# Patient Record
Sex: Male | Born: 1950 | Race: White | Hispanic: No | Marital: Married | Smoking: Former smoker
Health system: Southern US, Community
[De-identification: ages and names within clinical notes are randomized; demographics above are authoritative.]

## PROBLEM LIST (undated history)

## (undated) DIAGNOSIS — F32A Depression, unspecified: Secondary | ICD-10-CM

## (undated) DIAGNOSIS — E785 Hyperlipidemia, unspecified: Secondary | ICD-10-CM

## (undated) DIAGNOSIS — M1990 Unspecified osteoarthritis, unspecified site: Secondary | ICD-10-CM

## (undated) DIAGNOSIS — L570 Actinic keratosis: Secondary | ICD-10-CM

## (undated) DIAGNOSIS — I2510 Atherosclerotic heart disease of native coronary artery without angina pectoris: Secondary | ICD-10-CM

## (undated) DIAGNOSIS — F329 Major depressive disorder, single episode, unspecified: Secondary | ICD-10-CM

## (undated) DIAGNOSIS — C4490 Unspecified malignant neoplasm of skin, unspecified: Secondary | ICD-10-CM

## (undated) HISTORY — DX: Unspecified malignant neoplasm of skin, unspecified: C44.90

## (undated) HISTORY — DX: Hyperlipidemia, unspecified: E78.5

## (undated) HISTORY — PX: CORONARY ANGIOPLASTY: SHX604

## (undated) HISTORY — PX: ANKLE FRACTURE SURGERY: SHX122

## (undated) HISTORY — PX: EYE SURGERY: SHX253

## (undated) HISTORY — DX: Actinic keratosis: L57.0

---

## 2004-09-20 ENCOUNTER — Ambulatory Visit: Payer: Self-pay | Admitting: Orthopedic Surgery

## 2006-05-01 ENCOUNTER — Ambulatory Visit: Payer: Self-pay | Admitting: Cardiovascular Disease

## 2006-05-29 ENCOUNTER — Encounter: Payer: Self-pay | Admitting: Cardiovascular Disease

## 2006-06-17 ENCOUNTER — Encounter: Payer: Self-pay | Admitting: Cardiovascular Disease

## 2006-07-18 ENCOUNTER — Encounter: Payer: Self-pay | Admitting: Cardiovascular Disease

## 2007-12-18 ENCOUNTER — Ambulatory Visit: Payer: Self-pay | Admitting: Gastroenterology

## 2007-12-22 ENCOUNTER — Ambulatory Visit: Payer: Self-pay | Admitting: Gastroenterology

## 2008-01-22 ENCOUNTER — Ambulatory Visit: Payer: Self-pay | Admitting: Orthopaedic Surgery

## 2008-01-29 ENCOUNTER — Ambulatory Visit: Payer: Self-pay | Admitting: Orthopaedic Surgery

## 2008-02-03 ENCOUNTER — Ambulatory Visit: Payer: Self-pay | Admitting: Orthopaedic Surgery

## 2011-11-21 DIAGNOSIS — M1710 Unilateral primary osteoarthritis, unspecified knee: Secondary | ICD-10-CM | POA: Insufficient documentation

## 2011-12-18 DIAGNOSIS — M1710 Unilateral primary osteoarthritis, unspecified knee: Secondary | ICD-10-CM | POA: Insufficient documentation

## 2015-02-18 ENCOUNTER — Other Ambulatory Visit: Payer: Self-pay | Admitting: Urology

## 2015-03-07 DIAGNOSIS — N529 Male erectile dysfunction, unspecified: Secondary | ICD-10-CM | POA: Insufficient documentation

## 2015-04-25 ENCOUNTER — Other Ambulatory Visit: Payer: Self-pay | Admitting: Family Medicine

## 2015-04-26 ENCOUNTER — Encounter: Payer: Self-pay | Admitting: *Deleted

## 2015-04-26 ENCOUNTER — Ambulatory Visit
Admission: RE | Admit: 2015-04-26 | Discharge: 2015-04-26 | Disposition: A | Discharge status: Home / Self Care | Payer: BC Managed Care – PPO | Source: Ambulatory Visit | Attending: Cardiovascular Disease | Admitting: Cardiovascular Disease

## 2015-04-26 ENCOUNTER — Encounter
Admission: RE | Disposition: A | Discharge status: Home / Self Care | Payer: Self-pay | Source: Ambulatory Visit | Attending: Cardiovascular Disease

## 2015-04-26 DIAGNOSIS — Z79899 Other long term (current) drug therapy: Secondary | ICD-10-CM | POA: Diagnosis not present

## 2015-04-26 DIAGNOSIS — I10 Essential (primary) hypertension: Secondary | ICD-10-CM | POA: Diagnosis not present

## 2015-04-26 DIAGNOSIS — Z7902 Long term (current) use of antithrombotics/antiplatelets: Secondary | ICD-10-CM | POA: Insufficient documentation

## 2015-04-26 DIAGNOSIS — I259 Chronic ischemic heart disease, unspecified: Secondary | ICD-10-CM | POA: Diagnosis not present

## 2015-04-26 DIAGNOSIS — Z9889 Other specified postprocedural states: Secondary | ICD-10-CM | POA: Diagnosis not present

## 2015-04-26 DIAGNOSIS — Z791 Long term (current) use of non-steroidal anti-inflammatories (NSAID): Secondary | ICD-10-CM | POA: Insufficient documentation

## 2015-04-26 DIAGNOSIS — E785 Hyperlipidemia, unspecified: Secondary | ICD-10-CM | POA: Insufficient documentation

## 2015-04-26 DIAGNOSIS — Z87891 Personal history of nicotine dependence: Secondary | ICD-10-CM | POA: Diagnosis not present

## 2015-04-26 DIAGNOSIS — Z8249 Family history of ischemic heart disease and other diseases of the circulatory system: Secondary | ICD-10-CM | POA: Diagnosis not present

## 2015-04-26 HISTORY — PX: CARDIAC CATHETERIZATION: SHX172

## 2015-04-26 HISTORY — DX: Major depressive disorder, single episode, unspecified: F32.9

## 2015-04-26 HISTORY — DX: Atherosclerotic heart disease of native coronary artery without angina pectoris: I25.10

## 2015-04-26 HISTORY — DX: Depression, unspecified: F32.A

## 2015-04-26 SURGERY — LEFT HEART CATH
Anesthesia: Moderate Sedation

## 2015-04-26 MED ORDER — FENTANYL CITRATE (PF) 100 MCG/2ML IJ SOLN
INTRAMUSCULAR | Status: DC | PRN
  Administered 2015-04-26 (×2): 50 ug via INTRAVENOUS

## 2015-04-26 MED ORDER — MIDAZOLAM HCL 2 MG/2ML IJ SOLN
INTRAMUSCULAR | Status: AC
  Filled 2015-04-26: qty 2

## 2015-04-26 MED ORDER — SODIUM CHLORIDE 0.9 % IV SOLN: 250.0000 mL | INTRAVENOUS | Status: DC | PRN

## 2015-04-26 MED ORDER — ASPIRIN 81 MG PO CHEW: 81.0000 mg | CHEWABLE_TABLET | ORAL | Status: DC

## 2015-04-26 MED ORDER — MIDAZOLAM HCL 2 MG/2ML IJ SOLN
INTRAMUSCULAR | Status: DC | PRN
  Administered 2015-04-26 (×2): 1 mg via INTRAVENOUS

## 2015-04-26 MED ORDER — HEPARIN (PORCINE) IN NACL 2-0.9 UNIT/ML-% IJ SOLN
INTRAMUSCULAR | Status: AC
  Filled 2015-04-26: qty 1000

## 2015-04-26 MED ORDER — FENTANYL CITRATE (PF) 100 MCG/2ML IJ SOLN
INTRAMUSCULAR | Status: AC
  Filled 2015-04-26: qty 2

## 2015-04-26 MED ORDER — SODIUM CHLORIDE 0.9 % IV SOLN
INTRAVENOUS | Status: DC
Start: 2015-04-26 — End: 2015-04-26
  Administered 2015-04-26: 13:00:00 via INTRAVENOUS

## 2015-04-26 MED ORDER — SODIUM CHLORIDE 0.9 % IJ SOLN: 3.0000 mL | INTRAMUSCULAR | Status: DC | PRN

## 2015-04-26 MED ORDER — IOHEXOL 300 MG/ML  SOLN
INTRAMUSCULAR | Status: DC | PRN
  Administered 2015-04-26: 125 mL via INTRA_ARTERIAL

## 2015-04-26 MED ORDER — LIDOCAINE HCL (PF) 1 % IJ SOLN
INTRAMUSCULAR | Status: DC | PRN
  Administered 2015-04-26: 30 mL

## 2015-04-26 MED ORDER — SODIUM CHLORIDE 0.9 % IJ SOLN: 3.0000 mL | Freq: Two times a day (BID) | INTRAMUSCULAR | Status: DC

## 2015-04-26 SURGICAL SUPPLY — 9 items
CATH INFINITI 5FR ANG PIGTAIL (CATHETERS) ×3 IMPLANT
CATH INFINITI 5FR JL4 (CATHETERS) ×3 IMPLANT
CATH INFINITI JR4 5F (CATHETERS) ×3 IMPLANT
DEVICE CLOSURE MYNXGRIP 5F (Vascular Products) ×3 IMPLANT
KIT MANI 3VAL PERCEP (MISCELLANEOUS) ×3 IMPLANT
NEEDLE PERC 18GX7CM (NEEDLE) ×3 IMPLANT
PACK CARDIAC CATH (CUSTOM PROCEDURE TRAY) ×3 IMPLANT
SHEATH PINNACLE 5F 10CM (SHEATH) ×3 IMPLANT
WIRE EMERALD 3MM-J .035X150CM (WIRE) ×3 IMPLANT

## 2015-04-26 NOTE — Discharge Instructions (Signed)

## 2015-04-27 ENCOUNTER — Encounter: Payer: Self-pay | Admitting: Cardiovascular Disease

## 2015-05-21 ENCOUNTER — Other Ambulatory Visit: Payer: Self-pay | Admitting: Family Medicine

## 2015-05-21 MED ORDER — TESTOSTERONE CYPIONATE 200 MG/ML IM SOLN: 200.0000 mg | INTRAMUSCULAR | Status: DC

## 2015-05-22 ENCOUNTER — Other Ambulatory Visit: Payer: Self-pay | Admitting: Family Medicine

## 2015-05-22 MED ORDER — TESTOSTERONE CYPIONATE 200 MG/ML IM SOLN: 200.0000 mg | INTRAMUSCULAR | Status: DC

## 2015-06-17 ENCOUNTER — Other Ambulatory Visit: Payer: Self-pay | Admitting: Urology

## 2015-07-11 ENCOUNTER — Telehealth: Payer: Self-pay | Admitting: Family Medicine

## 2015-07-11 NOTE — Telephone Encounter (Signed)
Patient requesting lab results from CPE in April

## 2015-07-11 NOTE — Telephone Encounter (Signed)
Pt called stated he needs information for his annual enrollment forms for insurance. Please call him ASAP. Thanks.

## 2015-08-19 ENCOUNTER — Other Ambulatory Visit: Payer: Self-pay | Admitting: Family Medicine

## 2015-09-23 ENCOUNTER — Other Ambulatory Visit: Payer: Self-pay | Admitting: Family Medicine

## 2015-09-23 NOTE — Telephone Encounter (Signed)
apt 

## 2015-10-04 ENCOUNTER — Encounter: Payer: Self-pay | Admitting: Family Medicine

## 2015-10-17 ENCOUNTER — Other Ambulatory Visit: Payer: Self-pay | Admitting: Family Medicine

## 2015-10-17 ENCOUNTER — Other Ambulatory Visit: Payer: Self-pay | Admitting: Urology

## 2015-10-17 DIAGNOSIS — N400 Enlarged prostate without lower urinary tract symptoms: Secondary | ICD-10-CM

## 2015-10-17 NOTE — Telephone Encounter (Signed)
I have left messages for this patient to schedule as well as sent a letter to him on 10/04/15.

## 2015-10-17 NOTE — Telephone Encounter (Signed)
apt 

## 2015-10-17 NOTE — Telephone Encounter (Signed)
Patient is requesting a refill on his dutasteride.  It is okay to refill x 1, but he will need an appointment in March.

## 2015-10-19 NOTE — Telephone Encounter (Signed)
Medication sent to pt pharmacy 

## 2015-10-21 ENCOUNTER — Encounter: Payer: Self-pay | Admitting: Family Medicine

## 2015-11-10 ENCOUNTER — Ambulatory Visit: Payer: Self-pay | Admitting: Family Medicine

## 2015-11-17 ENCOUNTER — Other Ambulatory Visit: Payer: Self-pay | Admitting: Urology

## 2015-11-17 ENCOUNTER — Encounter: Payer: Self-pay | Admitting: Family Medicine

## 2015-11-17 ENCOUNTER — Telehealth: Payer: Self-pay | Admitting: Urology

## 2015-11-17 ENCOUNTER — Ambulatory Visit (INDEPENDENT_AMBULATORY_CARE_PROVIDER_SITE_OTHER): Payer: BC Managed Care – PPO | Admitting: Family Medicine

## 2015-11-17 VITALS — BP 167/92 | HR 75 | Temp 98.6°F | Ht 72.2 in | Wt 230.0 lb

## 2015-11-17 DIAGNOSIS — F329 Major depressive disorder, single episode, unspecified: Secondary | ICD-10-CM

## 2015-11-17 DIAGNOSIS — I2510 Atherosclerotic heart disease of native coronary artery without angina pectoris: Secondary | ICD-10-CM | POA: Insufficient documentation

## 2015-11-17 DIAGNOSIS — Z113 Encounter for screening for infections with a predominantly sexual mode of transmission: Secondary | ICD-10-CM | POA: Diagnosis not present

## 2015-11-17 DIAGNOSIS — E785 Hyperlipidemia, unspecified: Secondary | ICD-10-CM | POA: Insufficient documentation

## 2015-11-17 DIAGNOSIS — E291 Testicular hypofunction: Secondary | ICD-10-CM | POA: Insufficient documentation

## 2015-11-17 DIAGNOSIS — I10 Essential (primary) hypertension: Secondary | ICD-10-CM | POA: Diagnosis not present

## 2015-11-17 DIAGNOSIS — F32A Depression, unspecified: Secondary | ICD-10-CM

## 2015-11-17 LAB — LP+ALT+AST PICCOLO, WAIVED
ALT (SGPT) PICCOLO, WAIVED: 35 U/L (ref 10–47)
AST (SGOT) PICCOLO, WAIVED: 25 U/L (ref 11–38)
Chol/HDL Ratio Piccolo,Waive: 3.7 mg/dL
Cholesterol Piccolo, Waived: 109 mg/dL (ref <200)
HDL Chol Piccolo, Waived: 30 mg/dL — ABNORMAL LOW (ref >59)
LDL Chol Calc Piccolo Waived: 10 mg/dL (ref <100)
TRIGLYCERIDES PICCOLO,WAIVED: 347 mg/dL — AB (ref <150)
VLDL CHOL CALC PICCOLO,WAIVE: 69 mg/dL — AB (ref <30)

## 2015-11-17 MED ORDER — AMLODIPINE BESYLATE 10 MG PO TABS: 10.0000 mg | ORAL_TABLET | Freq: Every day | ORAL | Status: DC

## 2015-11-17 MED ORDER — VARENICLINE TARTRATE 1 MG PO TABS: 1.0000 mg | ORAL_TABLET | Freq: Two times a day (BID) | ORAL | Status: DC

## 2015-11-17 MED ORDER — VARENICLINE TARTRATE 0.5 MG X 11 & 1 MG X 42 PO MISC: ORAL | Status: DC

## 2015-11-17 NOTE — Telephone Encounter (Signed)
Patient will need an office visit for his yearly exam.  He may have enough refills for the Avodart until his appointment.

## 2015-11-17 NOTE — Assessment & Plan Note (Signed)
Discussed poor control hypertension we will increase amlodipine to 10 mg patient education given

## 2015-11-17 NOTE — Assessment & Plan Note (Signed)
The current medical regimen is effective;  continue present plan and medications.  

## 2015-11-17 NOTE — Assessment & Plan Note (Signed)
Followed by urology needing labs drawn which were done today

## 2015-11-17 NOTE — Progress Notes (Signed)
BP 167/92 mmHg  Pulse 75  Temp(Src) 98.6 F (37 C)  Ht 6' 0.2" (1.834 m)  Wt 230 lb (104.327 kg)  BMI 31.02 kg/m2  SpO2 97%   Subjective:    Patient ID: Kenneth Pruitt, male    DOB: 1950/10/01, 65 y.o.   MRN: [id number]  HPI: Kenneth Pruitt is a 65 y.o. male  Chief Complaint  Patient presents with  . Hypertension  . Hyperlipidemia  . Nicotine Dependence   Patient under a great deal of family stress with elevated blood pressure Taking medications faithfully without side effects Cholesterol doing well with no complaints from medications Getting testosterone replacement injections through urology. Needs lab work drawn today for urologist  Relevant past medical, surgical, family and social history reviewed and updated as indicated. Interim medical history since our last visit reviewed. Allergies and medications reviewed and updated.  Review of Systems  Constitutional: Negative.   Respiratory: Negative.   Cardiovascular: Negative.     Per HPI unless specifically indicated above     Objective:    BP 167/92 mmHg  Pulse 75  Temp(Src) 98.6 F (37 C)  Ht 6' 0.2" (1.834 m)  Wt 230 lb (104.327 kg)  BMI 31.02 kg/m2  SpO2 97%  Wt Readings from Last 3 Encounters:  11/17/15 230 lb (104.327 kg)  12/20/14 225 lb (102.059 kg)  04/26/15 215 lb (97.523 kg)    Physical Exam  Constitutional: He is oriented to person, place, and time. He appears well-developed and well-nourished. No distress.  HENT:  Head: Normocephalic and atraumatic.  Right Ear: Hearing normal.  Left Ear: Hearing normal.  Nose: Nose normal.  Eyes: Conjunctivae and lids are normal. Right eye exhibits no discharge. Left eye exhibits no discharge. No scleral icterus.  Cardiovascular: Normal rate, regular rhythm and normal heart sounds.   Pulmonary/Chest: Effort normal and breath sounds normal. No respiratory distress.  Musculoskeletal: Normal range of motion.  Neurological: He is alert and oriented to  person, place, and time.  Skin: Skin is intact. No rash noted.  Psychiatric: He has a normal mood and affect. His speech is normal and behavior is normal. Judgment and thought content normal. Cognition and memory are normal.    Results for orders placed or performed in visit on 11/17/15  LP+ALT+AST Piccolo, [employer]  Result Value Ref Range   ALT (SGPT) Piccolo, Waived 35 10 - 47 U/L   AST (SGOT) Piccolo, Waived 25 11 - 38 U/L   Cholesterol Piccolo, Waived 109 <200 mg/dL   HDL Chol Piccolo, Waived 30 (L) >59 mg/dL   Triglycerides Piccolo,Waived 347 (H) <150 mg/dL   Chol/HDL Ratio Piccolo,Waive 3.7 mg/dL   LDL Chol Calc Piccolo Waived 10 <100 mg/dL   VLDL Chol Calc Piccolo,Waive 69 (H) <30 mg/dL      Assessment & Plan:   Problem List Items Addressed This Visit      Cardiovascular and Mediastinum   Essential hypertension    Discussed poor control hypertension we will increase amlodipine to 10 mg patient education given      Relevant Medications   aspirin EC 81 MG tablet   amLODipine (NORVASC) 10 MG tablet   Coronary artery disease    The current medical regimen is effective;  continue present plan and medications.       Relevant Medications   aspirin EC 81 MG tablet   amLODipine (NORVASC) 10 MG tablet     Endocrine   Hypogonadism in male    Followed by urology  needing labs drawn which were done today      Relevant Orders   CBC with Differential/Platelet   PSA   Testosterone     Other   Hyperlipidemia    The current medical regimen is effective;  continue present plan and medications.       Relevant Medications   aspirin EC 81 MG tablet   amLODipine (NORVASC) 10 MG tablet   Other Relevant Orders   LP+ALT+AST Piccolo, Waived (Completed)   Depression    Stable on current medications will continue       Other Visit Diagnoses    Essential hypertension, benign    -  Primary    Relevant Medications    aspirin EC 81 MG tablet    amLODipine (NORVASC) 10 MG  tablet    Other Relevant Orders    Basic metabolic panel    Routine screening for STI (sexually transmitted infection)        Relevant Orders    Hepatitis C Antibody        Follow up plan: Return in about 2 months (around 01/17/2016) for Physical Exam.

## 2015-11-17 NOTE — Assessment & Plan Note (Signed)
Stable on current medications will continue  

## 2015-11-18 LAB — PSA
PROSTATE SPECIFIC AG, SERUM: 2 ng/mL (ref 0.0–4.0)
Prostate Specific Ag, Serum: 2 ng/mL (ref 0.0–4.0)

## 2015-11-18 LAB — TESTOSTERONE
TESTOSTERONE: 919 ng/dL (ref 348–1197)
Testosterone: 949 ng/dL (ref 348–1197)

## 2015-11-18 LAB — BASIC METABOLIC PANEL
BUN / CREAT RATIO: 17 (ref 10–22)
BUN: 14 mg/dL (ref 8–27)
CHLORIDE: 99 mmol/L (ref 96–106)
CO2: 24 mmol/L (ref 18–29)
Calcium: 9.5 mg/dL (ref 8.6–10.2)
Creatinine, Ser: 0.84 mg/dL (ref 0.76–1.27)
GFR calc non Af Amer: 93 mL/min/{1.73_m2} (ref >59)
GFR, EST AFRICAN AMERICAN: 107 mL/min/{1.73_m2} (ref >59)
Glucose: 97 mg/dL (ref 65–99)
POTASSIUM: 3.9 mmol/L (ref 3.5–5.2)
Sodium: 141 mmol/L (ref 134–144)

## 2015-11-18 LAB — HEPATITIS C ANTIBODY: Hep C Virus Ab: <0.1 s/co ratio (ref 0.0–0.9)

## 2015-11-18 NOTE — Telephone Encounter (Signed)
Spoke with pt in reference to refills of Advodart. Pt stated that he has changed providers but it must have been his pharmacy sending a refill. Reinforced with pt should let his pharmacy know hes changed providers. Pt voiced understanding.

## 2015-11-20 LAB — CBC WITH DIFFERENTIAL/PLATELET

## 2015-11-21 ENCOUNTER — Encounter: Payer: Self-pay | Admitting: Family Medicine

## 2015-12-14 ENCOUNTER — Ambulatory Visit (INDEPENDENT_AMBULATORY_CARE_PROVIDER_SITE_OTHER): Payer: BC Managed Care – PPO | Admitting: Family Medicine

## 2015-12-14 ENCOUNTER — Encounter: Payer: Self-pay | Admitting: Family Medicine

## 2015-12-14 VITALS — BP 160/80 | HR 80 | Temp 98.6°F | Ht 72.6 in | Wt 228.0 lb

## 2015-12-14 DIAGNOSIS — E291 Testicular hypofunction: Secondary | ICD-10-CM

## 2015-12-14 DIAGNOSIS — I10 Essential (primary) hypertension: Secondary | ICD-10-CM | POA: Diagnosis not present

## 2015-12-14 DIAGNOSIS — F329 Major depressive disorder, single episode, unspecified: Secondary | ICD-10-CM | POA: Diagnosis not present

## 2015-12-14 DIAGNOSIS — F32A Depression, unspecified: Secondary | ICD-10-CM

## 2015-12-14 MED ORDER — HYDROCHLOROTHIAZIDE 12.5 MG PO CAPS: 12.5000 mg | ORAL_CAPSULE | Freq: Every day | ORAL | Status: DC

## 2015-12-14 MED ORDER — TESTOSTERONE CYPIONATE 200 MG/ML IM SOLN: INTRAMUSCULAR | Status: DC

## 2015-12-14 NOTE — Progress Notes (Signed)
BP 160/80 mmHg  Pulse 80  Temp(Src) 98.6 F (37 C)  Ht 6' 0.6" (1.844 m)  Wt 228 lb (103.42 kg)  BMI 30.41 kg/m2  SpO2 98%   Subjective:    Patient ID: Kenneth Pruitt, male    DOB: Sep 02, 1951, 65 y.o.   MRN: [id number]  HPI: Kenneth Pruitt is a 65 y.o. male  Chief Complaint  Patient presents with  . low testosterone   patient with long-term low testosterone has been due to insurance hassles going to [facility] for urology care and testosterone prescription blood work is been done here for the urologist. Patient ready to transfer ongoing blood work and testosterone prescription to hear will see urologist once a year for maintenance of testosterone and testosterone prescription. Patient doing well on 0.75 mL of 200 mg/cc injections every 10 days seems to maintain his testosterone well.  Patient under great deal of stress with family issues blood pressure sores at times hasn't been checking at home.   Relevant past medical, surgical, family and social history reviewed and updated as indicated. Interim medical history since our last visit reviewed. Allergies and medications reviewed and updated.  Review of Systems  Constitutional: Negative.   Respiratory: Negative.   Cardiovascular: Negative.     Per HPI unless specifically indicated above     Objective:    BP 160/80 mmHg  Pulse 80  Temp(Src) 98.6 F (37 C)  Ht 6' 0.6" (1.844 m)  Wt 228 lb (103.42 kg)  BMI 30.41 kg/m2  SpO2 98%  Wt Readings from Last 3 Encounters:  12/14/15 228 lb (103.42 kg)  11/17/15 230 lb (104.327 kg)  12/20/14 225 lb (102.059 kg)    Physical Exam  Constitutional: He is oriented to person, place, and time. He appears well-developed and well-nourished. No distress.  HENT:  Head: Normocephalic and atraumatic.  Right Ear: Hearing normal.  Left Ear: Hearing normal.  Nose: Nose normal.  Eyes: Conjunctivae and lids are normal. Right eye exhibits no discharge. Left eye exhibits no discharge. No  scleral icterus.  Cardiovascular: Normal rate, regular rhythm and normal heart sounds.   Pulmonary/Chest: Effort normal and breath sounds normal. No respiratory distress.  Musculoskeletal: Normal range of motion.  Neurological: He is alert and oriented to person, place, and time.  Skin: Skin is intact. No rash noted.  Psychiatric: He has a normal mood and affect. His speech is normal and behavior is normal. Judgment and thought content normal. Cognition and memory are normal.    Results for orders placed or performed in visit on 11/17/15  LP+ALT+AST Piccolo, [employer]  Result Value Ref Range   ALT (SGPT) Piccolo, Waived 35 10 - 47 U/L   AST (SGOT) Piccolo, Waived 25 11 - 38 U/L   Cholesterol Piccolo, Waived 109 <200 mg/dL   HDL Chol Piccolo, Waived 30 (L) >59 mg/dL   Triglycerides Piccolo,Waived 347 (H) <150 mg/dL   Chol/HDL Ratio Piccolo,Waive 3.7 mg/dL   LDL Chol Calc Piccolo Waived 10 <100 mg/dL   VLDL Chol Calc Piccolo,Waive 69 (H) <30 mg/dL  Basic metabolic panel  Result Value Ref Range   Glucose 97 65 - 99 mg/dL   BUN 14 8 - 27 mg/dL   Creatinine, Ser 0.84 0.76 - 1.27 mg/dL   GFR calc non Af Amer 93 >59 mL/min/1.73   GFR calc Af Amer 107 >59 mL/min/1.73   BUN/Creatinine Ratio 17 10 - 22   Sodium 141 134 - 144 mmol/L   Potassium 3.9 3.5 -  5.2 mmol/L   Chloride 99 96 - 106 mmol/L   CO2 24 18 - 29 mmol/L   Calcium 9.5 8.6 - 10.2 mg/dL  Hepatitis C Antibody  Result Value Ref Range   Hep C Virus Ab <0.1 0.0 - 0.9 s/co ratio  CBC with Differential/Platelet  Result Value Ref Range   WBC CANCELED x10E3/uL   RBC CANCELED    Hemoglobin CANCELED    Hematocrit CANCELED    Platelets CANCELED    Neutrophils CANCELED    Lymphs CANCELED    Monocytes CANCELED    Eos CANCELED    Lymphocytes Absolute CANCELED    EOS (ABSOLUTE) CANCELED    Basophils Absolute CANCELED   PSA  Result Value Ref Range   Prostate Specific Ag, Serum 2.0 0.0 - 4.0 ng/mL  Testosterone  Result Value Ref  Range   Testosterone 919 348 - 1197 ng/dL   Comment, Testosterone Comment   Testosterone  Result Value Ref Range   Testosterone 949 348 - 1197 ng/dL   Comment, Testosterone Comment   Hepatitis C antibody  Result Value Ref Range   Hep C Virus Ab <0.1 0.0 - 0.9 s/co ratio  PSA  Result Value Ref Range   Prostate Specific Ag, Serum 2.0 0.0 - 4.0 ng/mL      Assessment & Plan:   Problem List Items Addressed This Visit      Cardiovascular and Mediastinum   Essential hypertension - Primary    Poor control will add hydrochlorothiazide 12.5 mg 1 a day checking blood pressure if not better will need to go to 25 mg as patient loses weight and begin to exercise program will hopefully be able to reduce dosing.      Relevant Medications   hydrochlorothiazide (MICROZIDE) 12.5 MG capsule     Endocrine   Hypogonadism in male    We'll continue testosterone replacement therapy checking labs CBC wasn't done for whatever reason a couple weeks ago will do CBC at patient's physical coming up in a few weeks.        Other   Depression    The current medical regimen is effective;  continue present plan and medications.           Follow up plan: Return for Physical Exam, As scheduled.

## 2015-12-14 NOTE — Assessment & Plan Note (Signed)
We'll continue testosterone replacement therapy checking labs CBC wasn't done for whatever reason a couple weeks ago will do CBC at patient's physical coming up in a few weeks.

## 2015-12-14 NOTE — Assessment & Plan Note (Signed)
The current medical regimen is effective;  continue present plan and medications.  

## 2015-12-14 NOTE — Assessment & Plan Note (Signed)
Poor control will add hydrochlorothiazide 12.5 mg 1 a day checking blood pressure if not better will need to go to 25 mg as patient loses weight and begin to exercise program will hopefully be able to reduce dosing.

## 2015-12-15 ENCOUNTER — Other Ambulatory Visit: Payer: Self-pay | Admitting: Family Medicine

## 2015-12-19 ENCOUNTER — Other Ambulatory Visit: Payer: Self-pay | Admitting: Family Medicine

## 2015-12-21 ENCOUNTER — Other Ambulatory Visit: Payer: Self-pay | Admitting: Family Medicine

## 2016-02-02 ENCOUNTER — Ambulatory Visit (INDEPENDENT_AMBULATORY_CARE_PROVIDER_SITE_OTHER): Payer: BC Managed Care – PPO | Admitting: Family Medicine

## 2016-02-02 ENCOUNTER — Encounter: Payer: Self-pay | Admitting: Family Medicine

## 2016-02-02 ENCOUNTER — Other Ambulatory Visit: Payer: Self-pay | Admitting: Family Medicine

## 2016-02-02 VITALS — BP 142/80 | HR 72 | Temp 98.0°F | Ht 71.0 in | Wt 226.2 lb

## 2016-02-02 DIAGNOSIS — E291 Testicular hypofunction: Secondary | ICD-10-CM | POA: Diagnosis not present

## 2016-02-02 DIAGNOSIS — E785 Hyperlipidemia, unspecified: Secondary | ICD-10-CM

## 2016-02-02 DIAGNOSIS — I2510 Atherosclerotic heart disease of native coronary artery without angina pectoris: Secondary | ICD-10-CM

## 2016-02-02 DIAGNOSIS — Z0000 Encounter for general adult medical examination without abnormal findings: Secondary | ICD-10-CM | POA: Diagnosis not present

## 2016-02-02 DIAGNOSIS — I10 Essential (primary) hypertension: Secondary | ICD-10-CM | POA: Diagnosis not present

## 2016-02-02 LAB — LP+ALT+AST PICCOLO, WAIVED
ALT (SGPT) PICCOLO, WAIVED: 33 U/L (ref 10–47)
AST (SGOT) PICCOLO, WAIVED: 33 U/L (ref 11–38)
CHOL/HDL RATIO PICCOLO,WAIVE: 3.5 mg/dL
CHOLESTEROL PICCOLO, WAIVED: 117 mg/dL (ref <200)
HDL Chol Piccolo, Waived: 33 mg/dL — ABNORMAL LOW (ref >59)
LDL Chol Calc Piccolo Waived: 53 mg/dL (ref <100)
Triglycerides Piccolo,Waived: 156 mg/dL — ABNORMAL HIGH (ref <150)
VLDL Chol Calc Piccolo,Waive: 31 mg/dL — ABNORMAL HIGH (ref <30)

## 2016-02-02 LAB — URINALYSIS, ROUTINE W REFLEX MICROSCOPIC
BILIRUBIN UA: NEGATIVE
GLUCOSE, UA: NEGATIVE
KETONES UA: NEGATIVE
Leukocytes, UA: NEGATIVE
NITRITE UA: NEGATIVE
RBC UA: NEGATIVE
Specific Gravity, UA: 1.015 (ref 1.005–1.030)
UUROB: 0.2 mg/dL (ref 0.2–1.0)
pH, UA: 6.5 (ref 5.0–7.5)

## 2016-02-02 MED ORDER — BENAZEPRIL HCL 40 MG PO TABS: 40.0000 mg | ORAL_TABLET | Freq: Every day | ORAL | Status: DC

## 2016-02-02 MED ORDER — VENLAFAXINE HCL ER 150 MG PO CP24: 150.0000 mg | ORAL_CAPSULE | Freq: Every day | ORAL | Status: DC

## 2016-02-02 MED ORDER — DUTASTERIDE 0.5 MG PO CAPS: 0.5000 mg | ORAL_CAPSULE | Freq: Every day | ORAL | Status: DC

## 2016-02-02 MED ORDER — TAMSULOSIN HCL 0.4 MG PO CAPS: 0.4000 mg | ORAL_CAPSULE | Freq: Every day | ORAL | Status: DC

## 2016-02-02 MED ORDER — ATORVASTATIN CALCIUM 10 MG PO TABS: 10.0000 mg | ORAL_TABLET | Freq: Every day | ORAL | Status: DC

## 2016-02-02 MED ORDER — MELOXICAM 15 MG PO TABS
15.0000 mg | ORAL_TABLET | Freq: Every day | ORAL | Status: DC
Start: 2016-02-02 — End: 2016-09-08

## 2016-02-02 MED ORDER — NIACIN ER (ANTIHYPERLIPIDEMIC) 1000 MG PO TBCR: 2000.0000 mg | EXTENDED_RELEASE_TABLET | Freq: Every day | ORAL | Status: DC

## 2016-02-02 MED ORDER — HYDROCHLOROTHIAZIDE 12.5 MG PO CAPS: 12.5000 mg | ORAL_CAPSULE | Freq: Every day | ORAL | Status: DC

## 2016-02-02 MED ORDER — HYDROCORTISONE 2.5 % RE CREA
1.0000 | TOPICAL_CREAM | Freq: Two times a day (BID) | RECTAL | Status: DC
Start: 2016-02-02 — End: 2016-08-14

## 2016-02-02 MED ORDER — AMLODIPINE BESYLATE 10 MG PO TABS: 10.0000 mg | ORAL_TABLET | Freq: Every day | ORAL | Status: DC

## 2016-02-02 NOTE — Progress Notes (Signed)
BP 142/80 mmHg  Pulse 72  Temp(Src) 98 F (36.7 C)  Ht 5\' 11"  (1.803 m)  Wt 226 lb 3.2 oz (102.604 kg)  BMI 31.56 kg/m2  SpO2 96%   Subjective:    Patient ID: Kenneth Pruitt, male    DOB: Mar 10, 1951, 65 y.o.   MRN: [id number]  HPI: Kenneth Pruitt is a 65 y.o. male  Chief Complaint  Patient presents with  . Annual Exam   Patient doing well with medications blood pressure may be some low blood pressure changes after exertion. No coronary symptoms as very physically active and does well.  Has quit cigars and doing well with Chantix. Nerves doing okay but a lot of stress with caregiver role of his mother. Testosterone replacement doing well. Giving three quarters of a cc every 10 days. Patient having still highs and lows on about day 8 starts feeling low has bad dreams. After shot on day 10 after 24 hours feels back to normal. Followed by urology and is due to his appointment.  Relevant past medical, surgical, family and social history reviewed and updated as indicated. Interim medical history since our last visit reviewed. Allergies and medications reviewed and updated.  Review of Systems  Constitutional: Negative.   HENT: Negative.   Eyes: Negative.   Respiratory: Negative.   Cardiovascular: Negative.   Gastrointestinal: Negative.   Endocrine: Negative.   Genitourinary: Negative.   Musculoskeletal: Negative.   Skin: Negative.   Allergic/Immunologic: Negative.   Neurological: Negative.   Hematological: Negative.   Psychiatric/Behavioral: Negative.     Per HPI unless specifically indicated above     Objective:    BP 142/80 mmHg  Pulse 72  Temp(Src) 98 F (36.7 C)  Ht 5\' 11"  (1.803 m)  Wt 226 lb 3.2 oz (102.604 kg)  BMI 31.56 kg/m2  SpO2 96%  Wt Readings from Last 3 Encounters:  02/02/16 226 lb 3.2 oz (102.604 kg)  12/14/15 228 lb (103.42 kg)  11/17/15 230 lb (104.327 kg)    Physical Exam  Constitutional: He is oriented to person, place, and time. He  appears well-developed and well-nourished.  HENT:  Head: Normocephalic and atraumatic.  Right Ear: External ear normal.  Left Ear: External ear normal.  Nose: Nose normal.  Eyes: Conjunctivae and EOM are normal. Pupils are equal, round, and reactive to light.  Neck: Normal range of motion. Neck supple. No thyromegaly present.  Cardiovascular: Normal rate, regular rhythm, normal heart sounds and intact distal pulses.   Pulmonary/Chest: Effort normal and breath sounds normal.  Abdominal: Soft. Bowel sounds are normal. There is no splenomegaly or hepatomegaly.  Musculoskeletal: Normal range of motion.  Lymphadenopathy:    He has no cervical adenopathy.  Neurological: He is alert and oriented to person, place, and time. He has normal reflexes.  Skin: Skin is warm and dry. No rash noted. No erythema.  Psychiatric: He has a normal mood and affect. His behavior is normal. Judgment and thought content normal.    Results for orders placed or performed in visit on 11/17/15  LP+ALT+AST Piccolo, [employer]  Result Value Ref Range   ALT (SGPT) Piccolo, Waived 35 10 - 47 U/L   AST (SGOT) Piccolo, Waived 25 11 - 38 U/L   Cholesterol Piccolo, Waived 109 <200 mg/dL   HDL Chol Piccolo, Waived 30 (L) >59 mg/dL   Triglycerides Piccolo,Waived 347 (H) <150 mg/dL   Chol/HDL Ratio Piccolo,Waive 3.7 mg/dL   LDL Chol Calc Piccolo Waived 10 <100 mg/dL  VLDL Chol Calc Piccolo,Waive 69 (H) <30 mg/dL  Basic metabolic panel  Result Value Ref Range   Glucose 97 65 - 99 mg/dL   BUN 14 8 - 27 mg/dL   Creatinine, Ser 0.84 0.76 - 1.27 mg/dL   GFR calc non Af Amer 93 >59 mL/min/1.73   GFR calc Af Amer 107 >59 mL/min/1.73   BUN/Creatinine Ratio 17 10 - 22   Sodium 141 134 - 144 mmol/L   Potassium 3.9 3.5 - 5.2 mmol/L   Chloride 99 96 - 106 mmol/L   CO2 24 18 - 29 mmol/L   Calcium 9.5 8.6 - 10.2 mg/dL  Hepatitis C Antibody  Result Value Ref Range   Hep C Virus Ab <0.1 0.0 - 0.9 s/co ratio  CBC with  Differential/Platelet  Result Value Ref Range   WBC CANCELED x10E3/uL   RBC CANCELED    Hemoglobin CANCELED    Hematocrit CANCELED    Platelets CANCELED    Neutrophils CANCELED    Lymphs CANCELED    Monocytes CANCELED    Eos CANCELED    Lymphocytes Absolute CANCELED    EOS (ABSOLUTE) CANCELED    Basophils Absolute CANCELED   PSA  Result Value Ref Range   Prostate Specific Ag, Serum 2.0 0.0 - 4.0 ng/mL  Testosterone  Result Value Ref Range   Testosterone 919 348 - 1197 ng/dL   Comment, Testosterone Comment   Testosterone  Result Value Ref Range   Testosterone 949 348 - 1197 ng/dL   Comment, Testosterone Comment   Hepatitis C antibody  Result Value Ref Range   Hep C Virus Ab <0.1 0.0 - 0.9 s/co ratio  PSA  Result Value Ref Range   Prostate Specific Ag, Serum 2.0 0.0 - 4.0 ng/mL      Assessment & Plan:   Problem List Items Addressed This Visit      Cardiovascular and Mediastinum   Coronary artery disease    Doing well no symptoms followed by cardiology      Relevant Medications   amLODipine (NORVASC) 10 MG tablet   atorvastatin (LIPITOR) 10 MG tablet   benazepril (LOTENSIN) 40 MG tablet   hydrochlorothiazide (MICROZIDE) 12.5 MG capsule   niacin (NIASPAN) 1000 MG CR tablet   Essential hypertension    Discussed patient hadn't taking blood pressure medicine today but getting low will drop amlodipine from 10-5 mg monitoring blood pressure.      Relevant Medications   amLODipine (NORVASC) 10 MG tablet   atorvastatin (LIPITOR) 10 MG tablet   benazepril (LOTENSIN) 40 MG tablet   hydrochlorothiazide (MICROZIDE) 12.5 MG capsule   niacin (NIASPAN) 1000 MG CR tablet     Endocrine   Hypogonadism in male    Discuss testosterone replacement may be shortening interval will do peak and trough levels on current dose and then reassess patient had testosterone shot approximate 24 hours or go we'll check testosterone today.      Relevant Orders   Testosterone     Other    Hyperlipidemia    The current medical regimen is effective;  continue present plan and medications.       Relevant Medications   amLODipine (NORVASC) 10 MG tablet   atorvastatin (LIPITOR) 10 MG tablet   benazepril (LOTENSIN) 40 MG tablet   hydrochlorothiazide (MICROZIDE) 12.5 MG capsule   niacin (NIASPAN) 1000 MG CR tablet    Other Visit Diagnoses    Routine general medical examination at a health care facility    -  Primary    Relevant Orders    CBC with Differential/Platelet    Comprehensive metabolic panel    TSH    Testosterone    PSA    Urinalysis, Routine w reflex microscopic (not at [facility])    [facility], Waived        Follow up plan: Return in about 6 months (around 08/04/2016) for bmp, lipids, alt, ast.

## 2016-02-02 NOTE — Assessment & Plan Note (Signed)
Doing well no symptoms followed by cardiology

## 2016-02-02 NOTE — Assessment & Plan Note (Signed)
Discuss testosterone replacement may be shortening interval will do peak and trough levels on current dose and then reassess patient had testosterone shot approximate 24 hours or go we'll check testosterone today.

## 2016-02-02 NOTE — Assessment & Plan Note (Signed)
Discussed patient hadn't taking blood pressure medicine today but getting low will drop amlodipine from 10-5 mg monitoring blood pressure.

## 2016-02-02 NOTE — Addendum Note (Signed)
Addended byGolden Pop on: 02/02/2016 10:15 AM   Modules accepted: Orders, SmartSet

## 2016-02-02 NOTE — Assessment & Plan Note (Signed)
The current medical regimen is effective;  continue present plan and medications.  

## 2016-02-03 LAB — CBC WITH DIFFERENTIAL/PLATELET
BASOS: 1 %
Basophils Absolute: 0 10*3/uL (ref 0.0–0.2)
EOS (ABSOLUTE): 0.2 10*3/uL (ref 0.0–0.4)
Eos: 4 %
HEMATOCRIT: 51 % (ref 37.5–51.0)
HEMOGLOBIN: 17.7 g/dL (ref 12.6–17.7)
IMMATURE GRANS (ABS): 0 10*3/uL (ref 0.0–0.1)
IMMATURE GRANULOCYTES: 0 %
LYMPHS: 31 %
Lymphocytes Absolute: 1.7 10*3/uL (ref 0.7–3.1)
MCH: 32.8 pg (ref 26.6–33.0)
MCHC: 34.7 g/dL (ref 31.5–35.7)
MCV: 94 fL (ref 79–97)
MONOCYTES: 9 %
Monocytes Absolute: 0.5 10*3/uL (ref 0.1–0.9)
NEUTROS ABS: 3 10*3/uL (ref 1.4–7.0)
NEUTROS PCT: 55 %
Platelets: 231 10*3/uL (ref 150–379)
RBC: 5.4 x10E6/uL (ref 4.14–5.80)
RDW: 13.4 % (ref 12.3–15.4)
WBC: 5.4 10*3/uL (ref 3.4–10.8)

## 2016-02-03 LAB — COMPREHENSIVE METABOLIC PANEL
A/G RATIO: 2.3 — AB (ref 1.2–2.2)
ALT: 21 IU/L (ref 0–44)
AST: 21 IU/L (ref 0–40)
Albumin: 4.3 g/dL (ref 3.6–4.8)
Alkaline Phosphatase: 49 IU/L (ref 39–117)
BUN/Creatinine Ratio: 27 — ABNORMAL HIGH (ref 10–24)
BUN: 25 mg/dL (ref 8–27)
Bilirubin Total: 0.8 mg/dL (ref 0.0–1.2)
CALCIUM: 8.9 mg/dL (ref 8.6–10.2)
CO2: 23 mmol/L (ref 18–29)
CREATININE: 0.94 mg/dL (ref 0.76–1.27)
Chloride: 101 mmol/L (ref 96–106)
GFR, EST AFRICAN AMERICAN: 99 mL/min/{1.73_m2} (ref >59)
GFR, EST NON AFRICAN AMERICAN: 85 mL/min/{1.73_m2} (ref >59)
Globulin, Total: 1.9 g/dL (ref 1.5–4.5)
Glucose: 112 mg/dL — ABNORMAL HIGH (ref 65–99)
POTASSIUM: 4 mmol/L (ref 3.5–5.2)
SODIUM: 140 mmol/L (ref 134–144)
TOTAL PROTEIN: 6.2 g/dL (ref 6.0–8.5)

## 2016-02-03 LAB — TESTOSTERONE: Testosterone: 1017 ng/dL (ref 348–1197)

## 2016-02-03 LAB — TSH: TSH: 1.27 u[IU]/mL (ref 0.450–4.500)

## 2016-02-03 LAB — PSA: Prostate Specific Ag, Serum: 2.2 ng/mL (ref 0.0–4.0)

## 2016-02-20 ENCOUNTER — Other Ambulatory Visit: Payer: Self-pay | Admitting: Family Medicine

## 2016-04-23 ENCOUNTER — Other Ambulatory Visit: Payer: Self-pay | Admitting: Family Medicine

## 2016-05-05 ENCOUNTER — Other Ambulatory Visit: Payer: Self-pay | Admitting: Family Medicine

## 2016-05-07 DIAGNOSIS — E785 Hyperlipidemia, unspecified: Secondary | ICD-10-CM | POA: Diagnosis not present

## 2016-05-07 DIAGNOSIS — I2510 Atherosclerotic heart disease of native coronary artery without angina pectoris: Secondary | ICD-10-CM | POA: Diagnosis not present

## 2016-05-07 DIAGNOSIS — Z955 Presence of coronary angioplasty implant and graft: Secondary | ICD-10-CM | POA: Diagnosis not present

## 2016-05-07 DIAGNOSIS — I10 Essential (primary) hypertension: Secondary | ICD-10-CM | POA: Diagnosis not present

## 2016-06-06 DIAGNOSIS — E785 Hyperlipidemia, unspecified: Secondary | ICD-10-CM | POA: Diagnosis not present

## 2016-06-18 ENCOUNTER — Other Ambulatory Visit: Payer: Self-pay

## 2016-06-18 MED ORDER — TESTOSTERONE CYPIONATE 200 MG/ML IM SOLN: INTRAMUSCULAR | 2 refills | Status: DC

## 2016-07-24 DIAGNOSIS — Z23 Encounter for immunization: Secondary | ICD-10-CM | POA: Diagnosis not present

## 2016-08-06 ENCOUNTER — Ambulatory Visit (INDEPENDENT_AMBULATORY_CARE_PROVIDER_SITE_OTHER): Payer: Medicare Other | Admitting: Family Medicine

## 2016-08-06 ENCOUNTER — Encounter: Payer: Self-pay | Admitting: Family Medicine

## 2016-08-06 VITALS — BP 169/89 | HR 80 | Temp 98.7°F | Wt 224.0 lb

## 2016-08-06 DIAGNOSIS — F3342 Major depressive disorder, recurrent, in full remission: Secondary | ICD-10-CM | POA: Diagnosis not present

## 2016-08-06 DIAGNOSIS — E291 Testicular hypofunction: Secondary | ICD-10-CM | POA: Diagnosis not present

## 2016-08-06 DIAGNOSIS — R972 Elevated prostate specific antigen [PSA]: Secondary | ICD-10-CM

## 2016-08-06 DIAGNOSIS — E782 Mixed hyperlipidemia: Secondary | ICD-10-CM | POA: Diagnosis not present

## 2016-08-06 DIAGNOSIS — I10 Essential (primary) hypertension: Secondary | ICD-10-CM

## 2016-08-06 DIAGNOSIS — I2510 Atherosclerotic heart disease of native coronary artery without angina pectoris: Secondary | ICD-10-CM

## 2016-08-06 DIAGNOSIS — M25561 Pain in right knee: Secondary | ICD-10-CM | POA: Diagnosis not present

## 2016-08-06 DIAGNOSIS — G8929 Other chronic pain: Secondary | ICD-10-CM | POA: Diagnosis not present

## 2016-08-06 LAB — LP+ALT+AST PICCOLO, WAIVED
ALT (SGPT) Piccolo, Waived: 23 U/L (ref 10–47)
AST (SGOT) Piccolo, Waived: 28 U/L (ref 11–38)
Chol/HDL Ratio Piccolo,Waive: 2.8 mg/dL
Cholesterol Piccolo, Waived: 115 mg/dL (ref <200)
HDL CHOL PICCOLO, WAIVED: 41 mg/dL — AB (ref >59)
LDL CHOL CALC PICCOLO WAIVED: 36 mg/dL (ref <100)
TRIGLYCERIDES PICCOLO,WAIVED: 188 mg/dL — AB (ref <150)
VLDL CHOL CALC PICCOLO,WAIVE: 38 mg/dL — AB (ref <30)

## 2016-08-06 NOTE — Assessment & Plan Note (Signed)
The current medical regimen is effective;  continue present plan and medications.  

## 2016-08-06 NOTE — Assessment & Plan Note (Signed)
Poor control will increase amlodipine patient was to take 5 mg twice a day and restart hydrochlorothiazide 12.5 on after blood pressure and notify me if not doing well.

## 2016-08-06 NOTE — Assessment & Plan Note (Signed)
Patient with arthroscopic surgery on his right with continued wobble and pain wants to go back and recheck with orthopedic surgery will make an appointment.

## 2016-08-06 NOTE — Assessment & Plan Note (Signed)
Continue current dosing doing well will check PSA and CBC

## 2016-08-06 NOTE — Progress Notes (Addendum)
BP (!) 169/89   Pulse 80   Temp 98.7 F (37.1 C)   Wt 224 lb (101.6 kg)   SpO2 97%   BMI 31.24 kg/m    Subjective:    Patient ID: Kenneth Pruitt, male    DOB: 10-16-1950, 65 y.o.   MRN: [id number]  HPI: Kenneth Pruitt is a 65 y.o. male  Chief Complaint  Patient presents with  . Hypertension  . Hyperlipidemia  Patient follow-up blood pressure has been taking amlodipine half a tablet 5 mg a day and no hydrochlorothiazide is during the summer his bike riding and sweating a lot was getting too low. Now with white running season over and not sweating blood pressures gone up. 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 Interim medical history since our last visit reviewed. Allergies and medications reviewed and updated.  Review of Systems  Constitutional: Negative.   Respiratory: Negative.   Cardiovascular: Negative.     Per HPI unless specifically indicated above     Objective:    BP (!) 169/89   Pulse 80   Temp 98.7 F (37.1 C)   Wt 224 lb (101.6 kg)   SpO2 97%   BMI 31.24 kg/m   Wt Readings from Last 3 Encounters:  08/06/16 224 lb (101.6 kg)  02/02/16 226 lb 3.2 oz (102.6 kg)  12/14/15 228 lb (103.4 kg)    Physical Exam  Constitutional: He is oriented to person, place, and time. He appears well-developed and well-nourished. No distress.  HENT:  Head: Normocephalic and atraumatic.  Right Ear: Hearing normal.  Left Ear: Hearing normal.  Nose: Nose normal.  Eyes: Conjunctivae and lids are normal. Right eye exhibits no discharge. Left eye exhibits no discharge. No scleral icterus.  Cardiovascular: Normal rate, regular rhythm and normal heart sounds.   Pulmonary/Chest: Effort normal and breath sounds normal. No respiratory distress.  Musculoskeletal: Normal range of motion.    Neurological: He is alert and oriented to person, place, and time.  Skin: Skin is intact. No rash noted.  Psychiatric: He has a normal mood and affect. His speech is normal and behavior is normal. Judgment and thought content normal. Cognition and memory are normal.    Results for orders placed or performed in visit on 02/02/16  CBC with Differential/Platelet  Result Value Ref Range   WBC 5.4 3.4 - 10.8 x10E3/uL   RBC 5.40 4.14 - 5.80 x10E6/uL   Hemoglobin 17.7 12.6 - 17.7 g/dL   Hematocrit 51.0 37.5 - 51.0 %   MCV 94 79 - 97 fL   MCH 32.8 26.6 - 33.0 pg   MCHC 34.7 31.5 - 35.7 g/dL   RDW 13.4 12.3 - 15.4 %   Platelets 231 150 - 379 x10E3/uL   Neutrophils 55 %   Lymphs 31 %   Monocytes 9 %   Eos 4 %   Basos 1 %   Neutrophils Absolute 3.0 1.4 - 7.0 x10E3/uL   Lymphocytes Absolute 1.7 0.7 - 3.1 x10E3/uL   Monocytes Absolute 0.5 0.1 - 0.9 x10E3/uL   EOS (ABSOLUTE) 0.2 0.0 - 0.4 x10E3/uL   Basophils Absolute 0.0 0.0 - 0.2 x10E3/uL   Immature Granulocytes 0 %   Immature Grans (Abs) 0.0 0.0 - 0.1 x10E3/uL  Comprehensive metabolic panel  Result Value Ref Range   Glucose 112 (H) 65 - 99 mg/dL  BUN 25 8 - 27 mg/dL   Creatinine, Ser 0.94 0.76 - 1.27 mg/dL   GFR calc non Af Amer 85 >59 mL/min/1.73   GFR calc Af Amer 99 >59 mL/min/1.73   BUN/Creatinine Ratio 27 (H) 10 - 24   Sodium 140 134 - 144 mmol/L   Potassium 4.0 3.5 - 5.2 mmol/L   Chloride 101 96 - 106 mmol/L   CO2 23 18 - 29 mmol/L   Calcium 8.9 8.6 - 10.2 mg/dL   Total Protein 6.2 6.0 - 8.5 g/dL   Albumin 4.3 3.6 - 4.8 g/dL   Globulin, Total 1.9 1.5 - 4.5 g/dL   Albumin/Globulin Ratio 2.3 (H) 1.2 - 2.2   Bilirubin Total 0.8 0.0 - 1.2 mg/dL   Alkaline Phosphatase 49 39 - 117 IU/L   AST 21 0 - 40 IU/L   ALT 21 0 - 44 IU/L  TSH  Result Value Ref Range   TSH 1.270 0.450 - 4.500 uIU/mL  Testosterone  Result Value Ref Range   Testosterone 1,017 348 - 1,197 ng/dL   Comment, Testosterone Comment   PSA  Result Value  Ref Range   Prostate Specific Ag, Serum 2.2 0.0 - 4.0 ng/mL  Urinalysis, Routine w reflex microscopic (not at [facility])  Result Value Ref Range   Specific Gravity, UA 1.015 1.005 - 1.030   pH, UA 6.5 5.0 - 7.5   Color, UA Yellow Yellow   Appearance Ur Clear Clear   Leukocytes, UA Negative Negative   Protein, UA Trace Negative/Trace   Glucose, UA Negative Negative   Ketones, UA Negative Negative   RBC, UA Negative Negative   Bilirubin, UA Negative Negative   Urobilinogen, Ur 0.2 0.2 - 1.0 mg/dL   Nitrite, UA Negative Negative  LP+ALT+AST Piccolo, Waived  Result Value Ref Range   ALT (SGPT) Piccolo, Waived 33 10 - 47 U/L   AST (SGOT) Piccolo, Waived 33 11 - 38 U/L   Cholesterol Piccolo, Waived 117 <200 mg/dL   HDL Chol Piccolo, Waived 33 (L) >59 mg/dL   Triglycerides Piccolo,Waived 156 (H) <150 mg/dL   Chol/HDL Ratio Piccolo,Waive 3.5 mg/dL   LDL Chol Calc Piccolo Waived 53 <100 mg/dL   VLDL Chol Calc Piccolo,Waive 31 (H) <30 mg/dL      Assessment & Plan:   Problem List Items Addressed This Visit      Cardiovascular and Mediastinum   Essential hypertension - Primary    Poor control will increase amlodipine patient was to take 5 mg twice a day and restart hydrochlorothiazide 12.5 on after blood pressure and notify me if not doing well.      Relevant Medications   rosuvastatin (CRESTOR) 10 MG tablet   benazepril (LOTENSIN) 40 MG tablet   Other Relevant Orders   Basic metabolic panel   CBC with Differential/Platelet     Endocrine   Hypogonadism in male    Continue current dosing doing well will check PSA and CBC      Relevant Orders   PSA   CBC with Differential/Platelet     Other   Depression    The current medical regimen is effective;  continue present plan and medications.       Hyperlipidemia    The current medical regimen is effective;  continue present plan and medications.       Relevant Medications   rosuvastatin (CRESTOR) 10 MG tablet   benazepril  (LOTENSIN) 40 MG tablet   Other Relevant Orders   LP+ALT+AST Piccolo, Waived  Knee pain, right    Patient with arthroscopic surgery on his right with continued wobble and pain wants to go back and recheck with orthopedic surgery will make an appointment.      Relevant Orders   Ambulatory referral to Orthopedic Surgery   Elevated PSA    Monitoring PSA because of on testosterone      Relevant Orders   PSA       Follow up plan: Return in about 6 months (around 02/03/2017) for Physical Exam.

## 2016-08-06 NOTE — Assessment & Plan Note (Signed)
Monitoring PSA because of on testosterone

## 2016-08-07 ENCOUNTER — Telehealth: Payer: Self-pay | Admitting: Family Medicine

## 2016-08-07 DIAGNOSIS — R718 Other abnormality of red blood cells: Secondary | ICD-10-CM | POA: Insufficient documentation

## 2016-08-07 LAB — BASIC METABOLIC PANEL
BUN / CREAT RATIO: 15 (ref 10–24)
BUN: 16 mg/dL (ref 8–27)
CO2: 22 mmol/L (ref 18–29)
CREATININE: 1.04 mg/dL (ref 0.76–1.27)
Calcium: 9.1 mg/dL (ref 8.6–10.2)
Chloride: 100 mmol/L (ref 96–106)
GFR calc non Af Amer: 75 mL/min/{1.73_m2} (ref >59)
GFR, EST AFRICAN AMERICAN: 87 mL/min/{1.73_m2} (ref >59)
Glucose: 130 mg/dL — ABNORMAL HIGH (ref 65–99)
Potassium: 3.9 mmol/L (ref 3.5–5.2)
Sodium: 140 mmol/L (ref 134–144)

## 2016-08-07 LAB — CBC WITH DIFFERENTIAL/PLATELET
BASOS: 1 %
Basophils Absolute: 0 10*3/uL (ref 0.0–0.2)
EOS (ABSOLUTE): 0.2 10*3/uL (ref 0.0–0.4)
Eos: 4 %
HEMOGLOBIN: 18.2 g/dL — AB (ref 12.6–17.7)
Hematocrit: 51.2 % — ABNORMAL HIGH (ref 37.5–51.0)
IMMATURE GRANS (ABS): 0 10*3/uL (ref 0.0–0.1)
Immature Granulocytes: 0 %
LYMPHS: 28 %
Lymphocytes Absolute: 1.4 10*3/uL (ref 0.7–3.1)
MCH: 32.5 pg (ref 26.6–33.0)
MCHC: 35.5 g/dL (ref 31.5–35.7)
MCV: 91 fL (ref 79–97)
MONOCYTES: 7 %
Monocytes Absolute: 0.3 10*3/uL (ref 0.1–0.9)
NEUTROS ABS: 3 10*3/uL (ref 1.4–7.0)
Neutrophils: 60 %
Platelets: 189 10*3/uL (ref 150–379)
RBC: 5.6 x10E6/uL (ref 4.14–5.80)
RDW: 13.4 % (ref 12.3–15.4)
WBC: 5 10*3/uL (ref 3.4–10.8)

## 2016-08-07 LAB — PSA: Prostate Specific Ag, Serum: 2 ng/mL (ref 0.0–4.0)

## 2016-08-07 NOTE — Assessment & Plan Note (Signed)
Secondary to testosterone replacement

## 2016-08-07 NOTE — Telephone Encounter (Signed)
Phone call Discussed with patient elevated hemoglobin area did reviewed reducing testosterone dosing from 0.75 mg every 10 days to 0.5 mg every 10 days will check peak and trough testosterone in 1 month or so by checking testosterone to 2 days after shot and then the day before getting his shot.  We'll also refer to hematology to further evaluate this patient relates he was donating blood to control his hemoglobin previousl but after starting [employer] did not want his blood anymore.

## 2016-08-14 ENCOUNTER — Other Ambulatory Visit: Payer: Self-pay | Admitting: Family Medicine

## 2016-08-14 NOTE — Telephone Encounter (Signed)
Routing to provider  

## 2016-08-23 DIAGNOSIS — M25462 Effusion, left knee: Secondary | ICD-10-CM | POA: Diagnosis not present

## 2016-08-23 DIAGNOSIS — M1710 Unilateral primary osteoarthritis, unspecified knee: Secondary | ICD-10-CM | POA: Diagnosis not present

## 2016-08-23 DIAGNOSIS — M25461 Effusion, right knee: Secondary | ICD-10-CM | POA: Diagnosis not present

## 2016-08-23 DIAGNOSIS — M25561 Pain in right knee: Secondary | ICD-10-CM | POA: Diagnosis not present

## 2016-08-28 DIAGNOSIS — D751 Secondary polycythemia: Secondary | ICD-10-CM | POA: Insufficient documentation

## 2016-08-28 NOTE — Progress Notes (Signed)
[place]  [phone] [phone]  ID: Kenneth Pruitt OB: [birth year]  MR#: [id number]  [id number]  Patient Care Team: Guadalupe Maple, MD as PCP - General (Family Medicine) Guadalupe Maple, MD as PCP - Family Medicine (Family Medicine) Brendolyn Patty, MD (Dermatology) Earnestine Leys, MD (Specialist) Dionisio David, MD as Consulting Physician (Cardiology) Albertine Patricia, DPM as Attending Physician (Podiatry)  CHIEF COMPLAINT: Secondary polycythemia.  INTERVAL HISTORY: Patient is a 65 year old male who has a persistently elevated hemoglobin secondary to testosterone injections. Previously, patient was able to donate blood at the [facility], but recently started new medication and is no longer allowed to donate. He is in clinic today for continuation of phlebotomy. He currently feels well and is asymptomatic. He does not complain of weakness or fatigue. He has no neurologic complaints. Patient denies any recent fevers or illnesses. He has a good appetite and denies weight loss. He has no chest pain or shortness of breath. He denies any nausea, vomiting, constipation, or diarrhea. He has no urinary complaints. Patient feels at his baseline and offers no specific complaints today.  REVIEW OF SYSTEMS:   Review of Systems  Constitutional: Negative.  Negative for fever, malaise/fatigue and weight loss.  Respiratory: Negative.  Negative for cough and shortness of breath.   Cardiovascular: Negative.  Negative for chest pain and leg swelling.  Gastrointestinal: Negative.  Negative for abdominal pain.  Genitourinary: Negative.   Musculoskeletal: Negative.   Neurological: Negative.   Psychiatric/Behavioral: Negative.  The patient is not nervous/anxious.     As per HPI. Otherwise, a complete review of systems is negative.  PAST MEDICAL HISTORY: Past Medical History:  Diagnosis Date  . Coronary artery disease   . Depression   . Hyperlipidemia      PAST SURGICAL HISTORY: Past Surgical History:  Procedure Laterality Date  . CARDIAC CATHETERIZATION N/A 04/26/2015   Procedure: Left Heart Cath;  Surgeon: Dionisio David, MD;  Location: [facility];  Service: Cardiovascular;  Laterality: N/A;  . CORONARY ANGIOPLASTY    . EYE SURGERY Bilateral    cataract    FAMILY HISTORY: Family History  Problem Relation Age of Onset  . Diabetes Mother   . Hypertension Mother   . Cancer Father     ADVANCED DIRECTIVES (Y/N):  N  HEALTH MAINTENANCE: Social History  Substance Use Topics  . Smoking status: Former Smoker    Years: 30.00    Types: Cigars  . Smokeless tobacco: Never Used  . Alcohol use No     Colonoscopy:  PAP:  Bone density:  Lipid panel:  No Known Allergies  Current Outpatient Prescriptions  Medication Sig Dispense Refill  . amLODipine (NORVASC) 10 MG tablet Take 1 tablet (10 mg total) by mouth daily. 30 tablet 12  . Cholecalciferol ([employer] VIT D 5000 HIGH-POTENCY) 5000 units capsule Take 5,000 Units by mouth daily.    . clopidogrel (PLAVIX) 75 MG tablet Take 75 mg by mouth daily.    Marland Kitchen dutasteride (AVODART) 0.5 MG capsule Take 1 capsule (0.5 mg total) by mouth daily. 30 capsule 12  . hydrochlorothiazide (MICROZIDE) 12.5 MG capsule Take 12.5 mg by mouth daily.    . meloxicam (MOBIC) 15 MG tablet Take 1 tablet (15 mg total) by mouth daily. 30 tablet 7  . niacin (NIASPAN) 1000 MG CR tablet Take 2 tablets (2,000 mg total) by mouth at bedtime. 60 tablet 12  . Omega-3 Fatty Acids (FISH OIL) 1000 MG CAPS Take 1,000  capsules by mouth.    . pantoprazole (PROTONIX) 40 MG tablet 40 mg daily.    . rosuvastatin (CRESTOR) 10 MG tablet Take 10 mg by mouth at bedtime.    . tamsulosin (FLOMAX) 0.4 MG CAPS capsule Take 1 capsule (0.4 mg total) by mouth daily. 30 capsule 12  . testosterone cypionate (DEPOTESTOSTERONE CYPIONATE) 200 MG/ML injection Inject 0.78ml intramuscular every 10 days 10 mL 2  . Turmeric 450 MG CAPS Take 1  capsule by mouth daily.    . valsartan (DIOVAN) 320 MG tablet TAKE ONE TABLET BY MOUTH EVERY DAY 30 tablet 12  . venlafaxine XR (EFFEXOR-XR) 150 MG 24 hr capsule Take 1 capsule (150 mg total) by mouth daily with breakfast. Reported on 11/17/2015 30 capsule 12  . vitamin B-12 (CYANOCOBALAMIN) 1000 MCG tablet Take 1,000 mcg by mouth daily.     No current facility-administered medications for this visit.     OBJECTIVE: Vitals:   08/29/16 1142  BP: (!) 176/89  Pulse: 86  Resp: 18  Temp: (!) 96.6 F (35.9 C)     Body mass index is 31.78 kg/m.    ECOG FS:0 - Asymptomatic  General: Well-developed, well-nourished, no acute distress. Eyes: Pink conjunctiva, anicteric sclera. HEENT: Normocephalic, moist mucous membranes, clear oropharnyx. Lungs: Clear to auscultation bilaterally. Heart: Regular rate and rhythm. No rubs, murmurs, or gallops. Abdomen: Soft, nontender, nondistended. No organomegaly noted, normoactive bowel sounds. Musculoskeletal: No edema, cyanosis, or clubbing. Neuro: Alert, answering all questions appropriately. Cranial nerves grossly intact. Skin: No rashes or petechiae noted. Psych: Normal affect. Lymphatics: No cervical, calvicular, axillary or inguinal LAD.   LAB RESULTS:  Lab Results  Component Value Date   NA 140 08/06/2016   K 3.9 08/06/2016   CL 100 08/06/2016   CO2 22 08/06/2016   GLUCOSE 130 (H) 08/06/2016   BUN 16 08/06/2016   CREATININE 1.04 08/06/2016   CALCIUM 9.1 08/06/2016   PROT 6.2 02/02/2016   ALBUMIN 4.3 02/02/2016   AST 28 08/06/2016   ALT 23 08/06/2016   ALKPHOS 49 02/02/2016   BILITOT 0.8 02/02/2016   GFRNONAA 75 08/06/2016   GFRAA 87 08/06/2016    Lab Results  Component Value Date   WBC 5.0 08/06/2016   NEUTROABS 3.0 08/06/2016   HCT 51.2 (H) 08/06/2016   MCV 91 08/06/2016   PLT 189 08/06/2016     STUDIES: No results found.  ASSESSMENT: Secondary polycythemia  PLAN:    1. 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 Patient will return later this week for 500 mL of phlebotomy. He will then return to clinic in 3 months for lab and phlebotomy and then in 6 months for further evaluation. If patient is a never able to return to the blood donation Center, further follow-up can be canceled. 2. Hypogonadism: Continue testosterone as per urology. 3. Hypertension: Patient's blood pressure is elevated today, continue monitoring and treatment per primary care.  Patient expressed understanding and was in agreement with this plan. He also understands that He can call clinic at any time with any questions, concerns, or complaints.    Lloyd Huger, MD   09/01/2016 10:54 AM

## 2016-08-29 ENCOUNTER — Inpatient Hospital Stay: Payer: Medicare Other | Attending: Oncology | Admitting: Oncology

## 2016-08-29 VITALS — BP 176/89 | HR 86 | Temp 96.6°F | Resp 18 | Wt 227.8 lb

## 2016-08-29 DIAGNOSIS — E785 Hyperlipidemia, unspecified: Secondary | ICD-10-CM

## 2016-08-29 DIAGNOSIS — I2510 Atherosclerotic heart disease of native coronary artery without angina pectoris: Secondary | ICD-10-CM | POA: Diagnosis not present

## 2016-08-29 DIAGNOSIS — Z809 Family history of malignant neoplasm, unspecified: Secondary | ICD-10-CM | POA: Diagnosis not present

## 2016-08-29 DIAGNOSIS — Z87891 Personal history of nicotine dependence: Secondary | ICD-10-CM | POA: Diagnosis not present

## 2016-08-29 DIAGNOSIS — Z79899 Other long term (current) drug therapy: Secondary | ICD-10-CM

## 2016-08-29 DIAGNOSIS — E291 Testicular hypofunction: Secondary | ICD-10-CM

## 2016-08-29 DIAGNOSIS — I10 Essential (primary) hypertension: Secondary | ICD-10-CM | POA: Insufficient documentation

## 2016-08-29 DIAGNOSIS — D751 Secondary polycythemia: Secondary | ICD-10-CM | POA: Insufficient documentation

## 2016-08-29 NOTE — Progress Notes (Signed)
New evaluation for elevated hemoglobin. Offers no complaints.

## 2016-08-30 ENCOUNTER — Other Ambulatory Visit: Payer: Self-pay | Admitting: Oncology

## 2016-08-31 ENCOUNTER — Inpatient Hospital Stay: Payer: Medicare Other

## 2016-08-31 DIAGNOSIS — I10 Essential (primary) hypertension: Secondary | ICD-10-CM | POA: Diagnosis not present

## 2016-08-31 DIAGNOSIS — E291 Testicular hypofunction: Secondary | ICD-10-CM | POA: Diagnosis not present

## 2016-08-31 DIAGNOSIS — Z79899 Other long term (current) drug therapy: Secondary | ICD-10-CM | POA: Diagnosis not present

## 2016-08-31 DIAGNOSIS — E785 Hyperlipidemia, unspecified: Secondary | ICD-10-CM | POA: Diagnosis not present

## 2016-08-31 DIAGNOSIS — I2510 Atherosclerotic heart disease of native coronary artery without angina pectoris: Secondary | ICD-10-CM | POA: Diagnosis not present

## 2016-08-31 DIAGNOSIS — D751 Secondary polycythemia: Secondary | ICD-10-CM | POA: Diagnosis not present

## 2016-09-08 ENCOUNTER — Other Ambulatory Visit: Payer: Self-pay | Admitting: Family Medicine

## 2016-10-19 ENCOUNTER — Other Ambulatory Visit: Payer: Self-pay | Admitting: Family Medicine

## 2016-11-10 ENCOUNTER — Other Ambulatory Visit: Payer: Self-pay | Admitting: Family Medicine

## 2016-11-27 ENCOUNTER — Inpatient Hospital Stay: Payer: Medicare Other

## 2016-11-27 ENCOUNTER — Inpatient Hospital Stay: Payer: Medicare Other | Attending: Oncology

## 2016-11-27 VITALS — BP 119/77 | HR 96 | Temp 96.3°F | Resp 18

## 2016-11-27 DIAGNOSIS — E291 Testicular hypofunction: Secondary | ICD-10-CM | POA: Diagnosis not present

## 2016-11-27 DIAGNOSIS — D751 Secondary polycythemia: Secondary | ICD-10-CM

## 2016-11-27 DIAGNOSIS — Z79899 Other long term (current) drug therapy: Secondary | ICD-10-CM | POA: Insufficient documentation

## 2016-11-27 LAB — CBC WITH DIFFERENTIAL/PLATELET
BASOS ABS: 0 10*3/uL (ref 0–0.1)
Basophils Relative: 1 %
Eosinophils Absolute: 0.2 10*3/uL (ref 0–0.7)
Eosinophils Relative: 3 %
HEMATOCRIT: 54.7 % — AB (ref 40.0–52.0)
Hemoglobin: 19.1 g/dL — ABNORMAL HIGH (ref 13.0–18.0)
LYMPHS PCT: 23 %
Lymphs Abs: 1.4 10*3/uL (ref 1.0–3.6)
MCH: 32.7 pg (ref 26.0–34.0)
MCHC: 35 g/dL (ref 32.0–36.0)
MCV: 93.4 fL (ref 80.0–100.0)
MONO ABS: 0.6 10*3/uL (ref 0.2–1.0)
Monocytes Relative: 10 %
NEUTROS ABS: 4.1 10*3/uL (ref 1.4–6.5)
Neutrophils Relative %: 63 %
Platelets: 187 10*3/uL (ref 150–440)
RBC: 5.86 MIL/uL (ref 4.40–5.90)
RDW: 12.9 % (ref 11.5–14.5)
WBC: 6.4 10*3/uL (ref 3.8–10.6)

## 2016-11-27 NOTE — Progress Notes (Signed)
Hgb: 19.1. Hct: 54.7. MD, Dr. Grayland Ormond, notified via telephone. Per MD order: proceed with 536ml phlebotomy today.

## 2016-11-30 DIAGNOSIS — R0602 Shortness of breath: Secondary | ICD-10-CM | POA: Diagnosis not present

## 2016-11-30 DIAGNOSIS — I2510 Atherosclerotic heart disease of native coronary artery without angina pectoris: Secondary | ICD-10-CM | POA: Diagnosis not present

## 2016-11-30 DIAGNOSIS — E785 Hyperlipidemia, unspecified: Secondary | ICD-10-CM | POA: Diagnosis not present

## 2016-11-30 DIAGNOSIS — I10 Essential (primary) hypertension: Secondary | ICD-10-CM | POA: Diagnosis not present

## 2016-12-11 ENCOUNTER — Other Ambulatory Visit: Payer: Self-pay | Admitting: Family Medicine

## 2016-12-11 NOTE — Telephone Encounter (Signed)
Routing to provider. Appt on 02/05/17.

## 2016-12-27 ENCOUNTER — Other Ambulatory Visit: Payer: Self-pay | Admitting: Family Medicine

## 2017-01-07 ENCOUNTER — Other Ambulatory Visit: Payer: Self-pay | Admitting: Family Medicine

## 2017-01-07 NOTE — Telephone Encounter (Signed)
  Last routine OV: 08/06/16 Next OV: 02/05/17

## 2017-01-08 ENCOUNTER — Other Ambulatory Visit: Payer: Self-pay | Admitting: Family Medicine

## 2017-01-09 ENCOUNTER — Inpatient Hospital Stay: Payer: Medicare Other | Attending: Oncology

## 2017-01-09 ENCOUNTER — Inpatient Hospital Stay: Payer: Medicare Other

## 2017-01-09 VITALS — BP 123/72 | HR 90 | Resp 20

## 2017-01-09 DIAGNOSIS — Z79899 Other long term (current) drug therapy: Secondary | ICD-10-CM | POA: Diagnosis not present

## 2017-01-09 DIAGNOSIS — D751 Secondary polycythemia: Secondary | ICD-10-CM | POA: Diagnosis not present

## 2017-01-09 DIAGNOSIS — E291 Testicular hypofunction: Secondary | ICD-10-CM | POA: Diagnosis not present

## 2017-01-09 LAB — CBC WITH DIFFERENTIAL/PLATELET
BASOS ABS: 0 10*3/uL (ref 0–0.1)
BASOS PCT: 1 %
EOS ABS: 0.2 10*3/uL (ref 0–0.7)
Eosinophils Relative: 3 %
HCT: 50.3 % (ref 40.0–52.0)
HEMOGLOBIN: 17.6 g/dL (ref 13.0–18.0)
Lymphocytes Relative: 28 %
Lymphs Abs: 1.5 10*3/uL (ref 1.0–3.6)
MCH: 32.4 pg (ref 26.0–34.0)
MCHC: 35 g/dL (ref 32.0–36.0)
MCV: 92.6 fL (ref 80.0–100.0)
Monocytes Absolute: 0.6 10*3/uL (ref 0.2–1.0)
Monocytes Relative: 10 %
NEUTROS PCT: 58 %
Neutro Abs: 3.3 10*3/uL (ref 1.4–6.5)
PLATELETS: 203 10*3/uL (ref 150–440)
RBC: 5.43 MIL/uL (ref 4.40–5.90)
RDW: 13 % (ref 11.5–14.5)
WBC: 5.6 10*3/uL (ref 3.8–10.6)

## 2017-01-09 NOTE — Progress Notes (Signed)
Dr. Grayland Ormond aware of patients labs (hct 50.3 and hgb 17.6) gave order to proceed with phlebotomy today.  500 ml.

## 2017-01-11 ENCOUNTER — Other Ambulatory Visit: Payer: Self-pay | Admitting: Family Medicine

## 2017-02-05 ENCOUNTER — Ambulatory Visit (INDEPENDENT_AMBULATORY_CARE_PROVIDER_SITE_OTHER): Payer: Medicare Other | Admitting: Family Medicine

## 2017-02-05 ENCOUNTER — Encounter: Payer: Self-pay | Admitting: Family Medicine

## 2017-02-05 VITALS — BP 176/99 | HR 71 | Ht 72.84 in | Wt 230.0 lb

## 2017-02-05 DIAGNOSIS — Z13220 Encounter for screening for lipoid disorders: Secondary | ICD-10-CM | POA: Diagnosis not present

## 2017-02-05 DIAGNOSIS — E782 Mixed hyperlipidemia: Secondary | ICD-10-CM | POA: Diagnosis not present

## 2017-02-05 DIAGNOSIS — I2510 Atherosclerotic heart disease of native coronary artery without angina pectoris: Secondary | ICD-10-CM | POA: Diagnosis not present

## 2017-02-05 DIAGNOSIS — Z1329 Encounter for screening for other suspected endocrine disorder: Secondary | ICD-10-CM | POA: Diagnosis not present

## 2017-02-05 DIAGNOSIS — Z125 Encounter for screening for malignant neoplasm of prostate: Secondary | ICD-10-CM | POA: Diagnosis not present

## 2017-02-05 DIAGNOSIS — F3342 Major depressive disorder, recurrent, in full remission: Secondary | ICD-10-CM

## 2017-02-05 DIAGNOSIS — Z0000 Encounter for general adult medical examination without abnormal findings: Secondary | ICD-10-CM | POA: Diagnosis not present

## 2017-02-05 DIAGNOSIS — R972 Elevated prostate specific antigen [PSA]: Secondary | ICD-10-CM | POA: Diagnosis not present

## 2017-02-05 DIAGNOSIS — I10 Essential (primary) hypertension: Secondary | ICD-10-CM

## 2017-02-05 DIAGNOSIS — Z7189 Other specified counseling: Secondary | ICD-10-CM

## 2017-02-05 DIAGNOSIS — D751 Secondary polycythemia: Secondary | ICD-10-CM

## 2017-02-05 DIAGNOSIS — E291 Testicular hypofunction: Secondary | ICD-10-CM | POA: Diagnosis not present

## 2017-02-05 LAB — URINALYSIS, ROUTINE W REFLEX MICROSCOPIC
BILIRUBIN UA: NEGATIVE
Glucose, UA: NEGATIVE
Ketones, UA: NEGATIVE
Leukocytes, UA: NEGATIVE
Nitrite, UA: NEGATIVE
PH UA: 7.5 (ref 5.0–7.5)
RBC, UA: NEGATIVE
Specific Gravity, UA: 1.015 (ref 1.005–1.030)
UUROB: 1 mg/dL (ref 0.2–1.0)

## 2017-02-05 LAB — MICROSCOPIC EXAMINATION
BACTERIA UA: NONE SEEN
RBC MICROSCOPIC, UA: NONE SEEN /HPF (ref 0–?)

## 2017-02-05 MED ORDER — VENLAFAXINE HCL ER 150 MG PO CP24: 150.0000 mg | ORAL_CAPSULE | Freq: Every day | ORAL | 12 refills | Status: DC

## 2017-02-05 MED ORDER — VALSARTAN 320 MG PO TABS: 320.0000 mg | ORAL_TABLET | Freq: Every day | ORAL | 12 refills | Status: DC

## 2017-02-05 MED ORDER — TAMSULOSIN HCL 0.4 MG PO CAPS: 0.4000 mg | ORAL_CAPSULE | Freq: Every day | ORAL | 12 refills | Status: DC

## 2017-02-05 MED ORDER — MELOXICAM 15 MG PO TABS: 15.0000 mg | ORAL_TABLET | Freq: Every day | ORAL | 6 refills | Status: DC

## 2017-02-05 MED ORDER — CLONIDINE HCL 0.1 MG PO TABS: 0.1000 mg | ORAL_TABLET | Freq: Two times a day (BID) | ORAL | 12 refills | Status: DC

## 2017-02-05 MED ORDER — NIACIN ER (ANTIHYPERLIPIDEMIC) 1000 MG PO TBCR: 2000.0000 mg | EXTENDED_RELEASE_TABLET | Freq: Every day | ORAL | 12 refills | Status: DC

## 2017-02-05 NOTE — Progress Notes (Signed)
BP (!) 176/99   Pulse 71   Ht 6' 0.83" (1.85 m)   Wt 230 lb (104.3 kg)   SpO2 97%   BMI 30.48 kg/m    Subjective:    Patient ID: Kenneth Pruitt, male    DOB: [birth year], 66 y.o.   MRN: [id number]  HPI: Kenneth Pruitt is a 66 y.o. male  Chief Complaint  Patient presents with  . Annual Exam  . Knee Pain    bilaterally  Patient all in all doing well modest stress with his mother who is in declining health and needs more help. Took testosterone shot yesterday and is otherwise doing well. Getting good reports from cardiology. Knees right knee getting worse will ultimately need knee replacement surgery left knee though is starting to develop a Baker's cyst. Takes meloxicam every day. AWV metrics met  Relevant past medical, surgical, family and social history reviewed and updated as indicated. Interim medical history since our last visit reviewed. Allergies and medications reviewed and updated.  Review of Systems  Constitutional: Negative.   HENT: Negative.   Eyes: Negative.   Respiratory: Negative.   Cardiovascular: Negative.   Gastrointestinal: Negative.   Endocrine: Negative.   Genitourinary: Negative.   Musculoskeletal: Negative.   Skin: Negative.   Allergic/Immunologic: Negative.   Neurological: Negative.   Hematological: Negative.   Psychiatric/Behavioral: Negative.     Per HPI unless specifically indicated above     Objective:    BP (!) 176/99   Pulse 71   Ht 6' 0.83" (1.85 m)   Wt 230 lb (104.3 kg)   SpO2 97%   BMI 30.48 kg/m   Wt Readings from Last 3 Encounters:  02/05/17 230 lb (104.3 kg)  08/29/16 227 lb 13.5 oz (103.4 kg)  08/06/16 224 lb (101.6 kg)    Physical Exam  Constitutional: He is oriented to person, place, and time. He appears well-developed and well-nourished.  HENT:  Head: Normocephalic and atraumatic.  Right Ear: External ear normal.  Left Ear: External ear normal.  Eyes: Conjunctivae and EOM are normal. Pupils are equal,  round, and reactive to light.  Neck: Normal range of motion. Neck supple.  Cardiovascular: Normal rate, regular rhythm, normal heart sounds and intact distal pulses.   Pulmonary/Chest: Effort normal and breath sounds normal.  Abdominal: Soft. Bowel sounds are normal. There is no splenomegaly or hepatomegaly.  Genitourinary: Rectum normal, prostate normal and penis normal.  Musculoskeletal: Normal range of motion.  Neurological: He is alert and oriented to person, place, and time. He has normal reflexes.  Skin: No rash noted. No erythema.  Psychiatric: He has a normal mood and affect. His behavior is normal. Judgment and thought content normal.    Results for orders placed or performed in visit on 01/09/17  CBC with Differential/Platelet  Result Value Ref Range   WBC 5.6 3.8 - 10.6 K/uL   RBC 5.43 4.40 - 5.90 MIL/uL   Hemoglobin 17.6 13.0 - 18.0 g/dL   HCT 50.3 40.0 - 52.0 %   MCV 92.6 80.0 - 100.0 fL   MCH 32.4 26.0 - 34.0 pg   MCHC 35.0 32.0 - 36.0 g/dL   RDW 13.0 11.5 - 14.5 %   Platelets 203 150 - 440 K/uL   Neutrophils Relative % 58 %   Neutro Abs 3.3 1.4 - 6.5 K/uL   Lymphocytes Relative 28 %   Lymphs Abs 1.5 1.0 - 3.6 K/uL   Monocytes Relative 10 %   Monocytes Absolute 0.6  0.2 - 1.0 K/uL   Eosinophils Relative 3 %   Eosinophils Absolute 0.2 0 - 0.7 K/uL   Basophils Relative 1 %   Basophils Absolute 0.0 0 - 0.1 K/uL      Assessment & Plan:   Problem List Items Addressed This Visit      Cardiovascular and Mediastinum   Coronary artery disease    The current medical regimen is effective;  continue present plan and medications.       Relevant Medications   cloNIDine (CATAPRES) 0.1 MG tablet   niacin (NIASPAN) 1000 MG CR tablet   valsartan (DIOVAN) 320 MG tablet   Other Relevant Orders   TSH   Essential hypertension    Discussed elevated blood pressure partial reason weight gain and high stress environment. Patient unable to take hydrochlorothiazide during the  summertime because of long distance bike riding and dehydration will stop hydrochlorothiazide discuss start clonidine recheck 1 month with BMP      Relevant Medications   cloNIDine (CATAPRES) 0.1 MG tablet   niacin (NIASPAN) 1000 MG CR tablet   valsartan (DIOVAN) 320 MG tablet   Other Relevant Orders   CBC with Differential/Platelet   Comprehensive metabolic panel   Urinalysis, Routine w reflex microscopic   TSH     Endocrine   Hypogonadism in male    The current medical regimen is effective;  continue present plan and medications. Checking testosterone today patient had shot yesterday so will be peak levels.      Relevant Orders   PSA   TSH   Testosterone     Other   Depression    The current medical regimen is effective;  continue present plan and medications.       Relevant Medications   venlafaxine XR (EFFEXOR-XR) 150 MG 24 hr capsule   Hyperlipidemia    The current medical regimen is effective;  continue present plan and medications.       Relevant Medications   cloNIDine (CATAPRES) 0.1 MG tablet   niacin (NIASPAN) 1000 MG CR tablet   valsartan (DIOVAN) 320 MG tablet   Other Relevant Orders   CBC with Differential/Platelet   Comprehensive metabolic panel   Lipid panel   Urinalysis, Routine w reflex microscopic   Elevated PSA   Relevant Medications   tamsulosin (FLOMAX) 0.4 MG CAPS capsule   Other Relevant Orders   PSA   Advanced care planning/counseling discussion    A voluntary discussion about advance care planning including the explanation and discussion of advance directives was extensively discussed  with the patient.  Explanation about the health care proxy and Living will was reviewed and packet with forms with explanation of how to fill them out was given.         Other Visit Diagnoses    Annual physical exam    -  Primary   Relevant Orders   CBC with Differential/Platelet   Comprehensive metabolic panel   Lipid panel   PSA   Urinalysis,  Routine w reflex microscopic   TSH   Screening cholesterol level       Relevant Orders   Lipid panel   Prostate cancer screening       Relevant Orders   PSA   Thyroid disorder screen       Relevant Orders   TSH       Follow up plan: Return in about 4 weeks (around 03/05/2017) for BMP BP check.

## 2017-02-05 NOTE — Assessment & Plan Note (Signed)
Discussed elevated blood pressure partial reason weight gain and high stress environment. Patient unable to take hydrochlorothiazide during the summertime because of long distance bike riding and dehydration will stop hydrochlorothiazide discuss start clonidine recheck 1 month with BMP

## 2017-02-05 NOTE — Assessment & Plan Note (Signed)
A voluntary discussion about advance care planning including the explanation and discussion of advance directives was extensively discussed  with the patient.  Explanation about the health care proxy and Living will was reviewed and packet with forms with explanation of how to fill them out was given.    

## 2017-02-05 NOTE — Assessment & Plan Note (Signed)
The current medical regimen is effective;  continue present plan and medications.  

## 2017-02-05 NOTE — Assessment & Plan Note (Signed)
The current medical regimen is effective;  continue present plan and medications. Checking testosterone today patient had shot yesterday so will be peak levels.

## 2017-02-06 LAB — LIPID PANEL
CHOLESTEROL TOTAL: 93 mg/dL — AB (ref 100–199)
Chol/HDL Ratio: 2.9 ratio (ref 0.0–5.0)
HDL: 32 mg/dL — AB (ref >39)
LDL Calculated: 33 mg/dL (ref 0–99)
TRIGLYCERIDES: 139 mg/dL (ref 0–149)
VLDL Cholesterol Cal: 28 mg/dL (ref 5–40)

## 2017-02-06 LAB — CBC WITH DIFFERENTIAL/PLATELET
BASOS: 1 %
Basophils Absolute: 0 10*3/uL (ref 0.0–0.2)
EOS (ABSOLUTE): 0.1 10*3/uL (ref 0.0–0.4)
Eos: 3 %
Hematocrit: 48.3 % (ref 37.5–51.0)
Hemoglobin: 17.1 g/dL (ref 13.0–17.7)
IMMATURE GRANS (ABS): 0 10*3/uL (ref 0.0–0.1)
Immature Granulocytes: 0 %
LYMPHS ABS: 1.4 10*3/uL (ref 0.7–3.1)
LYMPHS: 31 %
MCH: 32.6 pg (ref 26.6–33.0)
MCHC: 35.4 g/dL (ref 31.5–35.7)
MCV: 92 fL (ref 79–97)
Monocytes Absolute: 0.4 10*3/uL (ref 0.1–0.9)
Monocytes: 9 %
Neutrophils Absolute: 2.6 10*3/uL (ref 1.4–7.0)
Neutrophils: 56 %
PLATELETS: 234 10*3/uL (ref 150–379)
RBC: 5.24 x10E6/uL (ref 4.14–5.80)
RDW: 12.9 % (ref 12.3–15.4)
WBC: 4.6 10*3/uL (ref 3.4–10.8)

## 2017-02-06 LAB — COMPREHENSIVE METABOLIC PANEL
A/G RATIO: 2.1 (ref 1.2–2.2)
ALT: 19 IU/L (ref 0–44)
AST: 20 IU/L (ref 0–40)
Albumin: 4.5 g/dL (ref 3.6–4.8)
Alkaline Phosphatase: 51 IU/L (ref 39–117)
BILIRUBIN TOTAL: 0.6 mg/dL (ref 0.0–1.2)
BUN/Creatinine Ratio: 20 (ref 10–24)
BUN: 21 mg/dL (ref 8–27)
CO2: 26 mmol/L (ref 18–29)
Calcium: 9.3 mg/dL (ref 8.6–10.2)
Chloride: 101 mmol/L (ref 96–106)
Creatinine, Ser: 1.07 mg/dL (ref 0.76–1.27)
GFR calc Af Amer: 84 mL/min/{1.73_m2} (ref >59)
GFR, EST NON AFRICAN AMERICAN: 72 mL/min/{1.73_m2} (ref >59)
Globulin, Total: 2.1 g/dL (ref 1.5–4.5)
Glucose: 95 mg/dL (ref 65–99)
Potassium: 4.2 mmol/L (ref 3.5–5.2)
SODIUM: 140 mmol/L (ref 134–144)
TOTAL PROTEIN: 6.6 g/dL (ref 6.0–8.5)

## 2017-02-06 LAB — PSA: Prostate Specific Ag, Serum: 2.1 ng/mL (ref 0.0–4.0)

## 2017-02-06 LAB — TSH: TSH: 1.03 u[IU]/mL (ref 0.450–4.500)

## 2017-02-06 NOTE — Addendum Note (Signed)
Addended by: Crissie Figures R on: 02/06/2017 10:10 AM   Modules accepted: Orders

## 2017-02-07 ENCOUNTER — Encounter: Payer: Self-pay | Admitting: Family Medicine

## 2017-02-08 LAB — TESTOSTERONE: Testosterone: 1176 ng/dL — ABNORMAL HIGH (ref 264–916)

## 2017-02-08 LAB — SPECIMEN STATUS REPORT

## 2017-02-14 ENCOUNTER — Other Ambulatory Visit: Payer: Self-pay | Admitting: Family Medicine

## 2017-02-26 NOTE — Progress Notes (Signed)
[place]  [phone]) [phone] [phone]  ID: Kenneth Pruitt OB: 04/20/1951  MR#: [id number]  JME#:[id number]  Patient Care Team: Guadalupe Maple, MD as PCP - General (Family Medicine) Guadalupe Maple, MD as PCP - Family Medicine (Family Medicine) Brendolyn Patty, MD (Dermatology) Earnestine Leys, MD (Specialist) Dionisio David, MD as Consulting Physician (Cardiology) Troxler, Adele Schilder as Attending Physician (Podiatry)  CHIEF COMPLAINT: Secondary polycythemia.  INTERVAL HISTORY: Patient returns to clinic today for repeat laboratory work, further evaluation, and consideration of additional phlebotomy. He continues to feel well and remains asymptomatic. Previously, patient was able to donate blood at the [facility], but recently started new medication and is no longer allowed to donate.  He does not complain of weakness or fatigue. He has no neurologic complaints. Patient denies any recent fevers or illnesses. He has a good appetite and denies weight loss. He has no chest pain or shortness of breath. He denies any nausea, vomiting, constipation, or diarrhea. He has no urinary complaints. Patient feels at his baseline and offers no specific complaints today.  REVIEW OF SYSTEMS:   Review of Systems  Constitutional: Negative.  Negative for fever, malaise/fatigue and weight loss.  Respiratory: Negative.  Negative for cough and shortness of breath.   Cardiovascular: Negative.  Negative for chest pain and leg swelling.  Gastrointestinal: Negative.  Negative for abdominal pain.  Genitourinary: Negative.   Musculoskeletal: Negative.   Neurological: Negative.   Psychiatric/Behavioral: Negative.  The patient is not nervous/anxious.     As per HPI. Otherwise, a complete review of systems is negative.  PAST MEDICAL HISTORY: Past Medical History:  Diagnosis Date  . Coronary artery disease   . Depression   . Hyperlipidemia     PAST SURGICAL HISTORY: Past  Surgical History:  Procedure Laterality Date  . CARDIAC CATHETERIZATION N/A 04/26/2015   Procedure: Left Heart Cath;  Surgeon: Dionisio David, MD;  Location: [facility];  Service: Cardiovascular;  Laterality: N/A;  . CORONARY ANGIOPLASTY    . EYE SURGERY Bilateral    cataract    FAMILY HISTORY: Family History  Problem Relation Age of Onset  . Diabetes Mother   . Hypertension Mother   . Cancer Father     ADVANCED DIRECTIVES (Y/N):  N  HEALTH MAINTENANCE: Social History  Substance Use Topics  . Smoking status: Former Smoker    Years: 30.00    Types: Cigars  . Smokeless tobacco: Never Used  . Alcohol use No     Colonoscopy:  PAP:  Bone density:  Lipid panel:  No Known Allergies  Current Outpatient Prescriptions  Medication Sig Dispense Refill  . amLODipine (NORVASC) 10 MG tablet Take 1 tablet (10 mg total) by mouth daily. 30 tablet 12  . Cholecalciferol ([employer] VIT D 5000 HIGH-POTENCY) 5000 units capsule Take 5,000 Units by mouth daily.    . cloNIDine (CATAPRES) 0.1 MG tablet Take 1 tablet (0.1 mg total) by mouth 2 (two) times daily. 60 tablet 12  . clopidogrel (PLAVIX) 75 MG tablet Take 75 mg by mouth daily.    Marland Kitchen dutasteride (AVODART) 0.5 MG capsule TAKE 1 CAPSULE BY MOUTH EVERY DAY 30 capsule 11  . fluticasone (FLONASE) 50 MCG/ACT nasal spray USE 2 SPRAYS IN EACH NOSTRIL DAILY 16 g 6  . meloxicam (MOBIC) 15 MG tablet Take 1 tablet (15 mg total) by mouth daily. 30 tablet 6  . mupirocin ointment (BACTROBAN) 2 % APPLY AS DIRECTED 22 g 1  . niacin (  NIASPAN) 1000 MG CR tablet Take 2 tablets (2,000 mg total) by mouth at bedtime. 60 tablet 12  . Omega-3 Fatty Acids (FISH OIL) 1000 MG CAPS Take 1,000 capsules by mouth.    . pantoprazole (PROTONIX) 40 MG tablet 40 mg daily.    . rosuvastatin (CRESTOR) 10 MG tablet Take 10 mg by mouth at bedtime.    . tamsulosin (FLOMAX) 0.4 MG CAPS capsule Take 1 capsule (0.4 mg total) by mouth daily. 30 capsule 12  . testosterone  cypionate (DEPOTESTOSTERONE CYPIONATE) 200 MG/ML injection INJECT 0.75 ML INTRMUSCULAR EVERY 10 DAYS 10 mL 1  . Turmeric 450 MG CAPS Take 1 capsule by mouth daily.    . valsartan (DIOVAN) 320 MG tablet Take 1 tablet (320 mg total) by mouth daily. 30 tablet 12  . venlafaxine XR (EFFEXOR-XR) 150 MG 24 hr capsule Take 1 capsule (150 mg total) by mouth daily with breakfast. Reported on 11/17/2015 30 capsule 12  . vitamin B-12 (CYANOCOBALAMIN) 1000 MCG tablet Take 1,000 mcg by mouth daily.     No current facility-administered medications for this visit.     OBJECTIVE: Vitals:   02/27/17 1444  BP: (!) 188/81  Pulse: 97  Resp: 20  Temp: 97.5 F (36.4 C)     Body mass index is 30.1 kg/m.    ECOG FS:0 - Asymptomatic  General: Well-developed, well-nourished, no acute distress. Eyes: Pink conjunctiva, anicteric sclera. Lungs: Clear to auscultation bilaterally. Heart: Regular rate and rhythm. No rubs, murmurs, or gallops. Abdomen: Soft, nontender, nondistended. No organomegaly noted, normoactive bowel sounds. Musculoskeletal: No edema, cyanosis, or clubbing. Neuro: Alert, answering all questions appropriately. Cranial nerves grossly intact. Skin: No rashes or petechiae noted. Psych: Normal affect.   LAB RESULTS:  Lab Results  Component Value Date   NA 140 02/05/2017   K 4.2 02/05/2017   CL 101 02/05/2017   CO2 26 02/05/2017   GLUCOSE 95 02/05/2017   BUN 21 02/05/2017   CREATININE 1.07 02/05/2017   CALCIUM 9.3 02/05/2017   PROT 6.6 02/05/2017   ALBUMIN 4.5 02/05/2017   AST 20 02/05/2017   ALT 19 02/05/2017   ALKPHOS 51 02/05/2017   BILITOT 0.6 02/05/2017   GFRNONAA 72 02/05/2017   GFRAA 84 02/05/2017    Lab Results  Component Value Date   WBC 10.5 02/27/2017   NEUTROABS 8.2 (H) 02/27/2017   HGB 17.6 02/27/2017   HCT 49.9 02/27/2017   MCV 91.6 02/27/2017   PLT 243 02/27/2017     STUDIES: No results found.  ASSESSMENT: Secondary polycythemia  PLAN:    1.  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 Patient's hemoglobin is within normal limits at 17.6, therefore he does not require additional phlebotomy today. We will consider phlebotomy only if his hemoglobin trends above 18.0. Return to clinic in 3 months for lab and phlebotomy and then in 6 months for further evaluation. If patient is a never able to return to the blood donation Center, further follow-up can be canceled. 2. Low testosterone: Continue testosterone as per urology. 3. Hypertension: Patient's blood pressure is elevated today, continue monitoring and treatment per primary care.  Patient expressed understanding and was in agreement with this plan. He also understands that He can call clinic at any time with any questions, concerns, or complaints.    Lloyd Huger, MD   03/01/2017 3:09 PM

## 2017-02-27 ENCOUNTER — Inpatient Hospital Stay (HOSPITAL_BASED_OUTPATIENT_CLINIC_OR_DEPARTMENT_OTHER): Payer: Medicare Other | Admitting: Oncology

## 2017-02-27 ENCOUNTER — Inpatient Hospital Stay: Payer: Medicare Other

## 2017-02-27 ENCOUNTER — Inpatient Hospital Stay: Payer: Medicare Other | Attending: Oncology

## 2017-02-27 VITALS — BP 188/81 | HR 97 | Temp 97.5°F | Resp 20 | Wt 227.1 lb

## 2017-02-27 DIAGNOSIS — E291 Testicular hypofunction: Secondary | ICD-10-CM

## 2017-02-27 DIAGNOSIS — D751 Secondary polycythemia: Secondary | ICD-10-CM | POA: Diagnosis not present

## 2017-02-27 DIAGNOSIS — E785 Hyperlipidemia, unspecified: Secondary | ICD-10-CM

## 2017-02-27 DIAGNOSIS — I10 Essential (primary) hypertension: Secondary | ICD-10-CM | POA: Diagnosis not present

## 2017-02-27 DIAGNOSIS — Z79899 Other long term (current) drug therapy: Secondary | ICD-10-CM | POA: Insufficient documentation

## 2017-02-27 DIAGNOSIS — I2510 Atherosclerotic heart disease of native coronary artery without angina pectoris: Secondary | ICD-10-CM | POA: Diagnosis not present

## 2017-02-27 DIAGNOSIS — Z87891 Personal history of nicotine dependence: Secondary | ICD-10-CM | POA: Insufficient documentation

## 2017-02-27 LAB — CBC WITH DIFFERENTIAL/PLATELET
BASOS ABS: 0.1 10*3/uL (ref 0–0.1)
Basophils Relative: 1 %
EOS ABS: 0.1 10*3/uL (ref 0–0.7)
Eosinophils Relative: 1 %
HCT: 49.9 % (ref 40.0–52.0)
Hemoglobin: 17.6 g/dL (ref 13.0–18.0)
LYMPHS PCT: 14 %
Lymphs Abs: 1.5 10*3/uL (ref 1.0–3.6)
MCH: 32.3 pg (ref 26.0–34.0)
MCHC: 35.3 g/dL (ref 32.0–36.0)
MCV: 91.6 fL (ref 80.0–100.0)
Monocytes Absolute: 0.7 10*3/uL (ref 0.2–1.0)
Monocytes Relative: 7 %
Neutro Abs: 8.2 10*3/uL — ABNORMAL HIGH (ref 1.4–6.5)
Neutrophils Relative %: 77 %
PLATELETS: 243 10*3/uL (ref 150–440)
RBC: 5.45 MIL/uL (ref 4.40–5.90)
RDW: 13.2 % (ref 11.5–14.5)
WBC: 10.5 10*3/uL (ref 3.8–10.6)

## 2017-02-27 NOTE — Progress Notes (Signed)
Patient denies any concerns today.  

## 2017-03-12 ENCOUNTER — Encounter: Payer: Self-pay | Admitting: Family Medicine

## 2017-03-12 ENCOUNTER — Ambulatory Visit (INDEPENDENT_AMBULATORY_CARE_PROVIDER_SITE_OTHER): Payer: Medicare Other | Admitting: Family Medicine

## 2017-03-12 VITALS — BP 138/82 | HR 66 | Ht 73.0 in | Wt 227.0 lb

## 2017-03-12 DIAGNOSIS — I2510 Atherosclerotic heart disease of native coronary artery without angina pectoris: Secondary | ICD-10-CM

## 2017-03-12 DIAGNOSIS — E291 Testicular hypofunction: Secondary | ICD-10-CM | POA: Diagnosis not present

## 2017-03-12 DIAGNOSIS — F3342 Major depressive disorder, recurrent, in full remission: Secondary | ICD-10-CM | POA: Diagnosis not present

## 2017-03-12 DIAGNOSIS — I10 Essential (primary) hypertension: Secondary | ICD-10-CM

## 2017-03-12 NOTE — Assessment & Plan Note (Signed)
Good control with lifestyle changes not on clonidine or hydrochlorothiazide

## 2017-03-12 NOTE — Assessment & Plan Note (Signed)
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

## 2017-03-12 NOTE — Assessment & Plan Note (Signed)
The current medical regimen is effective;  continue present plan and medications.  

## 2017-03-12 NOTE — Progress Notes (Signed)
BP 138/82 (BP Location: Left Arm)   Pulse 66   Ht 6\' 1"  (1.854 m)   Wt 227 lb (103 kg)   SpO2 99%   BMI 29.95 kg/m    Subjective:    Patient ID: Kenneth Pruitt, male    DOB: Aug 12, 1951, 66 y.o.   MRN: [id number]  HPI: Kenneth Pruitt is a 66 y.o. male  Chief Complaint  Patient presents with  . Follow-up   Follow-up hypertension is not taking clonidine because of side effects not taking hydrochlorothiazide because summertime and dehydration issues with long distance bike riding. Has increased activity level exercise and has really crossed the threshold of able to lose weight and conditioning feeling much better with better blood pressure control. Checking blood pressure at home and having good readings also. Still having knee issues but stable. Testosterone doing okay. Relevant past medical, surgical, family and social history reviewed and updated as indicated. Interim medical history since our last visit reviewed. Allergies and medications reviewed and updated.  Review of Systems  Constitutional: Negative.   Respiratory: Negative.   Cardiovascular: Negative.     Per HPI unless specifically indicated above     Objective:    BP 138/82 (BP Location: Left Arm)   Pulse 66   Ht 6\' 1"  (1.854 m)   Wt 227 lb (103 kg)   SpO2 99%   BMI 29.95 kg/m   Wt Readings from Last 3 Encounters:  03/12/17 227 lb (103 kg)  02/27/17 227 lb 1.6 oz (103 kg)  02/05/17 230 lb (104.3 kg)    Physical Exam  Constitutional: He is oriented to person, place, and time. He appears well-developed and well-nourished.  HENT:  Head: Normocephalic and atraumatic.  Eyes: Conjunctivae and EOM are normal.  Neck: Normal range of motion.  Cardiovascular: Normal rate, regular rhythm and normal heart sounds.   Pulmonary/Chest: Effort normal and breath sounds normal.  Musculoskeletal: Normal range of motion.  Neurological: He is alert and oriented to person, place, and time.  Skin: No erythema.    Psychiatric: He has a normal mood and affect. His behavior is normal. Judgment and thought content normal.    Results for orders placed or performed in visit on 02/27/17  CBC with Differential/Platelet  Result Value Ref Range   WBC 10.5 3.8 - 10.6 K/uL   RBC 5.45 4.40 - 5.90 MIL/uL   Hemoglobin 17.6 13.0 - 18.0 g/dL   HCT 49.9 40.0 - 52.0 %   MCV 91.6 80.0 - 100.0 fL   MCH 32.3 26.0 - 34.0 pg   MCHC 35.3 32.0 - 36.0 g/dL   RDW 13.2 11.5 - 14.5 %   Platelets 243 150 - 440 K/uL   Neutrophils Relative % 77 %   Neutro Abs 8.2 (H) 1.4 - 6.5 K/uL   Lymphocytes Relative 14 %   Lymphs Abs 1.5 1.0 - 3.6 K/uL   Monocytes Relative 7 %   Monocytes Absolute 0.7 0.2 - 1.0 K/uL   Eosinophils Relative 1 %   Eosinophils Absolute 0.1 0 - 0.7 K/uL   Basophils Relative 1 %   Basophils Absolute 0.1 0 - 0.1 K/uL      Assessment & Plan:   Problem List Items Addressed This Visit      Cardiovascular and Mediastinum   Essential hypertension - Primary    Good control with lifestyle changes not on clonidine or hydrochlorothiazide      Relevant Orders   Basic metabolic panel  Endocrine   Hypogonadism in male    The current medical regimen is effective;  continue present plan and medications.         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          Follow up plan: Return in about 6 months (around 09/11/2017) for BMP,  Lipids, ALT, AST,PSA, CBC.

## 2017-03-13 LAB — BASIC METABOLIC PANEL
BUN / CREAT RATIO: 17 (ref 10–24)
BUN: 20 mg/dL (ref 8–27)
CALCIUM: 8.9 mg/dL (ref 8.6–10.2)
CHLORIDE: 101 mmol/L (ref 96–106)
CO2: 19 mmol/L — ABNORMAL LOW (ref 20–29)
Creatinine, Ser: 1.17 mg/dL (ref 0.76–1.27)
GFR calc Af Amer: 75 mL/min/{1.73_m2} (ref >59)
GFR calc non Af Amer: 65 mL/min/{1.73_m2} (ref >59)
GLUCOSE: 150 mg/dL — AB (ref 65–99)
Potassium: 3.8 mmol/L (ref 3.5–5.2)
Sodium: 139 mmol/L (ref 134–144)

## 2017-03-14 ENCOUNTER — Telehealth: Payer: Self-pay | Admitting: Family Medicine

## 2017-03-14 NOTE — Telephone Encounter (Signed)
Phone call Discussed with patient elevated glucose nonfasting. Discuss prediabetes treatment with weight loss. Will check glucose and consider A1c later.

## 2017-03-18 DIAGNOSIS — I2510 Atherosclerotic heart disease of native coronary artery without angina pectoris: Secondary | ICD-10-CM | POA: Diagnosis not present

## 2017-03-18 DIAGNOSIS — E785 Hyperlipidemia, unspecified: Secondary | ICD-10-CM | POA: Diagnosis not present

## 2017-03-18 DIAGNOSIS — I10 Essential (primary) hypertension: Secondary | ICD-10-CM | POA: Diagnosis not present

## 2017-03-18 DIAGNOSIS — R079 Chest pain, unspecified: Secondary | ICD-10-CM | POA: Diagnosis not present

## 2017-03-19 DIAGNOSIS — R079 Chest pain, unspecified: Secondary | ICD-10-CM | POA: Diagnosis not present

## 2017-04-02 DIAGNOSIS — Z9861 Coronary angioplasty status: Secondary | ICD-10-CM | POA: Diagnosis not present

## 2017-04-02 DIAGNOSIS — R079 Chest pain, unspecified: Secondary | ICD-10-CM | POA: Diagnosis not present

## 2017-04-02 DIAGNOSIS — E785 Hyperlipidemia, unspecified: Secondary | ICD-10-CM | POA: Diagnosis not present

## 2017-04-02 DIAGNOSIS — I340 Nonrheumatic mitral (valve) insufficiency: Secondary | ICD-10-CM | POA: Diagnosis not present

## 2017-04-02 DIAGNOSIS — I10 Essential (primary) hypertension: Secondary | ICD-10-CM | POA: Diagnosis not present

## 2017-04-02 DIAGNOSIS — I2510 Atherosclerotic heart disease of native coronary artery without angina pectoris: Secondary | ICD-10-CM | POA: Diagnosis not present

## 2017-04-17 ENCOUNTER — Other Ambulatory Visit: Payer: Self-pay | Admitting: Family Medicine

## 2017-04-30 ENCOUNTER — Other Ambulatory Visit: Payer: Self-pay | Admitting: Family Medicine

## 2017-04-30 MED ORDER — LOSARTAN POTASSIUM 100 MG PO TABS: 100.0000 mg | ORAL_TABLET | Freq: Every day | ORAL | 2 refills | Status: DC

## 2017-04-30 NOTE — Progress Notes (Signed)
Change valsartan to losartan

## 2017-05-30 ENCOUNTER — Inpatient Hospital Stay: Payer: Medicare Other | Attending: Oncology

## 2017-05-30 ENCOUNTER — Inpatient Hospital Stay: Payer: Medicare Other

## 2017-05-30 DIAGNOSIS — D751 Secondary polycythemia: Secondary | ICD-10-CM

## 2017-05-30 DIAGNOSIS — E291 Testicular hypofunction: Secondary | ICD-10-CM | POA: Insufficient documentation

## 2017-05-30 DIAGNOSIS — Z79899 Other long term (current) drug therapy: Secondary | ICD-10-CM | POA: Diagnosis not present

## 2017-05-30 LAB — CBC WITH DIFFERENTIAL/PLATELET
BASOS ABS: 0 10*3/uL (ref 0–0.1)
BASOS PCT: 1 %
Eosinophils Absolute: 0.1 10*3/uL (ref 0–0.7)
Eosinophils Relative: 3 %
HEMATOCRIT: 51.1 % (ref 40.0–52.0)
HEMOGLOBIN: 17.4 g/dL (ref 13.0–18.0)
LYMPHS PCT: 21 %
Lymphs Abs: 1.2 10*3/uL (ref 1.0–3.6)
MCH: 31 pg (ref 26.0–34.0)
MCHC: 34.1 g/dL (ref 32.0–36.0)
MCV: 90.8 fL (ref 80.0–100.0)
MONO ABS: 0.5 10*3/uL (ref 0.2–1.0)
MONOS PCT: 10 %
NEUTROS ABS: 3.7 10*3/uL (ref 1.4–6.5)
Neutrophils Relative %: 65 %
Platelets: 204 10*3/uL (ref 150–440)
RBC: 5.62 MIL/uL (ref 4.40–5.90)
RDW: 14.5 % (ref 11.5–14.5)
WBC: 5.6 10*3/uL (ref 3.8–10.6)

## 2017-06-12 ENCOUNTER — Other Ambulatory Visit: Payer: Self-pay | Admitting: Family Medicine

## 2017-07-18 DIAGNOSIS — J029 Acute pharyngitis, unspecified: Secondary | ICD-10-CM | POA: Diagnosis not present

## 2017-07-18 DIAGNOSIS — Z23 Encounter for immunization: Secondary | ICD-10-CM | POA: Diagnosis not present

## 2017-07-29 ENCOUNTER — Other Ambulatory Visit: Payer: Self-pay | Admitting: Family Medicine

## 2017-07-29 MED ORDER — AMOXICILLIN-POT CLAVULANATE 875-125 MG PO TABS: 1.0000 | ORAL_TABLET | Freq: Two times a day (BID) | ORAL | 0 refills | Status: DC

## 2017-08-20 DIAGNOSIS — I2510 Atherosclerotic heart disease of native coronary artery without angina pectoris: Secondary | ICD-10-CM | POA: Diagnosis not present

## 2017-08-20 DIAGNOSIS — R0602 Shortness of breath: Secondary | ICD-10-CM | POA: Diagnosis not present

## 2017-08-20 DIAGNOSIS — I10 Essential (primary) hypertension: Secondary | ICD-10-CM | POA: Diagnosis not present

## 2017-08-20 DIAGNOSIS — E785 Hyperlipidemia, unspecified: Secondary | ICD-10-CM | POA: Diagnosis not present

## 2017-08-29 ENCOUNTER — Ambulatory Visit: Payer: Medicare Other | Admitting: Oncology

## 2017-08-29 ENCOUNTER — Other Ambulatory Visit: Payer: Medicare Other

## 2017-09-03 NOTE — Progress Notes (Signed)
[place]  Telephone:(336) [phone] [phone]) [phone]  ID: [name] OB: Nov 14, 1950  MR#: [id number]  GYI#:[id number]  Patient Care Team: Guadalupe Maple, MD as PCP - General (Family Medicine) Guadalupe Maple, MD as PCP - Family Medicine (Family Medicine) Brendolyn Patty, MD (Dermatology) Earnestine Leys, MD (Specialist) Dionisio David, MD as Consulting Physician (Cardiology) Troxler, Adele Schilder as Attending Physician (Podiatry)  CHIEF COMPLAINT: Secondary polycythemia.  INTERVAL HISTORY: Patient returns to clinic today for repeat laboratory work, further evaluation, and consideration of additional phlebotomy. He continues to feel well and remains asymptomatic. He does not complain of weakness or fatigue. He has no neurologic complaints. Patient denies any recent fevers or illnesses. He has a good appetite and denies weight loss. He has no chest pain or shortness of breath. He denies any nausea, vomiting, constipation, or diarrhea. He has no urinary complaints. Patient feels at his baseline and offers no specific complaints today.  REVIEW OF SYSTEMS:   Review of Systems  Constitutional: Negative.  Negative for fever, malaise/fatigue and weight loss.  Respiratory: Negative.  Negative for cough and shortness of breath.   Cardiovascular: Negative.  Negative for chest pain and leg swelling.  Gastrointestinal: Negative.  Negative for abdominal pain, blood in stool and melena.  Genitourinary: Negative.   Musculoskeletal: Negative.   Skin: Negative.  Negative for rash.  Neurological: Negative.  Negative for sensory change.  Psychiatric/Behavioral: Negative.  The patient is not nervous/anxious.     As per HPI. Otherwise, a complete review of systems is negative.  PAST MEDICAL HISTORY: Past Medical History:  Diagnosis Date  . Coronary artery disease   . Depression   . Hyperlipidemia     PAST SURGICAL HISTORY: Past Surgical History:  Procedure Laterality  Date  . CARDIAC CATHETERIZATION N/A 04/26/2015   Procedure: Left Heart Cath;  Surgeon: Dionisio David, MD;  Location: [place] [facility];  Service: Cardiovascular;  Laterality: N/A;  . CORONARY ANGIOPLASTY    . EYE SURGERY Bilateral    cataract    FAMILY HISTORY: Family History  Problem Relation Age of Onset  . Diabetes Mother   . Hypertension Mother   . Cancer Father     ADVANCED DIRECTIVES (Y/N):  N  HEALTH MAINTENANCE: Social History   Tobacco Use  . Smoking status: Former Smoker    Years: 30.00    Types: Cigars  . Smokeless tobacco: Never Used  Substance Use Topics  . Alcohol use: No  . Drug use: No     Colonoscopy:  PAP:  Bone density:  Lipid panel:  Allergies  Allergen Reactions  . Clonidine Derivatives Shortness Of Breath    Low heart rate    Current Outpatient Medications  Medication Sig Dispense Refill  . amLODipine (NORVASC) 10 MG tablet TAKE ONE TABLET BY MOUTH EVERY DAY 30 tablet 5  . amoxicillin-clavulanate (AUGMENTIN) 875-125 MG tablet Take 1 tablet 2 (two) times daily by mouth. 20 tablet 0  . Cholecalciferol ([employer]S VIT D 5000 HIGH-POTENCY) 5000 units capsule Take 5,000 Units by mouth daily.    . clopidogrel (PLAVIX) 75 MG tablet Take 75 mg by mouth daily.    Marland Kitchen dutasteride (AVODART) 0.5 MG capsule TAKE 1 CAPSULE BY MOUTH EVERY DAY 30 capsule 11  . fluticasone (FLONASE) 50 MCG/ACT nasal spray USE 2 SPRAYS IN EACH NOSTRIL DAILY 16 g 6  . losartan (COZAAR) 100 MG tablet Take 1 tablet (100 mg total) by mouth daily. 90 tablet 2  . meloxicam (MOBIC)  15 MG tablet Take 1 tablet (15 mg total) by mouth daily. 30 tablet 6  . mupirocin ointment (BACTROBAN) 2 % APPLY AS DIRECTED 22 g 1  . niacin (NIASPAN) 1000 MG CR tablet Take 2 tablets (2,000 mg total) by mouth at bedtime. 60 tablet 12  . Omega-3 Fatty Acids (FISH OIL) 1000 MG CAPS Take 1,000 capsules by mouth.    . pantoprazole (PROTONIX) 40 MG tablet 40 mg daily.    . rosuvastatin (CRESTOR) 10 MG tablet Take  10 mg by mouth at bedtime.    . tamsulosin (FLOMAX) 0.4 MG CAPS capsule Take 1 capsule (0.4 mg total) by mouth daily. 30 capsule 12  . testosterone cypionate (DEPOTESTOSTERONE CYPIONATE) 200 MG/ML injection INJECT 0.75 ML INTRAMUSCULARLY EVERY 10 DAYS 10 mL 1  . Turmeric 450 MG CAPS Take 1 capsule by mouth daily.    Marland Kitchen venlafaxine XR (EFFEXOR-XR) 150 MG 24 hr capsule Take 1 capsule (150 mg total) by mouth daily with breakfast. Reported on 11/17/2015 30 capsule 12  . vitamin B-12 (CYANOCOBALAMIN) 1000 MCG tablet Take 1,000 mcg by mouth daily.     No current facility-administered medications for this visit.     OBJECTIVE: Vitals:   09/05/17 1354  BP: (!) 156/83  Pulse: 96  Resp: 20  Temp: (!) 96.5 F (35.8 C)     Body mass index is 31.33 kg/m.    ECOG FS:0 - Asymptomatic  General: Well-developed, well-nourished, no acute distress. Eyes: Pink conjunctiva, anicteric sclera. Lungs: Clear to auscultation bilaterally. Heart: Regular rate and rhythm. No rubs, murmurs, or gallops. Abdomen: Soft, nontender, nondistended. No organomegaly noted, normoactive bowel sounds. Musculoskeletal: No edema, cyanosis, or clubbing. Neuro: Alert, answering all questions appropriately. Cranial nerves grossly intact. Skin: No rashes or petechiae noted. Psych: Normal affect.   LAB RESULTS:  Lab Results  Component Value Date   NA 139 03/12/2017   K 3.8 03/12/2017   CL 101 03/12/2017   CO2 19 (L) 03/12/2017   GLUCOSE 150 (H) 03/12/2017   BUN 20 03/12/2017   CREATININE 1.17 03/12/2017   CALCIUM 8.9 03/12/2017   PROT 6.6 02/05/2017   ALBUMIN 4.5 02/05/2017   AST 20 02/05/2017   ALT 19 02/05/2017   ALKPHOS 51 02/05/2017   BILITOT 0.6 02/05/2017   GFRNONAA 65 03/12/2017   GFRAA 75 03/12/2017    Lab Results  Component Value Date   WBC 5.6 09/05/2017   NEUTROABS 3.0 09/05/2017   HGB 18.4 (H) 09/05/2017   HCT 54.4 (H) 09/05/2017   MCV 93.3 09/05/2017   PLT 200 09/05/2017     STUDIES: No  results found.  ASSESSMENT: Secondary polycythemia  PLAN:    1. 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 Patient's hemoglobin has trended up and is now 18.4.  Proceed with phlebotomy today. Return to clinic in 3 months for laboratory work, further evaluation, and consideration of phlebotomy.   2. Low testosterone: Continue testosterone as per urology. 3. Hypertension: Patient's blood pressure is elevated today, continue monitoring and treatment per primary care.  Patient expressed understanding and was in agreement with this plan. He also understands that He can call clinic at any time with any questions, concerns, or complaints.    Lloyd Huger, MD   09/08/2017 9:31 AM

## 2017-09-05 ENCOUNTER — Other Ambulatory Visit: Payer: Self-pay

## 2017-09-05 ENCOUNTER — Inpatient Hospital Stay (HOSPITAL_BASED_OUTPATIENT_CLINIC_OR_DEPARTMENT_OTHER): Payer: Medicare Other | Admitting: Oncology

## 2017-09-05 ENCOUNTER — Inpatient Hospital Stay: Payer: Medicare Other

## 2017-09-05 ENCOUNTER — Inpatient Hospital Stay: Payer: Medicare Other | Attending: Oncology

## 2017-09-05 VITALS — BP 145/85 | HR 84 | Resp 18

## 2017-09-05 VITALS — BP 156/83 | HR 96 | Temp 96.5°F | Resp 20 | Wt 237.5 lb

## 2017-09-05 DIAGNOSIS — R7989 Other specified abnormal findings of blood chemistry: Secondary | ICD-10-CM | POA: Insufficient documentation

## 2017-09-05 DIAGNOSIS — I10 Essential (primary) hypertension: Secondary | ICD-10-CM

## 2017-09-05 DIAGNOSIS — Z79899 Other long term (current) drug therapy: Secondary | ICD-10-CM

## 2017-09-05 DIAGNOSIS — E291 Testicular hypofunction: Secondary | ICD-10-CM

## 2017-09-05 DIAGNOSIS — F329 Major depressive disorder, single episode, unspecified: Secondary | ICD-10-CM | POA: Diagnosis not present

## 2017-09-05 DIAGNOSIS — D751 Secondary polycythemia: Secondary | ICD-10-CM

## 2017-09-05 DIAGNOSIS — Z809 Family history of malignant neoplasm, unspecified: Secondary | ICD-10-CM | POA: Insufficient documentation

## 2017-09-05 DIAGNOSIS — E785 Hyperlipidemia, unspecified: Secondary | ICD-10-CM | POA: Diagnosis not present

## 2017-09-05 DIAGNOSIS — I2510 Atherosclerotic heart disease of native coronary artery without angina pectoris: Secondary | ICD-10-CM | POA: Insufficient documentation

## 2017-09-05 DIAGNOSIS — Z87891 Personal history of nicotine dependence: Secondary | ICD-10-CM | POA: Insufficient documentation

## 2017-09-05 LAB — CBC WITH DIFFERENTIAL/PLATELET
BASOS ABS: 0 10*3/uL (ref 0–0.1)
Basophils Relative: 1 %
EOS ABS: 0.2 10*3/uL (ref 0–0.7)
EOS PCT: 3 %
HCT: 54.4 % — ABNORMAL HIGH (ref 40.0–52.0)
Hemoglobin: 18.4 g/dL — ABNORMAL HIGH (ref 13.0–18.0)
LYMPHS PCT: 32 %
Lymphs Abs: 1.8 10*3/uL (ref 1.0–3.6)
MCH: 31.6 pg (ref 26.0–34.0)
MCHC: 33.9 g/dL (ref 32.0–36.0)
MCV: 93.3 fL (ref 80.0–100.0)
MONO ABS: 0.6 10*3/uL (ref 0.2–1.0)
Monocytes Relative: 10 %
Neutro Abs: 3 10*3/uL (ref 1.4–6.5)
Neutrophils Relative %: 54 %
PLATELETS: 200 10*3/uL (ref 150–440)
RBC: 5.83 MIL/uL (ref 4.40–5.90)
RDW: 13.9 % (ref 11.5–14.5)
WBC: 5.6 10*3/uL (ref 3.8–10.6)

## 2017-09-05 NOTE — Progress Notes (Signed)
Patient denies any concerns today.  

## 2017-09-16 ENCOUNTER — Ambulatory Visit: Payer: Medicare Other | Admitting: Family Medicine

## 2017-09-30 ENCOUNTER — Ambulatory Visit: Payer: Medicare Other | Admitting: Family Medicine

## 2017-10-11 ENCOUNTER — Ambulatory Visit (INDEPENDENT_AMBULATORY_CARE_PROVIDER_SITE_OTHER): Payer: Medicare Other

## 2017-10-11 VITALS — BP 162/88 | HR 72 | Temp 97.8°F | Resp 16 | Ht 73.0 in | Wt 253.9 lb

## 2017-10-11 DIAGNOSIS — Z23 Encounter for immunization: Secondary | ICD-10-CM | POA: Diagnosis not present

## 2017-10-11 DIAGNOSIS — R972 Elevated prostate specific antigen [PSA]: Secondary | ICD-10-CM | POA: Diagnosis not present

## 2017-10-11 DIAGNOSIS — Z0000 Encounter for general adult medical examination without abnormal findings: Secondary | ICD-10-CM

## 2017-10-11 DIAGNOSIS — I10 Essential (primary) hypertension: Secondary | ICD-10-CM | POA: Diagnosis not present

## 2017-10-11 DIAGNOSIS — E782 Mixed hyperlipidemia: Secondary | ICD-10-CM

## 2017-10-11 LAB — URINALYSIS, ROUTINE W REFLEX MICROSCOPIC
Bilirubin, UA: NEGATIVE
GLUCOSE, UA: NEGATIVE
KETONES UA: NEGATIVE
LEUKOCYTES UA: NEGATIVE
Nitrite, UA: NEGATIVE
Protein, UA: NEGATIVE
SPEC GRAV UA: 1.015 (ref 1.005–1.030)
Urobilinogen, Ur: 0.2 mg/dL (ref 0.2–1.0)
pH, UA: 7 (ref 5.0–7.5)

## 2017-10-11 LAB — MICROSCOPIC EXAMINATION: Bacteria, UA: NONE SEEN

## 2017-10-11 NOTE — Progress Notes (Signed)
Subjective:   Kenneth Pruitt is a 67 y.o. male who presents for an Initial Medicare Annual Wellness Visit.  Review of Systems   Cardiac Risk Factors include: advanced age (>69men, >54 women);hypertension;male gender;obesity (BMI >30kg/m2);dyslipidemia    Objective:    Today's Vitals   10/11/17 0816  BP: (!) 162/88  Pulse: 72  Resp: 16  Temp: 97.8 F (36.6 C)  TempSrc: Temporal  Weight: 253 lb 14.4 oz (115.2 kg)  Height: 6\' 1"  (1.854 m)   Body mass index is 33.5 kg/m.  Advanced Directives 10/11/2017 08/29/2016 02/02/2016 04/26/2015  Does Patient Have a Medical Advance Directive? No Yes No No  Type of Advance Directive - Living will - -  Would patient like information on creating a medical advance directive? Yes (MAU/Ambulatory/Procedural Areas - Information given) - Yes - Educational materials given Yes - Scientist, clinical (histocompatibility and immunogenetics) given;No - patient declined information    Current Medications (verified) Outpatient Encounter Medications as of 10/11/2017  Medication Sig  . amLODipine (NORVASC) 10 MG tablet TAKE ONE TABLET BY MOUTH EVERY DAY  . Cholecalciferol ([employer] VIT D 5000 HIGH-POTENCY) 5000 units capsule Take 5,000 Units by mouth daily.  . clopidogrel (PLAVIX) 75 MG tablet Take 75 mg by mouth daily.  Marland Kitchen dutasteride (AVODART) 0.5 MG capsule TAKE 1 CAPSULE BY MOUTH EVERY DAY  . hydrALAZINE (APRESOLINE) 25 MG tablet   . losartan (COZAAR) 100 MG tablet Take 1 tablet (100 mg total) by mouth daily.  . meloxicam (MOBIC) 15 MG tablet Take 1 tablet (15 mg total) by mouth daily.  . mupirocin ointment (BACTROBAN) 2 % APPLY AS DIRECTED  . niacin (NIASPAN) 1000 MG CR tablet Take 2 tablets (2,000 mg total) by mouth at bedtime.  . Omega-3 Fatty Acids (FISH OIL) 1000 MG CAPS Take 1,000 capsules by mouth.  . pantoprazole (PROTONIX) 40 MG tablet 40 mg daily.  . rosuvastatin (CRESTOR) 10 MG tablet Take 10 mg by mouth at bedtime.  . tamsulosin (FLOMAX) 0.4 MG CAPS capsule Take 1 capsule (0.4 mg  total) by mouth daily.  Marland Kitchen testosterone cypionate (DEPOTESTOSTERONE CYPIONATE) 200 MG/ML injection INJECT 0.75 ML INTRAMUSCULARLY EVERY 10 DAYS  . Turmeric 450 MG CAPS Take 1 capsule by mouth daily.  Marland Kitchen venlafaxine XR (EFFEXOR-XR) 150 MG 24 hr capsule Take 1 capsule (150 mg total) by mouth daily with breakfast. Reported on 11/17/2015  . vitamin B-12 (CYANOCOBALAMIN) 1000 MCG tablet Take 1,000 mcg by mouth daily.  . fluticasone (FLONASE) 50 MCG/ACT nasal spray USE 2 SPRAYS IN EACH NOSTRIL DAILY (Patient not taking: Reported on 10/11/2017)  . [DISCONTINUED] amoxicillin-clavulanate (AUGMENTIN) 875-125 MG tablet Take 1 tablet 2 (two) times daily by mouth. (Patient not taking: Reported on 10/11/2017)   No facility-administered encounter medications on file as of 10/11/2017.     Allergies (verified) Clonidine derivatives   History: Past Medical History:  Diagnosis Date  . Coronary artery disease   . Depression   . Hyperlipidemia    Past Surgical History:  Procedure Laterality Date  . CARDIAC CATHETERIZATION N/A 04/26/2015   Procedure: Left Heart Cath;  Surgeon: Dionisio David, MD;  Location: [facility];  Service: Cardiovascular;  Laterality: N/A;  . CORONARY ANGIOPLASTY    . EYE SURGERY Bilateral    cataract   Family History  Problem Relation Age of Onset  . Diabetes Mother   . Hypertension Mother   . Cancer Father    Social History   Socioeconomic History  . Marital status: Married    Spouse  name: None  . Number of children: None  . Years of education: None  . Highest education level: None  Social Needs  . Financial resource strain: Not hard at all  . Food insecurity - worry: Never true  . Food insecurity - inability: Never true  . Transportation needs - medical: No  . Transportation needs - non-medical: No  Occupational History  . None  Tobacco Use  . Smoking status: Former Smoker    Years: 30.00    Types: Cigars    Last attempt to quit: 07/18/2006    Years since  quitting: 11.2  . Smokeless tobacco: Never Used  Substance and Sexual Activity  . Alcohol use: No  . Drug use: No  . Sexual activity: None  Other Topics Concern  . None  Social History Narrative  . None   Tobacco Counseling Counseling given: Not Answered   Clinical Intake:  Pre-visit preparation completed: Yes        Nutritional Status: BMI > 30  Obese Nutritional Risks: None Diabetes: No  How often do you need to have someone help you when you read instructions, pamphlets, or other written materials from your doctor or pharmacy?: 1 - Never What is the last grade level you completed in school?: masters degree  Interpreter Needed?: No  Information entered by :: Tiffany Hill,LPN  Activities of Daily Living In your present state of health, do you have any difficulty performing the following activities: 10/11/2017  Hearing? N  Vision? N  Difficulty concentrating or making decisions? Y  Walking or climbing stairs? N  Dressing or bathing? N  Doing errands, shopping? N  Preparing Food and eating ? N  Using the Toilet? N  In the past six months, have you accidently leaked urine? N  Do you have problems with loss of bowel control? N  Managing your Medications? N  Managing your Finances? N  Housekeeping or managing your Housekeeping? N  Some recent data might be hidden     Immunizations and Health Maintenance Immunization History  Administered Date(s) Administered  . Influenza-Unspecified 07/13/2016  . Pneumococcal Conjugate-13 10/11/2017  . Tdap 07/30/2011  . Zoster 07/30/2011   There are no preventive care reminders to display for this patient.  Patient Care Team: Guadalupe Maple, MD as PCP - General (Family Medicine) Guadalupe Maple, MD as PCP - Family Medicine (Family Medicine) Brendolyn Patty, MD (Dermatology) Earnestine Leys, MD (Specialist) Dionisio David, MD as Consulting Physician (Cardiology) Elvina Mattes, Adele Schilder as Attending Physician  (Podiatry)  Indicate any recent Medical Services you may have received from other than [facility] providers in the past year (date may be approximate).    Assessment:   This is a routine wellness examination for Kenneth Pruitt.  Hearing/Vision screen Vision Screening Comments: Goes to eye doctor in [place] annually   Dietary issues and exercise activities discussed: Current Exercise Habits: Home exercise routine, Intensity: Mild, Exercise limited by: None identified  Goals    . DIET - INCREASE WATER INTAKE     Recommend drinking at least 6-8 glasses of water a day       Depression Screen PHQ 2/9 Scores 10/11/2017 03/12/2017 02/02/2016  PHQ - 2 Score 6 1 1   PHQ- 9 Score 24 - -    Fall Risk Fall Risk  10/11/2017 03/12/2017 02/02/2016  Falls in the past year? No No No    Is the patient's home free of loose throw rugs in walkways, pet beds, electrical cords, etc?   no  Grab bars in the bathroom? yes      Handrails on the stairs?   yes      Adequate lighting?   yes  Timed Get Up and Go performed: completed in 8 seconds with no use of assistive devices, steady gait. No intervention needed at this time.   Cognitive Function:     6CIT Screen 10/11/2017  What Year? 0 points  What month? 0 points  What time? 0 points  Count back from 20 0 points  Months in reverse 0 points  Repeat phrase 0 points  Total Score 0    Screening Tests Health Maintenance  Topic Date Due  . COLONOSCOPY  11/06/2017  . PNA vac Low Risk Adult (2 of 2 - PPSV23) 10/11/2018  . TETANUS/TDAP  07/29/2021  . INFLUENZA VACCINE  Completed  . Hepatitis C Screening  Completed    Qualifies for Shingles Vaccine? Discussed shingrix vaccine  Cancer Screenings: Lung: Low Dose CT Chest recommended if Age 76-80 years, 30 pack-year currently smoking OR have quit w/in 15years. Patient does not qualify. Colorectal: completed 11/07/2007  Additional Screenings:  Hepatitis B/HIV/Syphillis: not indicated Hepatitis C  Screening: completed 11/17/2015      Plan:    I have personally reviewed and addressed the Medicare Annual Wellness questionnaire and have noted the following in the patient's chart:  A. Medical and social history B. Use of alcohol, tobacco or illicit drugs  C. Current medications and supplements D. Functional ability and status E.  Nutritional status F.  Physical activity G. Advance directives H. List of other physicians I.  Hospitalizations, surgeries, and ER visits in previous 12 months J.  [place] such as hearing and vision if needed, cognitive and depression L. Referrals and appointments   In addition, I have reviewed and discussed with patient certain preventive protocols, quality metrics, and best practice recommendations. A written personalized care plan for preventive services as well as general preventive health recommendations were provided to patient.   Signed,  Tyler Aas, LPN Nurse Health Advisor   Nurse Notes: none

## 2017-10-11 NOTE — Patient Instructions (Addendum)
Mr. Christopherson , Thank you for taking time to come for your Medicare Wellness Visit. I appreciate your ongoing commitment to your health goals. Please review the following plan we discussed and let me know if I can assist you in the future.   Screening recommendations/referrals: Colonoscopy: completed 11/07/2007 Recommended yearly ophthalmology/optometry visit for glaucoma screening and checkup Recommended yearly dental visit for hygiene and checkup  Vaccinations: Influenza vaccine: up to date  Pneumococcal vaccine: prevnar 13 done today, pneumovax 23 due 10/12/2018 Tdap vaccine: up to date Shingles vaccine: up to date  Advanced directives: Advance directive discussed with you today. I have provided a copy for you to complete at home and have notarized. Once this is complete please bring a copy in to our office so we can scan it into your chart.  Conditions/risks identified: Recommend drinking at least 6-8 glasses of water a day   Next appointment: Follow up in one year for your annual wellness exam.   Preventive Care 65 Years and Older, Male Preventive care refers to lifestyle choices and visits with your health care provider that can promote health and wellness. What does preventive care include?  A yearly physical exam. This is also called an annual well check.  Dental exams once or twice a year.  Routine eye exams. Ask your health care provider how often you should have your eyes checked.  Personal lifestyle choices, including:  Daily care of your teeth and gums.  Regular physical activity.  Eating a healthy diet.  Avoiding tobacco and drug use.  Limiting alcohol use.  Practicing safe sex.  Taking low doses of aspirin every day.  Taking vitamin and mineral supplements as recommended by your health care provider. What happens during an annual well check? The services and screenings done by your health care provider during your annual well check will depend on your age,  overall health, lifestyle risk factors, and family history of disease. Counseling  Your health care provider may ask you questions about your:  Alcohol use.  Tobacco use.  Drug use.  Emotional well-being.  Home and relationship well-being.  Sexual activity.  Eating habits.  History of falls.  Memory and ability to understand (cognition).  Work and work Statistician. Screening  You may have the following tests or measurements:  Height, weight, and BMI.  Blood pressure.  Lipid and cholesterol levels. These may be checked every 5 years, or more frequently if you are over 57 years old.  Skin check.  Lung cancer screening. You may have this screening every year starting at age 76 if you have a 30-pack-year history of smoking and currently smoke or have quit within the past 15 years.  Fecal occult blood test (FOBT) of the stool. You may have this test every year starting at age 95.  Flexible sigmoidoscopy or colonoscopy. You may have a sigmoidoscopy every 5 years or a colonoscopy every 10 years starting at age 64.  Prostate cancer screening. Recommendations will vary depending on your family history and other risks.  Hepatitis C blood test.  Hepatitis B blood test.  Sexually transmitted disease (STD) testing.  Diabetes screening. This is done by checking your blood sugar (glucose) after you have not eaten for a while (fasting). You may have this done every 1-3 years.  Abdominal aortic aneurysm (AAA) screening. You may need this if you are a current or former smoker.  Osteoporosis. You may be screened starting at age 45 if you are at high risk. Talk with your health  care provider about your test results, treatment options, and if necessary, the need for more tests. Vaccines  Your health care provider may recommend certain vaccines, such as:  Influenza vaccine. This is recommended every year.  Tetanus, diphtheria, and acellular pertussis (Tdap, Td) vaccine. You may  need a Td booster every 10 years.  Zoster vaccine. You may need this after age 43.  Pneumococcal 13-valent conjugate (PCV13) vaccine. One dose is recommended after age 53.  Pneumococcal polysaccharide (PPSV23) vaccine. One dose is recommended after age 57. Talk to your health care provider about which screenings and vaccines you need and how often you need them. This information is not intended to replace advice given to you by your health care provider. Make sure you discuss any questions you have with your health care provider. Document Released: 09/30/2015 Document Revised: 05/23/2016 Document Reviewed: 07/05/2015 Elsevier Interactive Patient Education  2017 [place] Prevention in the Home Falls can cause injuries. They can happen to people of all ages. There are many things you can do to make your home safe and to help prevent falls. What can I do on the outside of my home?  Regularly fix the edges of walkways and driveways and fix any cracks.  Remove anything that might make you trip as you walk through a door, such as a raised step or threshold.  Trim any bushes or trees on the path to your home.  Use bright outdoor lighting.  Clear any walking paths of anything that might make someone trip, such as rocks or tools.  Regularly check to see if handrails are loose or broken. Make sure that both sides of any steps have handrails.  Any raised decks and porches should have guardrails on the edges.  Have any leaves, snow, or ice cleared regularly.  Use sand or salt on walking paths during winter.  Clean up any spills in your garage right away. This includes oil or grease spills. What can I do in the bathroom?  Use night lights.  Install grab bars by the toilet and in the tub and shower. Do not use towel bars as grab bars.  Use non-skid mats or decals in the tub or shower.  If you need to sit down in the shower, use a plastic, non-slip stool.  Keep the floor  dry. Clean up any water that spills on the floor as soon as it happens.  Remove soap buildup in the tub or shower regularly.  Attach bath mats securely with double-sided non-slip rug tape.  Do not have throw rugs and other things on the floor that can make you trip. What can I do in the bedroom?  Use night lights.  Make sure that you have a light by your bed that is easy to reach.  Do not use any sheets or blankets that are too big for your bed. They should not hang down onto the floor.  Have a firm chair that has side arms. You can use this for support while you get dressed.  Do not have throw rugs and other things on the floor that can make you trip. What can I do in the kitchen?  Clean up any spills right away.  Avoid walking on wet floors.  Keep items that you use a lot in easy-to-reach places.  If you need to reach something above you, use a strong step stool that has a grab bar.  Keep electrical cords out of the way.  Do not use floor  polish or wax that makes floors slippery. If you must use wax, use non-skid floor wax.  Do not have throw rugs and other things on the floor that can make you trip. What can I do with my stairs?  Do not leave any items on the stairs.  Make sure that there are handrails on both sides of the stairs and use them. Fix handrails that are broken or loose. Make sure that handrails are as long as the stairways.  Check any carpeting to make sure that it is firmly attached to the stairs. Fix any carpet that is loose or worn.  Avoid having throw rugs at the top or bottom of the stairs. If you do have throw rugs, attach them to the floor with carpet tape.  Make sure that you have a light switch at the top of the stairs and the bottom of the stairs. If you do not have them, ask someone to add them for you. What else can I do to help prevent falls?  Wear shoes that:  Do not have high heels.  Have rubber bottoms.  Are comfortable and fit you  well.  Are closed at the toe. Do not wear sandals.  If you use a stepladder:  Make sure that it is fully opened. Do not climb a closed stepladder.  Make sure that both sides of the stepladder are locked into place.  Ask someone to hold it for you, if possible.  Clearly mark and make sure that you can see:  Any grab bars or handrails.  First and last steps.  Where the edge of each step is.  Use tools that help you move around (mobility aids) if they are needed. These include:  Canes.  Walkers.  Scooters.  Crutches.  Turn on the lights when you go into a dark area. Replace any light bulbs as soon as they burn out.  Set up your furniture so you have a clear path. Avoid moving your furniture around.  If any of your floors are uneven, fix them.  If there are any pets around you, be aware of where they are.  Review your medicines with your doctor. Some medicines can make you feel dizzy. This can increase your chance of falling. Ask your doctor what other things that you can do to help prevent falls. This information is not intended to replace advice given to you by your health care provider. Make sure you discuss any questions you have with your health care provider. Document Released: 06/30/2009 Document Revised: 02/09/2016 Document Reviewed: 10/08/2014 Elsevier Interactive Patient Education  2017 [employer]  Pneumococcal Conjugate Vaccine (PCV13) What You Need to Know 1. Why get vaccinated? Vaccination can protect both children and adults from pneumococcal disease. Pneumococcal disease is caused by bacteria that can spread from person to person through close contact. It can cause ear infections, and it can also lead to more serious infections of the:  Lungs (pneumonia),  Blood (bacteremia), and  Covering of the brain and spinal cord (meningitis).  Pneumococcal pneumonia is most common among adults. Pneumococcal meningitis can cause deafness and brain damage, and  it kills about 1 child in 10 who get it. Anyone can get pneumococcal disease, but children under 70 years of age and adults 61 years and older, people with certain medical conditions, and cigarette smokers are at the highest risk. Before there was a vaccine, the [place] saw:  more than 700 cases of meningitis,  about 13,000 blood infections,  about  5 million ear infections, and  about 200 deaths  in children under 5 each year from pneumococcal disease. Since vaccine became available, severe pneumococcal disease in these children has fallen by 88%. About 18,000 older adults die of pneumococcal disease each year in the [place]. Treatment of pneumococcal infections with penicillin and other drugs is not as effective as it used to be, because some strains of the disease have become resistant to these drugs. This makes prevention of the disease, through vaccination, even more important. 2. PCV13 vaccine Pneumococcal conjugate vaccine (called PCV13) protects against 13 types of pneumococcal bacteria. PCV13 is routinely given to children at 2, 4, 6, and 78-34 months of age. It is also recommended for children and adults 12 to 36 years of age with certain health conditions, and for all adults 39 years of age and older. Your doctor can give you details. 3. Some people should not get this vaccine Anyone who has ever had a life-threatening allergic reaction to a dose of this vaccine, to an earlier pneumococcal vaccine called PCV7, or to any vaccine containing diphtheria toxoid (for example, DTaP), should not get PCV13. Anyone with a severe allergy to any component of PCV13 should not get the vaccine. Tell your doctor if the person being vaccinated has any severe allergies. If the person scheduled for vaccination is not feeling well, your healthcare provider might decide to reschedule the shot on another day. 4. Risks of a vaccine reaction With any medicine, including vaccines, there is a chance  of reactions. These are usually mild and go away on their own, but serious reactions are also possible. Problems reported following PCV13 varied by age and dose in the series. The most common problems reported among children were:  About half became drowsy after the shot, had a temporary loss of appetite, or had redness or tenderness where the shot was given.  About 1 out of 3 had swelling where the shot was given.  About 1 out of 3 had a mild fever, and about 1 in 20 had a fever over 102.62F.  Up to about 8 out of 10 became fussy or irritable.  Adults have reported pain, redness, and swelling where the shot was given; also mild fever, fatigue, headache, chills, or muscle pain. Young children who get PCV13 along with inactivated flu vaccine at the same time may be at increased risk for seizures caused by fever. Ask your doctor for more information. Problems that could happen after any vaccine:  People sometimes faint after a medical procedure, including vaccination. Sitting or lying down for about 15 minutes can help prevent fainting, and injuries caused by a fall. Tell your doctor if you feel dizzy, or have vision changes or ringing in the ears.  Some older children and adults get severe pain in the shoulder and have difficulty moving the arm where a shot was given. This happens very rarely.  Any medication can cause a severe allergic reaction. Such reactions from a vaccine are very rare, estimated at about 1 in a million doses, and would happen within a few minutes to a few hours after the vaccination. As with any medicine, there is a very small chance of a vaccine causing a serious injury or death. The safety of vaccines is always being monitored. For more information, visit: http://www.aguilar.org/ 5. What if there is a serious reaction? What should I look for? Look for anything that concerns you, such as signs of a severe allergic reaction, very high fever, or  unusual behavior. Signs  of a severe allergic reaction can include hives, swelling of the face and throat, difficulty breathing, a fast heartbeat, dizziness, and weakness-usually within a few minutes to a few hours after the vaccination. What should I do?  If you think it is a severe allergic reaction or other emergency that can't wait, call 9-1-1 or get the person to the nearest hospital. Otherwise, call your doctor.  Reactions should be reported to the Vaccine Adverse Event Reporting System (VAERS). Your doctor should file this report, or you can do it yourself through the VAERS web site at www.vaers.SamedayNews.es, or by calling 986-313-0961. ? VAERS does not give medical advice. 6. The National Vaccine Injury Compensation Program The [employer] Vaccine Injury Compensation Program (VICP) is a federal program that was created to compensate people who may have been injured by certain vaccines. Persons who believe they may have been injured by a vaccine can learn about the program and about filing a claim by calling 3072929596 or visiting the [place] website at GoldCloset.com.ee. There is a time limit to file a claim for compensation. 7. How can I learn more?  Ask your healthcare provider. He or she can give you the vaccine package insert or suggest other sources of information.  Call your local or state health department.  Contact the Centers for Disease Control and Prevention (CDC): ? Call 820-206-8364 (1-800-CDC-INFO) or ? Visit CDC's website at http://hunter.com/ Vaccine Information Statement, PCV13 Vaccine (07/22/2014) This information is not intended to replace advice given to you by your health care provider. Make sure you discuss any questions you have with your health care provider. Document Released: 07/01/2006 Document Revised: 05/24/2016 Document Reviewed: 05/24/2016 Elsevier Interactive Patient Education  2017 [employer].

## 2017-10-12 LAB — COMPREHENSIVE METABOLIC PANEL
A/G RATIO: 2.1 (ref 1.2–2.2)
ALT: 23 IU/L (ref 0–44)
AST: 22 IU/L (ref 0–40)
Albumin: 4.5 g/dL (ref 3.6–4.8)
Alkaline Phosphatase: 53 IU/L (ref 39–117)
BILIRUBIN TOTAL: 0.6 mg/dL (ref 0.0–1.2)
BUN/Creatinine Ratio: 16 (ref 10–24)
BUN: 15 mg/dL (ref 8–27)
CALCIUM: 9.2 mg/dL (ref 8.6–10.2)
CO2: 24 mmol/L (ref 20–29)
Chloride: 101 mmol/L (ref 96–106)
Creatinine, Ser: 0.92 mg/dL (ref 0.76–1.27)
GFR, EST AFRICAN AMERICAN: 100 mL/min/{1.73_m2} (ref >59)
GFR, EST NON AFRICAN AMERICAN: 86 mL/min/{1.73_m2} (ref >59)
GLOBULIN, TOTAL: 2.1 g/dL (ref 1.5–4.5)
Glucose: 99 mg/dL (ref 65–99)
POTASSIUM: 3.9 mmol/L (ref 3.5–5.2)
SODIUM: 140 mmol/L (ref 134–144)
Total Protein: 6.6 g/dL (ref 6.0–8.5)

## 2017-10-12 LAB — CBC WITH DIFFERENTIAL/PLATELET
BASOS ABS: 0 10*3/uL (ref 0.0–0.2)
Basos: 0 %
EOS (ABSOLUTE): 0.1 10*3/uL (ref 0.0–0.4)
EOS: 1 %
HEMATOCRIT: 53.8 % — AB (ref 37.5–51.0)
HEMOGLOBIN: 18.2 g/dL — AB (ref 13.0–17.7)
IMMATURE GRANS (ABS): 0 10*3/uL (ref 0.0–0.1)
Immature Granulocytes: 0 %
LYMPHS: 25 %
Lymphocytes Absolute: 1.8 10*3/uL (ref 0.7–3.1)
MCH: 31.7 pg (ref 26.6–33.0)
MCHC: 33.8 g/dL (ref 31.5–35.7)
MCV: 94 fL (ref 79–97)
MONOCYTES: 12 %
Monocytes Absolute: 0.8 10*3/uL (ref 0.1–0.9)
NEUTROS ABS: 4.4 10*3/uL (ref 1.4–7.0)
Neutrophils: 62 %
Platelets: 217 10*3/uL (ref 150–379)
RBC: 5.74 x10E6/uL (ref 4.14–5.80)
RDW: 14.6 % (ref 12.3–15.4)
WBC: 7.1 10*3/uL (ref 3.4–10.8)

## 2017-10-12 LAB — PSA: Prostate Specific Ag, Serum: 2.3 ng/mL (ref 0.0–4.0)

## 2017-10-14 ENCOUNTER — Encounter: Payer: Self-pay | Admitting: Family Medicine

## 2017-10-16 ENCOUNTER — Encounter: Payer: Self-pay | Admitting: Family Medicine

## 2017-10-16 ENCOUNTER — Ambulatory Visit (INDEPENDENT_AMBULATORY_CARE_PROVIDER_SITE_OTHER): Payer: Medicare Other | Admitting: Family Medicine

## 2017-10-16 VITALS — BP 150/80 | HR 94 | Wt 254.0 lb

## 2017-10-16 DIAGNOSIS — L989 Disorder of the skin and subcutaneous tissue, unspecified: Secondary | ICD-10-CM

## 2017-10-16 DIAGNOSIS — N401 Enlarged prostate with lower urinary tract symptoms: Secondary | ICD-10-CM | POA: Diagnosis not present

## 2017-10-16 DIAGNOSIS — D751 Secondary polycythemia: Secondary | ICD-10-CM

## 2017-10-16 DIAGNOSIS — Z0000 Encounter for general adult medical examination without abnormal findings: Secondary | ICD-10-CM

## 2017-10-16 DIAGNOSIS — I2510 Atherosclerotic heart disease of native coronary artery without angina pectoris: Secondary | ICD-10-CM | POA: Diagnosis not present

## 2017-10-16 DIAGNOSIS — F331 Major depressive disorder, recurrent, moderate: Secondary | ICD-10-CM

## 2017-10-16 DIAGNOSIS — Z0001 Encounter for general adult medical examination with abnormal findings: Secondary | ICD-10-CM

## 2017-10-16 DIAGNOSIS — N400 Enlarged prostate without lower urinary tract symptoms: Secondary | ICD-10-CM | POA: Insufficient documentation

## 2017-10-16 DIAGNOSIS — R350 Frequency of micturition: Secondary | ICD-10-CM

## 2017-10-16 DIAGNOSIS — R972 Elevated prostate specific antigen [PSA]: Secondary | ICD-10-CM | POA: Diagnosis not present

## 2017-10-16 DIAGNOSIS — I10 Essential (primary) hypertension: Secondary | ICD-10-CM

## 2017-10-16 DIAGNOSIS — Z7189 Other specified counseling: Secondary | ICD-10-CM | POA: Diagnosis not present

## 2017-10-16 DIAGNOSIS — E782 Mixed hyperlipidemia: Secondary | ICD-10-CM

## 2017-10-16 MED ORDER — AMLODIPINE BESYLATE 10 MG PO TABS: 10.0000 mg | ORAL_TABLET | Freq: Every day | ORAL | 4 refills | Status: DC

## 2017-10-16 MED ORDER — DUTASTERIDE 0.5 MG PO CAPS: 0.5000 mg | ORAL_CAPSULE | Freq: Every day | ORAL | 4 refills | Status: DC

## 2017-10-16 MED ORDER — TAMSULOSIN HCL 0.4 MG PO CAPS: 0.4000 mg | ORAL_CAPSULE | Freq: Every day | ORAL | 4 refills | Status: DC

## 2017-10-16 MED ORDER — HYDROCHLOROTHIAZIDE 25 MG PO TABS
25.0000 mg | ORAL_TABLET | Freq: Every day | ORAL | 4 refills | Status: DC
Start: 2017-10-16 — End: 2018-01-15

## 2017-10-16 MED ORDER — VENLAFAXINE HCL ER 150 MG PO CP24: 150.0000 mg | ORAL_CAPSULE | Freq: Every day | ORAL | 12 refills | Status: DC

## 2017-10-16 MED ORDER — MELOXICAM 15 MG PO TABS: 15.0000 mg | ORAL_TABLET | Freq: Every day | ORAL | 6 refills | Status: DC

## 2017-10-16 MED ORDER — METOPROLOL SUCCINATE ER 25 MG PO TB24: 25.0000 mg | ORAL_TABLET | Freq: Every day | ORAL | 2 refills | Status: DC

## 2017-10-16 MED ORDER — NIACIN ER (ANTIHYPERLIPIDEMIC) 1000 MG PO TBCR: 2000.0000 mg | EXTENDED_RELEASE_TABLET | Freq: Every day | ORAL | 4 refills | Status: DC

## 2017-10-16 MED ORDER — LOSARTAN POTASSIUM 100 MG PO TABS: 100.0000 mg | ORAL_TABLET | Freq: Every day | ORAL | 4 refills | Status: DC

## 2017-10-16 NOTE — Assessment & Plan Note (Signed)
The current medical regimen is effective;  continue present plan and medications.  

## 2017-10-16 NOTE — Addendum Note (Signed)
Addended by: Golden Pop A on: 10/16/2017 11:05 AM   Modules accepted: Orders

## 2017-10-16 NOTE — Assessment & Plan Note (Signed)
Will start metoprolol 25

## 2017-10-16 NOTE — Assessment & Plan Note (Signed)
Followed by cardiology 

## 2017-10-16 NOTE — Progress Notes (Addendum)
BP (!) 150/80   Pulse 94   Wt 254 lb (115.2 kg)   SpO2 99%   BMI 33.51 kg/m    Subjective:    Patient ID: Kenneth Pruitt, male    DOB: 1951-06-27, 67 y.o.   MRN: [id number]  HPI: Kenneth Pruitt is a 68 y.o. male  Chief Complaint  Patient presents with  . Annual Exam  Patient all in all doing okay but having tough times with nerves anxiety with mother's failing health and in a nursing home a lot of home stress also taking Effexor without problems but things seem to be really piling up. Blood pressure also having trouble with stress and anxiety as noted above affecting blood pressure now taking hydralazine 1 a day blood pressure seems to still be elevated. Some question whether taking clonidine or not. Cholesterol and good reports from cardiology. Taking testosterone injections without problems except for polycythemia and having occasional blood draws to lower hemoglobin.  Relevant past medical, surgical, family and social history reviewed and updated as indicated. Interim medical history since our last visit reviewed. Allergies and medications reviewed and updated.  Review of Systems  Constitutional: Negative.   HENT: Negative.   Eyes: Negative.   Respiratory: Negative.   Cardiovascular: Negative.   Gastrointestinal: Negative.   Endocrine: Negative.   Genitourinary: Negative.   Musculoskeletal: Negative.   Skin: Negative.   Allergic/Immunologic: Negative.   Neurological: Negative.   Hematological: Negative.   Psychiatric/Behavioral: Negative.     Per HPI unless specifically indicated above     Objective:    BP (!) 150/80   Pulse 94   Wt 254 lb (115.2 kg)   SpO2 99%   BMI 33.51 kg/m   Wt Readings from Last 3 Encounters:  10/16/17 254 lb (115.2 kg)  10/11/17 253 lb 14.4 oz (115.2 kg)  09/05/17 237 lb 8 oz (107.7 kg)    Physical Exam  Constitutional: He is oriented to person, place, and time. He appears well-developed and well-nourished.  HENT:  Head:  Normocephalic and atraumatic.  Right Ear: External ear normal.  Left Ear: External ear normal.  Eyes: Conjunctivae and EOM are normal. Pupils are equal, round, and reactive to light.  Neck: Normal range of motion. Neck supple.  Cardiovascular: Normal rate, regular rhythm, normal heart sounds and intact distal pulses.  Pulmonary/Chest: Effort normal and breath sounds normal.  Abdominal: Soft. Bowel sounds are normal. There is no splenomegaly or hepatomegaly.  Genitourinary: Rectum normal and penis normal.  Genitourinary Comments: BPH changes  Musculoskeletal: Normal range of motion.  Neurological: He is alert and oriented to person, place, and time. He has normal reflexes.  Skin: No rash noted. No erythema.  AK on face and hands  Psychiatric: He has a normal mood and affect. His behavior is normal. Judgment and thought content normal.    Results for orders placed or performed in visit on 10/11/17  Microscopic Examination  Result Value Ref Range   WBC, UA 0-5 0 - 5 /hpf   RBC, UA 0-2 0 - 2 /hpf   Epithelial Cells (non renal) 0-10 0 - 10 /hpf   Bacteria, UA None seen None seen/Few  CBC with Differential  Result Value Ref Range   WBC 7.1 3.4 - 10.8 x10E3/uL   RBC 5.74 4.14 - 5.80 x10E6/uL   Hemoglobin 18.2 (H) 13.0 - 17.7 g/dL   Hematocrit 53.8 (H) 37.5 - 51.0 %   MCV 94 79 - 97 fL   MCH 31.7  26.6 - 33.0 pg   MCHC 33.8 31.5 - 35.7 g/dL   RDW 14.6 12.3 - 15.4 %   Platelets 217 150 - 379 x10E3/uL   Neutrophils 62 Not Estab. %   Lymphs 25 Not Estab. %   Monocytes 12 Not Estab. %   Eos 1 Not Estab. %   Basos 0 Not Estab. %   Neutrophils Absolute 4.4 1.4 - 7.0 x10E3/uL   Lymphocytes Absolute 1.8 0.7 - 3.1 x10E3/uL   Monocytes Absolute 0.8 0.1 - 0.9 x10E3/uL   EOS (ABSOLUTE) 0.1 0.0 - 0.4 x10E3/uL   Basophils Absolute 0.0 0.0 - 0.2 x10E3/uL   Immature Granulocytes 0 Not Estab. %   Immature Grans (Abs) 0.0 0.0 - 0.1 x10E3/uL  Comp Met (CMET)  Result Value Ref Range   Glucose 99  65 - 99 mg/dL   BUN 15 8 - 27 mg/dL   Creatinine, Ser 0.92 0.76 - 1.27 mg/dL   GFR calc non Af Amer 86 >59 mL/min/1.73   GFR calc Af Amer 100 >59 mL/min/1.73   BUN/Creatinine Ratio 16 10 - 24   Sodium 140 134 - 144 mmol/L   Potassium 3.9 3.5 - 5.2 mmol/L   Chloride 101 96 - 106 mmol/L   CO2 24 20 - 29 mmol/L   Calcium 9.2 8.6 - 10.2 mg/dL   Total Protein 6.6 6.0 - 8.5 g/dL   Albumin 4.5 3.6 - 4.8 g/dL   Globulin, Total 2.1 1.5 - 4.5 g/dL   Albumin/Globulin Ratio 2.1 1.2 - 2.2   Bilirubin Total 0.6 0.0 - 1.2 mg/dL   Alkaline Phosphatase 53 39 - 117 IU/L   AST 22 0 - 40 IU/L   ALT 23 0 - 44 IU/L  Urinalysis, Routine w reflex microscopic  Result Value Ref Range   Specific Gravity, UA 1.015 1.005 - 1.030   pH, UA 7.0 5.0 - 7.5   Color, UA Yellow Yellow   Appearance Ur Clear Clear   Leukocytes, UA Negative Negative   Protein, UA Negative Negative/Trace   Glucose, UA Negative Negative   Ketones, UA Negative Negative   RBC, UA Trace (A) Negative   Bilirubin, UA Negative Negative   Urobilinogen, Ur 0.2 0.2 - 1.0 mg/dL   Nitrite, UA Negative Negative   Microscopic Examination See below:   PSA  Result Value Ref Range   Prostate Specific Ag, Serum 2.3 0.0 - 4.0 ng/mL      Assessment & Plan:   Problem List Items Addressed This Visit      Cardiovascular and Mediastinum   Coronary artery disease    Followed by cardiology      Relevant Medications   amLODipine (NORVASC) 10 MG tablet   losartan (COZAAR) 100 MG tablet   hydrochlorothiazide (HYDRODIURIL) 25 MG tablet   niacin (NIASPAN) 1000 MG CR tablet   metoprolol succinate (TOPROL-XL) 25 MG 24 hr tablet   Essential hypertension    Will start metoprolol 25      Relevant Medications   amLODipine (NORVASC) 10 MG tablet   losartan (COZAAR) 100 MG tablet   hydrochlorothiazide (HYDRODIURIL) 25 MG tablet   niacin (NIASPAN) 1000 MG CR tablet   metoprolol succinate (TOPROL-XL) 25 MG 24 hr tablet     Musculoskeletal and  Integument   Skin lesions    Mult AK and will derm referal      Relevant Orders   Ambulatory referral to Dermatology     Genitourinary   BPH (benign prostatic hyperplasia)    The  current medical regimen is effective;  continue present plan and medications.       Relevant Medications   dutasteride (AVODART) 0.5 MG capsule   tamsulosin (FLOMAX) 0.4 MG CAPS capsule     Other   Depression    [facility] for F/U      Relevant Medications   venlafaxine XR (EFFEXOR-XR) 150 MG 24 hr capsule   Other Relevant Orders   Ambulatory referral to Psychiatry   Hyperlipidemia   Relevant Medications   amLODipine (NORVASC) 10 MG tablet   losartan (COZAAR) 100 MG tablet   hydrochlorothiazide (HYDRODIURIL) 25 MG tablet   niacin (NIASPAN) 1000 MG CR tablet   metoprolol succinate (TOPROL-XL) 25 MG 24 hr tablet   Elevated PSA   Relevant Medications   tamsulosin (FLOMAX) 0.4 MG CAPS capsule   Polycythemia, secondary    Followed By hematology      Advanced care planning/counseling discussion    A voluntary discussion about advance care planning including the explanation and discussion of advance directives was extensively discussed  with the patient.  Explanation about the health care proxy and Living will was reviewed and packet with forms with explanation of how to fill them out was given.  Time spent:16+ min        Individuals present pt        Other Visit Diagnoses    PE (physical exam), annual    -  Primary       Follow up plan: Return in about 4 weeks (around 11/13/2017) for BMP.

## 2017-10-16 NOTE — Assessment & Plan Note (Signed)
Mult AK and will derm referal

## 2017-10-16 NOTE — Assessment & Plan Note (Signed)
A voluntary discussion about advance care planning including the explanation and discussion of advance directives was extensively discussed  with the patient.  Explanation about the health care proxy and Living will was reviewed and packet with forms with explanation of how to fill them out was given.  Time spent:16+ min        Individuals present pt

## 2017-10-16 NOTE — Assessment & Plan Note (Signed)
Followed By hematology

## 2017-10-16 NOTE — Addendum Note (Signed)
Addended by: Crissie Figures R on: 10/16/2017 01:29 PM   Modules accepted: Orders

## 2017-10-16 NOTE — Assessment & Plan Note (Signed)
[facility] for F/U

## 2017-10-19 LAB — LIPID PANEL
CHOLESTEROL TOTAL: 100 mg/dL (ref 100–199)
Chol/HDL Ratio: 3 ratio (ref 0.0–5.0)
HDL: 33 mg/dL — AB (ref >39)
LDL Calculated: 41 mg/dL (ref 0–99)
TRIGLYCERIDES: 130 mg/dL (ref 0–149)
VLDL CHOLESTEROL CAL: 26 mg/dL (ref 5–40)

## 2017-10-19 LAB — SPECIMEN STATUS REPORT

## 2017-10-22 ENCOUNTER — Other Ambulatory Visit: Payer: Self-pay | Admitting: Family Medicine

## 2017-10-22 NOTE — Telephone Encounter (Signed)
Testosterone refill request  Controlled substance Last OV 10/16/17 Next OV 11/07/17

## 2017-11-04 ENCOUNTER — Encounter: Payer: Self-pay | Admitting: Psychiatry

## 2017-11-04 ENCOUNTER — Ambulatory Visit: Payer: Medicare Other | Admitting: Psychiatry

## 2017-11-04 ENCOUNTER — Other Ambulatory Visit: Payer: Self-pay

## 2017-11-04 VITALS — BP 167/96 | HR 101 | Temp 98.6°F | Wt 234.0 lb

## 2017-11-04 DIAGNOSIS — F411 Generalized anxiety disorder: Secondary | ICD-10-CM | POA: Diagnosis not present

## 2017-11-04 DIAGNOSIS — F330 Major depressive disorder, recurrent, mild: Secondary | ICD-10-CM

## 2017-11-04 MED ORDER — HYDROXYZINE PAMOATE 25 MG PO CAPS: 25.0000 mg | ORAL_CAPSULE | Freq: Two times a day (BID) | ORAL | 1 refills | Status: DC | PRN

## 2017-11-04 MED ORDER — ESCITALOPRAM OXALATE 5 MG PO TABS: 5.0000 mg | ORAL_TABLET | Freq: Every day | ORAL | 1 refills | Status: DC

## 2017-11-04 NOTE — Progress Notes (Signed)
Psychiatric Initial Adult Assessment   Patient Identification: Kenneth Pruitt MRN:  [id number] Date of Evaluation:  11/04/2017 Referral Source:Dr.Crissman MD Chief Complaint:  ' I am anxious and depressed.'  Chief Complaint    Establish Care; Anxiety; Depression; Stress     Visit Diagnosis:    ICD-10-CM   1. GAD (generalized anxiety disorder) F41.1 escitalopram (LEXAPRO) 5 MG tablet    hydrOXYzine (VISTARIL) 25 MG capsule  2. MDD (major depressive disorder), recurrent episode, mild (HCC) F33.0 escitalopram (LEXAPRO) 5 MG tablet    History of Present Illness:  Kenneth Pruitt is a 67 year old Caucasian male, retired,married , lives in [place], has a history of depression and anxiety, presented to the clinic today to establish care.  Kenneth Pruitt reports that he has been struggling with anxiety symptoms.  He reports some shortness of breath , worrying all the time, feeling nervous on a regular basis.  He reports several psychosocial stressors at this time.  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  He also reports he went through the struggles of his father who struggled with prostate cancer.  His father is now deceased.  He currently struggles with taking care of his mom who has her own health issues and is very difficult to get along with.  He reports his mom recently had a fall in October 2018 which led to a fracture of her femur and also led to PE and other complications. He reports she is in a nursing home at this time but he helps take care of  her 6 days a week.  He also has help from his sister who works full-time as a Nature conservation officer but she visits religiously once every week to assist in taking care of  his mom.  He also reports depressive symptoms like sadness, lethargy, lack of motivation, decreased appetite as well  as some memory issues on and off .  He reports sleep is fair but he does wake up several times in the middle of the night to use the restroom.  He reports he struggles with BPH and needs to use the restroom at night.  He reports he has trouble going back to sleep once he wakes up.  He reports he used to sleep in the middle of the day for a long time, mostly because of being on a medication called clonidine.  He reports that once the clonidine was discontinued 2 weeks ago that has gotten better.  He also reports a history of some passive suicidal ideation.  He reports he would never act on suicidal thoughts and never had any active plans.  He reports the last time he had a suicidal thought was a long time ago.  He denies any history of sexual, physical or emotional trauma.  He does report multiple medical problems.  He has high blood pressure, BPH, coronary artery disease, status post stent placement.  He reports he is on multiple medications for high blood pressure at this time.  His family physician-Dr. Jeananne Rama is managing the same at this time.     Associated Signs/Symptoms: Depression Symptoms:  depressed mood, fatigue, difficulty concentrating, anxiety, decreased appetite, (Hypo) Manic Symptoms:  denies Anxiety Symptoms:  Excessive Worry, Psychotic Symptoms:  denies PTSD Symptoms: Negative  Past Psychiatric History: Reports a history of depression and  anxiety in the past.  He reports he has tried antidepressants several years ago.  He does not remember the name of those medications.  He reports he remembers he has tried Zoloft in the past.  He did not tolerate it well or it was not effective.  He denies any suicide attempts.  He reports he has never been to a psychiatrist or a therapist in the past.  His primary medical doctor Dr. Jeananne Rama has been giving him Effexor.  He reports he was started on Effexor in 2003 and he was on 300 mg/day for a long time.  He tried to taper it off however  that was not successful.  He reports he is currently on 150 mg daily.  Previous Psychotropic Medications: Yes , effexor, zoloft  Substance Abuse History in the last 12 months:  No.  Consequences of Substance Abuse: Negative  Past Medical History:  Past Medical History:  Diagnosis Date  . Coronary artery disease   . Depression   . Hyperlipidemia     Past Surgical History:  Procedure Laterality Date  . CARDIAC CATHETERIZATION N/A 04/26/2015   Procedure: Left Heart Cath;  Surgeon: Dionisio David, MD;  Location: [facility];  Service: Cardiovascular;  Laterality: N/A;  . CORONARY ANGIOPLASTY    . EYE SURGERY Bilateral    cataract    Family Psychiatric History: Father-depression, both daughters-mental illness likely depression or anxiety.  Family History:  Family History  Problem Relation Age of Onset  . Diabetes Mother   . Hypertension Mother   . Cancer Father   . Depression Father   . Anxiety disorder Father   . Depression Daughter     Social History:   Social History   Socioeconomic History  . Marital status: Married    Spouse name: ann  . Number of children: 2  . Years of education: None  . Highest education level: Master's degree (e.g., MA, MS, MEng, MEd, MSW, MBA)  Social Needs  . Financial resource strain: Not hard at all  . Food insecurity - worry: Never true  . Food insecurity - inability: Never true  . Transportation needs - medical: No  . Transportation needs - non-medical: No  Occupational History    Comment: retired  Tobacco Use  . Smoking status: Former Smoker    Years: 30.00    Types: Cigars    Last attempt to quit: 07/18/2006    Years since quitting: 11.3  . Smokeless tobacco: Never Used  Substance and Sexual Activity  . Alcohol use: No  . Drug use: No  . Sexual activity: Not Currently  Other Topics Concern  . None  Social History Narrative  . None    Additional Social History: Dominique is married.  He lives in [place].  He  used to be a Art therapist, currently retired.  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  His highest level  of education is post graduate degree, major in music.  He enjoys cycling and woodworking.  He has 2 daughters who are adults.  He has 1 sister with whom he has a good relationship with.  His father is deceased.  His mother is currently in the nursing home and he helps take care of her.  Allergies:   Allergies  Allergen Reactions  . Clonidine Derivatives Shortness Of Breath    Low heart rate    Metabolic Disorder Labs: No results found for: HGBA1C, MPG No results found for: PROLACTIN Lab Results  Component Value Date   CHOL 100 10/11/2017   TRIG 130 10/11/2017   HDL 33 (L) 10/11/2017   CHOLHDL 3.0 10/11/2017   VLDL 38 (H) 08/06/2016   LDLCALC 41 10/11/2017   LDLCALC 33 02/05/2017     Current Medications: Current Outpatient Medications  Medication Sig Dispense Refill  . amLODipine (NORVASC) 10 MG tablet Take 1 tablet (10 mg total) by mouth daily. 90 tablet 4  . Cholecalciferol ([employer] VIT D 5000 HIGH-POTENCY) 5000 units capsule Take 5,000 Units by mouth daily.    . clopidogrel (PLAVIX) 75 MG tablet Take 75 mg by mouth daily.    Marland Kitchen dutasteride (AVODART) 0.5 MG capsule Take 1 capsule (0.5 mg total) by mouth daily. 90 capsule 4  . fluticasone (FLONASE) 50 MCG/ACT nasal spray USE 2 SPRAYS IN EACH NOSTRIL DAILY 16 g 6  . hydrALAZINE (APRESOLINE) 25 MG tablet     . hydrochlorothiazide (HYDRODIURIL) 25 MG tablet Take 1 tablet (25 mg total) by mouth daily. 90 tablet 4  . losartan (COZAAR) 100 MG tablet Take 1 tablet (100 mg total) by mouth daily. 90 tablet 4  . meloxicam (MOBIC) 15 MG tablet Take 1 tablet (15 mg total) by mouth daily. 30 tablet 6  . metoprolol succinate (TOPROL-XL) 25 MG 24 hr tablet Take 1 tablet (25 mg total) by mouth daily. 30 tablet 2  . mupirocin ointment (BACTROBAN) 2 % APPLY AS DIRECTED 22 g 1  . niacin (NIASPAN) 1000 MG CR tablet Take 2 tablets (2,000 mg  total) by mouth at bedtime. 180 tablet 4  . Omega-3 Fatty Acids (FISH OIL) 1000 MG CAPS Take 1,000 capsules by mouth.    . pantoprazole (PROTONIX) 40 MG tablet 40 mg daily.    . rosuvastatin (CRESTOR) 10 MG tablet Take 10 mg by mouth at bedtime.    . tamsulosin (FLOMAX) 0.4 MG CAPS capsule Take 1 capsule (0.4 mg total) by mouth daily. 90 capsule 4  . testosterone cypionate (DEPOTESTOSTERONE CYPIONATE) 200 MG/ML injection INJECT 0.75MLS INTRAMUSCULARLY EVERY 10 DAYS 10 mL 1  . Turmeric 450 MG CAPS Take 1 capsule by mouth daily.    Marland Kitchen venlafaxine XR (EFFEXOR-XR) 150 MG 24 hr capsule Take 1 capsule (150 mg total) by mouth daily with breakfast. Reported on 11/17/2015 30 capsule 12  . vitamin B-12 (CYANOCOBALAMIN) 1000 MCG tablet Take 1,000 mcg by mouth daily.    Marland Kitchen escitalopram (LEXAPRO) 5 MG tablet Take 1 tablet (5 mg total) by mouth daily. 30 tablet 1  . hydrOXYzine (VISTARIL) 25 MG capsule Take 1 capsule (25 mg total) by mouth 2 (two) times daily as needed for anxiety (only for severe anxiety sx and also for sleep). 60 capsule 1   No current facility-administered medications for this visit.     Neurologic: Headache: No Seizure: No Paresthesias:No  Musculoskeletal: Strength & Muscle Tone: within normal limits Gait & Station: normal Patient leans: N/A  Psychiatric Specialty Exam: Review of Systems  Psychiatric/Behavioral: Positive for depression. The patient is nervous/anxious.   All other systems reviewed and are negative.   Blood pressure (!) 167/96, pulse (!) 101, temperature 98.6 F (37 C), temperature source Oral, weight 234 lb (106.1 kg).Body mass index is 30.87 kg/m.  General Appearance: Casual  Eye Contact:  Fair  Speech:  Clear and Coherent  Volume:  Normal  Mood:  Anxious and Depressed  Affect:  Congruent  Thought Process:  Goal Directed and Descriptions of Associations: Intact  Orientation:  Full (Time, Place, and Person)  Thought Content:  Logical  Suicidal Thoughts:   No  Homicidal Thoughts:  No  Memory:  Immediate;   Fair Recent;   Fair Remote;   Fair  Judgement:  Fair  Insight:  Fair  Psychomotor Activity:  Normal  Concentration:  Concentration: Fair and Attention Span: Fair  Recall:  [employer] of Knowledge:Fair  Language: Fair  Akathisia:  No  Handed:  Right  AIMS (if indicated):  NA  Assets:  Communication Skills Desire for Improvement Financial Resources/Insurance Housing Intimacy Social Support  ADL's:  Intact  Cognition: WNL  Sleep:  restless    Treatment Plan Summary: Tysean is a 67 year old Caucasian male who has a history of depression, anxiety, as well as multiple medical problems including essential hypertension, coronary artery disease, status post stent placement, hyperlipidemia, BPH , presented to the clinic today to establish care.  Zarion is currently on Effexor 150 mg, however his anxiety and depressive symptoms continues to be not stable.  He does have several psychosocial stressors as mentioned above.  He is on multiple antihypertensive medications at this time, unknown if this is also contributed by the Effexor which can cause elevated blood pressure in some patients.  He has good social support.  He denies any substance abuse problems.  He denies any active suicidal thoughts at this time.  Plan as noted below. Medication management and Plan see below Plan  MDD Continue Effexor XR 150 mg daily with breakfast. Start Lexapro 5 mg, discussed with him to take it with supper, however if it disrupts his sleep he can take it with lunch. CBT, we will refer him to [name] for psychotherapy  For generalized anxiety disorder Continue Effexor XR 150 mg p.o. daily with breakfast Start Lexapro 5 mg as discussed about. Start hydroxyzine 25 mg p.o. twice daily as needed as needed for severe anxiety symptoms Refer for CBT.  For insomnia He does have interrupted sleep mostly due to his BPH and the need to wake up in the middle of  the night to urinate.  Discussed with him about using Vistaril as needed for sleep at this time. Also discussed sleep hygiene.  Provided handouts.  Reviewed labs in EHR. TSH - 02/05/2017 - 1.030 - WNL.  Follow up in clinic in 4 weeks or sooner if needed.  He will call me if he has any concerns with his medications.  More than 50 % of the time was spent for psychoeducation and supportive psychotherapy and care coordination.  This note was generated in part or whole with voice recognition software. Voice recognition is usually quite accurate but there are transcription errors that can and very often do occur. I apologize for any typographical errors that were not detected and corrected.          Ursula Alert, MD 2/18/201912:18 PM

## 2017-11-04 NOTE — Patient Instructions (Addendum)
Escitalopram tablets What is this medicine? ESCITALOPRAM (es sye TAL oh pram) is used to treat depression and certain types of anxiety. This medicine may be used for other purposes; ask your health care provider or pharmacist if you have questions. COMMON BRAND NAME(S): Lexapro What should I tell my health care provider before I take this medicine? They need to know if you have any of these conditions: -bipolar disorder or a family history of bipolar disorder -diabetes -glaucoma -heart disease -kidney or liver disease -receiving electroconvulsive therapy -seizures (convulsions) -suicidal thoughts, plans, or attempt by you or a family member -an unusual or allergic reaction to escitalopram, the related drug citalopram, other medicines, foods, dyes, or preservatives -pregnant or trying to become pregnant -breast-feeding How should I use this medicine? Take this medicine by mouth with a glass of water. Follow the directions on the prescription label. You can take it with or without food. If it upsets your stomach, take it with food. Take your medicine at regular intervals. Do not take it more often than directed. Do not stop taking this medicine suddenly except upon the advice of your doctor. Stopping this medicine too quickly may cause serious side effects or your condition may worsen. A special MedGuide will be given to you by the pharmacist with each prescription and refill. Be sure to read this information carefully each time. Talk to your pediatrician regarding the use of this medicine in children. Special care may be needed. Overdosage: If you think you have taken too much of this medicine contact a poison control center or emergency room at once. NOTE: This medicine is only for you. Do not share this medicine with others. What if I miss a dose? If you miss a dose, take it as soon as you can. If it is almost time for your next dose, take only that dose. Do not take double or extra  doses. What may interact with this medicine? Do not take this medicine with any of the following medications: -certain medicines for fungal infections like fluconazole, itraconazole, ketoconazole, posaconazole, voriconazole -cisapride -citalopram -dofetilide -dronedarone -linezolid -MAOIs like Carbex, Eldepryl, Marplan, Nardil, and Parnate -methylene blue (injected into a vein) -pimozide -thioridazine -ziprasidone This medicine may also interact with the following medications: -alcohol -amphetamines -aspirin and aspirin-like medicines -carbamazepine -certain medicines for depression, anxiety, or psychotic disturbances -certain medicines for migraine headache like almotriptan, eletriptan, frovatriptan, naratriptan, rizatriptan, sumatriptan, zolmitriptan -certain medicines for sleep -certain medicines that treat or prevent blood clots like warfarin, enoxaparin, dalteparin -cimetidine -diuretics -fentanyl -furazolidone -isoniazid -lithium -metoprolol -NSAIDs, medicines for pain and inflammation, like ibuprofen or naproxen -other medicines that prolong the QT interval (cause an abnormal heart rhythm) -procarbazine -rasagiline -supplements like St. John's wort, kava kava, valerian -tramadol -tryptophan This list may not describe all possible interactions. Give your health care provider a list of all the medicines, herbs, non-prescription drugs, or dietary supplements you use. Also tell them if you smoke, drink alcohol, or use illegal drugs. Some items may interact with your medicine. What should I watch for while using this medicine? Tell your doctor if your symptoms do not get better or if they get worse. Visit your doctor or health care professional for regular checks on your progress. Because it may take several weeks to see the full effects of this medicine, it is important to continue your treatment as prescribed by your doctor. Patients and their families should watch out for  new or worsening thoughts of suicide or depression. Also watch out for   sudden changes in feelings such as feeling anxious, agitated, panicky, irritable, hostile, aggressive, impulsive, severely restless, overly excited and hyperactive, or not being able to sleep. If this happens, especially at the beginning of treatment or after a change in dose, call your health care professional. Dennis Bast may get drowsy or dizzy. Do not drive, use machinery, or do anything that needs mental alertness until you know how this medicine affects you. Do not stand or sit up quickly, especially if you are an older patient. This reduces the risk of dizzy or fainting spells. Alcohol may interfere with the effect of this medicine. Avoid alcoholic drinks. Your mouth may get dry. Chewing sugarless gum or sucking hard candy, and drinking plenty of water may help. Contact your doctor if the problem does not go away or is severe. What side effects may I notice from receiving this medicine? Side effects that you should report to your doctor or health care professional as soon as possible: -allergic reactions like skin rash, itching or hives, swelling of the face, lips, or tongue -anxious -black, tarry stools -changes in vision -confusion -elevated mood, decreased need for sleep, racing thoughts, impulsive behavior -eye pain -fast, irregular heartbeat -feeling faint or lightheaded, falls -feeling agitated, angry, or irritable -hallucination, loss of contact with reality -loss of balance or coordination -loss of memory -painful or prolonged erections -restlessness, pacing, inability to keep still -seizures -stiff muscles -suicidal thoughts or other mood changes -trouble sleeping -unusual bleeding or bruising -unusually weak or tired -vomiting Side effects that usually do not require medical attention (report to your doctor or health care professional if they continue or are bothersome): -changes in appetite -change in sex  drive or performance -headache -increased sweating -indigestion, nausea -tremors This list may not describe all possible side effects. Call your doctor for medical advice about side effects. You may report side effects to FDA at 1-800-FDA-1088. Where should I keep my medicine? Keep out of reach of children. Store at room temperature between 15 and 30 degrees C (59 and 86 degrees F). Throw away any unused medicine after the expiration date. NOTE: This sheet is a summary. It may not cover all possible information. If you have questions about this medicine, talk to your doctor, pharmacist, or health care provider.  2018 Elsevier/Gold Standard (2016-02-06 13:20:23) Hydroxyzine capsules or tablets What is this medicine? HYDROXYZINE (hye [place] i zeen) is an antihistamine. This medicine is used to treat allergy symptoms. It is also used to treat anxiety and tension. This medicine can be used with other medicines to induce sleep before surgery. This medicine may be used for other purposes; ask your health care provider or pharmacist if you have questions. COMMON BRAND NAME(S): ANX, Atarax, Rezine, Vistaril What should I tell my health care provider before I take this medicine? They need to know if you have any of these conditions: -any chronic illness -difficulty passing urine -glaucoma -heart disease -kidney disease -liver disease -lung disease -an unusual or allergic reaction to hydroxyzine, cetirizine, other medicines, foods, dyes, or preservatives -pregnant or trying to get pregnant -breast-feeding How should I use this medicine? Take this medicine by mouth with a full glass of water. Follow the directions on the prescription label. You may take this medicine with food or on an empty stomach. Take your medicine at regular intervals. Do not take your medicine more often than directed. Talk to your pediatrician regarding the use of this medicine in children. Special care may be needed. While  this drug may  be prescribed for children as young as 54 years of age for selected conditions, precautions do apply. Patients over 4 years old may have a stronger reaction and need a smaller dose. Overdosage: If you think you have taken too much of this medicine contact a poison control center or emergency room at once. NOTE: This medicine is only for you. Do not share this medicine with others. What if I miss a dose? If you miss a dose, take it as soon as you can. If it is almost time for your next dose, take only that dose. Do not take double or extra doses. What may interact with this medicine? -alcohol -barbiturate medicines for sleep or seizures -medicines for colds, allergies -medicines for depression, anxiety, or emotional disturbances -medicines for pain -medicines for sleep -muscle relaxants This list may not describe all possible interactions. Give your health care provider a list of all the medicines, herbs, non-prescription drugs, or dietary supplements you use. Also tell them if you smoke, drink alcohol, or use illegal drugs. Some items may interact with your medicine. What should I watch for while using this medicine? Tell your doctor or health care professional if your symptoms do not improve. You may get drowsy or dizzy. Do not drive, use machinery, or do anything that needs mental alertness until you know how this medicine affects you. Do not stand or sit up quickly, especially if you are an older patient. This reduces the risk of dizzy or fainting spells. Alcohol may interfere with the effect of this medicine. Avoid alcoholic drinks. Your mouth may get dry. Chewing sugarless gum or sucking hard candy, and drinking plenty of water may help. Contact your doctor if the problem does not go away or is severe. This medicine may cause dry eyes and blurred vision. If you wear contact lenses you may feel some discomfort. Lubricating drops may help. See your eye doctor if the problem does  not go away or is severe. If you are receiving skin tests for allergies, tell your doctor you are using this medicine. What side effects may I notice from receiving this medicine? Side effects that you should report to your doctor or health care professional as soon as possible: -fast or irregular heartbeat -difficulty passing urine -seizures -slurred speech or confusion -tremor Side effects that usually do not require medical attention (report to your doctor or health care professional if they continue or are bothersome): -constipation -drowsiness -fatigue -headache -stomach upset This list may not describe all possible side effects. Call your doctor for medical advice about side effects. You may report side effects to FDA at 1-800-FDA-1088. Where should I keep my medicine? Keep out of the reach of children. Store at room temperature between 15 and 30 degrees C (59 and 86 degrees F). Keep container tightly closed. Throw away any unused medicine after the expiration date. NOTE: This sheet is a summary. It may not cover all possible information. If you have questions about this medicine, talk to your doctor, pharmacist, or health care provider.  2018 Elsevier/Gold Standard (2008-01-16 14:50:59)

## 2017-11-07 ENCOUNTER — Ambulatory Visit: Payer: Medicare Other | Admitting: Family Medicine

## 2017-11-20 ENCOUNTER — Other Ambulatory Visit: Payer: Self-pay | Admitting: Family Medicine

## 2017-11-20 NOTE — Telephone Encounter (Signed)
LOV 10/16/17 with Dr. Jeananne Rama / Refill request for [facility] /

## 2017-12-02 ENCOUNTER — Encounter: Payer: Self-pay | Admitting: Psychiatry

## 2017-12-02 ENCOUNTER — Ambulatory Visit: Payer: Medicare Other | Admitting: Psychiatry

## 2017-12-02 ENCOUNTER — Other Ambulatory Visit: Payer: Self-pay

## 2017-12-02 VITALS — BP 158/85 | HR 76 | Temp 98.5°F | Wt 237.4 lb

## 2017-12-02 DIAGNOSIS — F411 Generalized anxiety disorder: Secondary | ICD-10-CM | POA: Diagnosis not present

## 2017-12-02 DIAGNOSIS — F330 Major depressive disorder, recurrent, mild: Secondary | ICD-10-CM

## 2017-12-02 MED ORDER — ESCITALOPRAM OXALATE 10 MG PO TABS: 10.0000 mg | ORAL_TABLET | Freq: Every day | ORAL | 1 refills | Status: DC

## 2017-12-02 MED ORDER — VENLAFAXINE HCL ER 37.5 MG PO CP24: 37.5000 mg | ORAL_CAPSULE | Freq: Every day | ORAL | 1 refills | Status: DC

## 2017-12-02 NOTE — Progress Notes (Signed)
[facility] Progress Note  12/02/2017 2:39 PM CARMINE CARROZZA  MRN:  [id number]  Chief Complaint: ' I am still the same." Chief Complaint    Follow-up; Medication Refill     HPI: Kenneth Pruitt is a 67 year old Caucasian male, retired, married, lives in [place], has a history of depression and anxiety, presented to the clinic today for a follow-up visit.  Jaston reports he started taking the Lexapro 5 mg along with the Effexor XR which he has been on in the past.  He reports he has not noticed any improvement in his mood.  He however denies any side effects to the Lexapro at this time.  He is tolerating it well.  He continues to have psychosocial stressors of having to deal with his mother who is in a nursing home.  He helps take care of her 6 days a week.  He does have some support from his sister who comes in and assist on the weekend.  He reports he feels nervous on a regular basis and worries a lot.  He does have some anxiety attacks however he did not need the hydroxyzine much.  He reports he may have taken it once or twice since his last visit to the clinic.  He reports he sleeps good at night.  He however takes 2 hours of nap in the afternoon.  He reports he struggles with motivation on a regular basis.  He would like to do more activities.  He reports he is currently working on getting out and doing some biking with his family since the weather is warming up.  He denies any other concerns at this time.  Discussed psychotherapy with patient.  He reports he was waiting to get a call back from the therapist since our last visit.  Discussed with him to make an appointment with Royal Piedra here in clinic.  He agrees with plan.  Visit Diagnosis:    ICD-10-CM   1. GAD (generalized anxiety disorder) F41.1 venlafaxine XR (EFFEXOR-XR) 37.5 MG 24 hr capsule    escitalopram (LEXAPRO) 10 MG tablet  2. MDD (major depressive disorder), recurrent episode, mild (HCC) F33.0 venlafaxine XR (EFFEXOR-XR) 37.5  MG 24 hr capsule    escitalopram (LEXAPRO) 10 MG tablet    Past Psychiatric History: Reports a history of depression and anxiety in the past.  He reports he has tried antidepressants in the past.  He does not remember the name of those medication because it was several years ago.  He reports he remembers he has tried Zoloft and Effexor (by his family physician).  He reports he has been on Effexor since 2003.  He did try to taper it off however was not successful in the past.  Past Medical History:  Past Medical History:  Diagnosis Date  . Coronary artery disease   . Depression   . Hyperlipidemia     Past Surgical History:  Procedure Laterality Date  . CARDIAC CATHETERIZATION N/A 04/26/2015   Procedure: Left Heart Cath;  Surgeon: Dionisio David, MD;  Location: [facility];  Service: Cardiovascular;  Laterality: N/A;  . CORONARY ANGIOPLASTY    . EYE SURGERY Bilateral    cataract    Family Psychiatric History: Father-depression, both daughters-mental illness likely depression or anxiety.  Family History:  Family History  Problem Relation Age of Onset  . Diabetes Mother   . Hypertension Mother   . Cancer Father   . Depression Father   . Anxiety disorder Father   .  Depression Daughter    Substance abuse history: Denies  Social History: Elbie is married.  He lives in [place].  He used to be a Art therapist, currently retired.  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  His highest level of education is post graduate degree, major in music.  He enjoys cycling and woodworking. He has 2 daughters who are adults.  He has one sister with whom he has a good relationship with.  His father is deceased.  His mother is currently in the nursing home and he helps take care of her. Social History   Socioeconomic History  . Marital status: Married    Spouse name: ann  . Number of children: 2  . Years of education: None  . Highest education level: Master's degree (e.g., MA, MS,  MEng, MEd, MSW, MBA)  Social Needs  . Financial resource strain: Not hard at all  . Food insecurity - worry: Never true  . Food insecurity - inability: Never true  . Transportation needs - medical: No  . Transportation needs - non-medical: No  Occupational History    Comment: retired  Tobacco Use  . Smoking status: Former Smoker    Years: 30.00    Types: Cigars    Last attempt to quit: 07/18/2006    Years since quitting: 11.3  . Smokeless tobacco: Never Used  Substance and Sexual Activity  . Alcohol use: No  . Drug use: No  . Sexual activity: Not Currently  Other Topics Concern  . None  Social History Narrative  . None    Allergies:  Allergies  Allergen Reactions  . Clonidine Derivatives Shortness Of Breath    Low heart rate    Metabolic Disorder Labs: No results found for: HGBA1C, MPG No results found for: PROLACTIN Lab Results  Component Value Date   CHOL 100 10/11/2017   TRIG 130 10/11/2017   HDL 33 (L) 10/11/2017   CHOLHDL 3.0 10/11/2017   VLDL 38 (H) 08/06/2016   LDLCALC 41 10/11/2017   LDLCALC 33 02/05/2017   Lab Results  Component Value Date   TSH 1.030 02/05/2017   TSH 1.270 02/02/2016    Therapeutic Level Labs: No results found for: LITHIUM No results found for: VALPROATE No components found for:  CBMZ  Current Medications: Current Outpatient Medications  Medication Sig Dispense Refill  . amLODipine (NORVASC) 10 MG tablet Take 1 tablet (10 mg total) by mouth daily. 90 tablet 4  . Cholecalciferol ([employer] VIT D 5000 HIGH-POTENCY) 5000 units capsule Take 5,000 Units by mouth daily.    . clopidogrel (PLAVIX) 75 MG tablet Take 75 mg by mouth daily.    Marland Kitchen dutasteride (AVODART) 0.5 MG capsule Take 1 capsule (0.5 mg total) by mouth daily. 90 capsule 4  . fluticasone (FLONASE) 50 MCG/ACT nasal spray USE 2 SPRAYS IN EACH NOSTRIL DAILY 16 g 6  . hydrochlorothiazide (HYDRODIURIL) 25 MG tablet Take 1 tablet (25 mg total) by mouth daily. 90 tablet 4  .  hydrOXYzine (VISTARIL) 25 MG capsule Take 1 capsule (25 mg total) by mouth 2 (two) times daily as needed for anxiety (only for severe anxiety sx and also for sleep). 60 capsule 1  . losartan (COZAAR) 100 MG tablet Take 1 tablet (100 mg total) by mouth daily. 90 tablet 4  . meloxicam (MOBIC) 15 MG tablet Take 1 tablet (15 mg total) by mouth daily. 30 tablet 6  . metoprolol succinate (TOPROL-XL) 25 MG 24 hr tablet Take 1 tablet (25  mg total) by mouth daily. 30 tablet 2  . mupirocin ointment (BACTROBAN) 2 % APPLY AS DIRECTED 22 g 1  . niacin (NIASPAN) 1000 MG CR tablet Take 2 tablets (2,000 mg total) by mouth at bedtime. 180 tablet 4  . Omega-3 Fatty Acids (FISH OIL) 1000 MG CAPS Take 1,000 capsules by mouth.    . pantoprazole (PROTONIX) 40 MG tablet 40 mg daily.    Marland Kitchen PROCTO-MED HC 2.5 % rectal cream APPLY RECTALLY TWICE DAILY AS DIRECTED 28.35 g 1  . rosuvastatin (CRESTOR) 10 MG tablet Take 10 mg by mouth at bedtime.    . tamsulosin (FLOMAX) 0.4 MG CAPS capsule Take 1 capsule (0.4 mg total) by mouth daily. 90 capsule 4  . testosterone cypionate (DEPOTESTOSTERONE CYPIONATE) 200 MG/ML injection INJECT 0.75MLS INTRAMUSCULARLY EVERY 10 DAYS 10 mL 1  . Turmeric 450 MG CAPS Take 1 capsule by mouth daily.    . vitamin B-12 (CYANOCOBALAMIN) 1000 MCG tablet Take 1,000 mcg by mouth daily.    Marland Kitchen escitalopram (LEXAPRO) 10 MG tablet Take 1 tablet (10 mg total) by mouth at bedtime. 30 tablet 1  . hydrALAZINE (APRESOLINE) 50 MG tablet     . metoprolol succinate (TOPROL-XL) 50 MG 24 hr tablet     . venlafaxine XR (EFFEXOR-XR) 37.5 MG 24 hr capsule Take 1 capsule (37.5 mg total) by mouth daily with breakfast. 30 capsule 1   No current facility-administered medications for this visit.      Musculoskeletal: Strength & Muscle Tone: within normal limits Gait & Station: normal Patient leans: N/A  Psychiatric Specialty Exam: Review of Systems  Psychiatric/Behavioral: Positive for depression. The patient is  nervous/anxious.   All other systems reviewed and are negative.   Blood pressure (!) 158/85, pulse 76, temperature 98.5 F (36.9 C), temperature source Oral, weight 237 lb 6.4 oz (107.7 kg).Body mass index is 31.32 kg/m.  General Appearance: Casual  Eye Contact:  Fair  Speech:  Clear and Coherent  Volume:  Normal  Mood:  Anxious and Dysphoric  Affect:  Congruent  Thought Process:  Goal Directed and Descriptions of Associations: Intact  Orientation:  Full (Time, Place, and Person)  Thought Content: Logical   Suicidal Thoughts:  No  Homicidal Thoughts:  No  Memory:  Immediate;   Fair Recent;   Fair Remote;   Fair  Judgement:  Fair  Insight:  Fair  Psychomotor Activity:  Normal  Concentration:  Concentration: Fair and Attention Span: Fair  Recall:  [employer] of Knowledge: Fair  Language: Fair  Akathisia:  No  Handed:  Right  AIMS (if indicated): NA  Assets:  Communication Skills Desire for Improvement Housing Intimacy Leisure Time Social Support Talents/Skills Transportation  ADL's:  Intact  Cognition: WNL  Sleep:  Sleeps excessive - good at nights , takes naps for atleast 2 hrs during the day   Screenings: PHQ2-9     Clinical Support from 10/11/2017 in [facility] Visit from 03/12/2017 in [facility] Visit from 02/02/2016 in [facility]  PHQ-2 Total Score  6  1  1   PHQ-9 Total Score  24  No data  No data       Assessment and Plan: Jaramiah is a 67 year old Caucasian male who has a history of depression, anxiety as well as multiple medical problems including essential hypertension, coronary artery disease, status post stent placement, hyperlipidemia, BPH, presented to the clinic today for a follow-up visit.  Ferdinando is currently tolerating the Lexapro which  was started during his last visit.  He continues to want to be tapered off of the Effexor since he does not think it is effective at this point and also has high blood  pressure and is currently on multiple medications for the same.  He did try to taper himself off the Effexor in the past but that did not go well.  Discussed medication readjustment with patient plan as noted below.  Plan MDD Reduce Effexor XR to 37.5 mg p.o. daily with breakfast Increase Lexapro to 10 mg q. at bedtime. Refer for CBT with [name]-he will see her as soon as possible.  GAD Increase Lexapro to 10 mg p.o. nightly Continue hydroxyzine 25 mg p.o. twice daily as needed for severe anxiety symptoms Reduce Effexor XR to 37.5 mg with plan to wean himself off of it if he has good benefit from the Lexapro. Referred for CBT.  For insomnia He reports he is sleeping well at night.  He however reports sleeping excessively during daytime , he takes 2-hour naps daily. Discussed with him about being active, getting out and doing activities like biking, walking and so on.    Follow up in clinic in 3 weeks or sooner if needed.  More than 50 % of the time was spent for psychoeducation and supportive psychotherapy and care coordination.  This note was generated in part or whole with voice recognition software. Voice recognition is usually quite accurate but there are transcription errors that can and very often do occur. I apologize for any typographical errors that were not detected and corrected.      Ursula Alert, MD 12/02/2017, 2:39 PM

## 2017-12-02 NOTE — Patient Instructions (Signed)
Start taking Effexor XR 37.5 mg daily .  Start taking Lexapro 10 mg daily.  Please call me if you have concerns.

## 2017-12-08 NOTE — Progress Notes (Signed)
[name]  [phone] [phone]  ID: Kenneth Pruitt OB: 1951/04/09  MR#: [id number]  AOZ#:[id number]  Patient Care Team: Guadalupe Maple, MD as PCP - General (Family Medicine) Guadalupe Maple, MD as PCP - Family Medicine (Family Medicine) Brendolyn Patty, MD (Dermatology) Earnestine Leys, MD (Specialist) Dionisio David, MD as Consulting Physician (Cardiology) Troxler, Adele Schilder as Attending Physician (Podiatry)  CHIEF COMPLAINT: Secondary polycythemia.  INTERVAL HISTORY: Patient returns to clinic today for repeat laboratory work, further evaluation, and consideration of additional phlebotomy. He continues to feel well and remains asymptomatic. He does not complain of weakness or fatigue. He has no neurologic complaints. Patient denies any recent fevers or illnesses. He has a good appetite and denies weight loss. He has no chest pain or shortness of breath. He denies any nausea, vomiting, constipation, or diarrhea. He has no urinary complaints. Patient offers no specific complaints today.  REVIEW OF SYSTEMS:   Review of Systems  Constitutional: Negative.  Negative for fever, malaise/fatigue and weight loss.  Respiratory: Negative.  Negative for cough and shortness of breath.   Cardiovascular: Negative.  Negative for chest pain and leg swelling.  Gastrointestinal: Negative.  Negative for abdominal pain, blood in stool and melena.  Genitourinary: Negative.   Musculoskeletal: Negative.   Skin: Negative.  Negative for rash.  Neurological: Negative.  Negative for sensory change, focal weakness and weakness.  Psychiatric/Behavioral: Negative.  The patient is not nervous/anxious.     As per HPI. Otherwise, a complete review of systems is negative.  PAST MEDICAL HISTORY: Past Medical History:  Diagnosis Date  . Coronary artery disease   . Depression   . Hyperlipidemia     PAST SURGICAL HISTORY: Past Surgical History:  Procedure  Laterality Date  . CARDIAC CATHETERIZATION N/A 04/26/2015   Procedure: Left Heart Cath;  Surgeon: Dionisio David, MD;  Location: [facility];  Service: Cardiovascular;  Laterality: N/A;  . CORONARY ANGIOPLASTY    . EYE SURGERY Bilateral    cataract    FAMILY HISTORY: Family History  Problem Relation Age of Onset  . Diabetes Mother   . Hypertension Mother   . Cancer Father   . Depression Father   . Anxiety disorder Father   . Depression Daughter     ADVANCED DIRECTIVES (Y/N):  N  HEALTH MAINTENANCE: Social History   Tobacco Use  . Smoking status: Former Smoker    Years: 30.00    Types: Cigars    Last attempt to quit: 07/18/2006    Years since quitting: 11.4  . Smokeless tobacco: Never Used  Substance Use Topics  . Alcohol use: No  . Drug use: No     Colonoscopy:  PAP:  Bone density:  Lipid panel:  Allergies  Allergen Reactions  . Clonidine Derivatives Shortness Of Breath    Low heart rate    Current Outpatient Medications  Medication Sig Dispense Refill  . amLODipine (NORVASC) 10 MG tablet Take 1 tablet (10 mg total) by mouth daily. 90 tablet 4  . Cholecalciferol ([employer] VIT D 5000 HIGH-POTENCY) 5000 units capsule Take 5,000 Units by mouth daily.    . clopidogrel (PLAVIX) 75 MG tablet Take 75 mg by mouth daily.    Marland Kitchen dutasteride (AVODART) 0.5 MG capsule Take 1 capsule (0.5 mg total) by mouth daily. 90 capsule 4  . escitalopram (LEXAPRO) 10 MG tablet Take 1 tablet (10 mg total) by mouth at bedtime. 30 tablet 1  . fluticasone (FLONASE) 50 MCG/ACT  nasal spray USE 2 SPRAYS IN EACH NOSTRIL DAILY 16 g 6  . hydrALAZINE (APRESOLINE) 50 MG tablet     . hydrochlorothiazide (HYDRODIURIL) 25 MG tablet Take 1 tablet (25 mg total) by mouth daily. 90 tablet 4  . hydrOXYzine (VISTARIL) 25 MG capsule Take 1 capsule (25 mg total) by mouth 2 (two) times daily as needed for anxiety (only for severe anxiety sx and also for sleep). 60 capsule 1  . losartan (COZAAR) 100 MG tablet  Take 1 tablet (100 mg total) by mouth daily. 90 tablet 4  . meloxicam (MOBIC) 15 MG tablet Take 1 tablet (15 mg total) by mouth daily. 30 tablet 6  . metoprolol succinate (TOPROL-XL) 25 MG 24 hr tablet Take 1 tablet (25 mg total) by mouth daily. 30 tablet 2  . metoprolol succinate (TOPROL-XL) 50 MG 24 hr tablet     . mupirocin ointment (BACTROBAN) 2 % APPLY AS DIRECTED 22 g 1  . niacin (NIASPAN) 1000 MG CR tablet Take 2 tablets (2,000 mg total) by mouth at bedtime. 180 tablet 4  . Omega-3 Fatty Acids (FISH OIL) 1000 MG CAPS Take 1,000 capsules by mouth.    . pantoprazole (PROTONIX) 40 MG tablet 40 mg daily.    Marland Kitchen PROCTO-MED HC 2.5 % rectal cream APPLY RECTALLY TWICE DAILY AS DIRECTED 28.35 g 1  . rosuvastatin (CRESTOR) 10 MG tablet Take 10 mg by mouth at bedtime.    . tamsulosin (FLOMAX) 0.4 MG CAPS capsule Take 1 capsule (0.4 mg total) by mouth daily. 90 capsule 4  . testosterone cypionate (DEPOTESTOSTERONE CYPIONATE) 200 MG/ML injection INJECT 0.75MLS INTRAMUSCULARLY EVERY 10 DAYS 10 mL 1  . Turmeric 450 MG CAPS Take 1 capsule by mouth daily.    Marland Kitchen venlafaxine XR (EFFEXOR-XR) 37.5 MG 24 hr capsule Take 1 capsule (37.5 mg total) by mouth daily with breakfast. 30 capsule 1  . vitamin B-12 (CYANOCOBALAMIN) 1000 MCG tablet Take 1,000 mcg by mouth daily.     No current facility-administered medications for this visit.     OBJECTIVE: Vitals:   12/11/17 1103  BP: (!) 155/92  Pulse: 76  Resp: 20  Temp: (!) 96.5 F (35.8 C)     Body mass index is 31.15 kg/m.    ECOG FS:0 - Asymptomatic  General: Well-developed, well-nourished, no acute distress. Eyes: Pink conjunctiva, anicteric sclera. Lungs: Clear to auscultation bilaterally. Heart: Regular rate and rhythm. No rubs, murmurs, or gallops. Abdomen: Soft, nontender, nondistended. No organomegaly noted, normoactive bowel sounds. Musculoskeletal: No edema, cyanosis, or clubbing. Neuro: Alert, answering all questions appropriately. Cranial  nerves grossly intact. Skin: No rashes or petechiae noted. Psych: Normal affect.   LAB RESULTS:  Lab Results  Component Value Date   NA 140 10/11/2017   K 3.9 10/11/2017   CL 101 10/11/2017   CO2 24 10/11/2017   GLUCOSE 99 10/11/2017   BUN 15 10/11/2017   CREATININE 0.92 10/11/2017   CALCIUM 9.2 10/11/2017   PROT 6.6 10/11/2017   ALBUMIN 4.5 10/11/2017   AST 22 10/11/2017   ALT 23 10/11/2017   ALKPHOS 53 10/11/2017   BILITOT 0.6 10/11/2017   GFRNONAA 86 10/11/2017   GFRAA 100 10/11/2017    Lab Results  Component Value Date   WBC 6.2 12/11/2017   NEUTROABS 4.1 12/11/2017   HGB 19.0 (H) 12/11/2017   HCT 55.7 (H) 12/11/2017   MCV 94.5 12/11/2017   PLT 207 12/11/2017     STUDIES: No results found.  ASSESSMENT: Secondary polycythemia  PLAN:  1. 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 Patient's hemoglobin has trended up and is now 19.0.  Goal hemoglobin will be approximately 17.0.  Proceed with 500 mL phlebotomy today. Return to clinic in 6 weeks for laboratory work and phlebotomy and then in 3 months for laboratory work, further evaluation, and consideration of phlebotomy.   2. Low testosterone: Continue testosterone as per urology. 3. Hypertension: Patient's blood pressure is elevated today, continue monitoring and treatment per primary care.  Patient expressed understanding and was in agreement with this plan. He also understands that He can call clinic at any time with any questions, concerns, or complaints.    Lloyd Huger, MD   12/14/2017 10:45 AM

## 2017-12-10 ENCOUNTER — Other Ambulatory Visit: Payer: Self-pay | Admitting: *Deleted

## 2017-12-10 DIAGNOSIS — D751 Secondary polycythemia: Secondary | ICD-10-CM

## 2017-12-10 NOTE — Progress Notes (Signed)
cbc

## 2017-12-11 ENCOUNTER — Encounter: Payer: Self-pay | Admitting: Oncology

## 2017-12-11 ENCOUNTER — Inpatient Hospital Stay: Payer: Medicare Other | Attending: Oncology

## 2017-12-11 ENCOUNTER — Encounter: Payer: Self-pay | Admitting: Family Medicine

## 2017-12-11 ENCOUNTER — Inpatient Hospital Stay: Payer: Medicare Other

## 2017-12-11 ENCOUNTER — Inpatient Hospital Stay (HOSPITAL_BASED_OUTPATIENT_CLINIC_OR_DEPARTMENT_OTHER): Payer: Medicare Other | Admitting: Oncology

## 2017-12-11 ENCOUNTER — Ambulatory Visit: Payer: Medicare Other | Admitting: Family Medicine

## 2017-12-11 VITALS — BP 138/78 | HR 72 | Ht 73.0 in | Wt 237.0 lb

## 2017-12-11 VITALS — BP 168/80 | HR 80 | Resp 18

## 2017-12-11 VITALS — BP 155/92 | HR 76 | Temp 96.5°F | Resp 20 | Wt 236.1 lb

## 2017-12-11 DIAGNOSIS — I10 Essential (primary) hypertension: Secondary | ICD-10-CM | POA: Diagnosis not present

## 2017-12-11 DIAGNOSIS — I2510 Atherosclerotic heart disease of native coronary artery without angina pectoris: Secondary | ICD-10-CM | POA: Diagnosis not present

## 2017-12-11 DIAGNOSIS — E291 Testicular hypofunction: Secondary | ICD-10-CM | POA: Diagnosis not present

## 2017-12-11 DIAGNOSIS — E785 Hyperlipidemia, unspecified: Secondary | ICD-10-CM

## 2017-12-11 DIAGNOSIS — D751 Secondary polycythemia: Secondary | ICD-10-CM | POA: Diagnosis not present

## 2017-12-11 LAB — CBC WITH DIFFERENTIAL/PLATELET
BASOS ABS: 0 10*3/uL (ref 0–0.1)
BASOS PCT: 1 %
EOS PCT: 3 %
Eosinophils Absolute: 0.2 10*3/uL (ref 0–0.7)
HEMATOCRIT: 55.7 % — AB (ref 40.0–52.0)
Hemoglobin: 19 g/dL — ABNORMAL HIGH (ref 13.0–18.0)
LYMPHS PCT: 21 %
Lymphs Abs: 1.3 10*3/uL (ref 1.0–3.6)
MCH: 32.3 pg (ref 26.0–34.0)
MCHC: 34.1 g/dL (ref 32.0–36.0)
MCV: 94.5 fL (ref 80.0–100.0)
Monocytes Absolute: 0.6 10*3/uL (ref 0.2–1.0)
Monocytes Relative: 9 %
NEUTROS ABS: 4.1 10*3/uL (ref 1.4–6.5)
Neutrophils Relative %: 66 %
PLATELETS: 207 10*3/uL (ref 150–440)
RBC: 5.89 MIL/uL (ref 4.40–5.90)
RDW: 13.7 % (ref 11.5–14.5)
WBC: 6.2 10*3/uL (ref 3.8–10.6)

## 2017-12-11 NOTE — Progress Notes (Signed)
BP 138/78 (BP Location: Left Arm)   Pulse 72   Ht 6\' 1"  (1.854 m)   Wt 237 lb (107.5 kg)   SpO2 98%   BMI 31.27 kg/m    Subjective:    Patient ID: Kenneth Pruitt, male    DOB: [birth year], 67 y.o.   MRN: [id number]  HPI: Kenneth Pruitt is a 67 y.o. male  Chief Complaint  Patient presents with  . Follow-up  . Hypertension   Patient follow-up undergoing a lot of change switching from venlafaxine which may be contributing to hypertensive changes to Lexapro. Cardiology is increasing metoprolol for blood pressure control because of ongoing fatigue is scheduling a sleep study.  Patient does sound like sleep apnea symptoms which may also be contributing to blood pressure issues. Patient with complications from taking increase metoprolol is is very physically active bicycle rider and feels like with metoprolol feels held back. Also his summer heat starts and is unable to take hydrochlorothiazide because of cramping heavy sweating with bicycle riding Relevant past medical, surgical, family and social history reviewed and updated as indicated. Interim medical history since our last visit reviewed. Allergies and medications reviewed and updated.  Review of Systems  Constitutional: Negative.   Respiratory: Negative.   Cardiovascular: Negative.     Per HPI unless specifically indicated above     Objective:    BP 138/78 (BP Location: Left Arm)   Pulse 72   Ht 6\' 1"  (1.854 m)   Wt 237 lb (107.5 kg)   SpO2 98%   BMI 31.27 kg/m   Wt Readings from Last 3 Encounters:  12/11/17 237 lb (107.5 kg)  12/11/17 236 lb 1.6 oz (107.1 kg)  10/16/17 254 lb (115.2 kg)    Physical Exam  Constitutional: He is oriented to person, place, and time. He appears well-developed and well-nourished.  HENT:  Head: Normocephalic and atraumatic.  Eyes: Conjunctivae and EOM are normal.  Neck: Normal range of motion.  Cardiovascular: Normal rate, regular rhythm and normal heart sounds.  Pulmonary/Chest:  Effort normal and breath sounds normal.  Musculoskeletal: Normal range of motion.  Neurological: He is alert and oriented to person, place, and time.  Skin: No erythema.  Psychiatric: He has a normal mood and affect. His behavior is normal. Judgment and thought content normal.    Results for orders placed or performed in visit on 12/11/17  CBC with Differential/Platelet  Result Value Ref Range   WBC 6.2 3.8 - 10.6 K/uL   RBC 5.89 4.40 - 5.90 MIL/uL   Hemoglobin 19.0 (H) 13.0 - 18.0 g/dL   HCT 55.7 (H) 40.0 - 52.0 %   MCV 94.5 80.0 - 100.0 fL   MCH 32.3 26.0 - 34.0 pg   MCHC 34.1 32.0 - 36.0 g/dL   RDW 13.7 11.5 - 14.5 %   Platelets 207 150 - 440 K/uL   Neutrophils Relative % 66 %   Neutro Abs 4.1 1.4 - 6.5 K/uL   Lymphocytes Relative 21 %   Lymphs Abs 1.3 1.0 - 3.6 K/uL   Monocytes Relative 9 %   Monocytes Absolute 0.6 0.2 - 1.0 K/uL   Eosinophils Relative 3 %   Eosinophils Absolute 0.2 0 - 0.7 K/uL   Basophils Relative 1 %   Basophils Absolute 0.0 0 - 0.1 K/uL      Assessment & Plan:   Problem List Items Addressed This Visit      Cardiovascular and Mediastinum   Coronary artery disease  The current medical regimen is effective;  continue present plan and medications.       Essential hypertension - Primary    Hypertension currently controlled but with side effects to medications will continue current medications hope of weight loss possible sleep apnea and/or Effexor contributing to blood pressure and not needing as much metoprolol.      Relevant Orders   Basic metabolic panel     Endocrine   Hypogonadism in male    Managed by urology has polycythemia complications and had blood drainage today.        Other   Hyperlipidemia   Relevant Orders   Basic metabolic panel       Follow up plan: Return in about 2 months (around 02/10/2018) for Recheck blood pressure medications.

## 2017-12-11 NOTE — Progress Notes (Signed)
Patient denies any concerns today.  

## 2017-12-11 NOTE — Assessment & Plan Note (Signed)
Managed by urology has polycythemia complications and had blood drainage today.

## 2017-12-11 NOTE — Assessment & Plan Note (Signed)
Hypertension currently controlled but with side effects to medications will continue current medications hope of weight loss possible sleep apnea and/or Effexor contributing to blood pressure and not needing as much metoprolol.

## 2017-12-11 NOTE — Assessment & Plan Note (Signed)
The current medical regimen is effective;  continue present plan and medications.  

## 2017-12-12 ENCOUNTER — Ambulatory Visit: Payer: Medicare Other | Admitting: Oncology

## 2017-12-12 ENCOUNTER — Other Ambulatory Visit: Payer: Medicare Other

## 2017-12-16 ENCOUNTER — Other Ambulatory Visit: Payer: Self-pay

## 2017-12-16 ENCOUNTER — Ambulatory Visit: Payer: Medicare Other | Admitting: Psychiatry

## 2017-12-16 ENCOUNTER — Encounter: Payer: Self-pay | Admitting: Psychiatry

## 2017-12-16 VITALS — BP 177/88 | HR 76 | Temp 98.6°F | Wt 239.0 lb

## 2017-12-16 DIAGNOSIS — F330 Major depressive disorder, recurrent, mild: Secondary | ICD-10-CM | POA: Diagnosis not present

## 2017-12-16 DIAGNOSIS — F411 Generalized anxiety disorder: Secondary | ICD-10-CM | POA: Diagnosis not present

## 2017-12-16 MED ORDER — VENLAFAXINE HCL ER 37.5 MG PO CP24: 37.5000 mg | ORAL_CAPSULE | Freq: Every day | ORAL | 1 refills | Status: DC

## 2017-12-16 MED ORDER — ESCITALOPRAM OXALATE 10 MG PO TABS: 15.0000 mg | ORAL_TABLET | Freq: Every day | ORAL | 1 refills | Status: DC

## 2017-12-16 NOTE — Progress Notes (Signed)
[facility] MD OP Progress Note  12/16/2017 12:52 PM Kenneth Pruitt  MRN:  [id number]  Chief Complaint: 'Its been challenging." Chief Complaint    Follow-up; Medication Refill     HPI: Kenneth Pruitt is a 67 year old Caucasian male, retired, married, lives in [place], has a history of depression and anxiety, presented to the clinic today for a follow-up visit.  Kenneth Pruitt reports the past 2 weeks has been challenging since he has started tapering himself off of the Effexor XR.  Patient reports he had some increased anxiety symptoms and restlessness when he started the new medication and went down on the Effexor XR.  Patient reports however those symptoms are getting better and he is tolerating the Lexapro well.  He continues to feel tired during the day and also has some excessive sleepiness during the daytime.  He reports he continues to take 2 hours of nap in the afternoon.  He had a recent sleep study done and is currently awaiting report.  He also reports he is trying to get out there and exercise more.  He reports he has been doing some biking and would like to do more.  He continues to struggle with the psychosocial stressor of dealing with his mom who is in a nursing home. He also gets support from his sister once in a week or so.  He is scheduled to see [name] for psychotherapy soon.  His blood pressure continues to be elevated and his PMD is currently managing the same.  He denies any other concerns today. Visit Diagnosis:    ICD-10-CM   1. MDD (major depressive disorder), recurrent episode, mild (HCC) F33.0 escitalopram (LEXAPRO) 10 MG tablet    venlafaxine XR (EFFEXOR-XR) 37.5 MG 24 hr capsule  2. GAD (generalized anxiety disorder) F41.1 escitalopram (LEXAPRO) 10 MG tablet    venlafaxine XR (EFFEXOR-XR) 37.5 MG 24 hr capsule    Past Psychiatric History: Reports a history of depression and anxiety in the past.  He reports he has tried antidepressants in the past.  He does not remember the  name of those medications because it was several years ago.  He reports he remembers he has tried Zoloft and Effexor in the past.  He reports he has been on Effexor since 2003.  He did try to taper it off however was not successful in the past.  Past Medical History:  Past Medical History:  Diagnosis Date  . Coronary artery disease   . Depression   . Hyperlipidemia     Past Surgical History:  Procedure Laterality Date  . CARDIAC CATHETERIZATION N/A 04/26/2015   Procedure: Left Heart Cath;  Surgeon: Dionisio David, MD;  Location: [facility];  Service: Cardiovascular;  Laterality: N/A;  . CORONARY ANGIOPLASTY    . EYE SURGERY Bilateral    cataract    Family Psychiatric History: Father - depression, both daughters-mental illness likely depression or anxiety.  Family History:  Family History  Problem Relation Age of Onset  . Diabetes Mother   . Hypertension Mother   . Cancer Father   . Depression Father   . Anxiety disorder Father   . Depression Daughter    Substance abuse history: Denies  Social History: [name] is married.  He lives in [place].  He used to be a Art therapist, currently retired.  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  His highest level of education is post graduate degree, major in music.  He enjoys cycling and woodworking.  He has 2 daughters who are adults.  He has 1 sister with whom he has a good relationship with.  His father is deceased.  His mother is currently in the nursing home and he helps take care of her. Social History   Socioeconomic History  . Marital status: Married    Spouse name: ann  . Number of children: 2  . Years of education: Not on file  . Highest education level: Master's degree (e.g., MA, MS, MEng, MEd, MSW, MBA)  Occupational History    Comment: retired  Scientific laboratory technician  . Financial resource strain: Not hard at all  . Food insecurity:    Worry: Never true    Inability: Never true  . Transportation needs:     Medical: No    Non-medical: No  Tobacco Use  . Smoking status: Former Smoker    Years: 30.00    Types: Cigars    Last attempt to quit: 07/18/2006    Years since quitting: 11.4  . Smokeless tobacco: Never Used  Substance and Sexual Activity  . Alcohol use: No  . Drug use: No  . Sexual activity: Not Currently  Lifestyle  . Physical activity:    Days per week: 0 days    Minutes per session: 0 min  . Stress: Not at all  Relationships  . Social connections:    Talks on phone: More than three times a week    Gets together: More than three times a week    Attends religious service: More than 4 times per year    Active member of club or organization: Yes    Attends meetings of clubs or organizations: More than 4 times per year    Relationship status: Married  Other Topics Concern  . Not on file  Social History Narrative  . Not on file    Allergies:  Allergies  Allergen Reactions  . Clonidine Derivatives Shortness Of Breath    Low heart rate    Metabolic Disorder Labs: No results found for: HGBA1C, MPG No results found for: PROLACTIN Lab Results  Component Value Date   CHOL 100 10/11/2017   TRIG 130 10/11/2017   HDL 33 (L) 10/11/2017   CHOLHDL 3.0 10/11/2017   VLDL 38 (H) 08/06/2016   LDLCALC 41 10/11/2017   LDLCALC 33 02/05/2017   Lab Results  Component Value Date   TSH 1.030 02/05/2017   TSH 1.270 02/02/2016    Therapeutic Level Labs: No results found for: LITHIUM No results found for: VALPROATE No components found for:  CBMZ  Current Medications: Current Outpatient Medications  Medication Sig Dispense Refill  . amLODipine (NORVASC) 10 MG tablet Take 1 tablet (10 mg total) by mouth daily. 90 tablet 4  . Cholecalciferol ([employer]S VIT D 5000 HIGH-POTENCY) 5000 units capsule Take 5,000 Units by mouth daily.    . clopidogrel (PLAVIX) 75 MG tablet Take 75 mg by mouth daily.    Marland Kitchen dutasteride (AVODART) 0.5 MG capsule Take 1 capsule (0.5 mg total) by mouth daily. 90  capsule 4  . escitalopram (LEXAPRO) 10 MG tablet Take 1.5 tablets (15 mg total) by mouth at bedtime. 45 tablet 1  . fluticasone (FLONASE) 50 MCG/ACT nasal spray USE 2 SPRAYS IN EACH NOSTRIL DAILY 16 g 6  . hydrALAZINE (APRESOLINE) 50 MG tablet     . hydrochlorothiazide (HYDRODIURIL) 25 MG tablet Take 1 tablet (25 mg total) by mouth daily. 90 tablet 4  . hydrOXYzine (VISTARIL) 25 MG capsule Take 1 capsule (25  mg total) by mouth 2 (two) times daily as needed for anxiety (only for severe anxiety sx and also for sleep). 60 capsule 1  . losartan (COZAAR) 100 MG tablet Take 1 tablet (100 mg total) by mouth daily. 90 tablet 4  . meloxicam (MOBIC) 15 MG tablet Take 1 tablet (15 mg total) by mouth daily. 30 tablet 6  . metoprolol succinate (TOPROL-XL) 50 MG 24 hr tablet     . mupirocin ointment (BACTROBAN) 2 % APPLY AS DIRECTED 22 g 1  . niacin (NIASPAN) 1000 MG CR tablet Take 2 tablets (2,000 mg total) by mouth at bedtime. 180 tablet 4  . Omega-3 Fatty Acids (FISH OIL) 1000 MG CAPS Take 1,000 capsules by mouth.    . pantoprazole (PROTONIX) 40 MG tablet 40 mg daily.    Marland Kitchen PROCTO-MED HC 2.5 % rectal cream APPLY RECTALLY TWICE DAILY AS DIRECTED 28.35 g 1  . rosuvastatin (CRESTOR) 10 MG tablet Take 10 mg by mouth at bedtime.    . tamsulosin (FLOMAX) 0.4 MG CAPS capsule Take 1 capsule (0.4 mg total) by mouth daily. 90 capsule 4  . testosterone cypionate (DEPOTESTOSTERONE CYPIONATE) 200 MG/ML injection INJECT 0.75MLS INTRAMUSCULARLY EVERY 10 DAYS 10 mL 1  . Turmeric 450 MG CAPS Take 1 capsule by mouth daily.    Marland Kitchen venlafaxine XR (EFFEXOR-XR) 37.5 MG 24 hr capsule Take 1 capsule (37.5 mg total) by mouth daily with breakfast. Take it every other day - being weaned off 15 capsule 1  . vitamin B-12 (CYANOCOBALAMIN) 1000 MCG tablet Take 1,000 mcg by mouth daily.     No current facility-administered medications for this visit.      Musculoskeletal: Strength & Muscle Tone: within normal limits Gait & Station:  normal Patient leans: N/A  Psychiatric Specialty Exam: Review of Systems  Psychiatric/Behavioral: The patient is nervous/anxious.   All other systems reviewed and are negative.   Blood pressure (!) 177/88, pulse 76, temperature 98.6 F (37 C), temperature source Oral, weight 239 lb (108.4 kg).Body mass index is 31.53 kg/m.  General Appearance: Casual  Eye Contact:  Fair  Speech:  Normal Rate  Volume:  Normal  Mood:  Anxious  Affect:  Congruent  Thought Process:  Goal Directed and Descriptions of Associations: Intact  Orientation:  Full (Time, Place, and Person)  Thought Content: Logical   Suicidal Thoughts:  No  Homicidal Thoughts:  No  Memory:  Immediate;   Fair Recent;   Fair Remote;   Fair  Judgement:  Fair  Insight:  Fair  Psychomotor Activity:  Normal  Concentration:  Concentration: Fair and Attention Span: Fair  Recall:  [employer] of Knowledge: Fair  Language: Fair  Akathisia:  No  Handed:  Right  AIMS (if indicated): NA  Assets:  Communication Skills Desire for Improvement Housing Intimacy Social Support Talents/Skills Transportation  ADL's:  Intact  Cognition: WNL  Sleep:  Fair   Screenings: PHQ2-9     Clinical Support from 10/11/2017 in [facility] Visit from 03/12/2017 in [facility] Visit from 02/02/2016 in [facility]  PHQ-2 Total Score  6  1  1   PHQ-9 Total Score  24  -  -       Assessment and Plan: Authur is a 67 year old Caucasian male who has a history of depression, anxiety as well as multiple medical problems including essential hypertension, coronary artery disease, status post stent placement, hyperlipidemia, BPH, presented to the clinic today for a follow-up visit.  Patient  today is tolerating the Lexapro well.  He continues to struggle with anxiety symptoms especially since Effexor is being weaned off.  Discussed medication readjustment with patient today.  He will also start psychotherapy  with [name] soon.  Continue plan as noted below.  Plan MDD Discussed with patient to take Effexor XR 37.5 mg every other day until he comes back to see me. Increase Lexapro to 15 mg p.o. daily. CBT with [name] -scheduled to see her on 10 April.  GAD Increase Lexapro to 15 mg p.o. nightly Continue hydroxyzine 25 mg p.o. twice daily as needed for severe anxiety attacks Taper of Effexor X are 37.5 mg Continue CBT  For insomnia He reports he is sleeping well at night.  He however reports sleeping excessively during daytime continues to take naps daily. Discussed with him about being active, getting out and doing activities that he enjoys like biking, walking and so on Had sleep study done, pending report.  Follow-up in clinic in 4 weeks or sooner if needed.  More than 50 % of the time was spent for psychoeducation and supportive psychotherapy and care coordination.  This note was generated in part or whole with voice recognition software. Voice recognition is usually quite accurate but there are transcription errors that can and very often do occur. I apologize for any typographical errors that were not detected and corrected.        Ursula Alert, MD 12/16/2017, 12:52 PM

## 2017-12-25 ENCOUNTER — Ambulatory Visit: Payer: Medicare Other | Admitting: Licensed Clinical Social Worker

## 2017-12-25 DIAGNOSIS — F411 Generalized anxiety disorder: Secondary | ICD-10-CM | POA: Diagnosis not present

## 2017-12-25 DIAGNOSIS — F330 Major depressive disorder, recurrent, mild: Secondary | ICD-10-CM | POA: Diagnosis not present

## 2017-12-25 NOTE — Progress Notes (Signed)
Comprehensive Clinical Assessment (CCA) Note  12/25/2017 Kenneth Pruitt [id number]  Visit Diagnosis:      ICD-10-CM   1. MDD (major depressive disorder), recurrent episode, mild ([facility]) F33.0   2. GAD (generalized anxiety disorder) F41.1       CCA Part One  Part One has been completed on paper by the patient.  (See scanned document in Chart Review)  CCA Part Two A  Intake/Chief Complaint:  CCA Intake With Chief Complaint CCA Part Two Date: 12/25/17 CCA Part Two Time: 1009 Chief Complaint/Presenting Problem: I have seen Dr. Shea Evans for medication and she wants me to talk to you about some things. Patients Currently Reported Symptoms/Problems: I have a history of depression in my family.  My paternal grandmother committ suicide. My father was in WWII and severely depressed. He was in and out of hospitals. I have battled depression all my life. Both of my daughters have depression. My mother in law died a year ago.  My father had cancer and my wife and I took care of him.  I was with him when he died. I take care of my mother for the past 4 years.  SHe will be 92 in June.  She is not at [facility] since November.  Al things came crashing down on my this winter.  I was on stroke watch for a while. I have been on Effexor for 17 years.  My mind is foggy and cloudy.  I have thoughts bouncing around in my mind.  My life is like I am looking through sunglasses; everything is dark.  I have no time for myself. I have hard time catching my breath most days.  I have ahard time and a loss in appetite.  Individual's Strengths: 2 degrees in music, band director Individual's Preferences: happy and see the bright side of things Individual's Abilities: communicates well Type of Services Patient Feels Are Needed: therapy, med management  Mental Health Symptoms Depression:  Depression: Change in energy/activity, Difficulty Concentrating, Fatigue, Sleep (too much or little), Worthlessness  Mania:  Mania:  N/A  Anxiety:   Anxiety: Worrying, Tension, Sleep  Psychosis:  Psychosis: N/A  Trauma:  Trauma: N/A  Obsessions:  Obsessions: N/A  Compulsions:  Compulsions: N/A  Inattention:  Inattention: N/A  Hyperactivity/Impulsivity:  Hyperactivity/Impulsivity: N/A  Oppositional/Defiant Behaviors:  Oppositional/Defiant Behaviors: N/A  Borderline Personality:  Emotional Irregularity: N/A  Other Mood/Personality Symptoms:      Mental Status Exam Appearance and self-care  Stature:     Weight:     Clothing:     Grooming:     Cosmetic use:     Posture/gait:     Motor activity:     Sensorium  Attention:  Attention: Normal  Concentration:  Concentration: Normal  Orientation:  Orientation: X5  Recall/memory:  Recall/Memory: Normal  Affect and Mood  Affect:  Affect: Appropriate  Mood:  Mood: Pessimistic  Relating  Eye contact:  Eye Contact: Normal  Facial expression:  Facial Expression: Responsive  Attitude toward examiner:  Attitude Toward Examiner: Cooperative  Thought and Language  Speech flow: Speech Flow: Normal  Thought content:  Thought Content: Appropriate to mood and circumstances  Preoccupation:     Hallucinations:     Organization:     Transport planner of Knowledge:  Fund of Knowledge: Average  Intelligence:  Intelligence: Average  Abstraction:  Abstraction: Normal  Judgement:  Judgement: Normal  Reality Testing:  Reality Testing: Adequate  Insight:  Insight: Good  Decision Making:  Decision Making: Normal  Social Functioning  Social Maturity:  Social Maturity: Responsible  Social Judgement:  Social Judgement: Normal  Stress  Stressors:  Stressors: Transitions  Coping Ability:  Coping Ability: English as a second language teacher Deficits:     Supports:      Family and Psychosocial History: Family history Marital status: Married Number of Years Married: 47 What types of issues is patient dealing with in the relationship?: not really close anymore.   Are you sexually active?:  Yes What is your sexual orientation?: heterosexual Does patient have children?: Yes How many children?: 2(Katherine 56, Rebecca 32) How is patient's relationship with their children?: it was fun while they were growing up.  Belenda Cruise is stand off ish.  She is independent and married.  Wells Guiles is clingy.  SHe has 2 young girls.  I have a great relationship with my daughters.   Childhood History:  Childhood History By whom was/is the patient raised?: Both parents(we never went on family vacation.  i was sheltered. ) Additional childhood history information: My father was always negative. He was in WWII.  we moved around a lot.  Description of patient's relationship with caregiver when they were a child: Mother: she was always old school.  she stayed home.  Father: he was strict.   Patient's description of current relationship with people who raised him/her: Mother: she lives  Does patient have siblings?: Yes Number of Siblings: 1(Susan 60) Description of patient's current relationship with siblings: My sister and I have a good relationship.  SHe is an Forensic psychologist.   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 Where was the patient employed at that time?: [employer]: Education Name of [employer]: [employer]man [employer]  Did Teacher, adult education From [employer]?: Yes Did Physicist, medical?: Yes What Type of College Degree Do you Have?: Master's Music Ed Did You Attend  Graduate School?: Yes What is Your Teacher, English as a foreign language Degree?: Master What Was Your Major?: Music Ed Did You Have Any Special Interests In School?: oobo  Did You Have An Individualized Education Program (IIEP): No Did You Have Any Difficulty At [employer]?: No  Religion: Religion/Spirituality Are You A Religious Person?: Yes What is Your Religious Affiliation?: Methodist How Might This Affect Treatment?: denies  Leisure/Recreation: Leisure / Recreation Leisure and Hobbies: riding bicycles, woodworking, animals, raise birds, dogs  Exercise/Diet: Exercise/Diet Do You Exercise?: Yes What Type of Exercise Do You Do?: Run/Walk, Other (Comment)(cycling) How Many Times a Week Do You Exercise?: 1-3 times a week Have You Gained or Lost A Significant Amount of Weight in the Past Six Months?: No Do You Follow a Special Diet?: No Do You Have Any Trouble Sleeping?: Yes Explanation of Sleeping Difficulties: difficulty staying asleep  CCA Part Two C  Alcohol/Drug Use: Alcohol / Drug Use Pain Medications: maloxicam Over the Counter: fish oil, vitamin d, b12, vitamin b6 History of alcohol / drug use?: No history of alcohol / drug abuse  CCA Part Three  ASAM's:  Six Dimensions of Multidimensional Assessment  Dimension 1:  Acute Intoxication and/or Withdrawal Potential:     Dimension 2:  Biomedical Conditions and Complications:     Dimension 3:  Emotional, Behavioral, or Cognitive Conditions and Complications:     Dimension 4:  Readiness to Change:     Dimension 5:  Relapse, Continued use, or Continued Problem Potential:     Dimension 6:  Recovery/Living Environment:      Substance use Disorder (SUD)    Social Function:  Social Functioning Social Maturity: Responsible Social Judgement: Normal  Stress:  Stress Stressors: Transitions Coping Ability: Overwhelmed Patient Takes Medications The Way The Doctor Instructed?: Yes Priority Risk: Low Acuity  Risk  Assessment- Self-Harm Potential: Risk Assessment For Self-Harm Potential Thoughts of Self-Harm: No current thoughts Method: No plan Availability of Means: No access/NA  Risk Assessment -Dangerous to Others Potential: Risk Assessment For Dangerous to Others Potential Method: No Plan Availability of Means: No access or NA Intent: Vague intent or NA Notification Required: No need or identified person  DSM5 Diagnoses: Patient Active Problem List   Diagnosis Date Noted  . Skin lesions 10/16/2017  . BPH (benign prostatic hyperplasia) 10/16/2017  . Advanced care planning/counseling discussion 02/05/2017  . Polycythemia, secondary 08/28/2016  . Elevated hematocrit 08/07/2016  . Knee pain, right 08/06/2016  . Elevated PSA 08/06/2016  . Hypogonadism in male 11/17/2015  . Coronary artery disease 11/17/2015  . Depression 11/17/2015  . Hyperlipidemia 11/17/2015  . Essential hypertension 11/17/2015  . Erectile dysfunction 03/07/2015  . Arthritis of knee 12/18/2011  . Primary localized osteoarthrosis, lower leg 11/21/2011    Patient Centered Plan: Patient is on the following Treatment Plan(s):  Anxiety and Depression  Recommendations for Services/Supports/Treatments: Recommendations for Services/Supports/Treatments Recommendations For Services/Supports/Treatments: Individual Therapy, Medication Management  Treatment Plan Summary:    Referrals to Alternative Service(s): Referred to Alternative Service(s):   Place:   Date:   Time:    Referred to Alternative Service(s):   Place:   Date:   Time:    Referred to Alternative Service(s):   Place:   Date:   Time:    Referred to Alternative Service(s):   Place:   Date:   Time:     [place]

## 2017-12-27 ENCOUNTER — Telehealth: Payer: Self-pay

## 2017-12-27 NOTE — Telephone Encounter (Signed)
error 

## 2018-01-15 ENCOUNTER — Encounter: Payer: Self-pay | Admitting: Psychiatry

## 2018-01-15 ENCOUNTER — Other Ambulatory Visit: Payer: Self-pay

## 2018-01-15 ENCOUNTER — Ambulatory Visit: Payer: Medicare Other | Admitting: Psychiatry

## 2018-01-15 VITALS — BP 139/75 | HR 85 | Temp 97.7°F | Wt 233.4 lb

## 2018-01-15 DIAGNOSIS — G4733 Obstructive sleep apnea (adult) (pediatric): Secondary | ICD-10-CM

## 2018-01-15 DIAGNOSIS — F411 Generalized anxiety disorder: Secondary | ICD-10-CM | POA: Diagnosis not present

## 2018-01-15 DIAGNOSIS — F330 Major depressive disorder, recurrent, mild: Secondary | ICD-10-CM

## 2018-01-15 DIAGNOSIS — F5105 Insomnia due to other mental disorder: Secondary | ICD-10-CM | POA: Diagnosis not present

## 2018-01-15 MED ORDER — ARIPIPRAZOLE 2 MG PO TABS: 2.0000 mg | ORAL_TABLET | Freq: Every day | ORAL | 1 refills | Status: DC

## 2018-01-15 MED ORDER — ESCITALOPRAM OXALATE 10 MG PO TABS: 15.0000 mg | ORAL_TABLET | Freq: Every day | ORAL | 1 refills | Status: DC

## 2018-01-15 NOTE — Progress Notes (Signed)
[facility] MD OP Progress Note  01/15/2018 10:57 AM Kenneth Pruitt  MRN:  [id number]  Chief Complaint: ' I have some mood sx." Chief Complaint    Follow-up; Medication Refill     HPI: Kenneth Pruitt is a 67 year old Caucasian male, retired, married, lives in [place], has a history of depression and anxiety, presented to the clinic today for a follow-up visit.  Patient today reports he continues to have some possible withdrawal symptoms from getting off of Effexor.  He reports he continues to wake up in the morning and has some low mood.  He also has  anhedonia as well as lack of motivation.  He reports he tried biking recently but he did not do have the same interest that he had before.  He reports he has not gone for a 500 mile bike ride in the past, most recently in October 2018.  He reports he was having some excessive sleepiness during the daytime.  He had a sleep study done and he was told he has obstructive sleep apnea.  He reports he is waiting for his CPAP.  He reports he continues to have psychosocial stressor of his mother who is elderly who has several medical issues .  His mother is in a nursing facility however he continues to get calls from her several times a day.  He reports this morning he was trying to sleep and he got a call early morning.  He reports his mother has never been a happy person.  He however understands that his attitude is what needs to change.  He denies any suicidality or perceptual disturbances.  Denies abusing any drugs or alcohol. Visit Diagnosis:    ICD-10-CM   1. MDD (major depressive disorder), recurrent episode, mild ([facility]) F33.0 escitalopram (LEXAPRO) 10 MG tablet  2. GAD (generalized anxiety disorder) F41.1 escitalopram (LEXAPRO) 10 MG tablet  3. Insomnia due to mental disorder F51.05   4. OSA (obstructive sleep apnea) G47.33     Past Psychiatric History: I have reviewed past psychiatric history from my progress note on 12/16/2017.  Past Medical History:   Past Medical History:  Diagnosis Date  . Coronary artery disease   . Depression   . Hyperlipidemia     Past Surgical History:  Procedure Laterality Date  . CARDIAC CATHETERIZATION N/A 04/26/2015   Procedure: Left Heart Cath;  Surgeon: Dionisio David, MD;  Location: [facility];  Service: Cardiovascular;  Laterality: N/A;  . CORONARY ANGIOPLASTY    . EYE SURGERY Bilateral    cataract    Family Psychiatric History: I have reviewed family psychiatric history from my progress note on 12/16/2017.  Family History:  Family History  Problem Relation Age of Onset  . Diabetes Mother   . Hypertension Mother   . Cancer Father   . Depression Father   . Anxiety disorder Father   . Depression Daughter    Substance abuse history: Denies  Social History: Pt is  married.  He lives in [place].  He used to be a Art therapist, currently retired.  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  His highest level of education is post graduate degree, major in music.  He enjoys cycling and woodworking.  He has 2 daughters who are adults.  He has 1 sister with whom he has a good relationship with.  His mother is in a nursing home. Social History   Socioeconomic History  . Marital status: Married    Spouse  name: ann  . Number of children: 2  . Years of education: Not on file  . Highest education level: Master's degree (e.g., MA, MS, MEng, MEd, MSW, MBA)  Occupational History    Comment: retired  Scientific laboratory technician  . Financial resource strain: Not hard at all  . Food insecurity:    Worry: Never true    Inability: Never true  . Transportation needs:    Medical: No    Non-medical: No  Tobacco Use  . Smoking status: Former Smoker    Years: 30.00    Types: Cigars    Last attempt to quit: 07/18/2006    Years since quitting: 11.5  . Smokeless tobacco: Never Used  Substance and Sexual Activity  . Alcohol use: No  . Drug use: No  . Sexual activity: Not Currently  Lifestyle  .  Physical activity:    Days per week: 0 days    Minutes per session: 0 min  . Stress: Not at all  Relationships  . Social connections:    Talks on phone: More than three times a week    Gets together: More than three times a week    Attends religious service: More than 4 times per year    Active member of club or organization: Yes    Attends meetings of clubs or organizations: More than 4 times per year    Relationship status: Married  Other Topics Concern  . Not on file  Social History Narrative  . Not on file    Allergies:  Allergies  Allergen Reactions  . Clonidine Derivatives Shortness Of Breath    Low heart rate    Metabolic Disorder Labs: No results found for: HGBA1C, MPG No results found for: PROLACTIN Lab Results  Component Value Date   CHOL 100 10/11/2017   TRIG 130 10/11/2017   HDL 33 (L) 10/11/2017   CHOLHDL 3.0 10/11/2017   VLDL 38 (H) 08/06/2016   LDLCALC 41 10/11/2017   LDLCALC 33 02/05/2017   Lab Results  Component Value Date   TSH 1.030 02/05/2017   TSH 1.270 02/02/2016    Therapeutic Level Labs: No results found for: LITHIUM No results found for: VALPROATE No components found for:  CBMZ  Current Medications: Current Outpatient Medications  Medication Sig Dispense Refill  . amLODipine (NORVASC) 10 MG tablet Take 1 tablet (10 mg total) by mouth daily. 90 tablet 4  . Cholecalciferol ([employer] VIT D 5000 HIGH-POTENCY) 5000 units capsule Take 5,000 Units by mouth daily.    . clopidogrel (PLAVIX) 75 MG tablet Take 75 mg by mouth daily.    Marland Kitchen dutasteride (AVODART) 0.5 MG capsule Take 1 capsule (0.5 mg total) by mouth daily. 90 capsule 4  . escitalopram (LEXAPRO) 10 MG tablet Take 1.5 tablets (15 mg total) by mouth at bedtime. 45 tablet 1  . fluticasone (FLONASE) 50 MCG/ACT nasal spray USE 2 SPRAYS IN EACH NOSTRIL DAILY 16 g 6  . hydrALAZINE (APRESOLINE) 50 MG tablet     . hydrochlorothiazide (HYDRODIURIL) 50 MG tablet Take 50 mg by mouth daily.    .  hydrOXYzine (VISTARIL) 25 MG capsule Take 1 capsule (25 mg total) by mouth 2 (two) times daily as needed for anxiety (only for severe anxiety sx and also for sleep). 60 capsule 1  . losartan (COZAAR) 100 MG tablet Take 1 tablet (100 mg total) by mouth daily. 90 tablet 4  . meloxicam (MOBIC) 15 MG tablet Take 1 tablet (15 mg total) by mouth daily. [place]  tablet 6  . mupirocin ointment (BACTROBAN) 2 % APPLY AS DIRECTED 22 g 1  . niacin (NIASPAN) 1000 MG CR tablet Take 2 tablets (2,000 mg total) by mouth at bedtime. 180 tablet 4  . Omega-3 Fatty Acids (FISH OIL) 1000 MG CAPS Take 1,000 capsules by mouth.    . pantoprazole (PROTONIX) 40 MG tablet 40 mg daily.    Marland Kitchen PROCTO-MED HC 2.5 % rectal cream APPLY RECTALLY TWICE DAILY AS DIRECTED 28.35 g 1  . rosuvastatin (CRESTOR) 10 MG tablet Take 10 mg by mouth at bedtime.    . tamsulosin (FLOMAX) 0.4 MG CAPS capsule Take 1 capsule (0.4 mg total) by mouth daily. 90 capsule 4  . testosterone cypionate (DEPOTESTOSTERONE CYPIONATE) 200 MG/ML injection INJECT 0.75MLS INTRAMUSCULARLY EVERY 10 DAYS 10 mL 1  . Turmeric 450 MG CAPS Take 1 capsule by mouth daily.    . vitamin B-12 (CYANOCOBALAMIN) 1000 MCG tablet Take 1,000 mcg by mouth daily.    . ARIPiprazole (ABILIFY) 2 MG tablet Take 1 tablet (2 mg total) by mouth at bedtime. For mood sx 30 tablet 1   No current facility-administered medications for this visit.      Musculoskeletal: Strength & Muscle Tone: within normal limits Gait & Station: normal Patient leans: N/A  Psychiatric Specialty Exam: Review of Systems  Psychiatric/Behavioral: Positive for depression. The patient is nervous/anxious and has insomnia.   All other systems reviewed and are negative.   Blood pressure 139/75, pulse 85, temperature 97.7 F (36.5 C), temperature source Oral, weight 233 lb 6.4 oz (105.9 kg).Body mass index is 30.79 kg/m.  General Appearance: Casual  Eye Contact:  Fair  Speech:  Normal Rate  Volume:  Normal  Mood:   Anxious and Dysphoric  Affect:  Congruent  Thought Process:  Goal Directed and Descriptions of Associations: Intact  Orientation:  Full (Time, Place, and Person)  Thought Content: Logical   Suicidal Thoughts:  No  Homicidal Thoughts:  No  Memory:  Immediate;   Fair Recent;   Fair Remote;   Fair  Judgement:  Fair  Insight:  Fair  Psychomotor Activity:  Normal  Concentration:  Concentration: Fair and Attention Span: Fair  Recall:  [employer] of Knowledge: Fair  Language: Fair  Akathisia:  No  Handed:  Right  AIMS (if indicated): na  Assets:  Communication Skills Desire for Improvement Housing Social Support Talents/Skills Transportation Vocational/Educational  ADL's:  Intact  Cognition: WNL  Sleep:  restless   Screenings: PHQ2-9     Clinical Support from 10/11/2017 in [facility] Visit from 03/12/2017 in [facility] Visit from 02/02/2016 in [facility]  PHQ-2 Total Score  6  1  1   PHQ-9 Total Score  24  -  -       Assessment and Plan: Kenneth Pruitt is a 67 year old Caucasian male who has a history of depression, anxiety as well as multiple medical problems including essential hypertension, coronary artery disease, status post stent placement, hyperlipidemia, BPH, presented to the clinic today for a follow-up visit.  He continues to have some mood symptoms, irritability and sleep issues.  He also could be possibly withdrawing from Effexor.  Discussed adding a mood stabilizer and he agrees with plan.  He will continue psychotherapy with [name].  Plan MDD Continue Lexapro 15 mg p.o. daily Discontinued Effexor for lack of efficacy Start Abilify 2 mg p.o. nightly Continue CBT with [name]  GAD Continue Lexapro 15 mg p.o. nightly Continue hydroxyzine  25 mg p.o. twice daily as needed for severe anxiety attacks Continue CBT with [name]  Insomnia He had sleep study done, diagnosed with obstructive sleep apnea  recently.  He is waiting for his CPAP  Follow up in clinic in 4 weeks or sooner if needed.  More than 50 % of the time was spent for psychoeducation and supportive psychotherapy and care coordination.  This note was generated in part or whole with voice recognition software. Voice recognition is usually quite accurate but there are transcription errors that can and very often do occur. I apologize for any typographical errors that were not detected and corrected.       Ursula Alert, MD 01/15/2018, 10:57 AM

## 2018-01-15 NOTE — Patient Instructions (Signed)
Aripiprazole tablets What is this medicine? ARIPIPRAZOLE (ay ri PIP ray zole) is an atypical antipsychotic. It is used to treat schizophrenia and bipolar disorder, also known as manic-depression. It is also used to treat Tourette's disorder and some symptoms of autism. This medicine may also be used in combination with antidepressants to treat major depressive disorder. This medicine may be used for other purposes; ask your health care provider or pharmacist if you have questions. COMMON BRAND NAME(S): Abilify What should I tell my health care provider before I take this medicine? They need to know if you have any of these conditions: -dehydration -dementia -diabetes -heart disease -history of stroke -low blood counts, like low white cell, platelet, or red cell counts -Parkinson's disease -seizures -suicidal thoughts, plans, or attempt; a previous suicide attempt by you or a family member -an unusual or allergic reaction to aripiprazole, other medicines, foods, dyes, or preservatives -pregnant or trying to get pregnant -breast-feeding How should I use this medicine? Take this medicine by mouth with a glass of water. Follow the directions on the prescription label. You can take this medicine with or without food. Take your doses at regular intervals. Do not take your medicine more often than directed. Do not stop taking except on the advice of your doctor or health care professional. A special MedGuide will be given to you by the pharmacist with each prescription and refill. Be sure to read this information carefully each time. Talk to your pediatrician regarding the use of this medicine in children. While this drug may be prescribed for children as young as 6 years of age for selected conditions, precautions do apply. Overdosage: If you think you have taken too much of this medicine contact a poison control center or emergency room at once. NOTE: This medicine is only for you. Do not share  this medicine with others. What if I miss a dose? If you miss a dose, take it as soon as you can. If it is almost time for your next dose, take only that dose. Do not take double or extra doses. What may interact with this medicine? Do not take this medicine with any of the following medications: -brexpiprazole -cisapride -dofetilide -dronedarone -metoclopramide -pimozide -thioridazine This medicine may also interact with the following medications: -alcohol -carbamazepine -certain medicines for anxiety or sleep -certain medicines for blood pressure -certain medicines for fungal infections like ketoconazole, fluconazole, posaconazole, and itraconazole -clarithromycin -fluoxetine -other medicines that prolong the QT interval (cause an abnormal heart rhythm) -paroxetine -quinidine -rifampin This list may not describe all possible interactions. Give your health care provider a list of all the medicines, herbs, non-prescription drugs, or dietary supplements you use. Also tell them if you smoke, drink alcohol, or use illegal drugs. Some items may interact with your medicine. What should I watch for while using this medicine? Visit your doctor or health care professional for regular checks on your progress. It may be several weeks before you see the full effects of this medicine. Do not suddenly stop taking this medicine. You may need to gradually reduce the dose. Patients and their families should watch out for worsening depression or thoughts of suicide. Also watch out for sudden changes in feelings such as feeling anxious, agitated, panicky, irritable, hostile, aggressive, impulsive, severely restless, overly excited and hyperactive, or not being able to sleep. If this happens, especially at the beginning of antidepressant treatment or after a change in dose, call your health care professional. You may get dizzy or drowsy. Do   not drive, use machinery, or do anything that needs mental  alertness until you know how this medicine affects you. Do not stand or sit up quickly, especially if you are an older patient. This reduces the risk of dizzy or fainting spells. Alcohol can increase dizziness and drowsiness. Avoid alcoholic drinks. This medicine can reduce the response of your body to heat or cold. Dress warm in cold weather and stay hydrated in hot weather. If possible, avoid extreme temperatures like saunas, hot tubs, very hot or cold showers, or activities that can cause dehydration such as vigorous exercise. This medicine may cause dry eyes and blurred vision. If you wear contact lenses you may feel some discomfort. Lubricating drops may help. See your eye doctor if the problem does not go away or is severe. If you notice an increased hunger or thirst, different from your normal hunger or thirst, or if you find that you have to urinate more frequently, you should contact your health care provider as soon as possible. You may need to have your blood sugar monitored. This medicine may cause changes in your blood sugar levels. You should monitor you blood sugar frequently if you have diabetes. There have been reports of uncontrollable and strong urges to gamble, binge eat, shop, and have sex while taking this medicine. If you experience any of these or other uncontrollable and strong urges while taking this medicine, you should report it to your health care provider as soon as possible. What side effects may I notice from receiving this medicine? Side effects that you should report to your doctor or health care professional as soon as possible: -allergic reactions like skin rash, itching or hives, swelling of the face, lips, or tongue -breathing problems -confusion -feeling faint or lightheaded, falls -fever or chills, sore throat -increased hunger or thirst -increased urination -joint pain -muscles pain, spasms -problems with balance, talking, walking -restlessness or need to  keep moving -seizures -suicidal thoughts or other mood changes -trouble swallowing -uncontrollable and excessive urges (examples: gambling, binge eating, shopping, having sex) -uncontrollable head, mouth, neck, arm, or leg movements -unusually weak or tired Side effects that usually do not require medical attention (report to your doctor or health care professional if they continue or are bothersome): -blurred vision -constipation -headache -nausea, vomiting -trouble sleeping -weight gain This list may not describe all possible side effects. Call your doctor for medical advice about side effects. You may report side effects to FDA at 1-800-FDA-1088. Where should I keep my medicine? Keep out of the reach of children. Store at room temperature between 15 and 30 degrees C (59 and 86 degrees F). Throw away any unused medicine after the expiration date. NOTE: This sheet is a summary. It may not cover all possible information. If you have questions about this medicine, talk to your doctor, pharmacist, or health care provider.  2018 Elsevier/Gold Standard (2016-08-19 11:45:05)  

## 2018-01-21 ENCOUNTER — Other Ambulatory Visit: Payer: Self-pay | Admitting: *Deleted

## 2018-01-21 DIAGNOSIS — D751 Secondary polycythemia: Secondary | ICD-10-CM

## 2018-01-22 ENCOUNTER — Inpatient Hospital Stay: Payer: Medicare Other

## 2018-01-22 ENCOUNTER — Inpatient Hospital Stay: Payer: Medicare Other | Attending: Oncology

## 2018-01-22 VITALS — BP 140/80 | HR 88

## 2018-01-22 DIAGNOSIS — D751 Secondary polycythemia: Secondary | ICD-10-CM

## 2018-01-22 LAB — CBC WITH DIFFERENTIAL/PLATELET
Basophils Absolute: 0.1 10*3/uL (ref 0–0.1)
Basophils Relative: 1 %
EOS PCT: 1 %
Eosinophils Absolute: 0.1 10*3/uL (ref 0–0.7)
HEMATOCRIT: 49.3 % (ref 40.0–52.0)
HEMOGLOBIN: 16.9 g/dL (ref 13.0–18.0)
LYMPHS ABS: 1.3 10*3/uL (ref 1.0–3.6)
LYMPHS PCT: 17 %
MCH: 31.7 pg (ref 26.0–34.0)
MCHC: 34.4 g/dL (ref 32.0–36.0)
MCV: 92.3 fL (ref 80.0–100.0)
MONOS PCT: 8 %
Monocytes Absolute: 0.6 10*3/uL (ref 0.2–1.0)
NEUTROS ABS: 5.4 10*3/uL (ref 1.4–6.5)
Neutrophils Relative %: 73 %
Platelets: 225 10*3/uL (ref 150–440)
RBC: 5.34 MIL/uL (ref 4.40–5.90)
RDW: 13.4 % (ref 11.5–14.5)
WBC: 7.3 10*3/uL (ref 3.8–10.6)

## 2018-01-28 ENCOUNTER — Other Ambulatory Visit: Payer: Self-pay | Admitting: Family Medicine

## 2018-01-28 ENCOUNTER — Other Ambulatory Visit: Payer: Medicare Other

## 2018-01-28 DIAGNOSIS — E291 Testicular hypofunction: Secondary | ICD-10-CM

## 2018-01-28 NOTE — Telephone Encounter (Signed)
Spoke with patient. He will come over now to have drawn.

## 2018-01-28 NOTE — Telephone Encounter (Signed)
Please have him come in for testosterone level- has not been done in a year.

## 2018-01-29 LAB — TESTOSTERONE, FREE, TOTAL, SHBG
SEX HORMONE BINDING: 28.2 nmol/L (ref 19.3–76.4)
Testosterone, Free: 15.8 pg/mL (ref 6.6–18.1)
Testosterone: 782 ng/dL (ref 264–916)

## 2018-01-30 ENCOUNTER — Ambulatory Visit: Payer: Medicare Other | Admitting: Licensed Clinical Social Worker

## 2018-01-30 DIAGNOSIS — F330 Major depressive disorder, recurrent, mild: Secondary | ICD-10-CM | POA: Diagnosis not present

## 2018-02-11 ENCOUNTER — Other Ambulatory Visit: Payer: Self-pay | Admitting: Psychiatry

## 2018-02-12 ENCOUNTER — Encounter: Payer: Self-pay | Admitting: Psychiatry

## 2018-02-12 ENCOUNTER — Ambulatory Visit: Payer: Medicare Other | Admitting: Psychiatry

## 2018-02-12 ENCOUNTER — Other Ambulatory Visit: Payer: Self-pay

## 2018-02-12 VITALS — BP 177/91 | HR 64 | Temp 98.4°F | Wt 236.6 lb

## 2018-02-12 DIAGNOSIS — F411 Generalized anxiety disorder: Secondary | ICD-10-CM | POA: Diagnosis not present

## 2018-02-12 DIAGNOSIS — F330 Major depressive disorder, recurrent, mild: Secondary | ICD-10-CM

## 2018-02-12 DIAGNOSIS — F5105 Insomnia due to other mental disorder: Secondary | ICD-10-CM

## 2018-02-12 NOTE — Progress Notes (Signed)
[facility] MD OP Progress Note  02/12/2018 1:01 PM Kenneth Pruitt  MRN:  [id number]  Chief Complaint: ' I am here for follow up." Chief Complaint    Follow-up; Medication Refill     HPI: Kenneth Pruitt is a 67 year old Caucasian male, retired, married, lives in [place], has a history of depression, anxiety, presented to the clinic today for a follow-up visit.  He reports he is compliant on his medications.  He denies any side effects to the medications.  He reports he takes the Lexapro and the Abilify as prescribed.  He reports good benefit from the same.  Denies any significant sadness, anhedonia or lack of motivation at this time.  He reports he has been able to participate in bike rides with his friends.  He reports that is an improvement.  He reports he feels like he is living his life again.  Patient reports his sleep is better.  He reports he used to struggle with excessive sleepiness during the day.  He however is currently on CPAP and that has been tremendously helpful.  He reports that he feels rested when he wakes up in the morning.  Patient continues to have psychosocial stressors of his mother who is elderly.  He however reports he is coping with it better.  He continues to be in psychotherapy with [name] .   Visit Diagnosis:    ICD-10-CM   1. MDD (major depressive disorder), recurrent episode, mild ([place]) F33.0   2. GAD (generalized anxiety disorder) F41.1   3. Insomnia due to mental disorder F51.05     Past Psychiatric History: I have reviewed past psychiatric history from my progress note on 12/16/2017  Past Medical History:  Past Medical History:  Diagnosis Date  . Coronary artery disease   . Depression   . Hyperlipidemia     Past Surgical History:  Procedure Laterality Date  . CARDIAC CATHETERIZATION N/A 04/26/2015   Procedure: Left Heart Cath;  Surgeon: Dionisio David, MD;  Location: [facility];  Service: Cardiovascular;  Laterality: N/A;  . CORONARY  ANGIOPLASTY    . EYE SURGERY Bilateral    cataract    Family Psychiatric History: Reviewed family psychiatric history from my progress note on 12/16/2017.  Family History:  Family History  Problem Relation Age of Onset  . Diabetes Mother   . Hypertension Mother   . Cancer Father   . Depression Father   . Anxiety disorder Father   . Depression Daughter   Substance abuse history: Denies   Social History: Pt is married.  He lives in [place].  He used to be a Art therapist, currently retired.  I have reviewed social history from my progress note on 12/16/2017 Social History   Socioeconomic History  . Marital status: Married    Spouse name: ann  . Number of children: 2  . Years of education: Not on file  . Highest education level: Master's degree (e.g., MA, MS, MEng, MEd, MSW, MBA)  Occupational History    Comment: retired  Scientific laboratory technician  . Financial resource strain: Not hard at all  . Food insecurity:    Worry: Never true    Inability: Never true  . Transportation needs:    Medical: No    Non-medical: No  Tobacco Use  . Smoking status: Former Smoker    Years: 30.00    Types: Cigars    Last attempt to quit: 07/18/2006    Years since quitting: 11.5  . Smokeless tobacco: Never  Used  Substance and Sexual Activity  . Alcohol use: No  . Drug use: No  . Sexual activity: Not Currently  Lifestyle  . Physical activity:    Days per week: 0 days    Minutes per session: 0 min  . Stress: Not at all  Relationships  . Social connections:    Talks on phone: More than three times a week    Gets together: More than three times a week    Attends religious service: More than 4 times per year    Active member of club or organization: Yes    Attends meetings of clubs or organizations: More than 4 times per year    Relationship status: Married  Other Topics Concern  . Not on file  Social History Narrative  . Not on file    Allergies:  Allergies  Allergen Reactions  .  Clonidine Derivatives Shortness Of Breath    Low heart rate    Metabolic Disorder Labs: No results found for: HGBA1C, MPG No results found for: PROLACTIN Lab Results  Component Value Date   CHOL 100 10/11/2017   TRIG 130 10/11/2017   HDL 33 (L) 10/11/2017   CHOLHDL 3.0 10/11/2017   VLDL 38 (H) 08/06/2016   LDLCALC 41 10/11/2017   LDLCALC 33 02/05/2017   Lab Results  Component Value Date   TSH 1.030 02/05/2017   TSH 1.270 02/02/2016    Therapeutic Level Labs: No results found for: LITHIUM No results found for: VALPROATE No components found for:  CBMZ  Current Medications: Current Outpatient Medications  Medication Sig Dispense Refill  . amLODipine (NORVASC) 10 MG tablet Take 1 tablet (10 mg total) by mouth daily. 90 tablet 4  . ARIPiprazole (ABILIFY) 2 MG tablet TAKE 1 TABLET BY MOUTH AT BEDTIME FOR MOOD SYMPTOMS 30 tablet 1  . Cholecalciferol ([employer] VIT D 5000 HIGH-POTENCY) 5000 units capsule Take 5,000 Units by mouth daily.    . clopidogrel (PLAVIX) 75 MG tablet Take 75 mg by mouth daily.    Marland Kitchen dutasteride (AVODART) 0.5 MG capsule Take 1 capsule (0.5 mg total) by mouth daily. 90 capsule 4  . escitalopram (LEXAPRO) 10 MG tablet Take 1.5 tablets (15 mg total) by mouth at bedtime. 45 tablet 1  . fluticasone (FLONASE) 50 MCG/ACT nasal spray USE 2 SPRAYS IN EACH NOSTRIL DAILY 16 g 6  . hydrALAZINE (APRESOLINE) 50 MG tablet     . hydrochlorothiazide (HYDRODIURIL) 50 MG tablet Take 50 mg by mouth daily.    . hydrOXYzine (VISTARIL) 25 MG capsule Take 1 capsule (25 mg total) by mouth 2 (two) times daily as needed for anxiety (only for severe anxiety sx and also for sleep). 60 capsule 1  . losartan (COZAAR) 100 MG tablet Take 1 tablet (100 mg total) by mouth daily. 90 tablet 4  . meloxicam (MOBIC) 15 MG tablet Take 1 tablet (15 mg total) by mouth daily. 30 tablet 6  . mupirocin ointment (BACTROBAN) 2 % APPLY AS DIRECTED 22 g 1  . niacin (NIASPAN) 1000 MG CR tablet Take 2 tablets  (2,000 mg total) by mouth at bedtime. 180 tablet 4  . Omega-3 Fatty Acids (FISH OIL) 1000 MG CAPS Take 1,000 capsules by mouth.    . pantoprazole (PROTONIX) 40 MG tablet 40 mg daily.    Marland Kitchen PROCTO-MED HC 2.5 % rectal cream APPLY RECTALLY TWICE DAILY AS DIRECTED 28.35 g 1  . rosuvastatin (CRESTOR) 10 MG tablet Take 10 mg by mouth at bedtime.    Marland Kitchen  tamsulosin (FLOMAX) 0.4 MG CAPS capsule Take 1 capsule (0.4 mg total) by mouth daily. 90 capsule 4  . testosterone cypionate (DEPOTESTOSTERONE CYPIONATE) 200 MG/ML injection INJECT 0.75 ML INTRAMUSCULARLY EVERY 10 DAYS 10 mL 3  . Turmeric 450 MG CAPS Take 1 capsule by mouth daily.    . vitamin B-12 (CYANOCOBALAMIN) 1000 MCG tablet Take 1,000 mcg by mouth daily.     No current facility-administered medications for this visit.      Musculoskeletal: Strength & Muscle Tone: within normal limits Gait & Station: normal Patient leans: N/A  Psychiatric Specialty Exam: Review of Systems  Psychiatric/Behavioral: The patient is nervous/anxious.   All other systems reviewed and are negative.   Blood pressure (!) 177/91, pulse 64, temperature 98.4 F (36.9 C), temperature source Oral, weight 236 lb 9.6 oz (107.3 kg).Body mass index is 31.22 kg/m.  General Appearance: Casual  Eye Contact:  Fair  Speech:  Clear and Coherent  Volume:  Normal  Mood:  Anxious  Affect:  Congruent  Thought Process:  Goal Directed and Descriptions of Associations: Intact  Orientation:  Full (Time, Place, and Person)  Thought Content: Logical   Suicidal Thoughts:  No  Homicidal Thoughts:  No  Memory:  Immediate;   Fair Recent;   Fair Remote;   Fair  Judgement:  Fair  Insight:  Fair  Psychomotor Activity:  Normal  Concentration:  Concentration: Fair and Attention Span: Fair  Recall:  [employer] of Knowledge: Fair  Language: Fair  Akathisia:  No  Handed:  Right  AIMS (if indicated): 0  Assets:  Communication Skills Desire for Improvement Housing Social Support   ADL's:  Intact  Cognition: WNL  Sleep:  Fair   Screenings: PHQ2-9     Clinical Support from 10/11/2017 in [facility] Visit from 03/12/2017 in [facility] Visit from 02/02/2016 in [facility]  PHQ-2 Total Score  6  1  1   PHQ-9 Total Score  24  -  -       Assessment and Plan: Kenneth Pruitt is a 67 year old Caucasian male who has a history of depression, anxiety, multiple medical problems including essential hypertension, coronary artery disease, status post stent placement, hyperlipidemia, BPH, presented to the clinic today for a follow-up visit.  Patient reports he is currently doing well on the current medication regimen.  He reports sleep is improved and he continues to be compliant with CPAP.  Plan as noted below.  Plan MDD Continue Lexapro 15 mg p.o. daily Continue Abilify 2 mg p.o. nightly Continue CBT with [name]  GAD  Lexapro 15 mg p.o. nightly Continue hydroxyzine 25 mg p.o. twice daily as needed for severe anxiety attack  Insomnia Patient is compliant on his CPAP.  He has OSA.  Follow-up in clinic in 6-8 weeks or sooner if needed.  More than 50 % of the time was spent for psychoeducation and supportive psychotherapy and care coordination.  This note was generated in part or whole with voice recognition software. Voice recognition is usually quite accurate but there are transcription errors that can and very often do occur. I apologize for any typographical errors that were not detected and corrected.         Ursula Alert, MD 02/12/2018, 1:01 PM

## 2018-02-13 ENCOUNTER — Ambulatory Visit: Payer: Medicare Other | Admitting: Licensed Clinical Social Worker

## 2018-02-20 ENCOUNTER — Ambulatory Visit (INDEPENDENT_AMBULATORY_CARE_PROVIDER_SITE_OTHER): Payer: Medicare Other | Admitting: Family Medicine

## 2018-02-20 ENCOUNTER — Encounter: Payer: Self-pay | Admitting: Family Medicine

## 2018-02-20 DIAGNOSIS — I10 Essential (primary) hypertension: Secondary | ICD-10-CM

## 2018-02-20 DIAGNOSIS — M1710 Unilateral primary osteoarthritis, unspecified knee: Secondary | ICD-10-CM

## 2018-02-20 MED ORDER — HYDROCHLOROTHIAZIDE 25 MG PO TABS: 25.0000 mg | ORAL_TABLET | Freq: Every day | ORAL | 3 refills | Status: DC

## 2018-02-20 NOTE — Assessment & Plan Note (Signed)
Also reviewed patient's knee arthritis getting worse is looking at joint replacement surgery may be this winter.

## 2018-02-20 NOTE — Progress Notes (Signed)
   BP (!) 178/84   Pulse 65   Wt 235 lb (106.6 kg)   SpO2 98%   BMI 31.00 kg/m    Subjective:    Patient ID: Kenneth Pruitt, male    DOB: 1951-06-16, 67 y.o.   MRN: [id number]  HPI: Kenneth Pruitt is a 67 y.o. male  Chief Complaint  Patient presents with  . Follow-up  . Hypertension  Patient reviewed that has new diagnosis of sleep apnea and CPAP which is made a big difference in how he feels and energy level. Patient not taking hydroxyzine or thiazide.  Blood pressures been markedly elevated. Is very stressful life with trying to take care of his mother who is in a nursing home.  Relevant past medical, surgical, family and social history reviewed and updated as indicated. Interim medical history since our last visit reviewed. Allergies and medications reviewed and updated.  Review of Systems  Constitutional: Negative.   Respiratory: Negative.   Cardiovascular: Negative.     Per HPI unless specifically indicated above     Objective:    BP (!) 178/84   Pulse 65   Wt 235 lb (106.6 kg)   SpO2 98%   BMI 31.00 kg/m   Wt Readings from Last 3 Encounters:  02/20/18 235 lb (106.6 kg)  12/11/17 237 lb (107.5 kg)  12/11/17 236 lb 1.6 oz (107.1 kg)    Physical Exam  Constitutional: He is oriented to person, place, and time. He appears well-developed and well-nourished.  HENT:  Head: Normocephalic and atraumatic.  Eyes: Conjunctivae and EOM are normal.  Neck: Normal range of motion.  Cardiovascular: Normal rate, regular rhythm and normal heart sounds.  Pulmonary/Chest: Effort normal and breath sounds normal.  Musculoskeletal: Normal range of motion.  Neurological: He is alert and oriented to person, place, and time.  Skin: No erythema.  Psychiatric: He has a normal mood and affect. His behavior is normal. Judgment and thought content normal.    Results for orders placed or performed in visit on 01/28/18  Testosterone, free, total([facility])  Result Value Ref  Range   Testosterone 782 264 - 916 ng/dL   Testosterone, Free 15.8 6.6 - 18.1 pg/mL   Sex Hormone Binding 28.2 19.3 - 76.4 nmol/L      Assessment & Plan:   Problem List Items Addressed This Visit      Cardiovascular and Mediastinum   Essential hypertension    Poor control will re-add hydrochlorothiazide 25 mg stay off hydroxyzine continue amlodipine losartan 100 mg. Recheck blood pressure BMP 1 month      Relevant Medications   hydrochlorothiazide (HYDRODIURIL) 25 MG tablet     Musculoskeletal and Integument   Arthritis of knee    Also reviewed patient's knee arthritis getting worse is looking at joint replacement surgery may be this winter.          Follow up plan: Return in about 1 month (around 03/20/2018) for BMP.

## 2018-02-20 NOTE — Assessment & Plan Note (Signed)
Poor control will re-add hydrochlorothiazide 25 mg stay off hydroxyzine continue amlodipine losartan 100 mg. Recheck blood pressure BMP 1 month

## 2018-02-27 ENCOUNTER — Ambulatory Visit: Payer: Medicare Other | Admitting: Licensed Clinical Social Worker

## 2018-02-27 DIAGNOSIS — F411 Generalized anxiety disorder: Secondary | ICD-10-CM

## 2018-02-27 DIAGNOSIS — F330 Major depressive disorder, recurrent, mild: Secondary | ICD-10-CM | POA: Diagnosis not present

## 2018-02-27 DIAGNOSIS — F5105 Insomnia due to other mental disorder: Secondary | ICD-10-CM | POA: Diagnosis not present

## 2018-03-03 NOTE — Progress Notes (Signed)
   THERAPIST PROGRESS NOTE  Session Time: 63mn  Participation Level: Active  Behavioral Response: CasualAlertEuthymic  Type of Therapy: Individual Therapy  Treatment Goals addressed: Anxiety and Coping  Interventions: CBT and Motivational Interviewing  Summary: JSHELLIE GOETTLis a 67y.o. male who presents with a reduction in smptoms of his diagnosis.   Therapist met with Patient in an outpatient setting to assess current mood and assist with making progress towards goals through the use of therapeutic intervention. Therapist did a brief mood check, assessing anger, fear, disgust, excitement, happiness, and sadness.  Therapist assisted Patient with venting and being able to process his thoughts are continue to resurface.  Patient reports a difficult and hard childhood.  Patient reports that his parents were rarely available to him.  He reports having trauma surrounding childhood.  Discussion of explaining to his family what occurred so he can begin to heal from the past.  Stressed the importance of mood stabilization through learned coping skills and medication management.  Encouraged Patient to focus on her strengths and the positive aspects.  Suicidal/Homicidal: No  Plan: Return again in 2 weeks.  Diagnosis: Axis I: Depressive Disorder NOS and Generalized Anxiety Disorder    Axis II: No diagnosis    NLubertha South LCSW 02/27/2018

## 2018-03-10 NOTE — Progress Notes (Signed)
[place]  [phone] [phone]  ID: [name] OB: 1951/09/05  MR#: [id number]  ENI#:[id number]  Patient Care Team: Guadalupe Maple, MD as PCP - General (Family Medicine) Guadalupe Maple, MD as PCP - Family Medicine (Family Medicine) Brendolyn Patty, MD (Dermatology) Earnestine Leys, MD (Specialist) Dionisio David, MD as Consulting Physician (Cardiology) Troxler, Adele Schilder as Attending Physician (Podiatry)  CHIEF COMPLAINT: Secondary polycythemia.  INTERVAL HISTORY: Patient returns to clinic today for repeat laboratory work, further evaluation, and consideration of phlebotomy.  He continues to feel well and remains asymptomatic.  He complains of knee pain and states he might need a joint replacement in the near future.  He denies any weakness or fatigue.  He continues to be very active and states he rode his bike 45 miles yesterday.  He has no neurologic complaints. Patient denies any recent fevers or illnesses. He has a good appetite and denies weight loss. He has no chest pain or shortness of breath. He denies any nausea, vomiting, constipation, or diarrhea. He has no urinary complaints.  Patient offers no specific complaints today.  REVIEW OF SYSTEMS:   Review of Systems  Constitutional: Negative.  Negative for fever, malaise/fatigue and weight loss.  Respiratory: Negative.  Negative for cough and shortness of breath.   Cardiovascular: Negative.  Negative for chest pain and leg swelling.  Gastrointestinal: Negative.  Negative for abdominal pain, blood in stool and melena.  Genitourinary: Negative.  Negative for dysuria.  Musculoskeletal: Positive for joint pain. Negative for back pain.  Skin: Negative.  Negative for rash.  Neurological: Negative.  Negative for sensory change, focal weakness and weakness.  Psychiatric/Behavioral: Negative.  The patient is not nervous/anxious.     As per HPI. Otherwise, a complete review of systems  is negative.  PAST MEDICAL HISTORY: Past Medical History:  Diagnosis Date  . Coronary artery disease   . Depression   . Hyperlipidemia     PAST SURGICAL HISTORY: Past Surgical History:  Procedure Laterality Date  . CARDIAC CATHETERIZATION N/A 04/26/2015   Procedure: Left Heart Cath;  Surgeon: Dionisio David, MD;  Location: [facility];  Service: Cardiovascular;  Laterality: N/A;  . CORONARY ANGIOPLASTY    . EYE SURGERY Bilateral    cataract    FAMILY HISTORY: Family History  Problem Relation Age of Onset  . Diabetes Mother   . Hypertension Mother   . Cancer Father   . Depression Father   . Anxiety disorder Father   . Depression Daughter     ADVANCED DIRECTIVES (Y/N):  N  HEALTH MAINTENANCE: Social History   Tobacco Use  . Smoking status: Former Smoker    Years: 30.00    Types: Cigars    Last attempt to quit: 07/18/2006    Years since quitting: 11.6  . Smokeless tobacco: Never Used  Substance Use Topics  . Alcohol use: No  . Drug use: No     Colonoscopy:  PAP:  Bone density:  Lipid panel:  Allergies  Allergen Reactions  . Clonidine Derivatives Shortness Of Breath    Low heart rate    Current Outpatient Medications  Medication Sig Dispense Refill  . amLODipine (NORVASC) 10 MG tablet Take 1 tablet (10 mg total) by mouth daily. 90 tablet 4  . ARIPiprazole (ABILIFY) 2 MG tablet TAKE 1 TABLET BY MOUTH AT BEDTIME FOR MOOD SYMPTOMS 30 tablet 1  . Cholecalciferol ([employer] VIT D 5000 HIGH-POTENCY) 5000 units capsule Take 5,000 Units by  mouth daily.    . clopidogrel (PLAVIX) 75 MG tablet Take 75 mg by mouth daily.    Marland Kitchen dutasteride (AVODART) 0.5 MG capsule Take 1 capsule (0.5 mg total) by mouth daily. 90 capsule 4  . escitalopram (LEXAPRO) 10 MG tablet Take 1.5 tablets (15 mg total) by mouth at bedtime. 45 tablet 1  . fluticasone (FLONASE) 50 MCG/ACT nasal spray USE 2 SPRAYS IN EACH NOSTRIL DAILY 16 g 6  . hydrochlorothiazide (HYDRODIURIL) 25 MG tablet Take 1  tablet (25 mg total) by mouth daily. 90 tablet 3  . hydrOXYzine (VISTARIL) 25 MG capsule Take 1 capsule (25 mg total) by mouth 2 (two) times daily as needed for anxiety (only for severe anxiety sx and also for sleep). 60 capsule 1  . losartan (COZAAR) 100 MG tablet Take 1 tablet (100 mg total) by mouth daily. 90 tablet 4  . meloxicam (MOBIC) 15 MG tablet Take 1 tablet (15 mg total) by mouth daily. 30 tablet 6  . mupirocin ointment (BACTROBAN) 2 % APPLY AS DIRECTED 22 g 1  . niacin (NIASPAN) 1000 MG CR tablet Take 2 tablets (2,000 mg total) by mouth at bedtime. 180 tablet 4  . Omega-3 Fatty Acids (FISH OIL) 1000 MG CAPS Take 1,000 capsules by mouth.    . pantoprazole (PROTONIX) 40 MG tablet 40 mg daily.    Marland Kitchen PROCTO-MED HC 2.5 % rectal cream APPLY RECTALLY TWICE DAILY AS DIRECTED 28.35 g 1  . rosuvastatin (CRESTOR) 10 MG tablet Take 10 mg by mouth at bedtime.    . tamsulosin (FLOMAX) 0.4 MG CAPS capsule Take 1 capsule (0.4 mg total) by mouth daily. 90 capsule 4  . testosterone cypionate (DEPOTESTOSTERONE CYPIONATE) 200 MG/ML injection INJECT 0.75 ML INTRAMUSCULARLY EVERY 10 DAYS 10 mL 3  . Turmeric 450 MG CAPS Take 1 capsule by mouth daily.    . vitamin B-12 (CYANOCOBALAMIN) 1000 MCG tablet Take 1,000 mcg by mouth daily.     No current facility-administered medications for this visit.     OBJECTIVE: Vitals:   03/13/18 1110 03/13/18 1126  BP:  (!) 178/89  Pulse:  67  Resp: 16   Temp:  (!) 97.5 F (36.4 C)     Body mass index is 30.87 kg/m.    ECOG FS:0 - Asymptomatic  General: Well-developed, well-nourished, no acute distress. Eyes: Pink conjunctiva, anicteric sclera. Lungs: Clear to auscultation bilaterally. Heart: Regular rate and rhythm. No rubs, murmurs, or gallops. Abdomen: Soft, nontender, nondistended. No organomegaly noted, normoactive bowel sounds. Musculoskeletal: No edema, cyanosis, or clubbing. Neuro: Alert, answering all questions appropriately. Cranial nerves grossly  intact. Skin: No rashes or petechiae noted. Psych: Normal affect.   LAB RESULTS:  Lab Results  Component Value Date   NA 140 10/11/2017   K 3.9 10/11/2017   CL 101 10/11/2017   CO2 24 10/11/2017   GLUCOSE 99 10/11/2017   BUN 15 10/11/2017   CREATININE 0.92 10/11/2017   CALCIUM 9.2 10/11/2017   PROT 6.6 10/11/2017   ALBUMIN 4.5 10/11/2017   AST 22 10/11/2017   ALT 23 10/11/2017   ALKPHOS 53 10/11/2017   BILITOT 0.6 10/11/2017   GFRNONAA 86 10/11/2017   GFRAA 100 10/11/2017    Lab Results  Component Value Date   WBC 5.9 03/13/2018   NEUTROABS 3.7 03/13/2018   HGB 16.8 03/13/2018   HCT 49.6 03/13/2018   MCV 92.0 03/13/2018   PLT 216 03/13/2018     STUDIES: No results found.  ASSESSMENT: Secondary polycythemia  PLAN:  1. 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  Patient's hemoglobin has improved and is now 16.8. Goal hemoglobin will be approximately 17.0.  After discussion with the patient, it was decided not to pursue 500 mL phlebotomy today.  Patient expressed understanding that as long as he requires testosterone, he likely will require periodic phlebotomies.  No intervention is needed.  Return to clinic in 3 months for further evaluation and consideration of phlebotomy.   2. Low testosterone: Patient's most recent testosterone levels in May 2019 were reported as within normal limits.  Continue to treatment with testosterone per urology. 3.  Hypertension: Patient's blood pressure continues to be significantly elevated.  Continue monitoring and treatment per primary care.    Patient expressed understanding and was in agreement with this plan. He also understands that He can call clinic at any time with any questions, concerns, or complaints.    Lloyd Huger, MD   03/16/2018 10:31 AM

## 2018-03-13 ENCOUNTER — Inpatient Hospital Stay: Payer: Medicare Other | Attending: Oncology

## 2018-03-13 ENCOUNTER — Other Ambulatory Visit: Payer: Self-pay

## 2018-03-13 ENCOUNTER — Inpatient Hospital Stay: Payer: Medicare Other

## 2018-03-13 ENCOUNTER — Inpatient Hospital Stay (HOSPITAL_BASED_OUTPATIENT_CLINIC_OR_DEPARTMENT_OTHER): Payer: Medicare Other | Admitting: Oncology

## 2018-03-13 ENCOUNTER — Encounter: Payer: Self-pay | Admitting: Oncology

## 2018-03-13 VITALS — BP 178/89 | HR 67 | Temp 97.5°F | Resp 16 | Ht 73.0 in | Wt 234.0 lb

## 2018-03-13 DIAGNOSIS — I10 Essential (primary) hypertension: Secondary | ICD-10-CM | POA: Diagnosis not present

## 2018-03-13 DIAGNOSIS — D751 Secondary polycythemia: Secondary | ICD-10-CM

## 2018-03-13 DIAGNOSIS — E291 Testicular hypofunction: Secondary | ICD-10-CM | POA: Diagnosis not present

## 2018-03-13 LAB — CBC WITH DIFFERENTIAL/PLATELET
Basophils Absolute: 0 10*3/uL (ref 0–0.1)
Basophils Relative: 1 %
EOS PCT: 1 %
Eosinophils Absolute: 0.1 10*3/uL (ref 0–0.7)
HCT: 49.6 % (ref 40.0–52.0)
Hemoglobin: 16.8 g/dL (ref 13.0–18.0)
Lymphocytes Relative: 26 %
Lymphs Abs: 1.5 10*3/uL (ref 1.0–3.6)
MCH: 31.1 pg (ref 26.0–34.0)
MCHC: 33.8 g/dL (ref 32.0–36.0)
MCV: 92 fL (ref 80.0–100.0)
MONOS PCT: 10 %
Monocytes Absolute: 0.6 10*3/uL (ref 0.2–1.0)
NEUTROS PCT: 62 %
Neutro Abs: 3.7 10*3/uL (ref 1.4–6.5)
PLATELETS: 216 10*3/uL (ref 150–440)
RBC: 5.39 MIL/uL (ref 4.40–5.90)
RDW: 13.6 % (ref 11.5–14.5)
WBC: 5.9 10*3/uL (ref 3.8–10.6)

## 2018-03-13 NOTE — Progress Notes (Signed)
Patient here for follow up. No changes since his last appt. 

## 2018-03-17 NOTE — Progress Notes (Signed)
   THERAPIST PROGRESS NOTE  Session Time: 20min  Participation Level: Active  Behavioral Response: CasualAlertEuthymic  Type of Therapy: Individual Therapy  Treatment Goals addressed: Coping  Interventions: CBT and Motivational Interviewing  Summary: Kenneth Pruitt is a 67 y.o. male who presents with continued symptoms of his diagnosis.  Explored coping skills and triggers.   Suicidal/Homicidal: No   Plan: Return again in 2 weeks.  Diagnosis: Axis I: Depression    Axis II: No diagnosis    Lubertha South, LCSW 01/01/2018

## 2018-03-21 ENCOUNTER — Telehealth: Payer: Self-pay | Admitting: Psychiatry

## 2018-03-21 DIAGNOSIS — F330 Major depressive disorder, recurrent, mild: Secondary | ICD-10-CM

## 2018-03-21 DIAGNOSIS — F411 Generalized anxiety disorder: Secondary | ICD-10-CM

## 2018-03-21 MED ORDER — ARIPIPRAZOLE 2 MG PO TABS: ORAL_TABLET | ORAL | 2 refills | Status: DC

## 2018-03-21 MED ORDER — ESCITALOPRAM OXALATE 10 MG PO TABS: 15.0000 mg | ORAL_TABLET | Freq: Every day | ORAL | 2 refills | Status: DC

## 2018-03-21 NOTE — Telephone Encounter (Signed)
lexapro and abilify sent to pharmacy since writer will not be available in July.

## 2018-03-24 ENCOUNTER — Other Ambulatory Visit: Payer: Self-pay | Admitting: Cardiovascular Disease

## 2018-03-24 DIAGNOSIS — I200 Unstable angina: Secondary | ICD-10-CM | POA: Insufficient documentation

## 2018-03-26 ENCOUNTER — Encounter: Payer: Self-pay | Admitting: Family Medicine

## 2018-03-26 ENCOUNTER — Ambulatory Visit (INDEPENDENT_AMBULATORY_CARE_PROVIDER_SITE_OTHER): Payer: Medicare Other | Admitting: Family Medicine

## 2018-03-26 VITALS — BP 168/82 | HR 63 | Ht 74.0 in | Wt 238.5 lb

## 2018-03-26 DIAGNOSIS — Z1211 Encounter for screening for malignant neoplasm of colon: Secondary | ICD-10-CM | POA: Diagnosis not present

## 2018-03-26 DIAGNOSIS — I200 Unstable angina: Secondary | ICD-10-CM | POA: Diagnosis not present

## 2018-03-26 DIAGNOSIS — M25561 Pain in right knee: Secondary | ICD-10-CM

## 2018-03-26 DIAGNOSIS — I10 Essential (primary) hypertension: Secondary | ICD-10-CM

## 2018-03-26 DIAGNOSIS — G8929 Other chronic pain: Secondary | ICD-10-CM

## 2018-03-26 NOTE — Progress Notes (Signed)
BP (!) 168/82   Pulse 63   Ht 6\' 2"  (1.88 m)   Wt 238 lb 8 oz (108.2 kg)   SpO2 98%   BMI 30.62 kg/m    Subjective:    Patient ID: Kenneth Pruitt, male    DOB: 05-21-1951, 67 y.o.   MRN: [id number]  HPI: Kenneth Pruitt is a 67 y.o. male  Chief Complaint  Patient presents with  . Follow-up  . Hypertension    Patient had nuclear stress test x 3 weeks ago, came back abnormal. Pt had CTA of heart Monday come back abnormal. Scheduled for Heart Cath at 8:30 in the morning  Patient otherwise doing well except for elevated blood pressure.  A great deal of stress anxiety with mother and nursing home. Working with psychiatry on depression and nerves taking faithfully without problems Blood pressure has been elevated concerned may be related to cardiac condition is scheduled for further evaluation tomorrow morning as noted above  Relevant past medical, surgical, family and social history reviewed and updated as indicated. Interim medical history since our last visit reviewed. Allergies and medications reviewed and updated.  Review of Systems  Constitutional: Negative.   Respiratory: Negative.   Cardiovascular: Negative.     Per HPI unless specifically indicated above     Objective:    BP (!) 168/82   Pulse 63   Ht 6\' 2"  (1.88 m)   Wt 238 lb 8 oz (108.2 kg)   SpO2 98%   BMI 30.62 kg/m   Wt Readings from Last 3 Encounters:  03/26/18 238 lb 8 oz (108.2 kg)  03/13/18 234 lb (106.1 kg)  02/20/18 235 lb (106.6 kg)    Physical Exam  Constitutional: He is oriented to person, place, and time. He appears well-developed and well-nourished.  HENT:  Head: Normocephalic and atraumatic.  Eyes: Conjunctivae and EOM are normal.  Neck: Normal range of motion.  Cardiovascular: Normal rate, regular rhythm and normal heart sounds.  Pulmonary/Chest: Effort normal and breath sounds normal.  Musculoskeletal: Normal range of motion.  Neurological: He is alert and oriented to person, place,  and time.  Skin: No erythema.  Psychiatric: He has a normal mood and affect. His behavior is normal. Judgment and thought content normal.    Results for orders placed or performed in visit on 03/13/18  CBC with Differential/Platelet  Result Value Ref Range   WBC 5.9 3.8 - 10.6 K/uL   RBC 5.39 4.40 - 5.90 MIL/uL   Hemoglobin 16.8 13.0 - 18.0 g/dL   HCT 49.6 40.0 - 52.0 %   MCV 92.0 80.0 - 100.0 fL   MCH 31.1 26.0 - 34.0 pg   MCHC 33.8 32.0 - 36.0 g/dL   RDW 13.6 11.5 - 14.5 %   Platelets 216 150 - 440 K/uL   Neutrophils Relative % 62 %   Neutro Abs 3.7 1.4 - 6.5 K/uL   Lymphocytes Relative 26 %   Lymphs Abs 1.5 1.0 - 3.6 K/uL   Monocytes Relative 10 %   Monocytes Absolute 0.6 0.2 - 1.0 K/uL   Eosinophils Relative 1 %   Eosinophils Absolute 0.1 0 - 0.7 K/uL   Basophils Relative 1 %   Basophils Absolute 0.0 0 - 0.1 K/uL      Assessment & Plan:   Problem List Items Addressed This Visit      Cardiovascular and Mediastinum   Essential hypertension - Primary    Poor control if remains in poor control after cardiac  cath refer to nephrology to further evaluate.      Relevant Orders   Basic metabolic panel   Unstable angina [facility])    Cardiac cath in the morning         Other   Knee pain, right    Contributing to stress and having knee replacement surgeries this fall       Other Visit Diagnoses    Colon cancer screening           Follow up plan: Return if symptoms worsen or fail to improve, for As scheduled.

## 2018-03-26 NOTE — Assessment & Plan Note (Signed)
Cardiac cath in the morning

## 2018-03-26 NOTE — Assessment & Plan Note (Signed)
Poor control if remains in poor control after cardiac cath refer to nephrology to further evaluate.

## 2018-03-26 NOTE — Assessment & Plan Note (Signed)
Contributing to stress and having knee replacement surgeries this fall

## 2018-03-27 ENCOUNTER — Other Ambulatory Visit: Payer: Self-pay

## 2018-03-27 ENCOUNTER — Encounter: Payer: Self-pay | Admitting: Family Medicine

## 2018-03-27 ENCOUNTER — Inpatient Hospital Stay
Admission: AD | Admit: 2018-03-27 | Discharge: 2018-03-28 | DRG: 247 | Disposition: A | Discharge status: Home / Self Care | Payer: Medicare Other | Attending: Internal Medicine | Admitting: Internal Medicine

## 2018-03-27 ENCOUNTER — Encounter
Admission: AD | Disposition: A | Discharge status: Home / Self Care | Payer: Self-pay | Source: Home / Self Care | Attending: Internal Medicine

## 2018-03-27 DIAGNOSIS — I10 Essential (primary) hypertension: Secondary | ICD-10-CM | POA: Diagnosis not present

## 2018-03-27 DIAGNOSIS — Z79890 Hormone replacement therapy (postmenopausal): Secondary | ICD-10-CM | POA: Diagnosis not present

## 2018-03-27 DIAGNOSIS — N401 Enlarged prostate with lower urinary tract symptoms: Secondary | ICD-10-CM

## 2018-03-27 DIAGNOSIS — Z791 Long term (current) use of non-steroidal anti-inflammatories (NSAID): Secondary | ICD-10-CM | POA: Diagnosis not present

## 2018-03-27 DIAGNOSIS — Z955 Presence of coronary angioplasty implant and graft: Secondary | ICD-10-CM

## 2018-03-27 DIAGNOSIS — Z833 Family history of diabetes mellitus: Secondary | ICD-10-CM | POA: Diagnosis not present

## 2018-03-27 DIAGNOSIS — F329 Major depressive disorder, single episode, unspecified: Secondary | ICD-10-CM | POA: Diagnosis not present

## 2018-03-27 DIAGNOSIS — Z87891 Personal history of nicotine dependence: Secondary | ICD-10-CM | POA: Diagnosis not present

## 2018-03-27 DIAGNOSIS — I2510 Atherosclerotic heart disease of native coronary artery without angina pectoris: Secondary | ICD-10-CM | POA: Diagnosis present

## 2018-03-27 DIAGNOSIS — I200 Unstable angina: Secondary | ICD-10-CM

## 2018-03-27 DIAGNOSIS — R350 Frequency of micturition: Secondary | ICD-10-CM

## 2018-03-27 DIAGNOSIS — I25110 Atherosclerotic heart disease of native coronary artery with unstable angina pectoris: Secondary | ICD-10-CM | POA: Diagnosis present

## 2018-03-27 DIAGNOSIS — Z8249 Family history of ischemic heart disease and other diseases of the circulatory system: Secondary | ICD-10-CM | POA: Diagnosis not present

## 2018-03-27 DIAGNOSIS — E782 Mixed hyperlipidemia: Secondary | ICD-10-CM

## 2018-03-27 DIAGNOSIS — Z7902 Long term (current) use of antithrombotics/antiplatelets: Secondary | ICD-10-CM

## 2018-03-27 DIAGNOSIS — E785 Hyperlipidemia, unspecified: Secondary | ICD-10-CM | POA: Diagnosis present

## 2018-03-27 HISTORY — PX: LEFT HEART CATH AND CORONARY ANGIOGRAPHY: CATH118249

## 2018-03-27 HISTORY — PX: CORONARY STENT INTERVENTION: CATH118234

## 2018-03-27 LAB — BASIC METABOLIC PANEL
BUN / CREAT RATIO: 19 (ref 10–24)
BUN: 18 mg/dL (ref 8–27)
CO2: 22 mmol/L (ref 20–29)
CREATININE: 0.93 mg/dL (ref 0.76–1.27)
Calcium: 9.5 mg/dL (ref 8.6–10.2)
Chloride: 102 mmol/L (ref 96–106)
GFR, EST AFRICAN AMERICAN: 98 mL/min/{1.73_m2} (ref >59)
GFR, EST NON AFRICAN AMERICAN: 85 mL/min/{1.73_m2} (ref >59)
Glucose: 118 mg/dL — ABNORMAL HIGH (ref 65–99)
POTASSIUM: 3.9 mmol/L (ref 3.5–5.2)
Sodium: 142 mmol/L (ref 134–144)

## 2018-03-27 LAB — CREATININE, SERUM
CREATININE: 0.99 mg/dL (ref 0.61–1.24)
GFR calc Af Amer: >60 mL/min (ref >60)

## 2018-03-27 LAB — CBC
HEMATOCRIT: 49.7 % (ref 40.0–52.0)
HEMATOCRIT: 50.6 % (ref 40.0–52.0)
HEMOGLOBIN: 16.8 g/dL (ref 13.0–18.0)
HEMOGLOBIN: 16.9 g/dL (ref 13.0–18.0)
MCH: 30.7 pg (ref 26.0–34.0)
MCH: 30.5 pg (ref 26.0–34.0)
MCHC: 33.8 g/dL (ref 32.0–36.0)
MCHC: 33.4 g/dL (ref 32.0–36.0)
MCV: 90.8 fL (ref 80.0–100.0)
MCV: 91.3 fL (ref 80.0–100.0)
Platelets: 176 10*3/uL (ref 150–440)
Platelets: 184 10*3/uL (ref 150–440)
RBC: 5.47 MIL/uL (ref 4.40–5.90)
RBC: 5.54 MIL/uL (ref 4.40–5.90)
RDW: 13.5 % (ref 11.5–14.5)
RDW: 13.9 % (ref 11.5–14.5)
WBC: 8.2 10*3/uL (ref 3.8–10.6)
WBC: 8.3 10*3/uL (ref 3.8–10.6)

## 2018-03-27 LAB — POCT ACTIVATED CLOTTING TIME: Activated Clotting Time: 395 seconds

## 2018-03-27 SURGERY — LEFT HEART CATH AND CORONARY ANGIOGRAPHY
Anesthesia: Moderate Sedation

## 2018-03-27 MED ORDER — TAMSULOSIN HCL 0.4 MG PO CAPS
0.4000 mg | ORAL_CAPSULE | Freq: Every day | ORAL | Status: DC
  Administered 2018-03-28: 0.4 mg via ORAL
  Filled 2018-03-27: qty 1

## 2018-03-27 MED ORDER — CLOPIDOGREL BISULFATE 75 MG PO TABS
ORAL_TABLET | ORAL | Status: DC | PRN
  Administered 2018-03-27: 225 mg via ORAL

## 2018-03-27 MED ORDER — ACETAMINOPHEN 650 MG RE SUPP: 650.0000 mg | Freq: Four times a day (QID) | RECTAL | Status: DC | PRN

## 2018-03-27 MED ORDER — ASPIRIN 81 MG PO CHEW
CHEWABLE_TABLET | ORAL | Status: DC | PRN
  Administered 2018-03-27: 243 mg via ORAL

## 2018-03-27 MED ORDER — ASPIRIN 81 MG PO CHEW
CHEWABLE_TABLET | ORAL | Status: AC
  Filled 2018-03-27: qty 1

## 2018-03-27 MED ORDER — MIDAZOLAM HCL 2 MG/2ML IJ SOLN
INTRAMUSCULAR | Status: DC | PRN
  Administered 2018-03-27: 1 mg via INTRAVENOUS

## 2018-03-27 MED ORDER — ASPIRIN 81 MG PO CHEW
CHEWABLE_TABLET | ORAL | Status: AC
  Filled 2018-03-27: qty 3

## 2018-03-27 MED ORDER — DUTASTERIDE 0.5 MG PO CAPS
0.5000 mg | ORAL_CAPSULE | Freq: Every evening | ORAL | Status: DC
  Administered 2018-03-27: 0.5 mg via ORAL
  Filled 2018-03-27 (×2): qty 1

## 2018-03-27 MED ORDER — FENTANYL CITRATE (PF) 100 MCG/2ML IJ SOLN
INTRAMUSCULAR | Status: DC | PRN
  Administered 2018-03-27: 50 ug via INTRAVENOUS
  Administered 2018-03-27: 25 ug via INTRAVENOUS

## 2018-03-27 MED ORDER — MIDAZOLAM HCL 2 MG/2ML IJ SOLN
INTRAMUSCULAR | Status: AC
  Filled 2018-03-27: qty 2

## 2018-03-27 MED ORDER — IOPAMIDOL (ISOVUE-300) INJECTION 61%
INTRAVENOUS | Status: DC | PRN
  Administered 2018-03-27: 90 mL via INTRA_ARTERIAL

## 2018-03-27 MED ORDER — SODIUM CHLORIDE 0.9 % WEIGHT BASED INFUSION: 1.0000 mL/kg/h | INTRAVENOUS | Status: DC

## 2018-03-27 MED ORDER — LABETALOL HCL 5 MG/ML IV SOLN
INTRAVENOUS | Status: AC
  Filled 2018-03-27: qty 4

## 2018-03-27 MED ORDER — ONDANSETRON HCL 4 MG/2ML IJ SOLN: 4.0000 mg | Freq: Four times a day (QID) | INTRAMUSCULAR | Status: DC | PRN

## 2018-03-27 MED ORDER — HYDROCHLOROTHIAZIDE 25 MG PO TABS
25.0000 mg | ORAL_TABLET | Freq: Every day | ORAL | Status: DC
  Administered 2018-03-28: 25 mg via ORAL
  Filled 2018-03-27: qty 1

## 2018-03-27 MED ORDER — LOSARTAN POTASSIUM 50 MG PO TABS
100.0000 mg | ORAL_TABLET | Freq: Every evening | ORAL | Status: DC
  Administered 2018-03-27: 100 mg via ORAL
  Filled 2018-03-27: qty 2

## 2018-03-27 MED ORDER — HYDRALAZINE HCL 25 MG PO TABS
25.0000 mg | ORAL_TABLET | Freq: Every day | ORAL | Status: DC
  Administered 2018-03-28: 25 mg via ORAL
  Filled 2018-03-27: qty 1

## 2018-03-27 MED ORDER — LABETALOL HCL 5 MG/ML IV SOLN
INTRAVENOUS | Status: DC | PRN
  Administered 2018-03-27: 10 mg via INTRAVENOUS

## 2018-03-27 MED ORDER — NITROGLYCERIN 5 MG/ML IV SOLN
INTRAVENOUS | Status: AC
  Filled 2018-03-27: qty 10

## 2018-03-27 MED ORDER — BIVALIRUDIN BOLUS VIA INFUSION - CUPID
INTRAVENOUS | Status: DC | PRN
  Administered 2018-03-27: 81 mg via INTRAVENOUS

## 2018-03-27 MED ORDER — ENOXAPARIN SODIUM 40 MG/0.4ML ~~LOC~~ SOLN
40.0000 mg | SUBCUTANEOUS | Status: DC
  Administered 2018-03-27: 40 mg via SUBCUTANEOUS
  Filled 2018-03-27: qty 0.4

## 2018-03-27 MED ORDER — ACETAMINOPHEN 325 MG PO TABS
650.0000 mg | ORAL_TABLET | Freq: Four times a day (QID) | ORAL | Status: DC | PRN
Start: 2018-03-27 — End: 2018-03-28

## 2018-03-27 MED ORDER — PANTOPRAZOLE SODIUM 20 MG PO TBEC
40.0000 mg | DELAYED_RELEASE_TABLET | Freq: Every day | ORAL | Status: DC
  Administered 2018-03-28: 40 mg via ORAL
  Filled 2018-03-27: qty 2

## 2018-03-27 MED ORDER — IOPAMIDOL (ISOVUE-300) INJECTION 61%
INTRAVENOUS | Status: DC | PRN
  Administered 2018-03-27: 115 mL via INTRA_ARTERIAL

## 2018-03-27 MED ORDER — VITAMIN B-12 1000 MCG PO TABS
1000.0000 ug | ORAL_TABLET | Freq: Every day | ORAL | Status: DC
  Administered 2018-03-28: 1000 ug via ORAL
  Filled 2018-03-27: qty 1

## 2018-03-27 MED ORDER — CLOPIDOGREL BISULFATE 75 MG PO TABS
ORAL_TABLET | ORAL | Status: AC
  Filled 2018-03-27: qty 3

## 2018-03-27 MED ORDER — ROSUVASTATIN CALCIUM 10 MG PO TABS
10.0000 mg | ORAL_TABLET | Freq: Every evening | ORAL | Status: DC
  Administered 2018-03-27: 10 mg via ORAL
  Filled 2018-03-27 (×2): qty 1

## 2018-03-27 MED ORDER — HEPARIN (PORCINE) IN NACL 1000-0.9 UT/500ML-% IV SOLN
INTRAVENOUS | Status: AC
  Filled 2018-03-27: qty 500

## 2018-03-27 MED ORDER — SODIUM CHLORIDE 0.9% FLUSH: 3.0000 mL | INTRAVENOUS | Status: DC | PRN

## 2018-03-27 MED ORDER — FLUTICASONE PROPIONATE 50 MCG/ACT NA SUSP
2.0000 | Freq: Every day | NASAL | Status: DC
  Filled 2018-03-27: qty 16

## 2018-03-27 MED ORDER — SODIUM CHLORIDE 0.9% FLUSH: 3.0000 mL | Freq: Two times a day (BID) | INTRAVENOUS | Status: DC

## 2018-03-27 MED ORDER — SODIUM CHLORIDE 0.9 % IV SOLN: 250.0000 mL | INTRAVENOUS | Status: DC | PRN

## 2018-03-27 MED ORDER — LORAZEPAM 2 MG PO TABS: 2.0000 mg | ORAL_TABLET | Freq: Once | ORAL | Status: DC

## 2018-03-27 MED ORDER — ESCITALOPRAM OXALATE 10 MG PO TABS
15.0000 mg | ORAL_TABLET | Freq: Every day | ORAL | Status: DC
  Administered 2018-03-27: 15 mg via ORAL
  Filled 2018-03-27 (×2): qty 1.5

## 2018-03-27 MED ORDER — ONDANSETRON HCL 4 MG PO TABS: 4.0000 mg | ORAL_TABLET | Freq: Four times a day (QID) | ORAL | Status: DC | PRN

## 2018-03-27 MED ORDER — SODIUM CHLORIDE 0.9 % WEIGHT BASED INFUSION
3.0000 mL/kg/h | INTRAVENOUS | Status: AC
  Administered 2018-03-27: 3 mL/kg/h via INTRAVENOUS

## 2018-03-27 MED ORDER — OMEGA-3-ACID ETHYL ESTERS 1 G PO CAPS
1.0000 g | ORAL_CAPSULE | Freq: Every day | ORAL | Status: DC
  Administered 2018-03-28: 1 g via ORAL
  Filled 2018-03-27: qty 1

## 2018-03-27 MED ORDER — AMLODIPINE BESYLATE 5 MG PO TABS
10.0000 mg | ORAL_TABLET | Freq: Every day | ORAL | Status: DC
  Administered 2018-03-28: 10 mg via ORAL
  Filled 2018-03-27: qty 2

## 2018-03-27 MED ORDER — ARIPIPRAZOLE 2 MG PO TABS
2.0000 mg | ORAL_TABLET | Freq: Every day | ORAL | Status: DC
  Filled 2018-03-27: qty 1

## 2018-03-27 MED ORDER — LIDOCAINE HCL (PF) 1 % IJ SOLN
INTRAMUSCULAR | Status: AC
  Filled 2018-03-27: qty 30

## 2018-03-27 MED ORDER — MELOXICAM 7.5 MG PO TABS
15.0000 mg | ORAL_TABLET | Freq: Every day | ORAL | Status: DC
  Administered 2018-03-28: 15 mg via ORAL
  Filled 2018-03-27: qty 2

## 2018-03-27 MED ORDER — BIVALIRUDIN TRIFLUOROACETATE 250 MG IV SOLR
INTRAVENOUS | Status: AC
  Filled 2018-03-27: qty 250

## 2018-03-27 MED ORDER — VITAMIN D3 25 MCG (1000 UNIT) PO TABS
5000.0000 [IU] | ORAL_TABLET | Freq: Every day | ORAL | Status: DC
  Administered 2018-03-28: 5000 [IU] via ORAL
  Filled 2018-03-27: qty 5

## 2018-03-27 MED ORDER — FENTANYL CITRATE (PF) 100 MCG/2ML IJ SOLN
INTRAMUSCULAR | Status: AC
  Filled 2018-03-27: qty 2

## 2018-03-27 MED ORDER — SODIUM CHLORIDE 0.9 % IV SOLN
INTRAVENOUS | Status: DC | PRN
  Administered 2018-03-27: 1.75 mg/kg/h via INTRAVENOUS

## 2018-03-27 MED ORDER — ASPIRIN 81 MG PO CHEW
81.0000 mg | CHEWABLE_TABLET | ORAL | Status: AC
  Administered 2018-03-27: 81 mg via ORAL

## 2018-03-27 MED ORDER — CLOPIDOGREL BISULFATE 75 MG PO TABS
75.0000 mg | ORAL_TABLET | Freq: Every day | ORAL | Status: DC
  Administered 2018-03-28: 75 mg via ORAL
  Filled 2018-03-27: qty 1

## 2018-03-27 MED ORDER — NIACIN ER (ANTIHYPERLIPIDEMIC) 500 MG PO TBCR
2000.0000 mg | EXTENDED_RELEASE_TABLET | Freq: Every evening | ORAL | Status: DC
  Administered 2018-03-27: 2000 mg via ORAL
  Filled 2018-03-27 (×3): qty 4

## 2018-03-27 SURGICAL SUPPLY — 16 items
BALLN TREK RX 2.5X12 (BALLOONS) ×3
BALLOON TREK RX 2.5X12 (BALLOONS) ×2 IMPLANT
CATH INFINITI 5FR ANG PIGTAIL (CATHETERS) ×3 IMPLANT
CATH INFINITI 5FR JL4 (CATHETERS) ×3 IMPLANT
CATH INFINITI JR4 5F (CATHETERS) ×3 IMPLANT
CATH VISTA GUIDE 6FR JR4 (CATHETERS) ×3 IMPLANT
DEVICE CLOSURE MYNXGRIP 6/7F (Vascular Products) ×3 IMPLANT
DEVICE INFLAT 30 PLUS (MISCELLANEOUS) ×3 IMPLANT
KIT MANI 3VAL PERCEP (MISCELLANEOUS) ×3 IMPLANT
NEEDLE PERC 18GX7CM (NEEDLE) ×3 IMPLANT
PACK CARDIAC CATH (CUSTOM PROCEDURE TRAY) ×3 IMPLANT
SHEATH AVANTI 5FR X 11CM (SHEATH) ×3 IMPLANT
SHEATH AVANTI 6FR X 11CM (SHEATH) ×3 IMPLANT
STENT SIERRA 2.50 X 18 MM (Permanent Stent) ×3 IMPLANT
WIRE G HI TQ BMW 190 (WIRE) ×3 IMPLANT
WIRE GUIDERIGHT .035X150 (WIRE) ×3 IMPLANT

## 2018-03-27 NOTE — H&P (Signed)
[facility] [place] NAME: Kenneth Pruitt    MR#:  [id number]  DATE OF BIRTH:  1951-02-27  DATE OF ADMISSION:  03/27/2018  PRIMARY CARE PHYSICIAN: Guadalupe Maple, MD   REQUESTING/REFERRING PHYSICIAN: Dr Laurelyn Sickle  CHIEF COMPLAINT:  abnormal stress test and cardiac cath HISTORY OF PRESENT ILLNESS:  Kenneth Pruitt  is a 67 y.o. male with a known history of coronary artery disease status post stent 13 years ago, hypertension, depression, hyperlipidemia is admitted status post cardiac catheterization and DES stent placement in PDA today -pt currently is chest pain free. He is seen in specials recovery. Denies shortness of breath.  PAST MEDICAL HISTORY:   Past Medical History:  Diagnosis Date  . Coronary artery disease   . Depression   . Hyperlipidemia     PAST SURGICAL HISTOIRY:   Past Surgical History:  Procedure Laterality Date  . CARDIAC CATHETERIZATION N/A 04/26/2015   Procedure: Left Heart Cath;  Surgeon: Dionisio David, MD;  Location: [place];  Service: Cardiovascular;  Laterality: N/A;  . CORONARY ANGIOPLASTY    . EYE SURGERY Bilateral    cataract    SOCIAL HISTORY:   Social History   Tobacco Use  . Smoking status: Former Smoker    Years: 30.00    Types: Cigars    Last attempt to quit: 07/18/2006    Years since quitting: 11.6  . Smokeless tobacco: Never Used  Substance Use Topics  . Alcohol use: No    FAMILY HISTORY:   Family History  Problem Relation Age of Onset  . Diabetes Mother   . Hypertension Mother   . Cancer Father   . Depression Father   . Anxiety disorder Father   . Depression Daughter     DRUG ALLERGIES:   Allergies  Allergen Reactions  . Clonidine Derivatives Shortness Of Breath and Other (See Comments)    Low heart rate    REVIEW OF SYSTEMS:  Review of Systems  Constitutional: Negative for chills, fever and weight loss.  HENT: Negative for ear discharge, ear pain and  nosebleeds.   Eyes: Negative for blurred vision, pain and discharge.  Respiratory: Negative for sputum production, shortness of breath, wheezing and stridor.   Cardiovascular: Negative for chest pain, palpitations, orthopnea and PND.  Gastrointestinal: Negative for abdominal pain, diarrhea, nausea and vomiting.  Genitourinary: Negative for frequency and urgency.  Musculoskeletal: Negative for back pain and joint pain.  Neurological: Negative for sensory change, speech change, focal weakness and weakness.  Psychiatric/Behavioral: Negative for depression and hallucinations. The patient is not nervous/anxious.      MEDICATIONS AT HOME:   Prior to Admission medications   Medication Sig Start Date End Date Taking? Authorizing Provider  amLODipine (NORVASC) 10 MG tablet Take 1 tablet (10 mg total) by mouth daily. 10/16/17  Yes Crissman, Jeannette How, MD  ARIPiprazole (ABILIFY) 2 MG tablet TAKE 1 TABLET BY MOUTH AT BEDTIME FOR MOOD SYMPTOMS Patient taking differently: Take 2 mg by mouth at bedtime.  03/21/18  Yes Ursula Alert, MD  Cholecalciferol ([employer]S VIT D 5000 HIGH-POTENCY) 5000 units capsule Take 5,000 Units by mouth daily.   Yes [provider]  clopidogrel (PLAVIX) 75 MG tablet Take 75 mg by mouth daily.   Yes [provider]  dutasteride (AVODART) 0.5 MG capsule Take 1 capsule (0.5 mg total) by mouth daily. Patient taking differently: Take 0.5 mg by mouth every evening.  10/16/17  Yes Golden Pop  A, MD  escitalopram (LEXAPRO) 10 MG tablet Take 1.5 tablets (15 mg total) by mouth at bedtime. 03/21/18  Yes Ursula Alert, MD  fluticasone (FLONASE) 50 MCG/ACT nasal spray USE 2 SPRAYS IN EACH NOSTRIL DAILY Patient taking differently: Use 1 spray in each nostril daily as needed for allergies 12/28/16  Yes Volney American, PA-C  hydrALAZINE (APRESOLINE) 25 MG tablet Take 25 mg by mouth daily.   Yes [provider]  hydrochlorothiazide (HYDRODIURIL) 25 MG tablet Take 1  tablet (25 mg total) by mouth daily. 02/20/18  Yes Crissman, Jeannette How, MD  hydrOXYzine (VISTARIL) 25 MG capsule Take 1 capsule (25 mg total) by mouth 2 (two) times daily as needed for anxiety (only for severe anxiety sx and also for sleep). 11/04/17  Yes Ursula Alert, MD  losartan (COZAAR) 100 MG tablet Take 1 tablet (100 mg total) by mouth daily. Patient taking differently: Take 100 mg by mouth every evening.  10/16/17  Yes Crissman, Jeannette How, MD  meloxicam (MOBIC) 15 MG tablet Take 1 tablet (15 mg total) by mouth daily. 10/16/17  Yes Crissman, Jeannette How, MD  mupirocin ointment (BACTROBAN) 2 % APPLY AS DIRECTED Patient taking differently: Apply topically daily as needed for irritation 10/22/16  Yes Crissman, Jeannette How, MD  niacin (NIASPAN) 1000 MG CR tablet Take 2 tablets (2,000 mg total) by mouth at bedtime. Patient taking differently: Take 2,000 mg by mouth every evening.  10/16/17  Yes Crissman, Jeannette How, MD  Omega-3 Fatty Acids (FISH OIL) 1000 MG CAPS Take 1,000 capsules by mouth daily.    Yes [provider]  pantoprazole (PROTONIX) 20 MG tablet Take 20 mg by mouth daily.  11/07/15  Yes [provider]  PROCTO-MED HC 2.5 % rectal cream APPLY RECTALLY TWICE DAILY AS DIRECTED Patient taking differently: Apply rectally twice daily as needed for irritation 11/21/17  Yes Johnson, [name], DO  rosuvastatin (CRESTOR) 10 MG tablet Take 10 mg by mouth every evening.  07/25/16  Yes [provider]  tamsulosin (FLOMAX) 0.4 MG CAPS capsule Take 1 capsule (0.4 mg total) by mouth daily. 10/16/17  Yes Crissman, Jeannette How, MD  testosterone cypionate (DEPOTESTOSTERONE CYPIONATE) 200 MG/ML injection INJECT 0.75 ML INTRAMUSCULARLY EVERY 10 DAYS Patient taking differently: INJECT 150 MG INTRAMUSCULARLY EVERY 10 DAYS 01/29/18  Yes Johnson, [name], DO  vitamin B-12 (CYANOCOBALAMIN) 1000 MCG tablet Take 1,000 mcg by mouth daily.   Yes [provider]      VITAL SIGNS:  Blood pressure 130/73, pulse  63, temperature 98.5 F (36.9 C), temperature source Oral, resp. rate 15, height 6\' 2"  (1.88 m), weight 108 kg (238 lb), SpO2 94 %.  PHYSICAL EXAMINATION:  GENERAL:  67 y.o.-year-old patient lying in the bed with no acute distress.  EYES: Pupils equal, round, reactive to light and accommodation. No scleral icterus. Extraocular muscles intact.  HEENT: Head atraumatic, normocephalic. Oropharynx and nasopharynx clear.  NECK:  Supple, no jugular venous distention. No thyroid enlargement, no tenderness.  LUNGS: Normal breath sounds bilaterally, no wheezing, rales,rhonchi or crepitation. No use of accessory muscles of respiration.  CARDIOVASCULAR: S1, S2 normal. No murmurs, rubs, or gallops.  ABDOMEN: Soft, nontender, nondistended. Bowel sounds present. No organomegaly or mass.  EXTREMITIES: No pedal edema, cyanosis, or clubbing.  NEUROLOGIC: Cranial nerves II through XII are intact. Muscle strength 5/5 in all extremities. Sensation intact. Gait not checked.  PSYCHIATRIC: The patient is alert and oriented x 3.  SKIN: No obvious rash, lesion, or ulcer.  LABORATORY PANEL:   CBC No results for input(s): WBC, HGB, HCT, PLT in the last 168 hours. ------------------------------------------------------------------------------------------------------------------  Chemistries  Recent Labs  Lab 03/26/18 1138  NA 142  K 3.9  CL 102  CO2 22  GLUCOSE 118*  BUN 18  CREATININE 0.93  CALCIUM 9.5   ------------------------------------------------------------------------------------------------------------------  Cardiac Enzymes No results for input(s): TROPONINI in the last 168 hours. ------------------------------------------------------------------------------------------------------------------  RADIOLOGY:  No results found.  EKG:    IMPRESSION AND PLAN:   Kenneth Pruitt  is a 67 y.o. male with a known history of coronary artery disease status post stent 13 years ago, hypertension,  depression, hyperlipidemia is admitted status post cardiac catheterization and DES stent placement in PDA today  1. coronary artery disease with abnormal stress test and abnormal cardiac catheterization status post drug-eluting stent placement in PDA -continue aspirin Plavix -continue hydralazine losartan amlodipine -cardiology consultation with Dr. Yancey Flemings  2. Hyperlipidemia -cont lovaza, niacin and crestor  3. HTN amlodipine, losarta and hydralazine  4. Depression Cont home meds  D/w pt and dter  All the records are reviewed and case discussed with ED provider. Management plans discussed with the patient, family and they are in agreement.  CODE STATUS: full  TOTAL TIME TAKING CARE OF THIS PATIENT: *45* minutes.    Fritzi Mandes M.D on 03/27/2018 at 2:15 PM  Between 7am to 6pm - Pager - [phone]  After 6pm go to [email or web address] - password [email or web address] [facility]  [facility]  Office  [phone]  CC: Primary care physician; Guadalupe Maple, MD

## 2018-03-27 NOTE — Consult Note (Signed)
Kenneth Pruitt is a 67 y.o. male  [id number]  Primary Cardiologist: Juniper Snyders Reason for Consultation: chest pain  HPI: 29 YOWM came for evaluation of chest pain and h/o PCI/DES in [place] in past. Stress test showed inferior ischaemia. CCTA showed lesion in branch of Diagnal but stent was fine. Branch of diagnal is tto small but has 80% lesion in PDA also.    Review of Systems: Has dizzines   Past Medical History:  Diagnosis Date  . Coronary artery disease   . Depression   . Hyperlipidemia     Medications Prior to Admission  Medication Sig Dispense Refill  . amLODipine (NORVASC) 10 MG tablet Take 1 tablet (10 mg total) by mouth daily. 90 tablet 4  . ARIPiprazole (ABILIFY) 2 MG tablet TAKE 1 TABLET BY MOUTH AT BEDTIME FOR MOOD SYMPTOMS (Patient taking differently: Take 2 mg by mouth at bedtime. ) 30 tablet 2  . Cholecalciferol ([employer] VIT D 5000 HIGH-POTENCY) 5000 units capsule Take 5,000 Units by mouth daily.    . clopidogrel (PLAVIX) 75 MG tablet Take 75 mg by mouth daily.    Marland Kitchen dutasteride (AVODART) 0.5 MG capsule Take 1 capsule (0.5 mg total) by mouth daily. (Patient taking differently: Take 0.5 mg by mouth every evening. ) 90 capsule 4  . escitalopram (LEXAPRO) 10 MG tablet Take 1.5 tablets (15 mg total) by mouth at bedtime. 45 tablet 2  . fluticasone (FLONASE) 50 MCG/ACT nasal spray USE 2 SPRAYS IN EACH NOSTRIL DAILY (Patient taking differently: Use 1 spray in each nostril daily as needed for allergies) 16 g 6  . hydrALAZINE (APRESOLINE) 25 MG tablet Take 25 mg by mouth daily.    . hydrochlorothiazide (HYDRODIURIL) 25 MG tablet Take 1 tablet (25 mg total) by mouth daily. 90 tablet 3  . hydrOXYzine (VISTARIL) 25 MG capsule Take 1 capsule (25 mg total) by mouth 2 (two) times daily as needed for anxiety (only for severe anxiety sx and also for sleep). 60 capsule 1  . losartan (COZAAR) 100 MG tablet Take 1 tablet (100 mg total) by mouth daily. (Patient taking differently: Take  100 mg by mouth every evening. ) 90 tablet 4  . meloxicam (MOBIC) 15 MG tablet Take 1 tablet (15 mg total) by mouth daily. 30 tablet 6  . mupirocin ointment (BACTROBAN) 2 % APPLY AS DIRECTED (Patient taking differently: Apply topically daily as needed for irritation) 22 g 1  . niacin (NIASPAN) 1000 MG CR tablet Take 2 tablets (2,000 mg total) by mouth at bedtime. (Patient taking differently: Take 2,000 mg by mouth every evening. ) 180 tablet 4  . Omega-3 Fatty Acids (FISH OIL) 1000 MG CAPS Take 1,000 capsules by mouth daily.     . pantoprazole (PROTONIX) 20 MG tablet Take 20 mg by mouth daily.     Marland Kitchen PROCTO-MED HC 2.5 % rectal cream APPLY RECTALLY TWICE DAILY AS DIRECTED (Patient taking differently: Apply rectally twice daily as needed for irritation) 28.35 g 1  . rosuvastatin (CRESTOR) 10 MG tablet Take 10 mg by mouth every evening.     . tamsulosin (FLOMAX) 0.4 MG CAPS capsule Take 1 capsule (0.4 mg total) by mouth daily. 90 capsule 4  . testosterone cypionate (DEPOTESTOSTERONE CYPIONATE) 200 MG/ML injection INJECT 0.75 ML INTRAMUSCULARLY EVERY 10 DAYS (Patient taking differently: INJECT 150 MG INTRAMUSCULARLY EVERY 10 DAYS) 10 mL 3  . vitamin B-12 (CYANOCOBALAMIN) 1000 MCG tablet Take 1,000 mcg by mouth daily.       Marland Kitchen  aspirin      . sodium chloride flush  3 mL Intravenous Q12H    Infusions: . sodium chloride    . sodium chloride      Allergies  Allergen Reactions  . Clonidine Derivatives Shortness Of Breath and Other (See Comments)    Low heart rate    Social History   Socioeconomic History  . Marital status: Married    Spouse name: ann  . Number of children: 2  . Years of education: Not on file  . Highest education level: Master's degree (e.g., MA, MS, MEng, MEd, MSW, MBA)  Occupational History    Comment: retired  Scientific laboratory technician  . Financial resource strain: Not hard at all  . Food insecurity:    Worry: Never true    Inability: Never true  . Transportation needs:     Medical: No    Non-medical: No  Tobacco Use  . Smoking status: Former Smoker    Years: 30.00    Types: Cigars    Last attempt to quit: 07/18/2006    Years since quitting: 11.6  . Smokeless tobacco: Never Used  Substance and Sexual Activity  . Alcohol use: No  . Drug use: No  . Sexual activity: Not Currently  Lifestyle  . Physical activity:    Days per week: 0 days    Minutes per session: 0 min  . Stress: Not at all  Relationships  . Social connections:    Talks on phone: More than three times a week    Gets together: More than three times a week    Attends religious service: More than 4 times per year    Active member of club or organization: Yes    Attends meetings of clubs or organizations: More than 4 times per year    Relationship status: Married  . Intimate partner violence:    Fear of current or ex partner: No    Emotionally abused: No    Physically abused: No    Forced sexual activity: No  Other Topics Concern  . Not on file  Social History Narrative  . Not on file    Family History  Problem Relation Age of Onset  . Diabetes Mother   . Hypertension Mother   . Cancer Father   . Depression Father   . Anxiety disorder Father   . Depression Daughter     PHYSICAL EXAM: Vitals:   03/27/18 0855 03/27/18 1031  BP: (!) 164/84   Pulse: 84   Resp: 13   Temp: 98.5 F (36.9 C)   SpO2: 97% 99%    No intake or output data in the 24 hours ending 03/27/18 1112  General:  Well appearing. No respiratory difficulty HEENT: normal Neck: supple. no JVD. Carotids 2+ bilat; no bruits. No lymphadenopathy or thryomegaly appreciated. Cor: PMI nondisplaced. Regular rate & rhythm. No rubs, gallops or murmurs. Lungs: clear Abdomen: soft, nontender, nondistended. No hepatosplenomegaly. No bruits or masses. Good bowel sounds. Extremities: no cyanosis, clubbing, rash, edema Neuro: alert & oriented x 3, cranial nerves grossly intact. moves all 4 extremities w/o difficulty.  Affect pleasant.  ECG: NSR no acute changes  Results for orders placed or performed in visit on 03/26/18 (from the past 24 hour(s))  Basic metabolic panel     Status: Abnormal   Collection Time: 03/26/18 11:38 AM  Result Value Ref Range   Glucose 118 (H) 65 - 99 mg/dL   BUN 18 8 - 27 mg/dL   Creatinine, Ser  0.93 0.76 - 1.27 mg/dL   GFR calc non Af Amer 85 >59 mL/min/1.73   GFR calc Af Amer 98 >59 mL/min/1.73   BUN/Creatinine Ratio 19 10 - 24   Sodium 142 134 - 144 mmol/L   Potassium 3.9 3.5 - 5.2 mmol/L   Chloride 102 96 - 106 mmol/L   CO2 22 20 - 29 mmol/L   Calcium 9.5 8.6 - 10.2 mg/dL   Narrative   Performed at:  [address], [place], [place]  [id number] Lab Director: Rush Farmer MD, Phone:  [phone]   No results found.   ASSESSMENT AND PLAN: High grade lesion of PDA and Branch of diagnal seen on CTA coronaries confirmed on cath and will do PCI of PDA as diagnal branch too small and ischaemia was in RCA area. Kenasia Scheller A

## 2018-03-27 NOTE — Progress Notes (Signed)
Talked to Dr. Marcille Blanco if patient can have sleeping aid, per MD he will place the order. RN will continue to monitor.

## 2018-03-27 NOTE — Progress Notes (Signed)
Patient transferred from cath lab with RN. Patient denies pain at this time, vitals stable, and right groin at level 0.

## 2018-03-28 DIAGNOSIS — I25110 Atherosclerotic heart disease of native coronary artery with unstable angina pectoris: Secondary | ICD-10-CM | POA: Diagnosis not present

## 2018-03-28 LAB — LIPID PANEL
CHOL/HDL RATIO: 3.3 ratio
CHOLESTEROL: 118 mg/dL (ref 0–200)
HDL: 36 mg/dL — AB (ref >40)
LDL Cholesterol: 45 mg/dL (ref 0–99)
Triglycerides: 186 mg/dL — ABNORMAL HIGH (ref <150)
VLDL: 37 mg/dL (ref 0–40)

## 2018-03-28 MED ORDER — MELOXICAM 15 MG PO TABS: 15.0000 mg | ORAL_TABLET | Freq: Every day | ORAL | 6 refills | Status: DC

## 2018-03-28 MED ORDER — NIACIN ER (ANTIHYPERLIPIDEMIC) 1000 MG PO TBCR: 2000.0000 mg | EXTENDED_RELEASE_TABLET | Freq: Every evening | ORAL | Status: DC

## 2018-03-28 MED ORDER — DUTASTERIDE 0.5 MG PO CAPS: 0.5000 mg | ORAL_CAPSULE | Freq: Every evening | ORAL | Status: DC

## 2018-03-28 MED ORDER — ASPIRIN 81 MG PO TBEC: 81.0000 mg | DELAYED_RELEASE_TABLET | Freq: Every day | ORAL | Status: DC

## 2018-03-28 MED ORDER — LOSARTAN POTASSIUM 100 MG PO TABS: 100.0000 mg | ORAL_TABLET | Freq: Every evening | ORAL | Status: DC

## 2018-03-28 MED ORDER — ROSUVASTATIN CALCIUM 20 MG PO TABS: 20.0000 mg | ORAL_TABLET | Freq: Every evening | ORAL | 0 refills | Status: DC

## 2018-03-28 MED ORDER — ASPIRIN EC 81 MG PO TBEC
81.0000 mg | DELAYED_RELEASE_TABLET | Freq: Every day | ORAL | Status: DC
  Administered 2018-03-28: 81 mg via ORAL
  Filled 2018-03-28: qty 1

## 2018-03-28 NOTE — Progress Notes (Signed)
Family Meeting Note  Advance Directive:yes Today a meeting took place with the patient in specials  Patient is being admitted after stent placement in PDA. He has CAD depression and hyperlipidemia. Code status discussed patient wants to be full code. Daughter present in the room. prognosis is stable Time spent during [place] mins Fritzi Mandes, MD

## 2018-03-28 NOTE — Progress Notes (Signed)
SUBJECTIVE: patient denies any chest pain or shortness of breath   Vitals:   03/27/18 1430 03/27/18 1503 03/27/18 1952 03/28/18 0338  BP: 140/73 (!) 146/82 133/73 (!) 151/85  Pulse: 69 62 70 64  Resp: 16 16 18 18   Temp:  98.2 F (36.8 C) 98.1 F (36.7 C) 97.9 F (36.6 C)  TempSrc:  Oral Oral Oral  SpO2: 94% 97% 94% 96%  Weight:  233 lb 3.2 oz (105.8 kg)  232 lb (105.2 kg)  Height:  6\' 1"  (1.854 m)      Intake/Output Summary (Last 24 hours) at 03/28/2018 0842 Last data filed at 03/28/2018 0615 Gross per 24 hour  Intake 254.03 ml  Output 1700 ml  Net -1445.97 ml    LABS: Basic Metabolic Panel: Recent Labs    03/26/18 1138 03/27/18 1528  NA 142  --   K 3.9  --   CL 102  --   CO2 22  --   GLUCOSE 118*  --   BUN 18  --   CREATININE 0.93 0.99  CALCIUM 9.5  --    Liver Function Tests: No results for input(s): AST, ALT, ALKPHOS, BILITOT, PROT, ALBUMIN in the last 72 hours. No results for input(s): LIPASE, AMYLASE in the last 72 hours. CBC: Recent Labs    03/27/18 1528  WBC 8.3  8.2  HGB 16.8  16.9  HCT 49.7  50.6  MCV 90.8  91.3  PLT 176  184   Cardiac Enzymes: No results for input(s): CKTOTAL, CKMB, CKMBINDEX, TROPONINI in the last 72 hours. BNP: Invalid input(s): POCBNP D-Dimer: No results for input(s): DDIMER in the last 72 hours. Hemoglobin A1C: No results for input(s): HGBA1C in the last 72 hours. Fasting Lipid Panel: No results for input(s): CHOL, HDL, LDLCALC, TRIG, CHOLHDL, LDLDIRECT in the last 72 hours. Thyroid Function Tests: No results for input(s): TSH, T4TOTAL, T3FREE, THYROIDAB in the last 72 hours.  Invalid input(s): FREET3 Anemia Panel: No results for input(s): VITAMINB12, FOLATE, FERRITIN, TIBC, IRON, RETICCTPCT in the last 72 hours.   PHYSICAL EXAM General: Well developed, well nourished, in no acute distress HEENT:  Normocephalic and atramatic Neck:  No JVD.  Lungs: Clear bilaterally to auscultation and percussion. Heart:  HRRR . Normal S1 and S2 without gallops or murmurs.  Abdomen: Bowel sounds are positive, abdomen soft and non-tender  Msk:  Back normal, normal gait. Normal strength and tone for age. Extremities: No clubbing, cyanosis or edema.   Neuro: Alert and oriented X 3. Psych:  Good affect, responds appropriately  TELEMETRY: sinus rhythm  ASSESSMENT AND PLAN: status post PCI of the PDA with drug-eluting stent and history of unstable angina. Patient is clinically stable and can be discharged with follow-up in the office 1-2 weeks. He can go home on current medicines.  Principal Problem:   Unstable angina (HCC) Active Problems:   CAD (coronary artery disease)    Dionisio David, MD, [facility] 03/28/2018 8:42 AM

## 2018-03-28 NOTE — Plan of Care (Signed)

## 2018-03-28 NOTE — Progress Notes (Signed)
Cardiovascular and Pulmonary Nurse Navigator Note:    Kenneth Pruitt is a 67 y.o. male with a known history of coronary artery disease status post stent 13 years ago, hypertension, depression, hyperlipidemia is admitted status post cardiac catheterization and DES stent placement in PDA today.    Reviewed the location of CAD and where his stent was placed. Informed patient he will be given a stent card. Explained the purpose of the stent card. Instructed patient to keep stent card in his wallet.  ? Discussed modifiable risk factors including controlling blood pressure, cholesterol, and blood sugar; following heart healthy diet; maintaining healthy weight; exercise; and smoking cessation, if applicable.   ? Cardiac medications discussed including rationale for taking, mechanisms of action, and side effects. Stressed the importance of taking medications as prescribed. Patient being discharged on the following cardiac medications:   ASA, Plavix, Losartan, amlodipine, Lovaza, Niacin, Crestor, Hydrochlorothiazide, Hydralazine.  Assigned RN to go over when the next dose is due for all of patient's medications.   ? Discussed emergency plan for heart attack symptoms. Patient verbalized understanding of need to call 911 and not to drive himself to ER if having cardiac symptoms / chest pain.   Patient familiar with heart healthy diet and follows.   ? Smoking Cessation - Patient is a former smoker.   ? Cardiac Rehab/Exercise - Benefits of exercised discussed.  Patient is a cyclist and rides his bike almost daily up to 35 miles per day.  Patient has a passion for cycling and plans to continue cycling, but will take it easy for a week or two and then gradually increase his activities. 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 Patient stated he feels bad when he does not cycle.  Father is now deceased, but patient and wife continue to care for  patient's mother.   Patient declined to participate in Cardiac Rehab.    Notation made on the Cardiac Referral indicating this.    Patient appreciative of the information.  ? Roanna Epley, RN, BSN, [place]  [facility] Cardiac & Pulmonary Rehab  Cardiovascular & Pulmonary Nurse Navigator  Direct Line: [phone]  Department Phone #: [phone] Fax: [phone]  Email Address: [email or web address]

## 2018-03-28 NOTE — Discharge Instructions (Signed)
Acute Coronary Syndrome °Acute coronary syndrome (ACS) is a serious problem in which there is suddenly not enough blood and oxygen reaching the heart. ACS can result in chest pain or a heart attack. °What are the causes? °This condition may be caused by: °· A buildup of fat and cholesterol inside of the arteries (atherosclerosis). This is the most common cause. The buildup (plaque) can cause the blood vessels in your heart (coronary arteries) to become narrow or blocked. Plaque can also break off to form a clot. °· A coronary spasm. °· A tearing of the coronary artery (spontaneous coronary artery dissection). °· Low blood pressure (hypotension). °· An abnormal heart beat (arrhythmia). °· Using cocaine or methamphetamine. ° °What increases the risk? °The following factors may make you more likely to develop this condition: °· Age. °· History of chest pain, heart attack, or stroke. °· Being male. °· Family history of chest pain, heart disease, or stroke. °· Smoking. °· Inactivity. °· Being overweight. °· High cholesterol. °· High blood pressure (hypertension). °· Diabetes. °· Excessive alcohol use. ° °What are the signs or symptoms? °Common symptoms of this condition include: °· Chest pain. The pain may last long, or may stop and come back (recur). It may feel like: °? Crushing or squeezing. °? Tightness, pressure, fullness, or heaviness. °· Arm, neck, jaw, or back pain. °· Heartburn or indigestion. °· Shortness of breath. °· Nausea. °· Sudden cold sweats. °· Lightheadedness. °· Dizziness. °· Tiredness (fatigue). ° °Sometimes there are no symptoms. °How is this diagnosed? °This condition may be diagnosed through: °· An electrocardiogram (ECG). This test records the impulses of the heart. °· Blood tests. °· A CT scan of the chest. °· A coronary angiogram. This procedure checks for a blockage in the coronary arteries. ° °How is this treated? °Treatment for this condition may include: °· Oxygen. °· Medicines, such  as: °? Antiplatelet medicines and blood-thinning medicines, such as aspirin. These help prevent blood clots. °? Fibrinolytic therapy. This breaks apart a blood clot. °? Blood pressure medicines. °? Nitroglycerin. °? Pain medicine. °? Cholesterol medicine. °· A procedure called coronary angioplasty and stenting. This is done to widen a narrowed artery and keep it open. °· Coronary artery bypass surgery. This allows blood to pass the blockage to reach your heart. °· Cardiac rehabilitation. This is a program that helps improve your health and well-being. It includes exercise training, education, and counseling to help you recover. ° °Follow these instructions at home: °Eating and drinking °· Follow a heart-healthy, low-salt (sodium) diet. °· Use healthy cooking methods such as roasting, grilling, broiling, baking, poaching, steaming, or stir-frying. °· Talk to a dietitian to learn about healthy cooking methods and how to eat less sodium. °Medicines °· Take over-the-counter and prescription medicines only as told by your health care provider. °· Do not take these medicines unless your health care provider approves: °? Nonsteroidal anti-inflammatory drugs (NSAIDs), such as ibuprofen, naproxen, or celecoxib. °? Vitamin supplements that contain vitamin A or vitamin E. °? Hormone replacement therapy that contains estrogen. °Activity °· Join a cardiac rehabilitation program. °· Ask your health care provider: °? What activities and exercises are safe for you. °? If you should follow specific instructions about lifting, driving, or climbing stairs. °· If you are taking aspirin and another blood thinning medicine, avoid activities that are likely to result in an injury. The medicines can increase your risk of bleeding. °Lifestyle °· Do not use any products that contain nicotine or tobacco, such as cigarettes   and e-cigarettes. If you need help quitting, ask your health care provider. °· If you drink alcohol and your health care  provider says it is okay to drink, limit your alcohol intake to no more than 1 drink per day. One drink equals 12 oz of beer, 5 oz of wine, or 1½ oz of hard liquor. °· Maintain a healthy weight. If you need to lose weight, do it in a way that has been approved by your health care provider. °General instructions °· Tell all your health care providers about your heart condition, including your dentist. Some medicines can increase your risk of arrhythmia. °· Manage other health conditions, such as hypertension and diabetes. These conditions affect your heart. °· Learn ways to manage stress. °· Get screened for depression, and seek treatment if needed. °· Monitor your blood pressure if told by your health care provider. °· Keep your vaccinations up to date. Get the annual influenza vaccine. °· Keep all follow-up visits as told by your health care provider. This is important. °Contact a health care provider if: °· You feel overwhelmed or sad. °· You have trouble with your daily activities. °Get help right away if: °· You have pain in your chest, neck, arm, jaw, stomach, or back that recurs, and: °? Lasts more than a few minutes. °? Is not relieved by taking the medicineyour health care provider prescribed. °· You have unexplained: °? Heavy sweating. °? Heartburn or indigestion. °? Shortness of breath. °? Difficulty breathing. °? Nausea or vomiting. °? Fatigue. °? Nervousness or anxiety. °? Weakness. °? Diarrhea. °? Dark stools or blood in the stool. °· You have sudden lightheadedness or dizziness. °· Your blood pressure is higher than 180/120 °· You faint. °· You feel like hurting yourself or think about taking your own life. °These symptoms may represent a serious problem that is an emergency. Do not wait to see if the symptoms will go away. Get medical help right away. Call your local emergency services (911 in the [place] Do not drive yourself to the clinic or hospital. °Summary °· Acute coronary syndrome (ACS) is a  when there is not enough blood and oxygen being supplied to the heart. ACS can result in chest pain or a heart attack. °· Acute coronary syndrome is a medical emergency. If you have any symptoms of this condition, get help right away. °· Treatment includes oxygen, medicines, and procedures to open the blocked arteries and restore blood flow. °This information is not intended to replace advice given to you by your health care provider. Make sure you discuss any questions you have with your health care provider. °Document Released: 09/03/2005 Document Revised: 10/05/2016 Document Reviewed: 10/05/2016 °[employer]vier Interactive Patient Education © 2018 [employer] ° °

## 2018-03-28 NOTE — Discharge Summary (Signed)
[facility] NAME: Kenneth Pruitt    MR#:  [id number]  DATE OF BIRTH:  01-19-1951  DATE OF ADMISSION:  03/27/2018 ADMITTING PHYSICIAN: Fritzi Mandes, MD  DATE OF DISCHARGE: 03/28/2018 11:52 AM  PRIMARY CARE PHYSICIAN: Guadalupe Maple, MD    ADMISSION DIAGNOSIS:  CAD (coronary artery disease) [I25.10]  DISCHARGE DIAGNOSIS:  Principal Problem:   Unstable angina ([place]) Active Problems:   CAD (coronary artery disease)   SECONDARY DIAGNOSIS:   Past Medical History:  Diagnosis Date  . Coronary artery disease   . Depression   . Hyperlipidemia     HOSPITAL COURSE:   1.  Unstable angina with coronary artery disease.  Patient had a stent placed yesterday.  Patient feeling well on the day of discharge.  Referred to cardiac rehab.  Aspirin, Plavix, Crestor prescribed.  Patient has had issues with low heart rate in the past so this could be the reason why he is not on a beta-blocker.  Let Dr. Chancy Milroy decide on this. 2.  Hyperlipidemia unspecified.  LDL 45.  Continue Crestor and other medications. 3.  Hypertension on amlodipine, losartan and hydralazine and hydrochlorothiazide 4.  Depression on Lexapro    DISCHARGE CONDITIONS:   Satisfactory  CONSULTS OBTAINED:  Treatment Team:  Dionisio David, MD  DRUG ALLERGIES:   Allergies  Allergen Reactions  . Clonidine Derivatives Shortness Of Breath and Other (See Comments)    Low heart rate    DISCHARGE MEDICATIONS:   Allergies as of 03/28/2018      Reactions   Clonidine Derivatives Shortness Of Breath, Other (See Comments)   Low heart rate      Medication List    TAKE these medications   amLODipine 10 MG tablet Commonly known as:  NORVASC Take 1 tablet (10 mg total) by mouth daily.   ARIPiprazole 2 MG tablet Commonly known as:  ABILIFY TAKE 1 TABLET BY MOUTH AT BEDTIME FOR MOOD SYMPTOMS What changed:    how much to take  how to take this  when to take  this  additional instructions   aspirin 81 MG EC tablet Take 1 tablet (81 mg total) by mouth daily.   clopidogrel 75 MG tablet Commonly known as:  PLAVIX Take 75 mg by mouth daily.   CVS VIT D 5000 HIGH-POTENCY 5000 units capsule Generic drug:  Cholecalciferol Take 5,000 Units by mouth daily.   dutasteride 0.5 MG capsule Commonly known as:  AVODART Take 1 capsule (0.5 mg total) by mouth every evening.   escitalopram 10 MG tablet Commonly known as:  LEXAPRO Take 1.5 tablets (15 mg total) by mouth at bedtime.   Fish Oil 1000 MG Caps Take 1,000 capsules by mouth daily.   fluticasone 50 MCG/ACT nasal spray Commonly known as:  FLONASE USE 2 SPRAYS IN EACH NOSTRIL DAILY What changed:    how much to take  how to take this  when to take this   hydrALAZINE 25 MG tablet Commonly known as:  APRESOLINE Take 25 mg by mouth daily.   hydrochlorothiazide 25 MG tablet Commonly known as:  HYDRODIURIL Take 1 tablet (25 mg total) by mouth daily.   hydrOXYzine 25 MG capsule Commonly known as:  VISTARIL Take 1 capsule (25 mg total) by mouth 2 (two) times daily as needed for anxiety (only for severe anxiety sx and also for sleep).   losartan 100 MG tablet Commonly known as:  COZAAR Take 1 tablet (100 mg total) by  mouth every evening.   meloxicam 15 MG tablet Commonly known as:  MOBIC Take 1 tablet (15 mg total) by mouth at bedtime. What changed:  when to take this   mupirocin ointment 2 % Commonly known as:  BACTROBAN APPLY AS DIRECTED What changed:    how to take this  when to take this   niacin 1000 MG CR tablet Commonly known as:  NIASPAN Take 2 tablets (2,000 mg total) by mouth every evening.   pantoprazole 20 MG tablet Commonly known as:  PROTONIX Take 20 mg by mouth daily.   PROCTO-MED HC 2.5 % rectal cream Generic drug:  hydrocortisone APPLY RECTALLY TWICE DAILY AS DIRECTED What changed:  See the new instructions.   rosuvastatin 20 MG tablet Commonly  known as:  CRESTOR Take 1 tablet (20 mg total) by mouth every evening. What changed:    medication strength  how much to take   tamsulosin 0.4 MG Caps capsule Commonly known as:  FLOMAX Take 1 capsule (0.4 mg total) by mouth daily.   testosterone cypionate 200 MG/ML injection Commonly known as:  DEPOTESTOSTERONE CYPIONATE INJECT 0.75 ML INTRAMUSCULARLY EVERY 10 DAYS What changed:  See the new instructions.   vitamin B-12 1000 MCG tablet Commonly known as:  CYANOCOBALAMIN Take 1,000 mcg by mouth daily.        DISCHARGE INSTRUCTIONS:    Follow-up PMD 6 days Follow-up cardiology 1 week   If you experience worsening of your admission symptoms, develop shortness of breath, life threatening emergency, suicidal or homicidal thoughts you must seek medical attention immediately by calling 911 or calling your MD immediately  if symptoms less severe.  You Must read complete instructions/literature along with all the possible adverse reactions/side effects for all the Medicines you take and that have been prescribed to you. Take any new Medicines after you have completely understood and accept all the possible adverse reactions/side effects.   Please note  You were cared for by a hospitalist during your hospital stay. If you have any questions about your discharge medications or the care you received while you were in the hospital after you are discharged, you can call the unit and asked to speak with the hospitalist on call if the hospitalist that took care of you is not available. Once you are discharged, your primary care physician will handle any further medical issues. Please note that NO REFILLS for any discharge medications will be authorized once you are discharged, as it is imperative that you return to your primary care physician (or establish a relationship with a primary care physician if you do not have one) for your aftercare needs so that they can reassess your need for  medications and monitor your lab values.    Today   CHIEF COMPLAINT:  No chief complaint on file.   HISTORY OF PRESENT ILLNESS:  Kenneth Pruitt  is a 67 y.o. male with a known history of CAD brought in for cardiac catheterization and a stent was placed    VITAL SIGNS:  Blood pressure (!) 146/75, pulse 68, temperature 98.5 F (36.9 C), temperature source Oral, resp. rate 18, height 6\' 1"  (1.854 m), weight 105.2 kg (232 lb), SpO2 96 %.   PHYSICAL EXAMINATION:  GENERAL:  67 y.o.-year-old patient lying in the bed with no acute distress.  EYES: Pupils equal, round, reactive to light and accommodation. No scleral icterus. Extraocular muscles intact.  HEENT: Head atraumatic, normocephalic. Oropharynx and nasopharynx clear.  NECK:  Supple, no jugular venous distention.  No thyroid enlargement, no tenderness.  LUNGS: Normal breath sounds bilaterally, no wheezing, rales,rhonchi or crepitation. No use of accessory muscles of respiration.  CARDIOVASCULAR: S1, S2 normal. No murmurs, rubs, or gallops.  ABDOMEN: Soft, non-tender, non-distended. Bowel sounds present. No organomegaly or mass.  EXTREMITIES: No pedal edema, cyanosis, or clubbing.  NEUROLOGIC: Cranial nerves II through XII are intact. Muscle strength 5/5 in all extremities. Sensation intact. Gait not checked.  PSYCHIATRIC: The patient is alert and oriented x 3.  SKIN: No obvious rash, lesion, or ulcer.   DATA REVIEW:   CBC Recent Labs  Lab 03/27/18 1528  WBC 8.3  8.2  HGB 16.8  16.9  HCT 49.7  50.6  PLT 176  184    Chemistries  Recent Labs  Lab 03/26/18 1138 03/27/18 1528  NA 142  --   K 3.9  --   CL 102  --   CO2 22  --   GLUCOSE 118*  --   BUN 18  --   CREATININE 0.93 0.99  CALCIUM 9.5  --      Management plans discussed with the patient, family and they are in agreement.  CODE STATUS:  Code Status History    Date Active Date Inactive Code Status Order ID Comments User Context   03/27/2018 1342  03/28/2018 1457 Full Code [id number]  Fritzi Mandes, MD Inpatient      TOTAL TIME TAKING CARE OF THIS PATIENT: 35 minutes.    Loletha Grayer M.D on 03/28/2018 at 3:58 PM  Between 7am to 6pm - Pager - [phone]  After 6pm go to [email or web address] - password [email or web address] [facility]  [phone]  CC: Primary care physician; Guadalupe Maple, MD

## 2018-03-31 ENCOUNTER — Inpatient Hospital Stay: Payer: Medicare Other | Admitting: Physician Assistant

## 2018-04-14 ENCOUNTER — Ambulatory Visit: Payer: Medicare Other | Admitting: Psychiatry

## 2018-04-21 ENCOUNTER — Encounter: Payer: Self-pay | Admitting: Psychiatry

## 2018-04-21 ENCOUNTER — Ambulatory Visit (INDEPENDENT_AMBULATORY_CARE_PROVIDER_SITE_OTHER): Payer: Medicare Other | Admitting: Psychiatry

## 2018-04-21 ENCOUNTER — Other Ambulatory Visit: Payer: Self-pay

## 2018-04-21 VITALS — BP 166/88 | HR 63 | Temp 98.3°F | Wt 241.4 lb

## 2018-04-21 DIAGNOSIS — F330 Major depressive disorder, recurrent, mild: Secondary | ICD-10-CM

## 2018-04-21 DIAGNOSIS — F411 Generalized anxiety disorder: Secondary | ICD-10-CM

## 2018-04-21 DIAGNOSIS — F5105 Insomnia due to other mental disorder: Secondary | ICD-10-CM

## 2018-04-21 MED ORDER — ESCITALOPRAM OXALATE 20 MG PO TABS
20.0000 mg | ORAL_TABLET | Freq: Every day | ORAL | 1 refills | Status: DC
Start: 2018-04-21 — End: 2018-05-08

## 2018-04-21 NOTE — Patient Instructions (Signed)

## 2018-04-21 NOTE — Progress Notes (Signed)
[facility] MD OP Progress Note  04/21/2018 12:52 PM Kenneth Pruitt  MRN:  [id number]  Chief Complaint:  Chief Complaint    Follow-up; Medication Refill    ' I am anxious."  HPI: [name] is a 67 year old Caucasian male, retired, married, lives in [place], has a history of depression, anxiety,CAD  presented to the clinic today for a follow-up visit.  Patient today reports he was recently admitted to the hospital for unstable angina with coronary artery disease and stent placement.  Pt continues to recover from the same.  Patient reports he has been feeling more anxious and depressed ever since this incident.  He reports he is worried about his future as well as whether he can be as active like he used to before.  Patient reports his wife also feels he may need medication readjustment today due to his mood symptoms.  Patient reports he tolerates the Abilify and the Lexapro well.  He continues to be on 15 mg of Lexapro.  Discussed increasing the Lexapro dosage today.  He agrees with plan.  Patient denies any sleep problems, appetite changes.  Patient reports he tried cardiac rehab initially however felt better riding his own bike which made him happier.  Patient reports his cardiologist hence gave him the permission to start riding his bike again and his long-term goal is to go for a bike race in October.   Visit Diagnosis:    ICD-10-CM   1. MDD (major depressive disorder), recurrent episode, mild (HCC) F33.0 escitalopram (LEXAPRO) 20 MG tablet  2. Insomnia due to mental disorder F51.05   3. GAD (generalized anxiety disorder) F41.1 escitalopram (LEXAPRO) 20 MG tablet    Past Psychiatric History: Have reviewed past psychiatric history from my progress note on 12/16/2017.  Past Medical History:  Past Medical History:  Diagnosis Date  . Coronary artery disease   . Depression   . Hyperlipidemia     Past Surgical History:  Procedure Laterality Date  . CARDIAC CATHETERIZATION N/A 04/26/2015    Procedure: Left Heart Cath;  Surgeon: Dionisio David, MD;  Location: [facility];  Service: Cardiovascular;  Laterality: N/A;  . CORONARY ANGIOPLASTY    . CORONARY STENT INTERVENTION N/A 03/27/2018   Procedure: CORONARY STENT INTERVENTION;  Surgeon: Yolonda Kida, MD;  Location: [facility];  Service: Cardiovascular;  Laterality: N/A;  . EYE SURGERY Bilateral    cataract  . LEFT HEART CATH AND CORONARY ANGIOGRAPHY Left 03/27/2018   Procedure: Left Heart Cath with possible coronary intervention;  Surgeon: Dionisio David, MD;  Location: [facility];  Service: Cardiovascular;  Laterality: Left;    Family Psychiatric History: Reviewed family psychiatric history from my progress note on 12/16/2017.  Family History:  Family History  Problem Relation Age of Onset  . Diabetes Mother   . Hypertension Mother   . Cancer Father   . Depression Father   . Anxiety disorder Father   . Depression Daughter     Social History: Reviewed social history from my progress note on 12/16/2017. Social History   Socioeconomic History  . Marital status: Married    Spouse name: ann  . Number of children: 2  . Years of education: Not on file  . Highest education level: Master's degree (e.g., MA, MS, MEng, MEd, MSW, MBA)  Occupational History    Comment: retired  Scientific laboratory technician  . Financial resource strain: Not hard at all  . Food insecurity:    Worry: Never true  Inability: Never true  . Transportation needs:    Medical: No    Non-medical: No  Tobacco Use  . Smoking status: Former Smoker    Years: 30.00    Types: Cigars    Last attempt to quit: 07/18/2006    Years since quitting: 11.7  . Smokeless tobacco: Never Used  Substance and Sexual Activity  . Alcohol use: No  . Drug use: No  . Sexual activity: Not Currently  Lifestyle  . Physical activity:    Days per week: 0 days    Minutes per session: 0 min  . Stress: Not at all  Relationships  . Social connections:     Talks on phone: More than three times a week    Gets together: More than three times a week    Attends religious service: More than 4 times per year    Active member of club or organization: Yes    Attends meetings of clubs or organizations: More than 4 times per year    Relationship status: Married  Other Topics Concern  . Not on file  Social History Narrative  . Not on file    Allergies:  Allergies  Allergen Reactions  . Clonidine Derivatives Shortness Of Breath and Other (See Comments)    Low heart rate    Metabolic Disorder Labs: No results found for: HGBA1C, MPG No results found for: PROLACTIN Lab Results  Component Value Date   CHOL 118 03/28/2018   TRIG 186 (H) 03/28/2018   HDL 36 (L) 03/28/2018   CHOLHDL 3.3 03/28/2018   VLDL 37 03/28/2018   LDLCALC 45 03/28/2018   LDLCALC 41 10/11/2017   Lab Results  Component Value Date   TSH 1.030 02/05/2017   TSH 1.270 02/02/2016    Therapeutic Level Labs: No results found for: LITHIUM No results found for: VALPROATE No components found for:  CBMZ  Current Medications: Current Outpatient Medications  Medication Sig Dispense Refill  . amLODipine (NORVASC) 10 MG tablet Take 1 tablet (10 mg total) by mouth daily. 90 tablet 4  . ARIPiprazole (ABILIFY) 2 MG tablet TAKE 1 TABLET BY MOUTH AT BEDTIME FOR MOOD SYMPTOMS (Patient taking differently: Take 2 mg by mouth at bedtime. ) 30 tablet 2  . aspirin EC 81 MG EC tablet Take 1 tablet (81 mg total) by mouth daily.    . Cholecalciferol (CVS VIT D 5000 HIGH-POTENCY) 5000 units capsule Take 5,000 Units by mouth daily.    . clopidogrel (PLAVIX) 75 MG tablet Take 75 mg by mouth daily.    Marland Kitchen dutasteride (AVODART) 0.5 MG capsule Take 1 capsule (0.5 mg total) by mouth every evening.    . escitalopram (LEXAPRO) 10 MG tablet Take 1.5 tablets (15 mg total) by mouth at bedtime. 45 tablet 2  . escitalopram (LEXAPRO) 20 MG tablet Take 1 tablet (20 mg total) by mouth at bedtime. 90 tablet 1   . fluticasone (FLONASE) 50 MCG/ACT nasal spray USE 2 SPRAYS IN EACH NOSTRIL DAILY (Patient taking differently: Use 1 spray in each nostril daily as needed for allergies) 16 g 6  . hydrALAZINE (APRESOLINE) 25 MG tablet Take 25 mg by mouth daily.    . hydrALAZINE (APRESOLINE) 50 MG tablet     . hydrochlorothiazide (HYDRODIURIL) 25 MG tablet Take 1 tablet (25 mg total) by mouth daily. 90 tablet 3  . hydrOXYzine (VISTARIL) 25 MG capsule Take 1 capsule (25 mg total) by mouth 2 (two) times daily as needed for anxiety (only for severe anxiety  sx and also for sleep). 60 capsule 1  . losartan (COZAAR) 100 MG tablet Take 1 tablet (100 mg total) by mouth every evening.    . meloxicam (MOBIC) 15 MG tablet Take 1 tablet (15 mg total) by mouth at bedtime. 30 tablet 6  . mupirocin ointment (BACTROBAN) 2 % APPLY AS DIRECTED (Patient taking differently: Apply topically daily as needed for irritation) 22 g 1  . niacin (NIASPAN) 1000 MG CR tablet Take 2 tablets (2,000 mg total) by mouth every evening.    . Omega-3 Fatty Acids (FISH OIL) 1000 MG CAPS Take 1,000 capsules by mouth daily.     . pantoprazole (PROTONIX) 20 MG tablet Take 20 mg by mouth daily.     Marland Kitchen PROCTO-MED HC 2.5 % rectal cream APPLY RECTALLY TWICE DAILY AS DIRECTED (Patient taking differently: Apply rectally twice daily as needed for irritation) 28.35 g 1  . rosuvastatin (CRESTOR) 20 MG tablet Take 1 tablet (20 mg total) by mouth every evening. 30 tablet 0  . tamsulosin (FLOMAX) 0.4 MG CAPS capsule Take 1 capsule (0.4 mg total) by mouth daily. 90 capsule 4  . testosterone cypionate (DEPOTESTOSTERONE CYPIONATE) 200 MG/ML injection INJECT 0.75 ML INTRAMUSCULARLY EVERY 10 DAYS (Patient taking differently: INJECT 150 MG INTRAMUSCULARLY EVERY 10 DAYS) 10 mL 3  . vitamin B-12 (CYANOCOBALAMIN) 1000 MCG tablet Take 1,000 mcg by mouth daily.     No current facility-administered medications for this visit.      Musculoskeletal: Strength & Muscle Tone:  within normal limits Gait & Station: normal Patient leans: N/A  Psychiatric Specialty Exam: Review of Systems  Psychiatric/Behavioral: Positive for depression. The patient is nervous/anxious.   All other systems reviewed and are negative.   Blood pressure (!) 166/88, pulse 63, temperature 98.3 F (36.8 C), temperature source Oral, weight 241 lb 6.4 oz (109.5 kg).Body mass index is 31.85 kg/m.  General Appearance: Casual  Eye Contact:  Fair  Speech:  Normal Rate  Volume:  Normal  Mood:  Anxious and Dysphoric  Affect:  Congruent  Thought Process:  Goal Directed and Descriptions of Associations: Intact  Orientation:  Full (Time, Place, and Person)  Thought Content: Logical   Suicidal Thoughts:  No  Homicidal Thoughts:  No  Memory:  Immediate;   Fair Recent;   Fair Remote;   Fair  Judgement:  Fair  Insight:  Fair  Psychomotor Activity:  Normal  Concentration:  Concentration: Fair and Attention Span: Fair  Recall:  [employer] of Knowledge: Fair  Language: Fair  Akathisia:  No  Handed:  Right  AIMS (if indicated): Denies tremors , rigidity, stiffness  Assets:  Communication Skills Desire for Improvement Social Support  ADL's:  Intact  Cognition: WNL  Sleep:  Fair   Screenings: PHQ2-9     Clinical Support from 10/11/2017 in [facility] Visit from 03/12/2017 in [facility] Visit from 02/02/2016 in [facility]  PHQ-2 Total Score  6  1  1   PHQ-9 Total Score  24  -  -       Assessment and Plan: Destan is a 67 year old Caucasian male who has a history of depression, anxiety, multiple medical problems including essential hypertension, coronary artery disease, status post stent placement, lipidemia, BPH, presented to the clinic today for a follow-up visit.  Patient reports recent hospital admission for cardiac problems and another stent placement.  He continues to have increased anxiety and depressive symptoms ever since.  Will  make medication changes as  noted below.  Plan MDD Increase Lexapro to 20 mg p.o. daily Continue Abilify 2 mg p.o.daily. Continue CBT with [name].  GAD Continue Lexapro 20 mg p.o. daily. Continue hydroxyzine 25 mg p.o. twice daily as needed for severe anxiety attacks  Discussed mindfulness as well as deep breathing techniques.  He will also continue to work with [name].  Follow up in clinic in 4 weeks.  More than 50 % of the time was spent for psychoeducation and supportive psychotherapy and care coordination.  This note was generated in part or whole with voice recognition software. Voice recognition is usually quite accurate but there are transcription errors that can and very often do occur. I apologize for any typographical errors that were not detected and corrected.       Ursula Alert, MD 04/21/2018, 12:52 PM

## 2018-04-29 ENCOUNTER — Ambulatory Visit: Payer: Medicare Other | Admitting: Licensed Clinical Social Worker

## 2018-05-08 ENCOUNTER — Encounter: Payer: Self-pay | Admitting: Psychiatry

## 2018-05-08 ENCOUNTER — Other Ambulatory Visit: Payer: Self-pay

## 2018-05-08 ENCOUNTER — Ambulatory Visit: Payer: Medicare Other | Admitting: Psychiatry

## 2018-05-08 VITALS — BP 161/82 | HR 68 | Temp 98.8°F | Wt 234.8 lb

## 2018-05-08 DIAGNOSIS — F330 Major depressive disorder, recurrent, mild: Secondary | ICD-10-CM

## 2018-05-08 DIAGNOSIS — F411 Generalized anxiety disorder: Secondary | ICD-10-CM | POA: Diagnosis not present

## 2018-05-08 MED ORDER — BUPROPION HCL 75 MG PO TABS: 75.0000 mg | ORAL_TABLET | Freq: Every day | ORAL | 2 refills | Status: DC

## 2018-05-08 MED ORDER — ESCITALOPRAM OXALATE 10 MG PO TABS: 15.0000 mg | ORAL_TABLET | Freq: Every day | ORAL | 2 refills | Status: DC

## 2018-05-08 NOTE — Patient Instructions (Signed)
       Bupropion tablets (Depression/Mood Disorders) What is this medicine? BUPROPION (byoo PROE pee on) is used to treat depression. This medicine may be used for other purposes; ask your health care provider or pharmacist if you have questions. COMMON BRAND NAME(S): Wellbutrin What should I tell my health care provider before I take this medicine? They need to know if you have any of these conditions: -an eating disorder, such as anorexia or bulimia -bipolar disorder or psychosis -diabetes or high blood sugar, treated with medication -glaucoma -heart disease, previous heart attack, or irregular heart beat -head injury or brain tumor -high blood pressure -kidney or liver disease -seizures -suicidal thoughts or a previous suicide attempt -Tourette's syndrome -weight loss -an unusual or allergic reaction to bupropion, other medicines, foods, dyes, or preservatives -breast-feeding -pregnant or trying to become pregnant How should I use this medicine? Take this medicine by mouth with a glass of water. Follow the directions on the prescription label. You can take it with or without food. If it upsets your stomach, take it with food. Take your medicine at regular intervals. Do not take your medicine more often than directed. Do not stop taking this medicine suddenly except upon the advice of your doctor. Stopping this medicine too quickly may cause serious side effects or your condition may worsen. A special MedGuide will be given to you by the pharmacist with each prescription and refill. Be sure to read this information carefully each time. Talk to your pediatrician regarding the use of this medicine in children. Special care may be needed. Overdosage: If you think you have taken too much of this medicine contact a poison control center or emergency room at once. NOTE: This medicine is only for you. Do not share this medicine with others. What if I miss a dose? If you miss a dose,  take it as soon as you can. If it is less than four hours to your next dose, take only that dose and skip the missed dose. Do not take double or extra doses. What may interact with this medicine? Do not take this medicine with any of the following medications: -linezolid -MAOIs like Azilect, Carbex, Eldepryl, Marplan, Nardil, and Parnate -methylene blue (injected into a vein) -other medicines that contain bupropion like Zyban This medicine may also interact with the following medications: -alcohol -certain medicines for anxiety or sleep -certain medicines for blood pressure like metoprolol, propranolol -certain medicines for depression or psychotic disturbances -certain medicines for HIV or AIDS like efavirenz, lopinavir, nelfinavir, ritonavir -certain medicines for irregular heart beat like propafenone, flecainide -certain medicines for Parkinson's disease like amantadine, levodopa -certain medicines for seizures like carbamazepine, phenytoin, phenobarbital -cimetidine -clopidogrel -cyclophosphamide -digoxin -furazolidone -isoniazid -nicotine -orphenadrine -procarbazine -steroid medicines like prednisone or cortisone -stimulant medicines for attention disorders, weight loss, or to stay awake -tamoxifen -theophylline -thiotepa -ticlopidine -tramadol -warfarin This list may not describe all possible interactions. Give your health care provider a list of all the medicines, herbs, non-prescription drugs, or dietary supplements you use. Also tell them if you smoke, drink alcohol, or use illegal drugs. Some items may interact with your medicine. What should I watch for while using this medicine? Tell your doctor if your symptoms do not get better or if they get worse. Visit your doctor or health care professional for regular checks on your progress. Because it may take several weeks to see the full effects of this medicine, it is important to continue your treatment as prescribed by    your doctor. Patients and their families should watch out for new or worsening thoughts of suicide or depression. Also watch out for sudden changes in feelings such as feeling anxious, agitated, panicky, irritable, hostile, aggressive, impulsive, severely restless, overly excited and hyperactive, or not being able to sleep. If this happens, especially at the beginning of treatment or after a change in dose, call your health care professional. Avoid alcoholic drinks while taking this medicine. Drinking excessive alcoholic beverages, using sleeping or anxiety medicines, or quickly stopping the use of these agents while taking this medicine may increase your risk for a seizure. Do not drive or use heavy machinery until you know how this medicine affects you. This medicine can impair your ability to perform these tasks. Do not take this medicine close to bedtime. It may prevent you from sleeping. Your mouth may get dry. Chewing sugarless gum or sucking hard candy, and drinking plenty of water may help. Contact your doctor if the problem does not go away or is severe. What side effects may I notice from receiving this medicine? Side effects that you should report to your doctor or health care professional as soon as possible: -allergic reactions like skin rash, itching or hives, swelling of the face, lips, or tongue -breathing problems -changes in vision -confusion -elevated mood, decreased need for sleep, racing thoughts, impulsive behavior -fast or irregular heartbeat -hallucinations, loss of contact with reality -increased blood pressure -redness, blistering, peeling or loosening of the skin, including inside the mouth -seizures -suicidal thoughts or other mood changes -unusually weak or tired -vomiting Side effects that usually do not require medical attention (report to your doctor or health care professional if they continue or are bothersome): -constipation -headache -loss of  appetite -nausea -tremors -weight loss This list may not describe all possible side effects. Call your doctor for medical advice about side effects. You may report side effects to FDA at 1-800-FDA-1088. Where should I keep my medicine? Keep out of the reach of children. Store at room temperature between 20 and 25 degrees C (68 and 77 degrees F), away from direct sunlight and moisture. Keep tightly closed. Throw away any unused medicine after the expiration date. NOTE: This sheet is a summary. It may not cover all possible information. If you have questions about this medicine, talk to your doctor, pharmacist, or health care provider.  2018 Elsevier/Gold Standard (2016-02-24 13:44:21)  

## 2018-05-08 NOTE — Progress Notes (Signed)
Kenneth Pruitt OP Progress Note  05/08/2018 5:25 PM Kenneth Pruitt  MRN:  [id number]  Chief Complaint: ' I am here for follow up." Chief Complaint    Follow-up; Medication Refill     HPI: Kenneth Pruitt is a 67 year old Caucasian male, retired, married, lives in [place], has a history of depression, anxiety, CAD, presented to the clinic today for a follow-up visit.  Patient recently had inpatient hospital admission for unstable angina with coronary artery disease and stent placement (03/27/2018).  Patient is currently recovering from the same.  Patient today reports he continues to feel down and depressed.  He reports ever since he increased his Lexapro to 20 mg he feels more lethargic and tired.  He reports he has no motivation to do anything because of his tiredness during the day.  He has been sleeping 2-3 hours during the day and also sleeping at night.  He wonders whether the Lexapro is contributing to the same.  He is also worried about side effects like weight gain from Lexapro.  Patient reports 2 days ago he went down back to 15 mg to see if that will make him feel better with regards to his lethargy and tiredness.  Patient reports it may have helped a little bit but he is not very sure.  Patient reports he continues to have stressors of taking care of his mom who is elderly and is at the nursing facility.  Patient however reports he is trying to keep his hopes up for the upcoming bike ride  which is going to be cross country.  He reports he takes it every year and enjoys it.  He wants to get in shape prior to that and it is going to be end of September to beginning of October.  Discussed readjusting his dosage of Lexapro as well as adding bupropion.  Patient since is worried about weight gain, discussed about discontinuing Abilify which can contribute to weight gain.  Patient reports he is agreeable. Visit Diagnosis:    ICD-10-CM   1. MDD (major depressive disorder), recurrent episode, mild (HCC)  F33.0 escitalopram (LEXAPRO) 10 MG tablet    buPROPion (WELLBUTRIN) 75 MG tablet  2. GAD (generalized anxiety disorder) F41.1 escitalopram (LEXAPRO) 10 MG tablet    Past Psychiatric History: Have reviewed past psychiatric history from my progress note on 12/16/2017  Past Medical History:  Past Medical History:  Diagnosis Date  . Coronary artery disease   . Depression   . Hyperlipidemia     Past Surgical History:  Procedure Laterality Date  . CARDIAC CATHETERIZATION N/A 04/26/2015   Procedure: Left Heart Cath;  Surgeon: Dionisio David, Pruitt;  Location: [facility];  Service: Cardiovascular;  Laterality: N/A;  . CORONARY ANGIOPLASTY    . CORONARY STENT INTERVENTION N/A 03/27/2018   Procedure: CORONARY STENT INTERVENTION;  Surgeon: Yolonda Kida, Pruitt;  Location: [facility];  Service: Cardiovascular;  Laterality: N/A;  . EYE SURGERY Bilateral    cataract  . LEFT HEART CATH AND CORONARY ANGIOGRAPHY Left 03/27/2018   Procedure: Left Heart Cath with possible coronary intervention;  Surgeon: Dionisio David, Pruitt;  Location: [facility];  Service: Cardiovascular;  Laterality: Left;    Family Psychiatric History: Reviewed family psychiatric history from my progress note on 12/16/2017  Family History:  Family History  Problem Relation Age of Onset  . Diabetes Mother   . Hypertension Mother   . Cancer Father   . Depression Father   .  Anxiety disorder Father   . Depression Daughter     Social History: Reviewed social history from my progress note on 12/16/2017 Social History   Socioeconomic History  . Marital status: Married    Spouse name: ann  . Number of children: 2  . Years of education: Not on file  . Highest education level: Master's degree (e.g., MA, MS, MEng, MEd, MSW, MBA)  Occupational History    Comment: retired  Scientific laboratory technician  . Financial resource strain: Not hard at all  . Food insecurity:    Worry: Never true    Inability: Never true  .  Transportation needs:    Medical: No    Non-medical: No  Tobacco Use  . Smoking status: Former Smoker    Years: 30.00    Types: Cigars    Last attempt to quit: 07/18/2006    Years since quitting: 11.8  . Smokeless tobacco: Never Used  Substance and Sexual Activity  . Alcohol use: No  . Drug use: No  . Sexual activity: Not Currently  Lifestyle  . Physical activity:    Days per week: 0 days    Minutes per session: 0 min  . Stress: Not at all  Relationships  . Social connections:    Talks on phone: More than three times a week    Gets together: More than three times a week    Attends religious service: More than 4 times per year    Active member of club or organization: Yes    Attends meetings of clubs or organizations: More than 4 times per year    Relationship status: Married  Other Topics Concern  . Not on file  Social History Narrative  . Not on file    Allergies:  Allergies  Allergen Reactions  . Clonidine Derivatives Shortness Of Breath and Other (See Comments)    Low heart rate    Metabolic Disorder Labs: No results found for: HGBA1C, MPG No results found for: PROLACTIN Lab Results  Component Value Date   CHOL 118 03/28/2018   TRIG 186 (H) 03/28/2018   HDL 36 (L) 03/28/2018   CHOLHDL 3.3 03/28/2018   VLDL 37 03/28/2018   LDLCALC 45 03/28/2018   LDLCALC 41 10/11/2017   Lab Results  Component Value Date   TSH 1.030 02/05/2017   TSH 1.270 02/02/2016    Therapeutic Level Labs: No results found for: LITHIUM No results found for: VALPROATE No components found for:  CBMZ  Current Medications: Current Outpatient Medications  Medication Sig Dispense Refill  . amLODipine (NORVASC) 10 MG tablet Take 1 tablet (10 mg total) by mouth daily. 90 tablet 4  . aspirin EC 81 MG EC tablet Take 1 tablet (81 mg total) by mouth daily.    Marland Kitchen buPROPion (WELLBUTRIN) 75 MG tablet Take 1 tablet (75 mg total) by mouth daily. 30 tablet 2  . Cholecalciferol ([employer] VIT D 5000  HIGH-POTENCY) 5000 units capsule Take 5,000 Units by mouth daily.    . clopidogrel (PLAVIX) 75 MG tablet Take 75 mg by mouth daily.    Marland Kitchen dutasteride (AVODART) 0.5 MG capsule Take 1 capsule (0.5 mg total) by mouth every evening.    . escitalopram (LEXAPRO) 10 MG tablet Take 1.5 tablets (15 mg total) by mouth at bedtime. 45 tablet 2  . fluticasone (FLONASE) 50 MCG/ACT nasal spray USE 2 SPRAYS IN EACH NOSTRIL DAILY (Patient taking differently: Use 1 spray in each nostril daily as needed for allergies) 16 g 6  .  hydrALAZINE (APRESOLINE) 25 MG tablet Take 25 mg by mouth daily.    . hydrALAZINE (APRESOLINE) 50 MG tablet     . hydrochlorothiazide (HYDRODIURIL) 25 MG tablet Take 1 tablet (25 mg total) by mouth daily. 90 tablet 3  . hydrOXYzine (VISTARIL) 25 MG capsule Take 1 capsule (25 mg total) by mouth 2 (two) times daily as needed for anxiety (only for severe anxiety sx and also for sleep). 60 capsule 1  . losartan (COZAAR) 100 MG tablet Take 1 tablet (100 mg total) by mouth every evening.    . meloxicam (MOBIC) 15 MG tablet Take 1 tablet (15 mg total) by mouth at bedtime. 30 tablet 6  . mupirocin ointment (BACTROBAN) 2 % APPLY AS DIRECTED (Patient taking differently: Apply topically daily as needed for irritation) 22 g 1  . niacin (NIASPAN) 1000 MG CR tablet Take 2 tablets (2,000 mg total) by mouth every evening.    . Omega-3 Fatty Acids (FISH OIL) 1000 MG CAPS Take 1,000 capsules by mouth daily.     . pantoprazole (PROTONIX) 20 MG tablet Take 20 mg by mouth daily.     Marland Kitchen PROCTO-MED HC 2.5 % rectal cream APPLY RECTALLY TWICE DAILY AS DIRECTED (Patient taking differently: Apply rectally twice daily as needed for irritation) 28.35 g 1  . rosuvastatin (CRESTOR) 20 MG tablet Take 1 tablet (20 mg total) by mouth every evening. 30 tablet 0  . tamsulosin (FLOMAX) 0.4 MG CAPS capsule Take 1 capsule (0.4 mg total) by mouth daily. 90 capsule 4  . testosterone cypionate (DEPOTESTOSTERONE CYPIONATE) 200 MG/ML  injection INJECT 0.75 ML INTRAMUSCULARLY EVERY 10 DAYS (Patient taking differently: INJECT 150 MG INTRAMUSCULARLY EVERY 10 DAYS) 10 mL 3  . vitamin B-12 (CYANOCOBALAMIN) 1000 MCG tablet Take 1,000 mcg by mouth daily.     No current facility-administered medications for this visit.      Musculoskeletal: Strength & Muscle Tone: within normal limits Gait & Station: normal Patient leans: N/A  Psychiatric Specialty Exam: Review of Systems  Psychiatric/Behavioral: Positive for depression. The patient is nervous/anxious.   All other systems reviewed and are negative.   Blood pressure (!) 161/82, pulse 68, temperature 98.8 F (37.1 C), temperature source Oral, weight 234 lb 12.8 oz (106.5 kg).Body mass index is 30.98 kg/m.  General Appearance: Casual  Eye Contact:  Fair  Speech:  Normal Rate  Volume:  Normal  Mood:  Anxious and Dysphoric  Affect:  Congruent  Thought Process:  Goal Directed and Descriptions of Associations: Intact  Orientation:  Full (Time, Place, and Person)  Thought Content: Logical   Suicidal Thoughts:  No  Homicidal Thoughts:  No  Memory:  Immediate;   Fair Recent;   Fair Remote;   Fair  Judgement:  Fair  Insight:  Fair  Psychomotor Activity:  Normal  Concentration:  Concentration: Fair and Attention Span: Fair  Recall:  [employer] of Knowledge: Fair  Language: Fair  Akathisia:  No  Handed:  Right  AIMS (if indicated): na  Assets:  Communication Skills Desire for Improvement Social Support  ADL's:  Intact  Cognition: WNL  Sleep:  Fair   Screenings: PHQ2-9     Clinical Support from 10/11/2017 in [facility] Visit from 03/12/2017 in [facility] Visit from 02/02/2016 in [facility]  PHQ-2 Total Score  6  1  1   PHQ-9 Total Score  24  -  -       Assessment and Plan: Viren is a 67 year old  Caucasian male who has a history of depression, anxiety, multiple medical problems including essential  hypertension, coronary artery disease, status post stent placement, hyperlipidemia, BPH, presented to the clinic today for a follow-up visit.  Patient with recent inpatient hospital admission for cardiac problems, stent placement is currently recovering from the same.  Patient however continues to struggle with lethargy, fatigue and depression.  Patient did not tolerate the increased dosage of Lexapro well and is concerned about side effects like weight gain from the Abilify.  Will make the following changes.  Plan MDD Reduce Lexapro to 15 mg p.o. daily Discontinue Abilify for concerns of weight gain. Start Wellbutrin 75 mg p.o. daily to augment the Lexapro.  GAD Continue Lexapro at the reduced dose of 15 mg p.o. daily. Continue hydroxyzine 25 mg p.o. twice daily as needed for severe anxiety attacks   Patient will continue psychotherapy with [name], discussed to schedule an appointment sooner.  Follow-up in clinic in 4 weeks or sooner if needed.  More than 50 % of the time was spent for psychoeducation and supportive psychotherapy and care coordination.  This note was generated in part or whole with voice recognition software. Voice recognition is usually quite accurate but there are transcription errors that can and very often do occur. I apologize for any typographical errors that were not detected and corrected.         Ursula Alert, Pruitt 05/08/2018, 5:25 PM

## 2018-05-14 ENCOUNTER — Ambulatory Visit: Payer: Medicare Other | Admitting: Licensed Clinical Social Worker

## 2018-05-14 DIAGNOSIS — F330 Major depressive disorder, recurrent, mild: Secondary | ICD-10-CM | POA: Diagnosis not present

## 2018-05-20 ENCOUNTER — Other Ambulatory Visit: Payer: Self-pay | Admitting: Family Medicine

## 2018-05-21 NOTE — Telephone Encounter (Signed)
Left message for pt. - why did he need refill. Dr. Jeananne Rama pt.

## 2018-05-22 ENCOUNTER — Ambulatory Visit: Payer: Self-pay | Admitting: Psychiatry

## 2018-05-22 ENCOUNTER — Ambulatory Visit: Payer: Medicare Other | Admitting: Family Medicine

## 2018-05-22 ENCOUNTER — Encounter: Payer: Self-pay | Admitting: Family Medicine

## 2018-05-22 VITALS — BP 130/70 | HR 78 | Wt 233.8 lb

## 2018-05-22 DIAGNOSIS — I10 Essential (primary) hypertension: Secondary | ICD-10-CM | POA: Diagnosis not present

## 2018-05-22 NOTE — Progress Notes (Signed)
BP 130/70 (BP Location: Left Arm)   Pulse 78   Wt 233 lb 12.8 oz (106.1 kg)   SpO2 99%   BMI 30.85 kg/m    Subjective:    Patient ID: Kenneth Pruitt, male    DOB: Jun 02, 1951, 67 y.o.   MRN: [id number]  HPI: Kenneth Pruitt is a 67 y.o. male  Chief Complaint  Patient presents with  . Follow-up  . Hypertension  Patient recheck hypertension doing well taking medications without problems is continuing to do long distance bike riding and takes his hydralazine after his bike ride in the late morning and then at bedtime.  Has good control of blood pressure with no side effects and taking medications without problems.   Relevant past medical, surgical, family and social history reviewed and updated as indicated. Interim medical history since our last visit reviewed. Allergies and medications reviewed and updated.  Review of Systems  Constitutional: Negative.   Respiratory: Negative.   Cardiovascular: Negative.     Per HPI unless specifically indicated above     Objective:    BP 130/70 (BP Location: Left Arm)   Pulse 78   Wt 233 lb 12.8 oz (106.1 kg)   SpO2 99%   BMI 30.85 kg/m   Wt Readings from Last 3 Encounters:  05/22/18 233 lb 12.8 oz (106.1 kg)  03/28/18 232 lb (105.2 kg)  03/26/18 238 lb 8 oz (108.2 kg)    Physical Exam  Constitutional: He is oriented to person, place, and time. He appears well-developed and well-nourished.  HENT:  Head: Normocephalic and atraumatic.  Eyes: Conjunctivae and EOM are normal.  Neck: Normal range of motion.  Cardiovascular: Normal rate, regular rhythm and normal heart sounds.  Pulmonary/Chest: Effort normal and breath sounds normal.  Musculoskeletal: Normal range of motion.  Neurological: He is alert and oriented to person, place, and time.  Skin: No erythema.  Psychiatric: He has a normal mood and affect. His behavior is normal. Judgment and thought content normal.    Results for orders placed or performed during the hospital  encounter of 03/27/18  CBC  Result Value Ref Range   WBC 8.3 3.8 - 10.6 K/uL   RBC 5.47 4.40 - 5.90 MIL/uL   Hemoglobin 16.8 13.0 - 18.0 g/dL   HCT 49.7 40.0 - 52.0 %   MCV 90.8 80.0 - 100.0 fL   MCH 30.7 26.0 - 34.0 pg   MCHC 33.8 32.0 - 36.0 g/dL   RDW 13.9 11.5 - 14.5 %   Platelets 176 150 - 440 K/uL  CBC  Result Value Ref Range   WBC 8.2 3.8 - 10.6 K/uL   RBC 5.54 4.40 - 5.90 MIL/uL   Hemoglobin 16.9 13.0 - 18.0 g/dL   HCT 50.6 40.0 - 52.0 %   MCV 91.3 80.0 - 100.0 fL   MCH 30.5 26.0 - 34.0 pg   MCHC 33.4 32.0 - 36.0 g/dL   RDW 13.5 11.5 - 14.5 %   Platelets 184 150 - 440 K/uL  Creatinine, serum  Result Value Ref Range   Creatinine, Ser 0.99 0.61 - 1.24 mg/dL   GFR calc non Af Amer >60 >60 mL/min   GFR calc Af Amer >60 >60 mL/min  Lipid panel  Result Value Ref Range   Cholesterol 118 0 - 200 mg/dL   Triglycerides 186 (H) <150 mg/dL   HDL 36 (L) >40 mg/dL   Total CHOL/HDL Ratio 3.3 RATIO   VLDL 37 0 - 40 mg/dL  LDL Cholesterol 45 0 - 99 mg/dL  POCT Activated clotting time  Result Value Ref Range   Activated Clotting Time 395 seconds      Assessment & Plan:   Problem List Items Addressed This Visit      Cardiovascular and Mediastinum   Essential hypertension - Primary    The current medical regimen is effective;  continue present plan and medications.       Relevant Orders   Basic metabolic panel       Follow up plan: Return in about 6 months (around 11/20/2018) for Physical Exam.

## 2018-05-22 NOTE — Assessment & Plan Note (Signed)
The current medical regimen is effective;  continue present plan and medications.  

## 2018-05-23 ENCOUNTER — Encounter: Payer: Self-pay | Admitting: Family Medicine

## 2018-05-23 LAB — BASIC METABOLIC PANEL
BUN/Creatinine Ratio: 21 (ref 10–24)
BUN: 23 mg/dL (ref 8–27)
CO2: 22 mmol/L (ref 20–29)
CREATININE: 1.09 mg/dL (ref 0.76–1.27)
Calcium: 9.1 mg/dL (ref 8.6–10.2)
Chloride: 101 mmol/L (ref 96–106)
GFR calc Af Amer: 81 mL/min/{1.73_m2} (ref >59)
GFR calc non Af Amer: 70 mL/min/{1.73_m2} (ref >59)
GLUCOSE: 118 mg/dL — AB (ref 65–99)
Potassium: 3.7 mmol/L (ref 3.5–5.2)
SODIUM: 130 mmol/L — AB (ref 134–144)

## 2018-06-08 NOTE — Progress Notes (Signed)
[facility]  [phone] [phone]  ID: [name] OB: [birth year]  MR#: [id number]  OXB#:[id number]  Patient Care Team: Guadalupe Maple, MD as PCP - General (Family Medicine) Guadalupe Maple, MD as PCP - Family Medicine (Family Medicine) Brendolyn Patty, MD (Dermatology) Earnestine Leys, MD (Specialist) Dionisio David, MD as Consulting Physician (Cardiology) Troxler, Adele Schilder as Attending Physician (Podiatry)  CHIEF COMPLAINT: Secondary polycythemia.  INTERVAL HISTORY: Patient returns to clinic today for repeat laboratory work, further evaluation, and consideration of phlebotomy.  He continues to take testosterone injections regularly approximately every 10 days.  He continues to have chronic knee pain and reports he will likely have to have a knee replacement in the near future.  Despite this, he remains active cycling 100s of miles per week. He has no neurologic complaints. Patient denies any recent fevers or illnesses. He has a good appetite and denies weight loss. He has no chest pain or shortness of breath. He denies any nausea, vomiting, constipation, or diarrhea. He has no urinary complaints.  Patient feels at his baseline offers no further specific complaints today.  REVIEW OF SYSTEMS:   Review of Systems  Constitutional: Negative.  Negative for fever, malaise/fatigue and weight loss.  Respiratory: Negative.  Negative for cough and shortness of breath.   Cardiovascular: Negative.  Negative for chest pain and leg swelling.  Gastrointestinal: Negative.  Negative for abdominal pain, blood in stool and melena.  Genitourinary: Negative.  Negative for dysuria.  Musculoskeletal: Positive for joint pain. Negative for back pain.  Skin: Negative.  Negative for rash.  Neurological: Negative.  Negative for sensory change, focal weakness and weakness.  Psychiatric/Behavioral: Negative.  The patient is not nervous/anxious.     As per HPI.  Otherwise, a complete review of systems is negative.  PAST MEDICAL HISTORY: Past Medical History:  Diagnosis Date  . Coronary artery disease   . Depression   . Hyperlipidemia     PAST SURGICAL HISTORY: Past Surgical History:  Procedure Laterality Date  . CARDIAC CATHETERIZATION N/A 04/26/2015   Procedure: Left Heart Cath;  Surgeon: Dionisio David, MD;  Location: [facility];  Service: Cardiovascular;  Laterality: N/A;  . CORONARY ANGIOPLASTY    . CORONARY STENT INTERVENTION N/A 03/27/2018   Procedure: CORONARY STENT INTERVENTION;  Surgeon: Yolonda Kida, MD;  Location: [facility];  Service: Cardiovascular;  Laterality: N/A;  . EYE SURGERY Bilateral    cataract  . LEFT HEART CATH AND CORONARY ANGIOGRAPHY Left 03/27/2018   Procedure: Left Heart Cath with possible coronary intervention;  Surgeon: Dionisio David, MD;  Location: [facility];  Service: Cardiovascular;  Laterality: Left;    FAMILY HISTORY: Family History  Problem Relation Age of Onset  . Diabetes Mother   . Hypertension Mother   . Cancer Father   . Depression Father   . Anxiety disorder Father   . Depression Daughter     ADVANCED DIRECTIVES (Y/N):  N  HEALTH MAINTENANCE: Social History   Tobacco Use  . Smoking status: Former Smoker    Years: 30.00    Types: Cigars    Last attempt to quit: 07/18/2006    Years since quitting: 11.9  . Smokeless tobacco: Never Used  Substance Use Topics  . Alcohol use: No  . Drug use: No     Colonoscopy:  PAP:  Bone density:  Lipid panel:  Allergies  Allergen Reactions  . Clonidine Derivatives Shortness Of Breath and  Other (See Comments)    Low heart rate    Current Outpatient Medications  Medication Sig Dispense Refill  . amLODipine (NORVASC) 10 MG tablet Take 1 tablet (10 mg total) by mouth daily. 90 tablet 4  . aspirin EC 81 MG EC tablet Take 1 tablet (81 mg total) by mouth daily.    . Cholecalciferol ([employer]S VIT D 5000  HIGH-POTENCY) 5000 units capsule Take 5,000 Units by mouth daily.    . clopidogrel (PLAVIX) 75 MG tablet Take 75 mg by mouth daily.    Marland Kitchen dutasteride (AVODART) 0.5 MG capsule Take 1 capsule (0.5 mg total) by mouth every evening.    . escitalopram (LEXAPRO) 10 MG tablet Take 1.5 tablets (15 mg total) by mouth at bedtime. 45 tablet 2  . fluticasone (FLONASE) 50 MCG/ACT nasal spray USE 2 SPRAYS IN EACH NOSTRIL DAILY (Patient taking differently: Use 1 spray in each nostril daily as needed for allergies) 16 g 6  . hydrALAZINE (APRESOLINE) 25 MG tablet Take 25 mg by mouth daily.    . hydrochlorothiazide (HYDRODIURIL) 25 MG tablet Take 1 tablet (25 mg total) by mouth daily. 90 tablet 3  . losartan (COZAAR) 100 MG tablet Take 1 tablet (100 mg total) by mouth every evening.    . meloxicam (MOBIC) 15 MG tablet Take 1 tablet (15 mg total) by mouth at bedtime. 30 tablet 6  . mupirocin ointment (BACTROBAN) 2 % APPLY AS DIRECTED (Patient taking differently: Apply topically daily as needed for irritation) 22 g 1  . niacin (NIASPAN) 1000 MG CR tablet Take 2 tablets (2,000 mg total) by mouth every evening.    . Omega-3 Fatty Acids (FISH OIL) 1000 MG CAPS Take 1,000 capsules by mouth daily.     . pantoprazole (PROTONIX) 20 MG tablet Take 20 mg by mouth daily.     Marland Kitchen PROCTO-MED HC 2.5 % rectal cream APPLY RECTALLY TWICE DAILY AS DIRECTED (Patient taking differently: Apply rectally twice daily as needed for irritation) 28.35 g 1  . rosuvastatin (CRESTOR) 20 MG tablet Take 1 tablet (20 mg total) by mouth every evening. 30 tablet 0  . tamsulosin (FLOMAX) 0.4 MG CAPS capsule Take 1 capsule (0.4 mg total) by mouth daily. 90 capsule 4  . testosterone cypionate (DEPOTESTOSTERONE CYPIONATE) 200 MG/ML injection INJECT 0.75 ML INTRAMUSCULARLY EVERY 10 DAYS (Patient taking differently: INJECT 150 MG INTRAMUSCULARLY EVERY 10 DAYS) 10 mL 3  . vitamin B-12 (CYANOCOBALAMIN) 1000 MCG tablet Take 1,000 mcg by mouth daily.    Marland Kitchen  buPROPion (WELLBUTRIN) 100 MG tablet Take 1 tablet (100 mg total) by mouth daily. 90 tablet 0  . hydrOXYzine (VISTARIL) 25 MG capsule Take 1 capsule (25 mg total) by mouth 2 (two) times daily as needed for anxiety (only for severe anxiety sx and also for sleep). 60 capsule 1   No current facility-administered medications for this visit.     OBJECTIVE: Vitals:   06/09/18 0843  BP: 138/78  Pulse: 64  Resp: 18  Temp: (!) 95 F (35 C)     Body mass index is 33.12 kg/m.    ECOG FS:0 - Asymptomatic  General: Well-developed, well-nourished, no acute distress. Eyes: Pink conjunctiva, anicteric sclera. HEENT: Normocephalic, moist mucous membranes. Lungs: Clear to auscultation bilaterally. Heart: Regular rate and rhythm. No rubs, murmurs, or gallops. Abdomen: Soft, nontender, nondistended. No organomegaly noted, normoactive bowel sounds. Musculoskeletal: No edema, cyanosis, or clubbing. Neuro: Alert, answering all questions appropriately. Cranial nerves grossly intact. Skin: No rashes or petechiae noted. Psych:  Normal affect.  LAB RESULTS:  Lab Results  Component Value Date   NA 130 (L) 05/22/2018   K 3.7 05/22/2018   CL 101 05/22/2018   CO2 22 05/22/2018   GLUCOSE 118 (H) 05/22/2018   BUN 23 05/22/2018   CREATININE 1.09 05/22/2018   CALCIUM 9.1 05/22/2018   PROT 6.6 10/11/2017   ALBUMIN 4.5 10/11/2017   AST 22 10/11/2017   ALT 23 10/11/2017   ALKPHOS 53 10/11/2017   BILITOT 0.6 10/11/2017   GFRNONAA 70 05/22/2018   GFRAA 81 05/22/2018    Lab Results  Component Value Date   WBC 4.1 06/09/2018   NEUTROABS 2.6 06/09/2018   HGB 17.0 06/09/2018   HCT 50.1 06/09/2018   MCV 90.0 06/09/2018   PLT 190 06/09/2018     STUDIES: No results found.  ASSESSMENT: Secondary polycythemia  PLAN:    1. 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  Patient's hemoglobin is relatively stable and unchanged at 17.0.  Previously, it was agreed upon that his goal hemoglobin  to be less than 17.0.  After discussion with the patient, it was decided not to pursue 500 mL phlebotomy today.  Patient expressed understanding that as long as he requires testosterone, he likely will require periodic phlebotomies.  Return to clinic in 3 months with repeat laboratory work and consideration of phlebotomy and then in 6 months for repeat laboratory work and further evaluation.   2. Low testosterone: Patient reports he takes testosterone injections approximately every 10 days.  Continue to treatment with testosterone per urology. 3.  Hypertension: Patient's blood pressure is within normal limits today.  I spent a total of 20 minutes face-to-face with the patient of which greater than 50% of the visit was spent in counseling and coordination of care as detailed above.   Patient expressed understanding and was in agreement with this plan. He also understands that He can call clinic at any time with any questions, concerns, or complaints.    Lloyd Huger, MD   06/09/2018 4:15 PM

## 2018-06-09 ENCOUNTER — Inpatient Hospital Stay (HOSPITAL_BASED_OUTPATIENT_CLINIC_OR_DEPARTMENT_OTHER): Payer: Medicare Other | Admitting: Oncology

## 2018-06-09 ENCOUNTER — Inpatient Hospital Stay: Payer: Medicare Other | Attending: Oncology

## 2018-06-09 ENCOUNTER — Ambulatory Visit: Payer: Medicare Other | Admitting: Psychiatry

## 2018-06-09 ENCOUNTER — Encounter: Payer: Self-pay | Admitting: Psychiatry

## 2018-06-09 ENCOUNTER — Inpatient Hospital Stay: Payer: Medicare Other

## 2018-06-09 ENCOUNTER — Other Ambulatory Visit: Payer: Self-pay

## 2018-06-09 VITALS — BP 138/78 | HR 64 | Temp 95.0°F | Resp 18 | Wt 251.0 lb

## 2018-06-09 VITALS — BP 149/76 | HR 76 | Temp 97.5°F | Wt 232.8 lb

## 2018-06-09 DIAGNOSIS — I10 Essential (primary) hypertension: Secondary | ICD-10-CM

## 2018-06-09 DIAGNOSIS — D751 Secondary polycythemia: Secondary | ICD-10-CM | POA: Insufficient documentation

## 2018-06-09 DIAGNOSIS — F330 Major depressive disorder, recurrent, mild: Secondary | ICD-10-CM

## 2018-06-09 DIAGNOSIS — F5105 Insomnia due to other mental disorder: Secondary | ICD-10-CM | POA: Diagnosis not present

## 2018-06-09 DIAGNOSIS — E291 Testicular hypofunction: Secondary | ICD-10-CM

## 2018-06-09 DIAGNOSIS — F411 Generalized anxiety disorder: Secondary | ICD-10-CM | POA: Diagnosis not present

## 2018-06-09 LAB — CBC WITH DIFFERENTIAL/PLATELET
BASOS ABS: 0 10*3/uL (ref 0–0.1)
Basophils Relative: 1 %
EOS ABS: 0.1 10*3/uL (ref 0–0.7)
Eosinophils Relative: 3 %
HCT: 50.1 % (ref 40.0–52.0)
HEMOGLOBIN: 17 g/dL (ref 13.0–18.0)
LYMPHS PCT: 24 %
Lymphs Abs: 1 10*3/uL (ref 1.0–3.6)
MCH: 30.5 pg (ref 26.0–34.0)
MCHC: 33.8 g/dL (ref 32.0–36.0)
MCV: 90 fL (ref 80.0–100.0)
Monocytes Absolute: 0.4 10*3/uL (ref 0.2–1.0)
Monocytes Relative: 10 %
NEUTROS PCT: 62 %
Neutro Abs: 2.6 10*3/uL (ref 1.4–6.5)
Platelets: 190 10*3/uL (ref 150–440)
RBC: 5.57 MIL/uL (ref 4.40–5.90)
RDW: 15.3 % — ABNORMAL HIGH (ref 11.5–14.5)
WBC: 4.1 10*3/uL (ref 3.8–10.6)

## 2018-06-09 MED ORDER — BUPROPION HCL 100 MG PO TABS: 100.0000 mg | ORAL_TABLET | Freq: Every day | ORAL | 0 refills | Status: DC

## 2018-06-09 NOTE — Progress Notes (Signed)
Here for follow up. Per pt had cath and stents x1 in July. Has been cycling ( 65 miles on Sat alone   ,240 miles last week )

## 2018-06-09 NOTE — Progress Notes (Signed)
[facility] MD  OP Progress Note  06/09/2018 2:59 PM Kenneth Pruitt  MRN:  [id number]  Chief Complaint: ' I am here for follow up.' Chief Complaint    Follow-up; Medication Refill     HPI: Kenneth Pruitt is a 67 old Caucasian male, retired, married, lives in [place], has a history of depression, anxiety, coronary artery disease, knee pain, testosterone deficiency, presented to the clinic today for a follow-up visit.  Patient recently had stent placement on 03/27/2018.  Patient today reports he is tolerating the Wellbutrin well.  He has noticed some benefit from the medication.  He reports he is not sleeping as much during the day as he used to before.  He has also noticed some improvement in his mood symptoms.  He however reports he is not where he wants to be mood wise.  He denies any side effects to the medication.  He would like to increase the dosage.  Patient continues to be compliant with his Lexapro.  Patient reports he continues to be in psychotherapy sessions which are going well.  Patient reports he is scheduled to see his knee specialist this week.  Patient reports he may need another knee surgery soon.  Patient reports he looks forward to the cycling ride of [place] which is going to take place this weekend and all through  next week.  Patient reports he is going to start his ride at [employer] and then cycle all the way to [facility].  Patient reports he is very passionate about this and looks forward to the same.  Patient reports cycling rides are always therapeutic for him and he wants to make sure he does that as long as he can.   Visit Diagnosis:    ICD-10-CM   1. MDD (major depressive disorder), recurrent episode, mild (HCC) F33.0 buPROPion (WELLBUTRIN) 100 MG tablet  2. GAD (generalized anxiety disorder) F41.1   3. Insomnia due to mental disorder F51.05     Past Psychiatric History: Reviewed past psychiatric history from my progress note on 12/16/2017  Past Medical  History:  Past Medical History:  Diagnosis Date  . Coronary artery disease   . Depression   . Hyperlipidemia     Past Surgical History:  Procedure Laterality Date  . CARDIAC CATHETERIZATION N/A 04/26/2015   Procedure: Left Heart Cath;  Surgeon: Dionisio David, MD;  Location: [facility];  Service: Cardiovascular;  Laterality: N/A;  . CORONARY ANGIOPLASTY    . CORONARY STENT INTERVENTION N/A 03/27/2018   Procedure: CORONARY STENT INTERVENTION;  Surgeon: Yolonda Kida, MD;  Location: [facility];  Service: Cardiovascular;  Laterality: N/A;  . EYE SURGERY Bilateral    cataract  . LEFT HEART CATH AND CORONARY ANGIOGRAPHY Left 03/27/2018   Procedure: Left Heart Cath with possible coronary intervention;  Surgeon: Dionisio David, MD;  Location: [facility];  Service: Cardiovascular;  Laterality: Left;    Family Psychiatric History: Reviewed family psychiatric history from my progress note on 12/16/2017 Family History:  Family History  Problem Relation Age of Onset  . Diabetes Mother   . Hypertension Mother   . Cancer Father   . Depression Father   . Anxiety disorder Father   . Depression Daughter     Social History: Have reviewed social history from my progress note on 12/16/2017 Social History   Socioeconomic History  . Marital status: Married    Spouse name: ann  . Number of children: 2  .  Years of education: Not on file  . Highest education level: Master's degree (e.g., MA, MS, MEng, MEd, MSW, MBA)  Occupational History    Comment: retired  Scientific laboratory technician  . Financial resource strain: Not hard at all  . Food insecurity:    Worry: Never true    Inability: Never true  . Transportation needs:    Medical: No    Non-medical: No  Tobacco Use  . Smoking status: Former Smoker    Years: 30.00    Types: Cigars    Last attempt to quit: 07/18/2006    Years since quitting: 11.9  . Smokeless tobacco: Never Used  Substance and Sexual Activity  . Alcohol  use: No  . Drug use: No  . Sexual activity: Not Currently  Lifestyle  . Physical activity:    Days per week: 0 days    Minutes per session: 0 min  . Stress: Not at all  Relationships  . Social connections:    Talks on phone: More than three times a week    Gets together: More than three times a week    Attends religious service: More than 4 times per year    Active member of club or organization: Yes    Attends meetings of clubs or organizations: More than 4 times per year    Relationship status: Married  Other Topics Concern  . Not on file  Social History Narrative  . Not on file    Allergies:  Allergies  Allergen Reactions  . Clonidine Derivatives Shortness Of Breath and Other (See Comments)    Low heart rate    Metabolic Disorder Labs: No results found for: HGBA1C, MPG No results found for: PROLACTIN Lab Results  Component Value Date   CHOL 118 03/28/2018   TRIG 186 (H) 03/28/2018   HDL 36 (L) 03/28/2018   CHOLHDL 3.3 03/28/2018   VLDL 37 03/28/2018   LDLCALC 45 03/28/2018   LDLCALC 41 10/11/2017   Lab Results  Component Value Date   TSH 1.030 02/05/2017   TSH 1.270 02/02/2016    Therapeutic Level Labs: No results found for: LITHIUM No results found for: VALPROATE No components found for:  CBMZ  Current Medications: Current Outpatient Medications  Medication Sig Dispense Refill  . amLODipine (NORVASC) 10 MG tablet Take 1 tablet (10 mg total) by mouth daily. 90 tablet 4  . aspirin EC 81 MG EC tablet Take 1 tablet (81 mg total) by mouth daily.    . Cholecalciferol (CVS VIT D 5000 HIGH-POTENCY) 5000 units capsule Take 5,000 Units by mouth daily.    . clopidogrel (PLAVIX) 75 MG tablet Take 75 mg by mouth daily.    Marland Kitchen dutasteride (AVODART) 0.5 MG capsule Take 1 capsule (0.5 mg total) by mouth every evening.    . escitalopram (LEXAPRO) 10 MG tablet Take 1.5 tablets (15 mg total) by mouth at bedtime. 45 tablet 2  . fluticasone (FLONASE) 50 MCG/ACT nasal spray  USE 2 SPRAYS IN EACH NOSTRIL DAILY (Patient taking differently: Use 1 spray in each nostril daily as needed for allergies) 16 g 6  . hydrALAZINE (APRESOLINE) 25 MG tablet Take 25 mg by mouth daily.    . hydrochlorothiazide (HYDRODIURIL) 25 MG tablet Take 1 tablet (25 mg total) by mouth daily. 90 tablet 3  . hydrOXYzine (VISTARIL) 25 MG capsule Take 1 capsule (25 mg total) by mouth 2 (two) times daily as needed for anxiety (only for severe anxiety sx and also for sleep). 60 capsule 1  .  losartan (COZAAR) 100 MG tablet Take 1 tablet (100 mg total) by mouth every evening.    . meloxicam (MOBIC) 15 MG tablet Take 1 tablet (15 mg total) by mouth at bedtime. 30 tablet 6  . mupirocin ointment (BACTROBAN) 2 % APPLY AS DIRECTED (Patient taking differently: Apply topically daily as needed for irritation) 22 g 1  . niacin (NIASPAN) 1000 MG CR tablet Take 2 tablets (2,000 mg total) by mouth every evening.    . Omega-3 Fatty Acids (FISH OIL) 1000 MG CAPS Take 1,000 capsules by mouth daily.     . pantoprazole (PROTONIX) 20 MG tablet Take 20 mg by mouth daily.     Marland Kitchen PROCTO-MED HC 2.5 % rectal cream APPLY RECTALLY TWICE DAILY AS DIRECTED (Patient taking differently: Apply rectally twice daily as needed for irritation) 28.35 g 1  . rosuvastatin (CRESTOR) 20 MG tablet Take 1 tablet (20 mg total) by mouth every evening. 30 tablet 0  . tamsulosin (FLOMAX) 0.4 MG CAPS capsule Take 1 capsule (0.4 mg total) by mouth daily. 90 capsule 4  . testosterone cypionate (DEPOTESTOSTERONE CYPIONATE) 200 MG/ML injection INJECT 0.75 ML INTRAMUSCULARLY EVERY 10 DAYS (Patient taking differently: INJECT 150 MG INTRAMUSCULARLY EVERY 10 DAYS) 10 mL 3  . vitamin B-12 (CYANOCOBALAMIN) 1000 MCG tablet Take 1,000 mcg by mouth daily.    Marland Kitchen buPROPion (WELLBUTRIN) 100 MG tablet Take 1 tablet (100 mg total) by mouth daily. 90 tablet 0   No current facility-administered medications for this visit.      Musculoskeletal: Strength & Muscle Tone:  within normal limits Gait & Station: normal Patient leans: N/A  Psychiatric Specialty Exam: Review of Systems  Psychiatric/Behavioral: Positive for depression (improving). The patient is nervous/anxious.   All other systems reviewed and are negative.   Blood pressure (!) 149/76, pulse 76, temperature (!) 97.5 F (36.4 C), temperature source Oral, weight 232 lb 12.8 oz (105.6 kg).Body mass index is 30.71 kg/m.  General Appearance: Casual  Eye Contact:  Fair  Speech:  Clear and Coherent  Volume:  Normal  Mood:  Dysphoric  Affect:  Congruent  Thought Process:  Goal Directed and Descriptions of Associations: Intact  Orientation:  Full (Time, Place, and Person)  Thought Content: Logical   Suicidal Thoughts:  No  Homicidal Thoughts:  No  Memory:  Immediate;   Fair Recent;   Fair Remote;   Fair  Judgement:  Fair  Insight:  Fair  Psychomotor Activity:  Normal  Concentration:  Concentration: Fair and Attention Span: Fair  Recall:  [employer] of Knowledge: Fair  Language: Fair  Akathisia:  No  Handed:  Right  AIMS (if indicated): na  Assets:  Communication Skills Desire for Improvement Social Support  ADL's:  Intact  Cognition: WNL  Sleep:  Fair   Screenings: PHQ2-9     Clinical Support from 10/11/2017 in [facility] Visit from 03/12/2017 in [facility] Visit from 02/02/2016 in [facility]  PHQ-2 Total Score  6  1  1   PHQ-9 Total Score  24  -  -       Assessment and Plan: Jermarion is a 67 year old Caucasian male who has a history of depression, anxiety, multiple medical problems including essential hypertension, coronary artery disease, status post stent placement, hyperlipidemia, BPH, knee pain, testosterone deficiency, presented to the clinic today for a follow-up visit.  Patient with recent inpatient hospital admission for cardiac problems, stent placement is currently recovering from the same.  He reports improvement  in his  mood symptoms however is interested in the dosage increase in Wellbutrin.  He denies any side effects to the medication.  Plan as noted below.  Plan MDD Increase Wellbutrin to 100 mg p.o. daily. Continue Lexapro at the reduced dosage of 15 mg p.o. Daily.  GAD Lexapro reduced dosage of 15 mg Hydroxyzine 25 mg p.o. twice daily as needed only for severe attacks of anxiety symptoms.  Patient will continue psychotherapy sessions with [name].  Follow-up in clinic in 1 month or sooner if needed.  More than 50 % of the time was spent for psychoeducation and supportive psychotherapy and care coordination.  This note was generated in part or whole with voice recognition software. Voice recognition is usually quite accurate but there are transcription errors that can and very often do occur. I apologize for any typographical errors that were not detected and corrected.         Ursula Alert, MD 06/09/2018, 2:59 PM

## 2018-06-24 NOTE — Progress Notes (Signed)
   THERAPIST PROGRESS NOTE  Session Time: 15min  Participation Level: Active  Behavioral Response: CasualAlertEuthymic  Type of Therapy: Individual Therapy  Treatment Goals addressed: Coping  Interventions: CBT and Motivational Interviewing  Summary: Kenneth Pruitt is a 66 y.o. male who presents with a reduction in symptoms.  Patient reports a "favorable" mood.  Patient states that he was given several medical diagnosis that has left him feeling down.  Reports that he wants to cycle the state but is unsure how his body will manage. Discussion on relaxation and using small steps to gain big rewards.    Suicidal/Homicidal: No   Plan: Return again in 2 weeks.  Diagnosis: Axis I: Depression,mild    Axis II: No diagnosis    Lubertha South, LCSW 05/14/2018

## 2018-06-25 ENCOUNTER — Telehealth: Payer: Self-pay

## 2018-06-25 NOTE — Telephone Encounter (Signed)
Called and left patient a VM (signed DPR) asking for him to please return my call to schedule surgery clearance appointment. Surgery is scheduled for 10/08/18 and clearance form placed in basket between [facility] and [employer].

## 2018-07-01 NOTE — Telephone Encounter (Signed)
Please call and schedule patient surgery clearance. Thanks.

## 2018-07-02 NOTE — Telephone Encounter (Signed)
Appt scheduled

## 2018-07-03 ENCOUNTER — Ambulatory Visit: Payer: Self-pay | Admitting: Orthopedic Surgery

## 2018-07-09 ENCOUNTER — Encounter: Payer: Self-pay | Admitting: Psychiatry

## 2018-07-09 ENCOUNTER — Ambulatory Visit (INDEPENDENT_AMBULATORY_CARE_PROVIDER_SITE_OTHER): Payer: Medicare Other | Admitting: Psychiatry

## 2018-07-09 ENCOUNTER — Other Ambulatory Visit: Payer: Self-pay

## 2018-07-09 VITALS — BP 130/70 | HR 86 | Temp 97.5°F | Wt 231.6 lb

## 2018-07-09 DIAGNOSIS — F330 Major depressive disorder, recurrent, mild: Secondary | ICD-10-CM

## 2018-07-09 DIAGNOSIS — F411 Generalized anxiety disorder: Secondary | ICD-10-CM | POA: Diagnosis not present

## 2018-07-09 MED ORDER — ESCITALOPRAM OXALATE 10 MG PO TABS: 15.0000 mg | ORAL_TABLET | Freq: Every day | ORAL | 1 refills | Status: DC

## 2018-07-09 NOTE — Progress Notes (Signed)
[facility] MD OP Progress Note  07/09/2018 3:37 PM [name]  MRN:  [id number]  Chief Complaint:  Chief Complaint    Follow-up; Medication Refill    ' I am here for follow up.'  HPI: Kenneth Pruitt is a 67 yr old Caucasian male, retired, married, lives in [place], has a history of depression, anxiety, coronary artery disease, recent stent placement , knee pain, testosterone deficiency, presented to the clinic today for a follow-up visit.    Patient today reports he is currently doing well with regards to his mood symptoms.  He is tolerating the increased dosage of Wellbutrin well.  He reports his mood symptoms as better.  He reports this is the best he has felt in a long time.  He reports sleep is good.  He continues to be compliant with the Lexapro which is now 15 mg.  He reports he went for the [place] bike ride but participated only for 3 days.  He reports the weather was too hot and it was not very comfortable sleeping outside in a tent and his knees started bothering him.  He hence called his wife who picked him up.  He reports he does not regret it since he still enjoyed participating.  This is the 10th time he has done this bike ride.  He reports he always enjoys it.  He denies any other concerns today.  He reports his knee surgery has been rescheduled for January 2020 since he recently had stent placement.  He was advised to wait at least 6 months before getting his surgery done.  He reports he just wants to follow the recommendations from his providers and wants to do what is right.  He continues to be physically active as much as he can.   Visit Diagnosis:    ICD-10-CM   1. MDD (major depressive disorder), recurrent episode, mild (HCC) F33.0 escitalopram (LEXAPRO) 10 MG tablet  2. GAD (generalized anxiety disorder) F41.1 escitalopram (LEXAPRO) 10 MG tablet    Past Psychiatric History: Have reviewed past psychiatric history from my progress note on 12/16/2017  Past Medical  History:  Past Medical History:  Diagnosis Date  . Coronary artery disease   . Depression   . Hyperlipidemia     Past Surgical History:  Procedure Laterality Date  . CARDIAC CATHETERIZATION N/A 04/26/2015   Procedure: Left Heart Cath;  Surgeon: Dionisio David, MD;  Location: [facility];  Service: Cardiovascular;  Laterality: N/A;  . CORONARY ANGIOPLASTY    . CORONARY STENT INTERVENTION N/A 03/27/2018   Procedure: CORONARY STENT INTERVENTION;  Surgeon: Yolonda Kida, MD;  Location: [facility];  Service: Cardiovascular;  Laterality: N/A;  . EYE SURGERY Bilateral    cataract  . LEFT HEART CATH AND CORONARY ANGIOGRAPHY Left 03/27/2018   Procedure: Left Heart Cath with possible coronary intervention;  Surgeon: Dionisio David, MD;  Location: [facility];  Service: Cardiovascular;  Laterality: Left;    Family Psychiatric History: Reviewed family psychiatric history from my progress note on 12/16/2017.  Family History:  Family History  Problem Relation Age of Onset  . Diabetes Mother   . Hypertension Mother   . Cancer Father   . Depression Father   . Anxiety disorder Father   . Depression Daughter     Social History: Reviewed social history from my progress note on 12/16/2017. Social History   Socioeconomic History  . Marital status: Married    Spouse name: ann  .  Number of children: 2  . Years of education: Not on file  . Highest education level: Master's degree (e.g., MA, MS, MEng, MEd, MSW, MBA)  Occupational History    Comment: retired  Scientific laboratory technician  . Financial resource strain: Not hard at all  . Food insecurity:    Worry: Never true    Inability: Never true  . Transportation needs:    Medical: No    Non-medical: No  Tobacco Use  . Smoking status: Former Smoker    Years: 30.00    Types: Cigars    Last attempt to quit: 07/18/2006    Years since quitting: 11.9  . Smokeless tobacco: Never Used  Substance and Sexual Activity  . Alcohol  use: No  . Drug use: No  . Sexual activity: Not Currently  Lifestyle  . Physical activity:    Days per week: 0 days    Minutes per session: 0 min  . Stress: Not at all  Relationships  . Social connections:    Talks on phone: More than three times a week    Gets together: More than three times a week    Attends religious service: More than 4 times per year    Active member of club or organization: Yes    Attends meetings of clubs or organizations: More than 4 times per year    Relationship status: Married  Other Topics Concern  . Not on file  Social History Narrative  . Not on file    Allergies:  Allergies  Allergen Reactions  . Clonidine Derivatives Shortness Of Breath and Other (See Comments)    Low heart rate    Metabolic Disorder Labs: No results found for: HGBA1C, MPG No results found for: PROLACTIN Lab Results  Component Value Date   CHOL 118 03/28/2018   TRIG 186 (H) 03/28/2018   HDL 36 (L) 03/28/2018   CHOLHDL 3.3 03/28/2018   VLDL 37 03/28/2018   LDLCALC 45 03/28/2018   LDLCALC 41 10/11/2017   Lab Results  Component Value Date   TSH 1.030 02/05/2017   TSH 1.270 02/02/2016    Therapeutic Level Labs: No results found for: LITHIUM No results found for: VALPROATE No components found for:  CBMZ  Current Medications: Current Outpatient Medications  Medication Sig Dispense Refill  . amLODipine (NORVASC) 10 MG tablet Take 1 tablet (10 mg total) by mouth daily. 90 tablet 4  . aspirin EC 81 MG EC tablet Take 1 tablet (81 mg total) by mouth daily.    Marland Kitchen buPROPion (WELLBUTRIN) 100 MG tablet Take 1 tablet (100 mg total) by mouth daily. 90 tablet 0  . Cholecalciferol ([employer] VIT D 5000 HIGH-POTENCY) 5000 units capsule Take 5,000 Units by mouth daily.    . clopidogrel (PLAVIX) 75 MG tablet Take 75 mg by mouth daily.    Marland Kitchen dutasteride (AVODART) 0.5 MG capsule Take 1 capsule (0.5 mg total) by mouth every evening.    . escitalopram (LEXAPRO) 10 MG tablet Take 1.5  tablets (15 mg total) by mouth at bedtime. 135 tablet 1  . fluticasone (FLONASE) 50 MCG/ACT nasal spray USE 2 SPRAYS IN EACH NOSTRIL DAILY (Patient taking differently: Use 1 spray in each nostril daily as needed for allergies) 16 g 6  . hydrALAZINE (APRESOLINE) 25 MG tablet Take 25 mg by mouth daily.    . hydrochlorothiazide (HYDRODIURIL) 25 MG tablet Take 1 tablet (25 mg total) by mouth daily. 90 tablet 3  . hydrOXYzine (VISTARIL) 25 MG capsule Take 1 capsule (  25 mg total) by mouth 2 (two) times daily as needed for anxiety (only for severe anxiety sx and also for sleep). 60 capsule 1  . losartan (COZAAR) 100 MG tablet Take 1 tablet (100 mg total) by mouth every evening.    . meloxicam (MOBIC) 15 MG tablet Take 1 tablet (15 mg total) by mouth at bedtime. 30 tablet 6  . mupirocin ointment (BACTROBAN) 2 % APPLY AS DIRECTED (Patient taking differently: Apply topically daily as needed for irritation) 22 g 1  . niacin (NIASPAN) 1000 MG CR tablet Take 2 tablets (2,000 mg total) by mouth every evening.    . Omega-3 Fatty Acids (FISH OIL) 1000 MG CAPS Take 1,000 capsules by mouth daily.     . pantoprazole (PROTONIX) 20 MG tablet Take 20 mg by mouth daily.     Marland Kitchen PROCTO-MED HC 2.5 % rectal cream APPLY RECTALLY TWICE DAILY AS DIRECTED (Patient taking differently: Apply rectally twice daily as needed for irritation) 28.35 g 1  . rosuvastatin (CRESTOR) 20 MG tablet Take 1 tablet (20 mg total) by mouth every evening. 30 tablet 0  . tamsulosin (FLOMAX) 0.4 MG CAPS capsule Take 1 capsule (0.4 mg total) by mouth daily. 90 capsule 4  . testosterone cypionate (DEPOTESTOSTERONE CYPIONATE) 200 MG/ML injection INJECT 0.75 ML INTRAMUSCULARLY EVERY 10 DAYS (Patient taking differently: INJECT 150 MG INTRAMUSCULARLY EVERY 10 DAYS) 10 mL 3  . vitamin B-12 (CYANOCOBALAMIN) 1000 MCG tablet Take 1,000 mcg by mouth daily.    . hydrALAZINE (APRESOLINE) 50 MG tablet      No current facility-administered medications for this visit.       Musculoskeletal: Strength & Muscle Tone: within normal limits Gait & Station: normal Patient leans: N/A  Psychiatric Specialty Exam: Review of Systems  Psychiatric/Behavioral: Positive for depression (improved). The patient is nervous/anxious (improved).   All other systems reviewed and are negative.   Blood pressure 130/70, pulse 86, temperature (!) 97.5 F (36.4 C), temperature source Oral, weight 231 lb 9.6 oz (105.1 kg).Body mass index is 30.56 kg/m.  General Appearance: Casual  Eye Contact:  Fair  Speech:  Normal Rate  Volume:  Normal  Mood:  Euthymic  Affect:  Congruent  Thought Process:  Goal Directed and Descriptions of Associations: Intact  Orientation:  Full (Time, Place, and Person)  Thought Content: Logical   Suicidal Thoughts:  No  Homicidal Thoughts:  No  Memory:  Immediate;   Fair Recent;   Fair Remote;   Fair  Judgement:  Fair  Insight:  Fair  Psychomotor Activity:  Normal  Concentration:  Concentration: Fair and Attention Span: Fair  Recall:  [employer] of Knowledge: Fair  Language: Fair  Akathisia:  No  Handed:  Right  AIMS (if indicated): na  Assets:  Communication Skills Desire for Improvement Social Support  ADL's:  Intact  Cognition: WNL  Sleep:  Fair   Screenings: PHQ2-9     Clinical Support from 10/11/2017 in [facility] Visit from 03/12/2017 in [facility] Visit from 02/02/2016 in [facility]  PHQ-2 Total Score  6  1  1   PHQ-9 Total Score  24  -  -       Assessment and Plan: Jolon is a 67 year old Caucasian male who has a history of depression, anxiety, multiple medical problems including essential hypertension, coronary artery disease, status post stent placement, hyperlipidemia, BPH, knee pain, hyperlipidemia, testosterone deficiency, presented to the clinic today for a follow-up visit.  Patient is currently  doing well on the current medication regimen.  He has noticed  improvement in his mood symptoms on the increased dosage of Wellbutrin.  He will continue medications as noted below.  Plan MDD Wellbutrin 100 mg p.o. daily. Continue Lexapro at the reduced dosage of 15 mg p.o. daily.  For GAD Lexapro at the reduced dosage of 15 mg p.o. daily. Hydroxyzine 25 mg p.o. twice daily as needed for severe anxiety attacks.  Follow-up in clinic in 2 months or sooner if needed.  More than 50 % of the time was spent for psychoeducation and supportive psychotherapy and care coordination.  This note was generated in part or whole with voice recognition software. Voice recognition is usually quite accurate but there are transcription errors that can and very often do occur. I apologize for any typographical errors that were not detected and corrected.          Ursula Alert, MD 07/09/2018, 3:37 PM

## 2018-07-11 ENCOUNTER — Other Ambulatory Visit: Payer: Self-pay | Admitting: Family Medicine

## 2018-07-11 NOTE — Telephone Encounter (Signed)
Requested Prescriptions  Pending Prescriptions Disp Refills  . meloxicam (MOBIC) 15 MG tablet [Pharmacy Med Name: MELOXICAM 15 MG TAB] 30 tablet 5    Sig: TAKE ONE TABLET BY MOUTH EVERY DAY     Analgesics:  COX2 Inhibitors Passed - 07/11/2018 11:21 AM      Passed - HGB in normal range and within 360 days    Hemoglobin  Date Value Ref Range Status  06/09/2018 17.0 13.0 - 18.0 g/dL Final  10/11/2017 18.2 (H) 13.0 - 17.7 g/dL Final         Passed - Cr in normal range and within 360 days    Creatinine, Ser  Date Value Ref Range Status  05/22/2018 1.09 0.76 - 1.27 mg/dL Final         Passed - Patient is not pregnant      Passed - Valid encounter within last 12 months    Recent Outpatient Visits          1 month ago Essential hypertension   [place], Jeannette How, MD   3 months ago Essential hypertension   [facility], Jeannette How, MD   4 months ago Essential hypertension   [place] Crissman, Jeannette How, MD   7 months ago Essential hypertension   [place] Crissman, Jeannette How, MD   8 months ago PE (physical exam), annual   [place], MD      Future Appointments            In 2 months [facility], Jeannette How, MD [facility], [facility]   In 4 months Crissman, Jeannette How, MD [facility], [facility]

## 2018-08-09 ENCOUNTER — Other Ambulatory Visit: Payer: Self-pay | Admitting: Family Medicine

## 2018-08-11 NOTE — Telephone Encounter (Signed)
Requested medication (s) are due for refill today: Yes  Requested medication (s) are on the active medication list: Yes  Last refill:  01/29/18  Future visit scheduled: Yes  Notes to clinic:  Unable to refill per protocol.     Requested Prescriptions  Pending Prescriptions Disp Refills   testosterone cypionate (DEPOTESTOSTERONE CYPIONATE) 200 MG/ML injection [Pharmacy Med Name: TESTOSTERONE CYPIONATE 200 MG/ML IM] 10 mL     Sig: INJECT 0.75 ML INTRAMUSCULARLY EVERY 10 DAYS     Off-Protocol Failed - 08/09/2018 10:09 AM      Failed - Medication not assigned to a protocol, review manually.      Passed - Valid encounter within last 12 months    Recent Outpatient Visits          2 months ago Essential hypertension   [place]iss[place], Jeannette How, MD   4 months ago Essential hypertension   [place] Crissman, Jeannette How, MD   5 months ago Essential hypertension   [place] Crissman, Jeannette How, MD   8 months ago Essential hypertension   [place] Crissman, Jeannette How, MD   9 months ago PE (physical exam), annual   [place], MD      Future Appointments            In 1 month Crissman, Jeannette How, MD [place], [facility]   In 3 months Crissman, Jeannette How, MD [facility], [facility]

## 2018-08-22 ENCOUNTER — Other Ambulatory Visit: Payer: Self-pay | Admitting: Family Medicine

## 2018-08-22 NOTE — Telephone Encounter (Signed)
Requested medication (s) are due for refill today:yes  Requested medication (s) are on the active medication list: yes  Last refill:  11/21/17  Future visit scheduled: yes   Notes to clinic:  Off protocol    Requested Prescriptions  Pending Prescriptions Disp Refills   hydrocortisone (ANUSOL-HC) 2.5 % rectal cream [Pharmacy Med Name: HYDROCORTISONE 2.5% TOP CREAM GM] 30 g     Sig: APPLY RECTALLY TWICE DAILY AS DIRECTED     Off-Protocol Failed - 08/22/2018 10:21 AM      Failed - Medication not assigned to a protocol, review manually.      Passed - Valid encounter within last 12 months    Recent Outpatient Visits          3 months ago Essential hypertension   [place] Crissman, Jeannette How, MD   4 months ago Essential hypertension   [place] Crissman, Jeannette How, MD   6 months ago Essential hypertension   [place] Crissman, Jeannette How, MD   8 months ago Essential hypertension   [place] Criss[facility], Jeannette How, MD   10 months ago PE (physical exam), annual   [place], MD      Future Appointments            In 1 month [facility] Jeannette How, MD [place], [facility]   In 3 months Crissman, Jeannette How, MD [facility], [facility]

## 2018-08-27 ENCOUNTER — Telehealth: Payer: Self-pay | Admitting: Family Medicine

## 2018-08-27 NOTE — Telephone Encounter (Signed)
Spoke to pt he is having knee surgery in Jan would like to wait until further into spring to sched please call in feb or early march

## 2018-09-01 ENCOUNTER — Other Ambulatory Visit: Payer: Self-pay

## 2018-09-01 ENCOUNTER — Ambulatory Visit: Payer: Medicare Other | Admitting: Psychiatry

## 2018-09-01 ENCOUNTER — Encounter: Payer: Self-pay | Admitting: Psychiatry

## 2018-09-01 VITALS — BP 158/82 | HR 76 | Temp 99.0°F | Wt 238.6 lb

## 2018-09-01 DIAGNOSIS — F330 Major depressive disorder, recurrent, mild: Secondary | ICD-10-CM | POA: Diagnosis not present

## 2018-09-01 DIAGNOSIS — F5105 Insomnia due to other mental disorder: Secondary | ICD-10-CM | POA: Diagnosis not present

## 2018-09-01 DIAGNOSIS — F411 Generalized anxiety disorder: Secondary | ICD-10-CM | POA: Diagnosis not present

## 2018-09-01 MED ORDER — BUPROPION HCL 100 MG PO TABS: 100.0000 mg | ORAL_TABLET | Freq: Every day | ORAL | 1 refills | Status: DC

## 2018-09-01 NOTE — Progress Notes (Signed)
Kenneth Pruitt Progress Note  09/01/2018 12:23 PM ADD DINAPOLI  MRN:  [id number]  Chief Complaint: ' I am here for follow up.' Chief Complaint    Follow-up; Medication Refill     HPI: Kenneth Pruitt is a 67 year old Caucasian male, retired, married, lives in [place], has a history of depression, anxiety, coronary artery disease, recent stent placement, knee pain, testosterone deficiency, presented to the clinic today for a follow-up visit.  Patient reports his mood symptoms as fair on the current medication regimen.  He is tolerating the Wellbutrin and the Lexapro well.  He reports sleep is good.  He continues to enjoy his biking.  He reports that helps him a lot.  He continues to have knee pain and has upcoming surgery scheduled for January.  He is trying to exercise as much as he can which helps.  He continues to have psychosocial stressors, relationship conflicts with his mother.  He is the healthcare power of attorney for her and she is in a nursing home.  Patient however reports he has been dealing with this since a long time.  He has good social support from his wife and his sister.  Patient denies any suicidality, homicidality or perceptual disturbances.   Visit Diagnosis:    ICD-10-CM   1. MDD (major depressive disorder), recurrent episode, mild (HCC) F33.0 buPROPion (WELLBUTRIN) 100 MG tablet   improving  2. GAD (generalized anxiety disorder) F41.1    improved  3. Insomnia due to mental disorder F51.05    improved    Past Psychiatric History: Reviewed past psychiatric history from my progress note on 12/16/2017.  Past Medical History:  Past Medical History:  Diagnosis Date  . Coronary artery disease   . Depression   . Hyperlipidemia     Past Surgical History:  Procedure Laterality Date  . CARDIAC CATHETERIZATION N/A 04/26/2015   Procedure: Left Heart Cath;  Surgeon: Dionisio David, MD;  Location: [facility];  Service: Cardiovascular;  Laterality: N/A;  . CORONARY  ANGIOPLASTY    . CORONARY STENT INTERVENTION N/A 03/27/2018   Procedure: CORONARY STENT INTERVENTION;  Surgeon: Yolonda Kida, MD;  Location: [facility];  Service: Cardiovascular;  Laterality: N/A;  . EYE SURGERY Bilateral    cataract  . LEFT HEART CATH AND CORONARY ANGIOGRAPHY Left 03/27/2018   Procedure: Left Heart Cath with possible coronary intervention;  Surgeon: Dionisio David, MD;  Location: [facility];  Service: Cardiovascular;  Laterality: Left;    Family Psychiatric History: I have reviewed family psychiatric history from my progress note on 12/16/2017.  Family History:  Family History  Problem Relation Age of Onset  . Diabetes Mother   . Hypertension Mother   . Cancer Father   . Depression Father   . Anxiety disorder Father   . Depression Daughter     Social History: I have reviewed social history from my progress note on 12/16/2017. Social History   Socioeconomic History  . Marital status: Married    Spouse name: ann  . Number of children: 2  . Years of education: Not on file  . Highest education level: Master's degree (e.g., MA, MS, MEng, MEd, MSW, MBA)  Occupational History    Comment: retired  Scientific laboratory technician  . Financial resource strain: Not hard at all  . Food insecurity:    Worry: Never true    Inability: Never true  . Transportation needs:    Medical: No    Non-medical: No  Tobacco Use  . Smoking status: Former Smoker    Years: 30.00    Types: Cigars    Last attempt to quit: 07/18/2006    Years since quitting: 12.1  . Smokeless tobacco: Never Used  Substance and Sexual Activity  . Alcohol use: No  . Drug use: No  . Sexual activity: Not Currently  Lifestyle  . Physical activity:    Days per week: 0 days    Minutes per session: 0 min  . Stress: Not at all  Relationships  . Social connections:    Talks on phone: More than three times a week    Gets together: More than three times a week    Attends religious service: More  than 4 times per year    Active member of club or organization: Yes    Attends meetings of clubs or organizations: More than 4 times per year    Relationship status: Married  Other Topics Concern  . Not on file  Social History Narrative  . Not on file    Allergies:  Allergies  Allergen Reactions  . Clonidine Derivatives Shortness Of Breath and Other (See Comments)    Low heart rate    Metabolic Disorder Labs: No results found for: HGBA1C, MPG No results found for: PROLACTIN Lab Results  Component Value Date   CHOL 118 03/28/2018   TRIG 186 (H) 03/28/2018   HDL 36 (L) 03/28/2018   CHOLHDL 3.3 03/28/2018   VLDL 37 03/28/2018   LDLCALC 45 03/28/2018   LDLCALC 41 10/11/2017   Lab Results  Component Value Date   TSH 1.030 02/05/2017   TSH 1.270 02/02/2016    Therapeutic Level Labs: No results found for: LITHIUM No results found for: VALPROATE No components found for:  CBMZ  Current Medications: Current Outpatient Medications  Medication Sig Dispense Refill  . amLODipine (NORVASC) 10 MG tablet Take 1 tablet (10 mg total) by mouth daily. 90 tablet 4  . aspirin EC 81 MG EC tablet Take 1 tablet (81 mg total) by mouth daily.    Marland Kitchen buPROPion (WELLBUTRIN) 100 MG tablet Take 1 tablet (100 mg total) by mouth daily. 90 tablet 1  . Cholecalciferol ([employer] VIT D 5000 HIGH-POTENCY) 5000 units capsule Take 5,000 Units by mouth daily.    . clopidogrel (PLAVIX) 75 MG tablet Take 75 mg by mouth daily.    Marland Kitchen dutasteride (AVODART) 0.5 MG capsule Take 1 capsule (0.5 mg total) by mouth every evening.    . escitalopram (LEXAPRO) 10 MG tablet Take 1.5 tablets (15 mg total) by mouth at bedtime. 135 tablet 1  . fluticasone (FLONASE) 50 MCG/ACT nasal spray USE 2 SPRAYS IN EACH NOSTRIL DAILY (Patient taking differently: Use 1 spray in each nostril daily as needed for allergies) 16 g 6  . hydrALAZINE (APRESOLINE) 25 MG tablet Take 25 mg by mouth daily.    . hydrALAZINE (APRESOLINE) 50 MG tablet     .  hydrochlorothiazide (HYDRODIURIL) 25 MG tablet Take 1 tablet (25 mg total) by mouth daily. 90 tablet 3  . hydrocortisone (ANUSOL-HC) 2.5 % rectal cream APPLY RECTALLY TWICE DAILY AS DIRECTED 30 g 0  . hydrOXYzine (VISTARIL) 25 MG capsule Take 1 capsule (25 mg total) by mouth 2 (two) times daily as needed for anxiety (only for severe anxiety sx and also for sleep). 60 capsule 1  . losartan (COZAAR) 100 MG tablet Take 1 tablet (100 mg total) by mouth every evening.    . meloxicam (MOBIC) 15 MG  tablet Take 1 tablet (15 mg total) by mouth at bedtime. 30 tablet 6  . meloxicam (MOBIC) 15 MG tablet TAKE ONE TABLET BY MOUTH EVERY DAY 30 tablet 5  . mupirocin ointment (BACTROBAN) 2 % APPLY AS DIRECTED (Patient taking differently: Apply topically daily as needed for irritation) 22 g 1  . niacin (NIASPAN) 1000 MG CR tablet Take 2 tablets (2,000 mg total) by mouth every evening.    . Omega-3 Fatty Acids (FISH OIL) 1000 MG CAPS Take 1,000 capsules by mouth daily.     . pantoprazole (PROTONIX) 20 MG tablet Take 20 mg by mouth daily.     . rosuvastatin (CRESTOR) 20 MG tablet Take 1 tablet (20 mg total) by mouth every evening. 30 tablet 0  . tamsulosin (FLOMAX) 0.4 MG CAPS capsule Take 1 capsule (0.4 mg total) by mouth daily. 90 capsule 4  . testosterone cypionate (DEPOTESTOSTERONE CYPIONATE) 200 MG/ML injection INJECT 0.75 ML INTRAMUSCULARLY EVERY 10 DAYS 10 mL 1  . vitamin B-12 (CYANOCOBALAMIN) 1000 MCG tablet Take 1,000 mcg by mouth daily.     No current facility-administered medications for this visit.      Musculoskeletal: Strength & Muscle Tone: within normal limits Gait & Station: normal Patient leans: N/A  Psychiatric Specialty Exam: Review of Systems  Psychiatric/Behavioral: Negative.  The patient is not nervous/anxious.   All other systems reviewed and are negative.   Blood pressure (!) 158/82, pulse 76, temperature 99 F (37.2 C), temperature source Oral, weight 238 lb 9.6 oz (108.2 kg).Body  mass index is 31.48 kg/m.  General Appearance: Casual  Eye Contact:  Fair  Speech:  Clear and Coherent  Volume:  Normal  Mood:  Euthymic  Affect:  Congruent  Thought Process:  Goal Directed and Descriptions of Associations: Intact  Orientation:  Full (Time, Place, and Person)  Thought Content: Logical   Suicidal Thoughts:  No  Homicidal Thoughts:  No  Memory:  Immediate;   Fair Recent;   Fair Remote;   Fair  Judgement:  Fair  Insight:  Fair  Psychomotor Activity:  Normal  Concentration:  Concentration: Fair and Attention Span: Fair  Recall:  [employer] of Knowledge: Fair  Language: Fair  Akathisia:  No  Handed:  Right  AIMS (if indicated): Denies tremors, rigidity, stiffness.  Assets:  Communication Skills Desire for Improvement Social Support  ADL's:  Intact  Cognition: WNL  Sleep:  Fair   Screenings: PHQ2-9     Clinical Support from 10/11/2017 in [employer] Visit from 03/12/2017 in [facility] Visit from 02/02/2016 in [facility]  PHQ-2 Total Score  6  1  1   PHQ-9 Total Score  24  -  -       Assessment and Plan: Lavere is a 67 year old Caucasian male who has a history of depression, anxiety, multiple medical problems including essential hypertension, coronary artery disease, status post stent placement, hyperlipidemia, BPH, knee pain, testosterone deficiency, presented to the clinic today for a follow-up visit.  Patient is making progress on the current regimen.  Patient continues to have relationship struggles and will continue psychotherapy sessions as needed.  Plan MDD Wellbutrin 100 mg p.o. daily Lexapro 15 mg p.o. daily-reduced dosage.  For GAD Lexapro at reduced dosage of 15 mg p.o. daily. Hydroxyzine 25 mg p.o. twice daily PRN for severe anxiety attacks  Follow-up in clinic in 2 to 3 months or sooner if needed.  More than 50 % of the time was spent for  psychoeducation and supportive psychotherapy and  care coordination.  This note was generated in part or whole with voice recognition software. Voice recognition is usually quite accurate but there are transcription errors that can and very often do occur. I apologize for any typographical errors that were not detected and corrected.        Ursula Alert, MD 09/01/2018, 12:23 PM

## 2018-09-08 ENCOUNTER — Inpatient Hospital Stay: Payer: Medicare Other

## 2018-09-08 ENCOUNTER — Inpatient Hospital Stay: Payer: Medicare Other | Attending: Oncology

## 2018-09-08 VITALS — BP 153/80 | HR 85 | Resp 18

## 2018-09-08 DIAGNOSIS — D751 Secondary polycythemia: Secondary | ICD-10-CM

## 2018-09-08 LAB — CBC WITH DIFFERENTIAL/PLATELET
Abs Immature Granulocytes: 0.01 10*3/uL (ref 0.00–0.07)
Basophils Absolute: 0.1 10*3/uL (ref 0.0–0.1)
Basophils Relative: 1 %
Eosinophils Absolute: 0.1 10*3/uL (ref 0.0–0.5)
Eosinophils Relative: 1 %
HCT: 51.9 % (ref 39.0–52.0)
Hemoglobin: 17.2 g/dL — ABNORMAL HIGH (ref 13.0–17.0)
Immature Granulocytes: 0 %
Lymphocytes Relative: 17 %
Lymphs Abs: 1.3 10*3/uL (ref 0.7–4.0)
MCH: 29.3 pg (ref 26.0–34.0)
MCHC: 33.1 g/dL (ref 30.0–36.0)
MCV: 88.3 fL (ref 80.0–100.0)
Monocytes Absolute: 0.8 10*3/uL (ref 0.1–1.0)
Monocytes Relative: 10 %
Neutro Abs: 5.6 10*3/uL (ref 1.7–7.7)
Neutrophils Relative %: 71 %
Platelets: 202 10*3/uL (ref 150–400)
RBC: 5.88 MIL/uL — ABNORMAL HIGH (ref 4.22–5.81)
RDW: 14.7 % (ref 11.5–15.5)
WBC: 7.9 10*3/uL (ref 4.0–10.5)
nRBC: 0 % (ref 0.0–0.2)

## 2018-09-24 ENCOUNTER — Other Ambulatory Visit: Payer: Self-pay

## 2018-09-24 ENCOUNTER — Encounter
Admission: RE | Admit: 2018-09-24 | Discharge: 2018-09-24 | Disposition: A | Discharge status: Home / Self Care | Payer: Medicare Other | Source: Ambulatory Visit | Attending: Orthopedic Surgery | Admitting: Orthopedic Surgery

## 2018-09-24 DIAGNOSIS — Z01818 Encounter for other preprocedural examination: Secondary | ICD-10-CM | POA: Diagnosis not present

## 2018-09-24 DIAGNOSIS — I10 Essential (primary) hypertension: Secondary | ICD-10-CM | POA: Diagnosis present

## 2018-09-24 HISTORY — DX: Unspecified osteoarthritis, unspecified site: M19.90

## 2018-09-24 LAB — BASIC METABOLIC PANEL
Anion gap: 9 (ref 5–15)
BUN: 20 mg/dL (ref 8–23)
CO2: 25 mmol/L (ref 22–32)
Calcium: 9.2 mg/dL (ref 8.9–10.3)
Chloride: 103 mmol/L (ref 98–111)
Creatinine, Ser: 1.13 mg/dL (ref 0.61–1.24)
GFR calc Af Amer: >60 mL/min (ref >60)
GFR calc non Af Amer: >60 mL/min (ref >60)
Glucose, Bld: 109 mg/dL — ABNORMAL HIGH (ref 70–99)
Potassium: 3.2 mmol/L — ABNORMAL LOW (ref 3.5–5.1)
Sodium: 137 mmol/L (ref 135–145)

## 2018-09-24 LAB — PROTIME-INR
INR: 0.97
Prothrombin Time: 12.8 seconds (ref 11.4–15.2)

## 2018-09-24 LAB — TYPE AND SCREEN
ABO/RH(D): A POS
Antibody Screen: NEGATIVE

## 2018-09-24 LAB — URINALYSIS, ROUTINE W REFLEX MICROSCOPIC
Bilirubin Urine: NEGATIVE
Glucose, UA: NEGATIVE mg/dL
Hgb urine dipstick: NEGATIVE
Ketones, ur: NEGATIVE mg/dL
Leukocytes, UA: NEGATIVE
Nitrite: NEGATIVE
PROTEIN: NEGATIVE mg/dL
Specific Gravity, Urine: 1.014 (ref 1.005–1.030)
pH: 6 (ref 5.0–8.0)

## 2018-09-24 LAB — CBC
HEMATOCRIT: 52.2 % — AB (ref 39.0–52.0)
Hemoglobin: 17.3 g/dL — ABNORMAL HIGH (ref 13.0–17.0)
MCH: 30.2 pg (ref 26.0–34.0)
MCHC: 33.1 g/dL (ref 30.0–36.0)
MCV: 91.1 fL (ref 80.0–100.0)
NRBC: 0 % (ref 0.0–0.2)
Platelets: 230 10*3/uL (ref 150–400)
RBC: 5.73 MIL/uL (ref 4.22–5.81)
RDW: 15.1 % (ref 11.5–15.5)
WBC: 5.6 10*3/uL (ref 4.0–10.5)

## 2018-09-24 LAB — APTT: aPTT: 28 seconds (ref 24–36)

## 2018-09-24 LAB — SURGICAL PCR SCREEN
MRSA, PCR: NEGATIVE
Staphylococcus aureus: POSITIVE — AB

## 2018-09-24 NOTE — Patient Instructions (Signed)
Your procedure is scheduled on: 10/08/18 Wed Report to Same Day Surgery 2nd floor medical mall [facility] Entrance-take elevator on left to 2nd floor.  Check in with surgery information desk.) To find out your arrival time please call [phone] between 1PM - 3PM on  1/19 Tues  Remember: Instructions that are not followed completely may result in serious medical risk, up to and including death, or upon the discretion of your surgeon and anesthesiologist your surgery may need to be rescheduled.    _x___ 1. Do not eat food after midnight the night before your procedure. You may drink clear liquids up to 2 hours before you are scheduled to arrive at the hospital for your procedure.  Do not drink clear liquids within 2 hours of your scheduled arrival to the hospital.  Clear liquids include  --Water or Apple juice without pulp  --Clear carbohydrate beverage such as ClearFast or Gatorade  --Black Coffee or Clear Tea (No milk, no creamers, do not add anything to                  the coffee or Tea Type 1 and type 2 diabetics should only drink water.   ____Ensure clear carbohydrate drink on the way to the hospital for bariatric patients  ____Ensure clear carbohydrate drink 3 hours before surgery for Dr Dwyane Luo patients if physician instructed.   No gum chewing or hard candies.     __x__ 2. No Alcohol for 24 hours before or after surgery.   __x__3. No Smoking or e-cigarettes for 24 prior to surgery.  Do not use any chewable tobacco products for at least 6 hour prior to surgery   ____  4. Bring all medications with you on the day of surgery if instructed.    __x__ 5. Notify your doctor if there is any change in your medical condition     (cold, fever, infections).    x___6. On the morning of surgery brush your teeth with toothpaste and water.  You may rinse your mouth with mouth wash if you wish.  Do not swallow any toothpaste or mouthwash.   Do not wear jewelry, make-up, hairpins,  clips or nail polish.  Do not wear lotions, powders, or perfumes. You may wear deodorant.  Do not shave 48 hours prior to surgery. Men may shave face and neck.  Do not bring valuables to the hospital.    [facility] is not responsible for any belongings or valuables.               Contacts, dentures or bridgework may not be worn into surgery.  Leave your suitcase in the car. After surgery it may be brought to your room.  For patients admitted to the hospital, discharge time is determined by your                       treatment team.  _  Patients discharged the day of surgery will not be allowed to drive home.  You will need someone to drive you home and stay with you the night of your procedure.    Please read over the following fact sheets that you were given:   [facility] Preparing for Surgery and or MRSA Information   _x___ Take anti-hypertensive listed below, cardiac, seizure, asthma,     anti-reflux and psychiatric medicines. These include:  1. amLODipine (NORVASC) 5 MG tablet  2.buPROPion (WELLBUTRIN) 100 MG tablet  3.hydrALAZINE (APRESOLINE) 100 MG tablet  4.pantoprazole (PROTONIX) 20 MG tablet  5.  6.  ____Fleets enema or Magnesium Citrate as directed.   _x___ Use CHG Soap or sage wipes as directed on instruction sheet   ____ Use inhalers on the day of surgery and bring to hospital day of surgery  ____ Stop Metformin and Janumet 2 days prior to surgery.    ____ Take 1/2 of usual insulin dose the night before surgery and none on the morning     surgery.   _x___ Follow recommendations from Cardiologist, Pulmonologist or PCP regarding          stopping Aspirin, Coumadin, Plavix ,Eliquis, Effient, or Pradaxa, and Pletal.  X____Stop Anti-inflammatories such as Advil, Aleve, Ibuprofen, Motrin, Naproxen, Naprosyn, Goodies powders or aspirin products. OK to take Tylenol and     Celebrex. Stop Meloxicam 1 week before surgery   _x___ Stop supplements until after surgery.  But may  continue Vitamin D, Vitamin B,       and multivitamin. Stop fish oil 1 week before surgery   ____ Bring C-Pap to the hospital.

## 2018-09-25 NOTE — Pre-Procedure Instructions (Signed)
Labs faxed to dr bower

## 2018-09-29 ENCOUNTER — Other Ambulatory Visit: Payer: Medicare Other

## 2018-09-30 ENCOUNTER — Ambulatory Visit: Payer: Medicare Other | Admitting: Family Medicine

## 2018-09-30 ENCOUNTER — Encounter: Payer: Self-pay | Admitting: Family Medicine

## 2018-09-30 DIAGNOSIS — M1710 Unilateral primary osteoarthritis, unspecified knee: Secondary | ICD-10-CM | POA: Diagnosis not present

## 2018-09-30 DIAGNOSIS — D751 Secondary polycythemia: Secondary | ICD-10-CM

## 2018-09-30 DIAGNOSIS — E876 Hypokalemia: Secondary | ICD-10-CM

## 2018-09-30 DIAGNOSIS — F331 Major depressive disorder, recurrent, moderate: Secondary | ICD-10-CM

## 2018-09-30 DIAGNOSIS — I10 Essential (primary) hypertension: Secondary | ICD-10-CM | POA: Diagnosis not present

## 2018-09-30 DIAGNOSIS — I2510 Atherosclerotic heart disease of native coronary artery without angina pectoris: Secondary | ICD-10-CM

## 2018-09-30 DIAGNOSIS — E291 Testicular hypofunction: Secondary | ICD-10-CM

## 2018-09-30 MED ORDER — POTASSIUM CHLORIDE CRYS ER 20 MEQ PO TBCR: 20.0000 meq | EXTENDED_RELEASE_TABLET | Freq: Every day | ORAL | 3 refills | Status: DC

## 2018-09-30 MED ORDER — FLUTICASONE PROPIONATE 50 MCG/ACT NA SUSP: 2.0000 | Freq: Every day | NASAL | 12 refills | Status: DC

## 2018-09-30 NOTE — Assessment & Plan Note (Signed)
Pending knee replacement surgery

## 2018-09-30 NOTE — Assessment & Plan Note (Signed)
Followed by psychiatry and stable 

## 2018-09-30 NOTE — Progress Notes (Signed)
BP (!) 144/60 (BP Location: Left Arm)   Pulse 81   Temp 97.8 F (36.6 C) (Oral)   Wt 236 lb (107 kg)   SpO2 96%   BMI 31.14 kg/m    Subjective:    Patient ID: Kenneth Pruitt, male    DOB: 03/22/1951, 68 y.o.   MRN: [id number]  HPI: Kenneth Pruitt is a 68 y.o. male  Chief Complaint  Patient presents with  . Surgery Clearance    pt had EKG 09/24/18  Patient preop clearance for right knee replacement.  Patient's had arthritis complaints for over 10 years getting ready for surgery. Patient surgery has been delayed due to placement of stent 6 months ago now is cleared for surgery by cardiology.  Patient has been told to keep taking his aspirin 81 mg stop his Plavix and meloxicam. Patient had a phlebotomy for elevated hemoglobin December 23.  Patient's hemoglobin becomes elevated because of testosterone therapy.  Patient sees urology for his testosterone therapy. Takes blood pressure medications faithfully without problems has definite whitecoat hypertension home blood pressure readings are generally good.  Patient takes his medications on a regular basis.  On chart review from blood work done last week hemoglobin is elevated.  At 17.2.  Patient also with BMP showing potassium of 3.2.  Patient's had no change in his dietary or potassium consumption.  Relevant past medical, surgical, family and social history reviewed and updated as indicated. Interim medical history since our last visit reviewed. Allergies and medications reviewed and updated.  Review of Systems  Constitutional: Negative.   HENT: Negative.   Eyes: Negative.   Respiratory: Negative.   Cardiovascular: Negative.   Gastrointestinal: Negative.   Endocrine: Negative.   Genitourinary: Negative.   Musculoskeletal: Negative.   Skin: Negative.   Allergic/Immunologic: Negative.   Neurological: Negative.   Hematological: Negative.   Psychiatric/Behavioral: Negative.     Per HPI unless specifically indicated above     Objective:    BP (!) 144/60 (BP Location: Left Arm)   Pulse 81   Temp 97.8 F (36.6 C) (Oral)   Wt 236 lb (107 kg)   SpO2 96%   BMI 31.14 kg/m   Wt Readings from Last 3 Encounters:  09/30/18 236 lb (107 kg)  09/24/18 230 lb (104.3 kg)  06/09/18 251 lb (113.9 kg)    Physical Exam Constitutional:      Appearance: He is well-developed.  HENT:     Head: Normocephalic.     Right Ear: External ear normal.     Left Ear: External ear normal.     Nose: Nose normal.  Eyes:     Conjunctiva/sclera: Conjunctivae normal.     Pupils: Pupils are equal, round, and reactive to light.  Neck:     Musculoskeletal: Normal range of motion and neck supple.     Thyroid: No thyromegaly.  Cardiovascular:     Rate and Rhythm: Normal rate and regular rhythm.     Heart sounds: Normal heart sounds.  Pulmonary:     Effort: Pulmonary effort is normal.     Breath sounds: Normal breath sounds.  Abdominal:     General: Bowel sounds are normal.     Palpations: Abdomen is soft. There is no hepatomegaly or splenomegaly.  Genitourinary:    Penis: Normal.   Musculoskeletal: Normal range of motion.  Lymphadenopathy:     Cervical: No cervical adenopathy.  Skin:    General: Skin is warm and dry.  Neurological:  Mental Status: He is alert and oriented to person, place, and time.     Deep Tendon Reflexes: Reflexes are normal and symmetric.  Psychiatric:        Behavior: Behavior normal.        Thought Content: Thought content normal.        Judgment: Judgment normal.     Results for orders placed or performed during the hospital encounter of 09/24/18  Surgical pcr screen  Result Value Ref Range   MRSA, PCR NEGATIVE NEGATIVE   Staphylococcus aureus POSITIVE (A) NEGATIVE  APTT  Result Value Ref Range   aPTT 28 24 - 36 seconds  Basic metabolic panel  Result Value Ref Range   Sodium 137 135 - 145 mmol/L   Potassium 3.2 (L) 3.5 - 5.1 mmol/L   Chloride 103 98 - 111 mmol/L   CO2 25 22 - 32  mmol/L   Glucose, Bld 109 (H) 70 - 99 mg/dL   BUN 20 8 - 23 mg/dL   Creatinine, Ser 1.13 0.61 - 1.24 mg/dL   Calcium 9.2 8.9 - 10.3 mg/dL   GFR calc non Af Amer >60 >60 mL/min   GFR calc Af Amer >60 >60 mL/min   Anion gap 9 5 - 15  CBC  Result Value Ref Range   WBC 5.6 4.0 - 10.5 K/uL   RBC 5.73 4.22 - 5.81 MIL/uL   Hemoglobin 17.3 (H) 13.0 - 17.0 g/dL   HCT 52.2 (H) 39.0 - 52.0 %   MCV 91.1 80.0 - 100.0 fL   MCH 30.2 26.0 - 34.0 pg   MCHC 33.1 30.0 - 36.0 g/dL   RDW 15.1 11.5 - 15.5 %   Platelets 230 150 - 400 K/uL   nRBC 0.0 0.0 - 0.2 %  Protime-INR  Result Value Ref Range   Prothrombin Time 12.8 11.4 - 15.2 seconds   INR 0.97   Urinalysis, Routine w reflex microscopic  Result Value Ref Range   Color, Urine YELLOW (A) YELLOW   APPearance CLEAR (A) CLEAR   Specific Gravity, Urine 1.014 1.005 - 1.030   pH 6.0 5.0 - 8.0   Glucose, UA NEGATIVE NEGATIVE mg/dL   Hgb urine dipstick NEGATIVE NEGATIVE   Bilirubin Urine NEGATIVE NEGATIVE   Ketones, ur NEGATIVE NEGATIVE mg/dL   Protein, ur NEGATIVE NEGATIVE mg/dL   Nitrite NEGATIVE NEGATIVE   Leukocytes, UA NEGATIVE NEGATIVE  Type and screen [facility]  Result Value Ref Range   ABO/RH(D) A POS    Antibody Screen NEG    Sample Expiration 10/08/2018    Extend sample reason      NO TRANSFUSIONS OR PREGNANCY IN THE PAST 3 MONTHS Performed at [facility], [address] [place],  [address]       Assessment & Plan:   Problem List Items Addressed This Visit      Cardiovascular and Mediastinum   Essential hypertension    The current medical regimen is effective;  continue present plan and medications.       CAD (coronary artery disease)    Stable 6 months ago had stent placement now ready for surgery no further cardiovascular symptoms.  Patient will continue aspirin 81 mg during surgery as recommended by cardiologist.  Will be stopping Plavix        Endocrine   Hypogonadism in  male    Stable followed by urology with testosterone replacement therapy and causing secondary polycythemia        Musculoskeletal  and Integument   Arthritis of knee    Pending knee replacement surgery        Other   Depression    Followed by psychiatry and stable      Polycythemia, secondary    Discussed elevated hemoglobin of 17.2 with patient's hematologist Dr. Grayland Ormond.  Dr. Grayland Ormond felt no intervention needed to be done with no phlebotomy and no adjustments made to surgery. Patient will follow-up at routine office visit in 3 months.      Hypokalemia    Discussed hypokalemia will start potassium replacement therapy and recheck in 4 days.      Relevant Orders   Basic metabolic panel       Follow up plan: Return in about 4 days (around 10/04/2018) for Checking BMP in 4 days.

## 2018-09-30 NOTE — Assessment & Plan Note (Signed)
Discussed elevated hemoglobin of 17.2 with patient's hematologist Dr. Grayland Ormond.  Dr. Grayland Ormond felt no intervention needed to be done with no phlebotomy and no adjustments made to surgery. Patient will follow-up at routine office visit in 3 months.

## 2018-09-30 NOTE — Assessment & Plan Note (Signed)
Stable followed by urology with testosterone replacement therapy and causing secondary polycythemia

## 2018-09-30 NOTE — Assessment & Plan Note (Signed)
Discussed hypokalemia will start potassium replacement therapy and recheck in 4 days.

## 2018-09-30 NOTE — Assessment & Plan Note (Signed)
The current medical regimen is effective;  continue present plan and medications.  

## 2018-09-30 NOTE — Assessment & Plan Note (Signed)
Stable 6 months ago had stent placement now ready for surgery no further cardiovascular symptoms.  Patient will continue aspirin 81 mg during surgery as recommended by cardiologist.  Will be stopping Plavix

## 2018-10-01 ENCOUNTER — Other Ambulatory Visit: Payer: Medicare Other

## 2018-10-01 NOTE — Pre-Procedure Instructions (Signed)
Dr s Humphrey Rolls clearance from 10/19 on chart. Clearance from dr m Jeananne Rama on chart. His note addresses consult with dr Grayland Ormond re polycythemia and no need for further workup preop

## 2018-10-06 ENCOUNTER — Other Ambulatory Visit: Payer: Medicare Other

## 2018-10-06 DIAGNOSIS — E876 Hypokalemia: Secondary | ICD-10-CM

## 2018-10-07 ENCOUNTER — Encounter: Payer: Self-pay | Admitting: Family Medicine

## 2018-10-07 LAB — BASIC METABOLIC PANEL
BUN/Creatinine Ratio: 15 (ref 10–24)
BUN: 18 mg/dL (ref 8–27)
CO2: 23 mmol/L (ref 20–29)
CREATININE: 1.23 mg/dL (ref 0.76–1.27)
Calcium: 9.5 mg/dL (ref 8.6–10.2)
Chloride: 101 mmol/L (ref 96–106)
GFR calc Af Amer: 70 mL/min/{1.73_m2} (ref >59)
GFR calc non Af Amer: 60 mL/min/{1.73_m2} (ref >59)
Glucose: 149 mg/dL — ABNORMAL HIGH (ref 65–99)
POTASSIUM: 4.1 mmol/L (ref 3.5–5.2)
SODIUM: 140 mmol/L (ref 134–144)

## 2018-10-07 MED ORDER — TRANEXAMIC ACID-NACL 1000-0.7 MG/100ML-% IV SOLN
1000.0000 mg | INTRAVENOUS | Status: AC
  Administered 2018-10-08: 1000 mg via INTRAVENOUS
  Filled 2018-10-07: qty 100

## 2018-10-07 MED ORDER — CEFAZOLIN SODIUM-DEXTROSE 2-4 GM/100ML-% IV SOLN
2.0000 g | INTRAVENOUS | Status: AC
  Administered 2018-10-08: 2 g via INTRAVENOUS

## 2018-10-08 ENCOUNTER — Inpatient Hospital Stay: Payer: Medicare Other | Admitting: Anesthesiology

## 2018-10-08 ENCOUNTER — Inpatient Hospital Stay
Admission: RE | Admit: 2018-10-08 | Discharge: 2018-10-10 | DRG: 470 | Disposition: A | Discharge status: Home / Self Care | Payer: Medicare Other | Attending: Orthopedic Surgery | Admitting: Orthopedic Surgery

## 2018-10-08 ENCOUNTER — Other Ambulatory Visit: Payer: Self-pay

## 2018-10-08 ENCOUNTER — Inpatient Hospital Stay: Payer: Medicare Other

## 2018-10-08 ENCOUNTER — Encounter
Admission: RE | Disposition: A | Discharge status: Home / Self Care | Payer: Self-pay | Source: Home / Self Care | Attending: Orthopedic Surgery

## 2018-10-08 DIAGNOSIS — Z79899 Other long term (current) drug therapy: Secondary | ICD-10-CM | POA: Diagnosis not present

## 2018-10-08 DIAGNOSIS — E669 Obesity, unspecified: Secondary | ICD-10-CM | POA: Diagnosis present

## 2018-10-08 DIAGNOSIS — F329 Major depressive disorder, single episode, unspecified: Secondary | ICD-10-CM | POA: Diagnosis present

## 2018-10-08 DIAGNOSIS — I252 Old myocardial infarction: Secondary | ICD-10-CM | POA: Diagnosis not present

## 2018-10-08 DIAGNOSIS — E785 Hyperlipidemia, unspecified: Secondary | ICD-10-CM | POA: Diagnosis present

## 2018-10-08 DIAGNOSIS — Z95818 Presence of other cardiac implants and grafts: Secondary | ICD-10-CM

## 2018-10-08 DIAGNOSIS — I10 Essential (primary) hypertension: Secondary | ICD-10-CM | POA: Diagnosis present

## 2018-10-08 DIAGNOSIS — Z96651 Presence of right artificial knee joint: Secondary | ICD-10-CM

## 2018-10-08 DIAGNOSIS — Z87891 Personal history of nicotine dependence: Secondary | ICD-10-CM

## 2018-10-08 DIAGNOSIS — I2510 Atherosclerotic heart disease of native coronary artery without angina pectoris: Secondary | ICD-10-CM | POA: Diagnosis present

## 2018-10-08 DIAGNOSIS — M1711 Unilateral primary osteoarthritis, right knee: Secondary | ICD-10-CM | POA: Diagnosis present

## 2018-10-08 DIAGNOSIS — Z09 Encounter for follow-up examination after completed treatment for conditions other than malignant neoplasm: Secondary | ICD-10-CM

## 2018-10-08 DIAGNOSIS — Z6831 Body mass index (BMI) 31.0-31.9, adult: Secondary | ICD-10-CM

## 2018-10-08 HISTORY — PX: TOTAL KNEE ARTHROPLASTY: SHX125

## 2018-10-08 LAB — POCT I-STAT 4, (NA,K, GLUC, HGB,HCT)
Glucose, Bld: 114 mg/dL — ABNORMAL HIGH (ref 70–99)
HCT: 52 % (ref 39.0–52.0)
HEMOGLOBIN: 17.7 g/dL — AB (ref 13.0–17.0)
Potassium: 3.8 mmol/L (ref 3.5–5.1)
SODIUM: 139 mmol/L (ref 135–145)

## 2018-10-08 LAB — ABO/RH: ABO/RH(D): A POS

## 2018-10-08 SURGERY — ARTHROPLASTY, KNEE, TOTAL
Anesthesia: General | Site: Knee | Laterality: Right

## 2018-10-08 MED ORDER — DEXAMETHASONE SODIUM PHOSPHATE 10 MG/ML IJ SOLN
INTRAMUSCULAR | Status: DC | PRN
  Administered 2018-10-08: 10 mg via INTRAVENOUS

## 2018-10-08 MED ORDER — SODIUM CHLORIDE FLUSH 0.9 % IV SOLN
INTRAVENOUS | Status: AC
  Filled 2018-10-08: qty 20

## 2018-10-08 MED ORDER — BUPIVACAINE LIPOSOME 1.3 % IJ SUSP
INTRAMUSCULAR | Status: AC
  Filled 2018-10-08: qty 20

## 2018-10-08 MED ORDER — ROCURONIUM BROMIDE 100 MG/10ML IV SOLN
INTRAVENOUS | Status: DC | PRN
  Administered 2018-10-08: 50 mg via INTRAVENOUS

## 2018-10-08 MED ORDER — HYDROCHLOROTHIAZIDE 25 MG PO TABS
25.0000 mg | ORAL_TABLET | Freq: Every day | ORAL | Status: DC
  Administered 2018-10-09 – 2018-10-10 (×2): 25 mg via ORAL
  Filled 2018-10-08 (×2): qty 1

## 2018-10-08 MED ORDER — MORPHINE SULFATE (PF) 2 MG/ML IV SOLN: 0.5000 mg | INTRAVENOUS | Status: DC | PRN

## 2018-10-08 MED ORDER — GABAPENTIN 300 MG PO CAPS
300.0000 mg | ORAL_CAPSULE | Freq: Once | ORAL | Status: AC
  Administered 2018-10-08: 300 mg via ORAL

## 2018-10-08 MED ORDER — ROPIVACAINE HCL 5 MG/ML IJ SOLN
INTRAMUSCULAR | Status: AC
  Filled 2018-10-08: qty 30

## 2018-10-08 MED ORDER — CHLORHEXIDINE GLUCONATE 4 % EX LIQD: 60.0000 mL | Freq: Once | CUTANEOUS | Status: DC

## 2018-10-08 MED ORDER — FLUTICASONE PROPIONATE 50 MCG/ACT NA SUSP
1.0000 | Freq: Every day | NASAL | Status: DC | PRN
  Filled 2018-10-08: qty 16

## 2018-10-08 MED ORDER — HYDRALAZINE HCL 50 MG PO TABS
100.0000 mg | ORAL_TABLET | Freq: Every day | ORAL | Status: DC
  Administered 2018-10-08 – 2018-10-09 (×2): 100 mg via ORAL
  Filled 2018-10-08 (×2): qty 2

## 2018-10-08 MED ORDER — HYDROXYZINE HCL 25 MG PO TABS
25.0000 mg | ORAL_TABLET | Freq: Two times a day (BID) | ORAL | Status: DC | PRN
  Filled 2018-10-08: qty 1

## 2018-10-08 MED ORDER — HYDRALAZINE HCL 50 MG PO TABS
50.0000 mg | ORAL_TABLET | Freq: Every morning | ORAL | Status: DC
  Administered 2018-10-09 – 2018-10-10 (×2): 50 mg via ORAL
  Filled 2018-10-08 (×2): qty 1

## 2018-10-08 MED ORDER — FENTANYL CITRATE (PF) 100 MCG/2ML IJ SOLN
INTRAMUSCULAR | Status: DC | PRN
  Administered 2018-10-08 (×2): 50 ug via INTRAVENOUS
  Administered 2018-10-08: 100 ug via INTRAVENOUS

## 2018-10-08 MED ORDER — OXYCODONE HCL 5 MG PO TABS: 5.0000 mg | ORAL_TABLET | Freq: Once | ORAL | Status: DC | PRN

## 2018-10-08 MED ORDER — LACTATED RINGERS IV SOLN
INTRAVENOUS | Status: DC
  Administered 2018-10-08 – 2018-10-09 (×2): via INTRAVENOUS

## 2018-10-08 MED ORDER — ACETAMINOPHEN 500 MG PO TABS
ORAL_TABLET | ORAL | Status: AC
  Filled 2018-10-08: qty 2

## 2018-10-08 MED ORDER — LIDOCAINE HCL (PF) 1 % IJ SOLN
INTRAMUSCULAR | Status: DC | PRN
  Administered 2018-10-08: 3 mL

## 2018-10-08 MED ORDER — CLOPIDOGREL BISULFATE 75 MG PO TABS
75.0000 mg | ORAL_TABLET | Freq: Every day | ORAL | Status: DC
  Administered 2018-10-09 – 2018-10-10 (×2): 75 mg via ORAL
  Filled 2018-10-08 (×2): qty 1

## 2018-10-08 MED ORDER — PANTOPRAZOLE SODIUM 20 MG PO TBEC
20.0000 mg | DELAYED_RELEASE_TABLET | Freq: Every day | ORAL | Status: DC
  Administered 2018-10-09 – 2018-10-10 (×2): 20 mg via ORAL
  Filled 2018-10-08 (×2): qty 1

## 2018-10-08 MED ORDER — BUPIVACAINE HCL (PF) 0.5 % IJ SOLN
INTRAMUSCULAR | Status: AC
  Filled 2018-10-08: qty 10

## 2018-10-08 MED ORDER — ASPIRIN EC 81 MG PO TBEC
81.0000 mg | DELAYED_RELEASE_TABLET | Freq: Every day | ORAL | Status: DC
  Administered 2018-10-09 – 2018-10-10 (×2): 81 mg via ORAL
  Filled 2018-10-08 (×2): qty 1

## 2018-10-08 MED ORDER — LIDOCAINE HCL 4 % MT SOLN
OROMUCOSAL | Status: DC | PRN
  Administered 2018-10-08: 4 mL via TOPICAL

## 2018-10-08 MED ORDER — PHENYLEPHRINE HCL 10 MG/ML IJ SOLN
INTRAMUSCULAR | Status: DC | PRN
  Administered 2018-10-08 (×3): 100 ug via INTRAVENOUS

## 2018-10-08 MED ORDER — BACITRACIN 50000 UNITS IM SOLR
INTRAMUSCULAR | Status: AC
  Filled 2018-10-08: qty 2

## 2018-10-08 MED ORDER — KETAMINE HCL 50 MG/ML IJ SOLN
INTRAMUSCULAR | Status: DC | PRN
  Administered 2018-10-08: 50 mg via INTRAMUSCULAR

## 2018-10-08 MED ORDER — CEFAZOLIN SODIUM-DEXTROSE 2-4 GM/100ML-% IV SOLN
INTRAVENOUS | Status: AC
  Filled 2018-10-08: qty 100

## 2018-10-08 MED ORDER — LACTATED RINGERS IV SOLN
INTRAVENOUS | Status: DC
  Administered 2018-10-08: 08:00:00 via INTRAVENOUS

## 2018-10-08 MED ORDER — DOCUSATE SODIUM 100 MG PO CAPS
100.0000 mg | ORAL_CAPSULE | Freq: Two times a day (BID) | ORAL | Status: DC
  Administered 2018-10-09 – 2018-10-10 (×3): 100 mg via ORAL
  Filled 2018-10-08 (×3): qty 1

## 2018-10-08 MED ORDER — NIACIN ER (ANTIHYPERLIPIDEMIC) 500 MG PO TBCR
2000.0000 mg | EXTENDED_RELEASE_TABLET | Freq: Every evening | ORAL | Status: DC
  Administered 2018-10-09: 2000 mg via ORAL
  Filled 2018-10-08 (×3): qty 4

## 2018-10-08 MED ORDER — BUPROPION HCL 100 MG PO TABS
100.0000 mg | ORAL_TABLET | Freq: Every day | ORAL | Status: DC
  Administered 2018-10-09 – 2018-10-10 (×2): 100 mg via ORAL
  Filled 2018-10-08 (×2): qty 1

## 2018-10-08 MED ORDER — GABAPENTIN 300 MG PO CAPS
300.0000 mg | ORAL_CAPSULE | Freq: Three times a day (TID) | ORAL | Status: DC
  Administered 2018-10-08 – 2018-10-10 (×5): 300 mg via ORAL
  Filled 2018-10-08 (×5): qty 1

## 2018-10-08 MED ORDER — MAGNESIUM CITRATE PO SOLN
1.0000 | Freq: Once | ORAL | Status: DC | PRN
  Filled 2018-10-08: qty 296

## 2018-10-08 MED ORDER — ACETAMINOPHEN 325 MG PO TABS: 325.0000 mg | ORAL_TABLET | Freq: Four times a day (QID) | ORAL | Status: DC | PRN

## 2018-10-08 MED ORDER — MIDAZOLAM HCL 2 MG/2ML IJ SOLN
INTRAMUSCULAR | Status: AC
  Filled 2018-10-08: qty 2

## 2018-10-08 MED ORDER — FENTANYL CITRATE (PF) 250 MCG/5ML IJ SOLN
INTRAMUSCULAR | Status: AC
  Filled 2018-10-08: qty 5

## 2018-10-08 MED ORDER — PROPOFOL 500 MG/50ML IV EMUL
INTRAVENOUS | Status: AC
  Filled 2018-10-08: qty 50

## 2018-10-08 MED ORDER — MENTHOL 3 MG MT LOZG
1.0000 | LOZENGE | OROMUCOSAL | Status: DC | PRN
  Filled 2018-10-08: qty 9

## 2018-10-08 MED ORDER — ONDANSETRON HCL 4 MG/2ML IJ SOLN
4.0000 mg | Freq: Four times a day (QID) | INTRAMUSCULAR | Status: DC | PRN
  Administered 2018-10-08: 4 mg via INTRAVENOUS
  Filled 2018-10-08: qty 2

## 2018-10-08 MED ORDER — ACETAMINOPHEN 500 MG PO TABS
500.0000 mg | ORAL_TABLET | Freq: Four times a day (QID) | ORAL | Status: AC
  Administered 2018-10-08 – 2018-10-09 (×4): 500 mg via ORAL
  Filled 2018-10-08 (×4): qty 1

## 2018-10-08 MED ORDER — KETOROLAC TROMETHAMINE 15 MG/ML IJ SOLN
7.5000 mg | Freq: Four times a day (QID) | INTRAMUSCULAR | Status: AC
  Administered 2018-10-08 – 2018-10-09 (×4): 7.5 mg via INTRAVENOUS
  Filled 2018-10-08 (×4): qty 1

## 2018-10-08 MED ORDER — VITAMIN B-12 1000 MCG PO TABS
1000.0000 ug | ORAL_TABLET | Freq: Every day | ORAL | Status: DC
  Administered 2018-10-09 – 2018-10-10 (×2): 1000 ug via ORAL
  Filled 2018-10-08 (×2): qty 1

## 2018-10-08 MED ORDER — HYDROCODONE-ACETAMINOPHEN 5-325 MG PO TABS
1.0000 | ORAL_TABLET | ORAL | Status: DC | PRN
  Administered 2018-10-08 – 2018-10-09 (×2): 1 via ORAL
  Administered 2018-10-09: 2 via ORAL
  Administered 2018-10-09: 1 via ORAL
  Administered 2018-10-10 (×3): 2 via ORAL
  Filled 2018-10-08 (×2): qty 1
  Filled 2018-10-08 (×2): qty 2
  Filled 2018-10-08: qty 1
  Filled 2018-10-08 (×3): qty 2

## 2018-10-08 MED ORDER — VASOPRESSIN 20 UNIT/ML IV SOLN
INTRAVENOUS | Status: DC | PRN
  Administered 2018-10-08 (×2): 1 [IU] via INTRAVENOUS

## 2018-10-08 MED ORDER — ROPIVACAINE HCL 5 MG/ML IJ SOLN
INTRAMUSCULAR | Status: DC | PRN
  Administered 2018-10-08: 30 mL via PERINEURAL

## 2018-10-08 MED ORDER — SODIUM CHLORIDE 0.9 % IV SOLN
INTRAVENOUS | Status: DC | PRN
  Administered 2018-10-08: 60 mL

## 2018-10-08 MED ORDER — PROMETHAZINE HCL 25 MG/ML IJ SOLN: 6.2500 mg | INTRAMUSCULAR | Status: DC | PRN

## 2018-10-08 MED ORDER — SODIUM CHLORIDE (PF) 0.9 % IJ SOLN
INTRAMUSCULAR | Status: AC
  Filled 2018-10-08: qty 50

## 2018-10-08 MED ORDER — DEXAMETHASONE SODIUM PHOSPHATE 10 MG/ML IJ SOLN
INTRAMUSCULAR | Status: AC
  Filled 2018-10-08: qty 1

## 2018-10-08 MED ORDER — ONDANSETRON HCL 4 MG PO TABS: 4.0000 mg | ORAL_TABLET | Freq: Four times a day (QID) | ORAL | Status: DC | PRN

## 2018-10-08 MED ORDER — BUPIVACAINE-EPINEPHRINE (PF) 0.5% -1:200000 IJ SOLN
INTRAMUSCULAR | Status: DC | PRN
  Administered 2018-10-08: 30 mL

## 2018-10-08 MED ORDER — MIDAZOLAM HCL 2 MG/2ML IJ SOLN
INTRAMUSCULAR | Status: DC | PRN
  Administered 2018-10-08: 2 mg via INTRAVENOUS

## 2018-10-08 MED ORDER — LIDOCAINE HCL (PF) 1 % IJ SOLN
INTRAMUSCULAR | Status: AC
  Filled 2018-10-08: qty 5

## 2018-10-08 MED ORDER — LIDOCAINE HCL (PF) 2 % IJ SOLN
INTRAMUSCULAR | Status: AC
  Filled 2018-10-08: qty 10

## 2018-10-08 MED ORDER — SODIUM CHLORIDE 0.9 % IV SOLN
INTRAVENOUS | Status: DC | PRN
  Administered 2018-10-08: 50 ug/min via INTRAVENOUS

## 2018-10-08 MED ORDER — BISACODYL 10 MG RE SUPP: 10.0000 mg | Freq: Every day | RECTAL | Status: DC | PRN

## 2018-10-08 MED ORDER — FENTANYL CITRATE (PF) 100 MCG/2ML IJ SOLN: 25.0000 ug | INTRAMUSCULAR | Status: DC | PRN

## 2018-10-08 MED ORDER — VASOPRESSIN 20 UNIT/ML IV SOLN
INTRAVENOUS | Status: AC
  Filled 2018-10-08: qty 1

## 2018-10-08 MED ORDER — OXYCODONE HCL 5 MG/5ML PO SOLN: 5.0000 mg | Freq: Once | ORAL | Status: DC | PRN

## 2018-10-08 MED ORDER — BUPIVACAINE-EPINEPHRINE (PF) 0.5% -1:200000 IJ SOLN
INTRAMUSCULAR | Status: AC
  Filled 2018-10-08: qty 30

## 2018-10-08 MED ORDER — EPHEDRINE SULFATE 50 MG/ML IJ SOLN
INTRAMUSCULAR | Status: DC | PRN
  Administered 2018-10-08 (×2): 10 mg via INTRAVENOUS
  Administered 2018-10-08: 5 mg via INTRAVENOUS
  Administered 2018-10-08 (×2): 10 mg via INTRAVENOUS

## 2018-10-08 MED ORDER — CEFAZOLIN SODIUM-DEXTROSE 2-4 GM/100ML-% IV SOLN
2.0000 g | Freq: Four times a day (QID) | INTRAVENOUS | Status: AC
  Administered 2018-10-08 (×2): 2 g via INTRAVENOUS
  Filled 2018-10-08 (×2): qty 100

## 2018-10-08 MED ORDER — SUGAMMADEX SODIUM 200 MG/2ML IV SOLN
INTRAVENOUS | Status: DC | PRN
  Administered 2018-10-08: 200 mg via INTRAVENOUS

## 2018-10-08 MED ORDER — METOCLOPRAMIDE HCL 5 MG/ML IJ SOLN: 5.0000 mg | Freq: Three times a day (TID) | INTRAMUSCULAR | Status: DC | PRN

## 2018-10-08 MED ORDER — ESCITALOPRAM OXALATE 10 MG PO TABS
15.0000 mg | ORAL_TABLET | Freq: Every day | ORAL | Status: DC
  Administered 2018-10-08 – 2018-10-09 (×2): 15 mg via ORAL
  Filled 2018-10-08 (×3): qty 1.5

## 2018-10-08 MED ORDER — GABAPENTIN 300 MG PO CAPS
ORAL_CAPSULE | ORAL | Status: AC
  Filled 2018-10-08: qty 1

## 2018-10-08 MED ORDER — ONDANSETRON HCL 4 MG/2ML IJ SOLN
INTRAMUSCULAR | Status: DC | PRN
  Administered 2018-10-08: 4 mg via INTRAVENOUS

## 2018-10-08 MED ORDER — KETAMINE HCL 50 MG/ML IJ SOLN
INTRAMUSCULAR | Status: AC
  Filled 2018-10-08: qty 10

## 2018-10-08 MED ORDER — ROSUVASTATIN CALCIUM 10 MG PO TABS
20.0000 mg | ORAL_TABLET | Freq: Every evening | ORAL | Status: DC
  Administered 2018-10-09: 20 mg via ORAL
  Filled 2018-10-08: qty 2

## 2018-10-08 MED ORDER — LACTATED RINGERS IV SOLN: INTRAVENOUS | Status: DC

## 2018-10-08 MED ORDER — METOCLOPRAMIDE HCL 10 MG PO TABS: 5.0000 mg | ORAL_TABLET | Freq: Three times a day (TID) | ORAL | Status: DC | PRN

## 2018-10-08 MED ORDER — PROPOFOL 10 MG/ML IV BOLUS
INTRAVENOUS | Status: DC | PRN
  Administered 2018-10-08: 150 mg via INTRAVENOUS

## 2018-10-08 MED ORDER — PHENOL 1.4 % MT LIQD
1.0000 | OROMUCOSAL | Status: DC | PRN
  Filled 2018-10-08: qty 177

## 2018-10-08 MED ORDER — LIDOCAINE HCL (CARDIAC) PF 100 MG/5ML IV SOSY
PREFILLED_SYRINGE | INTRAVENOUS | Status: DC | PRN
  Administered 2018-10-08: 100 mg via INTRAVENOUS

## 2018-10-08 MED ORDER — ONDANSETRON HCL 4 MG/2ML IJ SOLN
INTRAMUSCULAR | Status: AC
  Filled 2018-10-08: qty 2

## 2018-10-08 MED ORDER — HYDRALAZINE HCL 100 MG PO TABS: 50.0000 mg | ORAL_TABLET | ORAL | Status: DC

## 2018-10-08 MED ORDER — TAMSULOSIN HCL 0.4 MG PO CAPS
0.4000 mg | ORAL_CAPSULE | Freq: Every day | ORAL | Status: DC
  Administered 2018-10-09 – 2018-10-10 (×2): 0.4 mg via ORAL
  Filled 2018-10-08 (×2): qty 1

## 2018-10-08 MED ORDER — DUTASTERIDE 0.5 MG PO CAPS
0.5000 mg | ORAL_CAPSULE | Freq: Every evening | ORAL | Status: DC
  Administered 2018-10-09: 0.5 mg via ORAL
  Filled 2018-10-08 (×3): qty 1

## 2018-10-08 MED ORDER — MEPERIDINE HCL 50 MG/ML IJ SOLN: 6.2500 mg | INTRAMUSCULAR | Status: DC | PRN

## 2018-10-08 MED ORDER — POTASSIUM CHLORIDE CRYS ER 20 MEQ PO TBCR
20.0000 meq | EXTENDED_RELEASE_TABLET | Freq: Every day | ORAL | Status: DC
  Administered 2018-10-09 – 2018-10-10 (×2): 20 meq via ORAL
  Filled 2018-10-08 (×2): qty 1

## 2018-10-08 MED ORDER — FENTANYL CITRATE (PF) 100 MCG/2ML IJ SOLN
INTRAMUSCULAR | Status: AC
  Filled 2018-10-08: qty 2

## 2018-10-08 MED ORDER — ACETAMINOPHEN 500 MG PO TABS
1000.0000 mg | ORAL_TABLET | Freq: Once | ORAL | Status: AC
  Administered 2018-10-08: 1000 mg via ORAL

## 2018-10-08 MED ORDER — VITAMIN D 25 MCG (1000 UNIT) PO TABS
5000.0000 [IU] | ORAL_TABLET | Freq: Every day | ORAL | Status: DC
  Administered 2018-10-09 – 2018-10-10 (×2): 5000 [IU] via ORAL
  Filled 2018-10-08 (×2): qty 5

## 2018-10-08 MED ORDER — HYDROCODONE-ACETAMINOPHEN 7.5-325 MG PO TABS
1.0000 | ORAL_TABLET | ORAL | Status: DC | PRN
  Administered 2018-10-09: 2 via ORAL
  Filled 2018-10-08: qty 2

## 2018-10-08 MED ORDER — AMLODIPINE BESYLATE 5 MG PO TABS
5.0000 mg | ORAL_TABLET | Freq: Every day | ORAL | Status: DC
  Administered 2018-10-09 – 2018-10-10 (×2): 5 mg via ORAL
  Filled 2018-10-08 (×2): qty 1

## 2018-10-08 MED ORDER — LOSARTAN POTASSIUM 50 MG PO TABS
100.0000 mg | ORAL_TABLET | Freq: Every evening | ORAL | Status: DC
  Administered 2018-10-09: 100 mg via ORAL
  Filled 2018-10-08: qty 2

## 2018-10-08 MED ORDER — SODIUM CHLORIDE 0.9 % IR SOLN
Status: DC | PRN
  Administered 2018-10-08: 2000 mL

## 2018-10-08 MED ORDER — PROPOFOL 10 MG/ML IV BOLUS
INTRAVENOUS | Status: AC
  Filled 2018-10-08: qty 20

## 2018-10-08 MED ORDER — FLUTICASONE PROPIONATE 50 MCG/ACT NA SUSP
2.0000 | Freq: Every day | NASAL | Status: DC
  Administered 2018-10-09: 2 via NASAL
  Filled 2018-10-08: qty 16

## 2018-10-08 SURGICAL SUPPLY — 61 items
BASEPLATE TIBIAL RIGHT SZ 7 (Knees) ×1 IMPLANT
BLADE SAW 18WX90L 1.27 THK (BLADE) ×3 IMPLANT
BLADE SAW SAG 25X90X1.19 (BLADE) ×3 IMPLANT
BOWL CEMENT MIX W/ADAPTER (MISCELLANEOUS) ×3 IMPLANT
BRUSH SCRUB EZ  4% CHG (MISCELLANEOUS) ×2
BRUSH SCRUB EZ 4% CHG (MISCELLANEOUS) ×1 IMPLANT
CANISTER SUCT 1200ML W/VALVE (MISCELLANEOUS) ×3 IMPLANT
CANISTER SUCT 3000ML PPV (MISCELLANEOUS) ×6 IMPLANT
CEMENT BONE 1-PACK (Cement) ×6 IMPLANT
CHLORAPREP W/TINT 26ML (MISCELLANEOUS) ×6 IMPLANT
COMP PATELLA GENESIS 35MM (Stem) ×3 IMPLANT
COMPONENT PATELLA GENESIS 35MM (Stem) ×1 IMPLANT
COOLER POLAR GLACIER W/PUMP (MISCELLANEOUS) ×3 IMPLANT
COVER WAND RF STERILE (DRAPES) ×3 IMPLANT
CUFF TOURN 30 STER DUAL PORT (MISCELLANEOUS) ×3 IMPLANT
DRAPE INCISE IOBAN 66X60 STRL (DRAPES) ×3 IMPLANT
DRAPE SHEET LG 3/4 BI-LAMINATE (DRAPES) ×3 IMPLANT
ELECT REM PT RETURN 9FT ADLT (ELECTROSURGICAL) ×3
ELECTRODE REM PT RTRN 9FT ADLT (ELECTROSURGICAL) ×1 IMPLANT
FEM COMP OXINUM SZ7 RT (Knees) ×3 IMPLANT
FEMORAL COMP OXINUM SZ7 RT (Knees) ×1 IMPLANT
GAUZE PETRO XEROFOAM 1X8 (MISCELLANEOUS) ×3 IMPLANT
GAUZE SPONGE 4X4 12PLY STRL (GAUZE/BANDAGES/DRESSINGS) ×3 IMPLANT
GLOVE BIO SURGEON STRL SZ8 (GLOVE) ×3 IMPLANT
GLOVE BIOGEL PI IND STRL 8.5 (GLOVE) ×1 IMPLANT
GLOVE BIOGEL PI INDICATOR 8.5 (GLOVE) ×2
GLOVE INDICATOR 8.0 STRL GRN (GLOVE) ×3 IMPLANT
GLOVE SURG ORTHO 8.0 STRL STRW (GLOVE) ×9 IMPLANT
GOWN STRL REUS W/ TWL LRG LVL3 (GOWN DISPOSABLE) ×1 IMPLANT
GOWN STRL REUS W/ TWL XL LVL3 (GOWN DISPOSABLE) ×1 IMPLANT
GOWN STRL REUS W/TWL LRG LVL3 (GOWN DISPOSABLE) ×2
GOWN STRL REUS W/TWL XL LVL3 (GOWN DISPOSABLE) ×2
HOOD PEEL AWAY FLYTE STAYCOOL (MISCELLANEOUS) ×9 IMPLANT
INSERT ARTI HI FLEX 13 SZ7-8 (Insert) ×3 IMPLANT
IV NS 1000ML (IV SOLUTION) ×2
IV NS 1000ML BAXH (IV SOLUTION) ×1 IMPLANT
KIT PATIENT ADPT GUIDE RT F4 (DISPOSABLE) ×3 IMPLANT
KIT TURNOVER KIT A (KITS) ×3 IMPLANT
MAT ABSORB  FLUID 56X50 GRAY (MISCELLANEOUS) ×2
MAT ABSORB FLUID 56X50 GRAY (MISCELLANEOUS) ×1 IMPLANT
NDL SAFETY ECLIPSE 18X1.5 (NEEDLE) ×1 IMPLANT
NEEDLE HYPO 18GX1.5 SHARP (NEEDLE) ×2
NEEDLE SPNL 20GX3.5 QUINCKE YW (NEEDLE) ×3 IMPLANT
NS IRRIG 1000ML POUR BTL (IV SOLUTION) ×3 IMPLANT
PACK TOTAL KNEE (MISCELLANEOUS) ×3 IMPLANT
PAD DE MAYO PRESSURE PROTECT (MISCELLANEOUS) ×6 IMPLANT
PAD WRAPON POLAR KNEE (MISCELLANEOUS) ×1 IMPLANT
PULSAVAC PLUS IRRIG FAN TIP (DISPOSABLE) ×3
STAPLER SKIN PROX 35W (STAPLE) ×3 IMPLANT
SUCTION FRAZIER HANDLE 10FR (MISCELLANEOUS) ×2
SUCTION TUBE FRAZIER 10FR DISP (MISCELLANEOUS) ×1 IMPLANT
SUT DVC 2 QUILL PDO  T11 36X36 (SUTURE) ×2
SUT DVC 2 QUILL PDO T11 36X36 (SUTURE) ×1 IMPLANT
SUT VIC AB 2-0 CT1 18 (SUTURE) ×3 IMPLANT
SUT VIC AB 2-0 CT1 27 (SUTURE)
SUT VIC AB 2-0 CT1 TAPERPNT 27 (SUTURE) IMPLANT
SUT VIC AB PLUS 45CM 1-MO-4 (SUTURE) ×3 IMPLANT
SYR 30ML LL (SYRINGE) ×9 IMPLANT
TIBIAL BASEPLATE RIGHT SZ 7 (Knees) ×3 IMPLANT
TIP FAN IRRIG PULSAVAC PLUS (DISPOSABLE) ×1 IMPLANT
WRAPON POLAR PAD KNEE (MISCELLANEOUS) ×3

## 2018-10-08 NOTE — Anesthesia Procedure Notes (Signed)
Anesthesia Regional Block: Adductor canal block   Pre-Anesthetic Checklist: ,, timeout performed, Correct Patient, Correct Site, Correct Laterality, Correct Procedure, Correct Position, site marked, Risks and benefits discussed,  Surgical consent,  Pre-op evaluation,  At surgeon's request and post-op pain management  Laterality: Right  Prep: chloraprep       Needles:  Injection technique: Single-shot  Needle Type: Stimiplex     Needle Length: 10cm  Needle Gauge: 21     Additional Needles:   Procedures:,,,, ultrasound used (permanent image in chart),,,,  Narrative:  Start time: 10/08/2018 10:45 AM End time: 10/08/2018 10:50 AM Injection made incrementally with aspirations every 5 mL.  Performed by: Personally  Anesthesiologist: Emmie Niemann, MD  Additional Notes: Functioning IV was confirmed and monitors were applied.  A Stimuplex needle was used. Sterile prep and drape,hand hygiene and sterile gloves were used.  Negative aspiration and negative test dose prior to incremental administration of local anesthetic. The patient tolerated the procedure well.

## 2018-10-08 NOTE — NC FL2 (Signed)
[place] LEVEL OF CARE SCREENING TOOL     IDENTIFICATION  Patient Name: Kenneth Pruitt Birthdate: May 04, 1951 Sex: male Admission Date (Current Location): 10/08/2018  [place] and [place] Number:  Engineering geologist and Address:  [facility], [address] [place], [place] [address]      Provider Number: [id number]  Attending Physician Name and Address:  Lovell [name], MD  Relative Name and Phone Number:       Current Level of Care: Hospital Recommended Level of Care: [place] Prior Approval Number:    Date Approved/Denied:   PASRR Number: [id number])  Discharge Plan: SNF    Current Diagnoses: Patient Active Problem List   Diagnosis Date Noted  . S/P TKR (total knee replacement) using cement, right 10/08/2018  . Hypokalemia 09/30/2018  . CAD (coronary artery disease) 03/27/2018  . Unstable angina ([place]) 03/24/2018  . Skin lesions 10/16/2017  . BPH (benign prostatic hyperplasia) 10/16/2017  . Advanced care planning/counseling discussion 02/05/2017  . Polycythemia, secondary 08/28/2016  . Elevated hematocrit 08/07/2016  . Knee pain, right 08/06/2016  . Elevated PSA 08/06/2016  . Hypogonadism in male 11/17/2015  . Coronary artery disease 11/17/2015  . Depression 11/17/2015  . Hyperlipidemia 11/17/2015  . Essential hypertension 11/17/2015  . Erectile dysfunction 03/07/2015  . Arthritis of knee 12/18/2011  . Primary localized osteoarthrosis, lower leg 11/21/2011    Orientation RESPIRATION BLADDER Height & Weight     Self, Time, Situation, Place  O2(2 Liters Oxygen. ) Continent Weight: 235 lb (106.6 kg) Height:  6\' 1"  (185.4 cm)  BEHAVIORAL SYMPTOMS/MOOD NEUROLOGICAL BOWEL NUTRITION STATUS      Continent Diet(Diet: Regular )  AMBULATORY STATUS COMMUNICATION OF NEEDS Skin   Extensive Assist Verbally Surgical wounds(Incision: Right Knee. )                       Personal Care Assistance Level of  Assistance  Bathing, Feeding, Dressing Bathing Assistance: Limited assistance Feeding assistance: Independent Dressing Assistance: Limited assistance     Functional Limitations Info  Sight, Hearing, Speech Sight Info: Impaired Hearing Info: Adequate Speech Info: Adequate    SPECIAL CARE FACTORS FREQUENCY  PT (By licensed PT), OT (By licensed OT)     PT Frequency: (5) OT Frequency: (5)            Contractures      Additional Factors Info  Code Status, Allergies Code Status Info: (Full Code. ) Allergies Info: (Clonidine Derivatives)           Current Medications (10/08/2018):  This is the current hospital active medication list Current Facility-Administered Medications  Medication Dose Route Frequency Provider Last Rate Last Dose  . acetaminophen (TYLENOL) 500 MG tablet           . [START ON 10/09/2018] acetaminophen (TYLENOL) tablet 325-650 mg  325-650 mg Oral Q6H PRN Lovell [name], MD      . acetaminophen (TYLENOL) tablet 500 mg  500 mg Oral Q6H Lovell [name], MD   500 mg at 10/08/18 1230  . [START ON 10/09/2018] amLODipine (NORVASC) tablet 5 mg  5 mg Oral Daily Lovell [name], MD      . aspirin EC tablet 81 mg  81 mg Oral Daily Lovell [name], MD      . bisacodyl (DULCOLAX) suppository 10 mg  10 mg Rectal Daily PRN Lovell [name], MD      . Derrill Memo ON 10/09/2018] buPROPion [facility]) tablet  100 mg  100 mg Oral Daily Lovell [name], MD      . ceFAZolin (ANCEF) IVPB 2g/100 mL premix  2 g Intravenous Q6H Lovell [name], MD   Stopped at 10/08/18 1425  . cholecalciferol (VITAMIN D3) tablet 5,000 Units  5,000 Units Oral Daily Lovell [name], MD      . clopidogrel (PLAVIX) tablet 75 mg  75 mg Oral Daily Lovell [name], MD      . Derrill Memo ON 10/09/2018] docusate sodium (COLACE) capsule 100 mg  100 mg Oral BID Lovell [name], MD      . dutasteride (AVODART) capsule 0.5 mg  0.5 mg Oral QPM Lovell [name], MD      . escitalopram (LEXAPRO) tablet 15 mg  15 mg Oral QHS  Lovell [name], MD      . fluticasone (FLONASE) 50 MCG/ACT nasal spray 1 spray  1 spray Each Nare Daily PRN Lovell [name], MD      . fluticasone (FLONASE) 50 MCG/ACT nasal spray 2 spray  2 spray Each Nare Daily Lovell [name], MD      . gabapentin (NEURONTIN) 300 MG capsule           . gabapentin (NEURONTIN) capsule 300 mg  300 mg Oral TID Lovell [name], MD      . hydrALAZINE (APRESOLINE) tablet 100 mg  100 mg Oral QHS Lovell [name], MD      . hydrALAZINE (APRESOLINE) tablet 50 mg  50 mg Oral q morning - 10a Lovell [name], MD      . hydrochlorothiazide (HYDRODIURIL) tablet 25 mg  25 mg Oral Daily Lovell [name], MD      . HYDROcodone-acetaminophen (NORCO) 7.5-325 MG per tablet 1-2 tablet  1-2 tablet Oral Q4H PRN Lovell [name], MD      . HYDROcodone-acetaminophen (NORCO/VICODIN) 5-325 MG per tablet 1-2 tablet  1-2 tablet Oral Q4H PRN Lovell [name], MD      . hydrOXYzine (ATARAX/VISTARIL) tablet 25 mg  25 mg Oral BID PRN Lovell [name], MD      . ketorolac (TORADOL) 15 MG/ML injection 7.5 mg  7.5 mg Intravenous Q6H Lovell [name], MD   7.5 mg at 10/08/18 1230  . lactated ringers infusion   Intravenous Continuous Lovell [name], MD 75 mL/hr at 10/08/18 1500    . losartan (COZAAR) tablet 100 mg  100 mg Oral QPM Lovell [name], MD      . magnesium citrate solution 1 Bottle  1 Bottle Oral Once PRN Lovell [name], MD      . menthol-cetylpyridinium (CEPACOL) lozenge 3 mg  1 lozenge Oral PRN Lovell [name], MD       Or  . phenol (CHLORASEPTIC) mouth spray 1 spray  1 spray Mouth/Throat PRN Lovell [name], MD      . metoCLOPramide (REGLAN) tablet 5-10 mg  5-10 mg Oral Q8H PRN Lovell [name], MD       Or  . metoCLOPramide (REGLAN) injection 5-10 mg  5-10 mg Intravenous Q8H PRN Lovell [name], MD      . morphine 2 MG/ML injection 0.5-1 mg  0.5-1 mg Intravenous Q2H PRN Lovell [name], MD      . niacin (NIASPAN) CR tablet 2,000 mg  2,000 mg Oral QPM Lovell [name], MD      .  ondansetron [facility]) tablet 4 mg  4 mg Oral Q6H PRN Lovell [name], MD  Or  . ondansetron (ZOFRAN) injection 4 mg  4 mg Intravenous Q6H PRN Lovell [name], MD   4 mg at 10/08/18 1527  . [START ON 10/09/2018] pantoprazole (PROTONIX) EC tablet 20 mg  20 mg Oral Daily Lovell [name], MD      . Derrill Memo ON 10/09/2018] potassium chloride SA (K-DUR,KLOR-CON) CR tablet 20 mEq  20 mEq Oral Daily Lovell [name], MD      . rosuvastatin (CRESTOR) tablet 20 mg  20 mg Oral QPM Lovell [name], MD      . Derrill Memo ON 10/09/2018] tamsulosin (FLOMAX) capsule 0.4 mg  0.4 mg Oral Daily Lovell [name], MD      . Derrill Memo ON 10/09/2018] vitamin B-12 (CYANOCOBALAMIN) tablet 1,000 mcg  1,000 mcg Oral Daily Lovell [name], MD         Discharge Medications: Please see discharge summary for a list of discharge medications.  Relevant Imaging Results:  Relevant Lab Results:   Additional Information (SSN: [id number])  Guerin Lashomb, Veronia Beets, LCSW

## 2018-10-08 NOTE — Anesthesia Preprocedure Evaluation (Addendum)
Anesthesia Evaluation  Patient identified by MRN, date of birth, ID band Patient awake    Reviewed: Allergy & Precautions, NPO status , Patient's Chart, lab work & pertinent test results  History of Anesthesia Complications Negative for: history of anesthetic complications  Airway Mallampati: II  TM Distance: >3 FB Neck ROM: Full    Dental no notable dental hx.    Pulmonary neg sleep apnea, neg COPD, former smoker,    breath sounds clear to auscultation- rhonchi (-) wheezing      Cardiovascular hypertension, Pt. on medications (-) angina+ CAD, + Past MI and + Cardiac Stents (2007, 2019)  (-) CABG  Rhythm:Regular Rate:Normal - Systolic murmurs and - Diastolic murmurs    Neuro/Psych neg Seizures PSYCHIATRIC DISORDERS Depression negative neurological ROS     GI/Hepatic negative GI ROS, Neg liver ROS,   Endo/Other  negative endocrine ROSneg diabetes  Renal/GU negative Renal ROS     Musculoskeletal  (+) Arthritis ,   Abdominal (+) + obese,   Peds  Hematology negative hematology ROS (+)   Anesthesia Other Findings Past Medical History: No date: Arthritis No date: Coronary artery disease No date: Depression No date: Hyperlipidemia   Reproductive/Obstetrics                             Anesthesia Physical Anesthesia Plan  ASA: III  Anesthesia Plan: General   Post-op Pain Management:  Regional for Post-op pain   Induction: Intravenous  PONV Risk Score and Plan: 1 and Ondansetron, Dexamethasone and Midazolam  Airway Management Planned: Oral ETT  Additional Equipment:   Intra-op Plan:   Post-operative Plan: Extubation in OR  Informed Consent: I have reviewed the patients History and Physical, chart, labs and discussed the procedure including the risks, benefits and alternatives for the proposed anesthesia with the patient or authorized representative who has indicated his/her  understanding and acceptance.     Dental advisory given  Plan Discussed with: CRNA and Anesthesiologist  Anesthesia Plan Comments:        Anesthesia Quick Evaluation

## 2018-10-08 NOTE — H&P (Signed)
The patient has been re-examined, and the chart reviewed, and there have been no interval changes to the documented history and physical.  Plan a right total knee today.  Anesthesia is consulted regarding a peripheral nerve block for post-operative pain.  The risks, benefits, and alternatives have been discussed at length, and the patient is willing to proceed.     

## 2018-10-08 NOTE — Anesthesia Postprocedure Evaluation (Signed)
Anesthesia Post Note  Patient: Kenneth Pruitt  Procedure(s) Performed: TOTAL KNEE ARTHROPLASTY (Right Knee)  Patient location during evaluation: PACU Anesthesia Type: General Level of consciousness: awake and alert and oriented Pain management: pain level controlled Vital Signs Assessment: post-procedure vital signs reviewed and stable Respiratory status: spontaneous breathing, nonlabored ventilation and respiratory function stable Cardiovascular status: blood pressure returned to baseline and stable Postop Assessment: no signs of nausea or vomiting Anesthetic complications: no     Last Vitals:  Vitals:   10/08/18 1059 10/08/18 1116  BP: 111/64 115/65  Pulse: 91 88  Resp: 17 16  Temp:    SpO2: 94% 95%    Last Pain:  Vitals:   10/08/18 1116  TempSrc:   PainSc: Asleep                 Veryl Abril

## 2018-10-08 NOTE — Care Management Note (Signed)
Case Management Note  Patient Details  Name: Kenneth Pruitt MRN: [id number] Date of Birth: 08-07-1951  Subjective/Objective:                   Met with patient and his wife to discuss DC plan Patient states that he does not have a RW, notified Corene Cornea with [facility] that the patient needs on Patient anticipates being able to do outpatient PT, already has appointment scheduled for next Tuesday and 2 times a week thru Feb. Patient states that his home he has stairs and the bedrooms and bathrooms are all upstairs. He rides a stationary bike daily and yesterday did 18 miles.  I encouraged him to get clearance with PT before getting back on the bike.  He agreed The patient was provided with the [facility] list as per CMS.gov and states he doesn't feel he will need it but will wait on PT recommendation. Patient states that he takes Plavix blood thinner and does not anticipate needing Lovenox Continue to monitor for DC needs Action/Plan:  [facility] list provided to patient per CMS.gov RW needed , notified [facility] jason of the need Expected Discharge Date:  10/10/18               Expected Discharge Plan:     In-House Referral:     Discharge planning Services  CM Consult  Post Acute Care Choice:  Durable Medical Equipment Choice offered to:     DME Arranged:  [place] [facility] DME Agency:     HH Arranged:    [place] Agency:     Status of Service:  In process, will continue to follow  If discussed at Long Length of Stay Meetings, dates discussed:    Additional Comments:  Su Hilt, RN 10/08/2018, 11:53 AM

## 2018-10-08 NOTE — Anesthesia Procedure Notes (Signed)
Procedure Name: Intubation Date/Time: 10/08/2018 7:46 AM Performed by: Eben Burow, CRNA Pre-anesthesia Checklist: Patient identified, Emergency Drugs available, Suction available and Patient being monitored Patient Re-evaluated:Patient Re-evaluated prior to induction Oxygen Delivery Method: Circle system utilized Preoxygenation: Pre-oxygenation with 100% oxygen Induction Type: IV induction Ventilation: Oral airway inserted - appropriate to patient size and Mask ventilation without difficulty Laryngoscope Size: Mac and 4 Grade View: Grade I Tube type: Oral Tube size: 7.5 mm Number of attempts: 1 Airway Equipment and Method: Stylet and LTA kit utilized Placement Confirmation: ETT inserted through vocal cords under direct vision,  positive ETCO2 and breath sounds checked- equal and bilateral Secured at: 23 cm Tube secured with: Tape Dental Injury: Teeth and Oropharynx as per pre-operative assessment

## 2018-10-08 NOTE — Anesthesia Post-op Follow-up Note (Signed)
Anesthesia QCDR form completed.        

## 2018-10-08 NOTE — Op Note (Signed)
DATE OF SURGERY:  10/08/2018 TIME: 9:43 AM  PATIENT NAME:  Kenneth Pruitt   AGE: 68 y.o.    PRE-OPERATIVE DIAGNOSIS:  OSTEOARTHRITIS OF RIGHT KNEE  POST-OPERATIVE DIAGNOSIS:  Same  PROCEDURE:  Procedure(s): TOTAL KNEE ARTHROPLASTY, Right  SURGEON:  Lovell [name], MD   ASSISTANT:  Carlynn Spry, PA-C  OPERATIVE IMPLANTS: Tamala [employer], Cruciate Retaining Oxinium Femoral component size  7, Fixed Bearing Tray size 7, Patella polyethylene 3-peg oval button size 35 mm, with a 13 mm HighFlex insert.   PREOPERATIVE INDICATIONS:  Kenneth Pruitt is an 68 y.o. male who has a diagnosis of OSTEOARTHRITIS OF RIGHT KNEE and elected for a right total knee arthroplasty after failing nonoperative treatment, including activity modification, pain medication, physical therapy and injections who has significant impairment of their activities of daily living.  Radiographs have demonstrated tricompartmental osteoarthritis joint space narrowing, osteophytes, subchondral sclerosis and cyst formation.  The risks, benefits, and alternatives were discussed at length including but not limited to the risks of infection, bleeding, nerve or blood vessel injury, knee stiffness, fracture, dislocation, loosening or failure of the hardware and the need for further surgery. Medical risks include but not limited to DVT and pulmonary embolism, myocardial infarction, stroke, pneumonia, respiratory failure and death. I discussed these risks with the patient in my office prior to the date of surgery. They understood these risks and were willing to proceed.  OPERATIVE FINDINGS AND UNIQUE ASPECTS OF THE CASE:  All three compartments with advanced and severe degenerative changes, large osteophytes and an abundance of synovial fluid. Significant deformity was also noted. A decision was made to proceed with total knee arthroplasty.   OPERATIVE DESCRIPTION:  The patient was brought to the operative room and placed in a supine  position after undergoing placement of a general anesthetic. IV antibiotics were given. Patient received tranexamic acid. The lower extremity was prepped and draped in the usual sterile fashion.  A time out was performed to verify the patient's name, date of birth, medical record number, correct site of surgery and correct procedure to be performed. The timeout was also used to confirm the patient received antibiotics and that appropriate instruments, implants and radiographs studies were available in the room.  The leg was elevated and exsanguinated with an Esmarch and the tourniquet was inflated to 250 mmHg.  A midline incision was made over the left knee.. A medial parapatellar arthrotomy was then made and the patella subluxed laterally and the knee was brought into 90 of flexion. Hoffa's fat pad along with the anterior cruciate ligament was resected and the medial joint line was exposed.  Attention was then turned to preparation of the patella. The thickness of the patella was measured with a caliper, the diameter measured with the patella templates.  The patella resection was then made with an oscillating saw using the patella cutting guide.  The 35 mm button fit appropriately.  3 peg holes for the patella component were then drilled.  The extramedullary tibial cutting guide was then placed using the anterior tibial crest and second ray of the foot as a reference.  The tibial cutting guide was adjusted to allow for appropriate posterior slope.  The tibial cutting block was pinned into position. The slotted stylus was used to measure the proximal tibial resection of 9 mm off the high lateral side. Care was taken during the tibial resection to protect the medial and collateral ligaments.  The resected tibial bone was removed.  The distal femur  was resected using the Visionaire cutting guide.  Care was taken to protect the collateral ligaments during distal femoral resection.  The distal femoral resection  was performed with an oscillating saw. The femoral cutting guide was then removed. Extension gap was measured with a 13 mm spacer block and alignment and extension was confirmed using a long alignment rod. The femur was sized to be a 7. Rotation of the referencing guide was checked with the epicondylar axis and Whitesides line. Then the 4-in-1 cutting jig was then applied to the distal femur. A stylus was used to confirm that the anterior femur would not be notched.   Then the anterior, posterior and chamfer femoral cuts were then made with an oscillating saw.  The knee was distracted and all posterior osteophytes were removed.  The flexion gap was then measured with a flexion spacer block and long alignment rod and was found to be symmetric with the extension gap and perpendicular to mechanical axis of the tibia.  The proximal tibia plateau was then sized with trial trays. The best coverage was achieved with a size 7. This tibial tray was then pinned into position. The proximal tibia was then prepared with the keel punch.  After tibial preparation was completed, all trial components were inserted with polyethylene trials. The knee achieved full extension and flexed to 120 degrees. Ligament were stable to varus and valgus at full extension as well as 30, 60 and 90 degrees of flexion.   The trials were then placed. Knee was taken through a full range of motion and deemed to be stable with the trial components. All trial components were then removed.  The joint was copiously irrigated with pulse lavage.  The final total knee arthroplasty components were then cemented into place. The knee was held in extension while cement was allowed to cure.The knee was taken through a range of motion and the patella tracked well and the knee was again irrigated copiously.  The knee capsule was then injected with Exparel.  The medial arthrotomy was closed with #1 Vicryl and #2 Quill. The subcutaneous tissue closed with  2-0  vicryl, and skin approximated with staples.  A dry sterile and compressive dressing was applied.  A Polar Care was applied to the operative knee.  The patient was awakened and brought to the PACU in stable and satisfactory condition.  All sharp, lap and instrument counts were correct at the conclusion the case. I spoke with the patient's family in the postop consultation room to let them know the case had been performed without complication and the patient was stable in recovery room.   Total tourniquet time was 52 minutes.

## 2018-10-08 NOTE — Transfer of Care (Signed)
Immediate Anesthesia Transfer of Care Note  Patient: Kenneth Pruitt  Procedure(s) Performed: TOTAL KNEE ARTHROPLASTY (Right Knee)  Patient Location: PACU  Anesthesia Type:General  Level of Consciousness: drowsy  Airway & Oxygen Therapy: Patient Spontanous Breathing and Patient connected to face mask oxygen  Post-op Assessment: Report given to RN and Post -op Vital signs reviewed and stable  Post vital signs: Reviewed and stable  Last Vitals:  Vitals Value Taken Time  BP 85/56 10/08/2018  9:49 AM  Temp    Pulse 92 10/08/2018  9:52 AM  Resp 7 10/08/2018  9:52 AM  SpO2 95 % 10/08/2018  9:52 AM  Vitals shown include unvalidated device data.  Last Pain:  Vitals:   10/08/18 0625  TempSrc: Oral  PainSc: 0-No pain         Complications: No apparent anesthesia complications

## 2018-10-08 NOTE — Evaluation (Signed)
Physical Therapy Evaluation Patient Details Name: Kenneth Pruitt MRN: [id number] DOB: 12-08-1950 Today's Date: 10/08/2018   History of Present Illness  Pt is a 68 year old male s/p R TKA.  PMH includes CAD, HLD, depression and arthritis.  Clinical Impression  Pt in bed and reporting nausea upon PT arrival.  He had just walked to restroom with nursing with RW.  Pt willing to work with therapy and reporting no pain.  He was able to perform 10 quad sets and SLR's without difficulty and demonstrated 4-85 R knee extension/flexion.  Pt reported good sensation return of feet.  Pt and wife open to education regarding polar care, WB status and management of HEP.  Pt will continue to benefit from skilled PT with focus on strength, ROM, tolerance to activity and use of AD.      Follow Up Recommendations Outpatient PT    Equipment Recommendations  Rolling walker with 5" wheels    Recommendations for Other Services       Precautions / Restrictions Restrictions Weight Bearing Restrictions: Yes      Mobility  Bed Mobility Overal bed mobility: (Did not perform due to pt nausea.  Pt had just walked to restroom with nursing.)                Transfers                    Ambulation/Gait                Stairs            Wheelchair Mobility    Modified Rankin (Stroke Patients Only)       Balance                                             Pertinent Vitals/Pain Pain Assessment: No/denies pain    Home Living Family/patient expects to be discharged to:: Private residence Living Arrangements: Spouse/significant other Available Help at Discharge: Family Type of Home: House Home Access: Stairs to enter   Technical brewer of Steps: 7 Home Layout: Two level Home Equipment: None      Prior Function Level of Independence: Independent         Comments: Community ambulator: cycles for exercise     Hand Dominance         Extremity/Trunk Assessment   Upper Extremity Assessment Upper Extremity Assessment: Overall WFL for tasks assessed    Lower Extremity Assessment Lower Extremity Assessment: Overall WFL for tasks assessed    Cervical / Trunk Assessment Cervical / Trunk Assessment: Normal  Communication   Communication: No difficulties  Cognition Arousal/Alertness: Awake/alert Behavior During Therapy: WFL for tasks assessed/performed Overall Cognitive Status: Within Functional Limits for tasks assessed                                 General Comments: Pt with nursing and reporting nausea when PT entered room.  Eager to work with therapy in whatever way possible.      General Comments      Exercises Total Joint Exercises Ankle Circles/Pumps: 20 reps;Strengthening;Both;Supine Quad Sets: Right;10 reps;Supine Straight Leg Raises: Strengthening;Right;10 reps;Supine Goniometric ROM: R knee ext/flex: 4-80 degrees in supine Other Exercises Other Exercises: Educated on use of Polar Care x2 min Other Exercises: Educated regarding  management of HEP and return to cycling x4 min   Assessment/Plan    PT Assessment Patient needs continued PT services  PT Problem List Decreased strength;Decreased mobility;Decreased activity tolerance;Pain       PT Treatment Interventions DME instruction;Functional mobility training;Patient/family education;Gait training;Therapeutic activities;Balance training;Stair training;Therapeutic exercise;Neuromuscular re-education    PT Goals (Current goals can be found in the Care Plan section)  Acute Rehab PT Goals Patient Stated Goal: To return to cycling as soon as possible. PT Goal Formulation: With patient Time For Goal Achievement: 10/22/18 Potential to Achieve Goals: Good    Frequency BID   Barriers to discharge        Co-evaluation               AM-PAC PT "6 Clicks" Mobility  Outcome Measure Help needed turning from your back to your  side while in a flat bed without using bedrails?: None Help needed moving from lying on your back to sitting on the side of a flat bed without using bedrails?: None Help needed moving to and from a bed to a chair (including a wheelchair)?: A Little Help needed standing up from a chair using your arms (e.g., wheelchair or bedside chair)?: A Little Help needed to walk in hospital room?: A Little Help needed climbing 3-5 steps with a railing? : A Little 6 Click Score: 20    End of Session   Activity Tolerance: Other (comment)(Limited by nausea) Patient left: in bed;with call bell/phone within reach;with bed alarm set;with family/visitor present Nurse Communication: Mobility status PT Visit Diagnosis: Muscle weakness (generalized) (M62.81);Pain Pain - Right/Left: Right Pain - part of body: Knee    Time: 1520-1540 PT Time Calculation (min) (ACUTE ONLY): 20 min   Charges:   PT Evaluation $PT Eval Low Complexity: 1 Low PT Treatments $Therapeutic Exercise: 8-22 mins        Roxanne Gates, PT, DPT   Roxanne Gates 10/08/2018, 4:23 PM

## 2018-10-09 DIAGNOSIS — M1711 Unilateral primary osteoarthritis, right knee: Secondary | ICD-10-CM | POA: Diagnosis not present

## 2018-10-09 LAB — BASIC METABOLIC PANEL
Anion gap: 8 (ref 5–15)
BUN: 36 mg/dL — ABNORMAL HIGH (ref 8–23)
CO2: 27 mmol/L (ref 22–32)
Calcium: 8.4 mg/dL — ABNORMAL LOW (ref 8.9–10.3)
Chloride: 102 mmol/L (ref 98–111)
Creatinine, Ser: 1.47 mg/dL — ABNORMAL HIGH (ref 0.61–1.24)
GFR calc Af Amer: 56 mL/min — ABNORMAL LOW (ref >60)
GFR calc non Af Amer: 49 mL/min — ABNORMAL LOW (ref >60)
Glucose, Bld: 123 mg/dL — ABNORMAL HIGH (ref 70–99)
Potassium: 3.7 mmol/L (ref 3.5–5.1)
Sodium: 137 mmol/L (ref 135–145)

## 2018-10-09 LAB — CBC
HCT: 42.3 % (ref 39.0–52.0)
HEMOGLOBIN: 14 g/dL (ref 13.0–17.0)
MCH: 30 pg (ref 26.0–34.0)
MCHC: 33.1 g/dL (ref 30.0–36.0)
MCV: 90.6 fL (ref 80.0–100.0)
Platelets: 193 10*3/uL (ref 150–400)
RBC: 4.67 MIL/uL (ref 4.22–5.81)
RDW: 14.7 % (ref 11.5–15.5)
WBC: 14.9 10*3/uL — ABNORMAL HIGH (ref 4.0–10.5)
nRBC: 0 % (ref 0.0–0.2)

## 2018-10-09 NOTE — Progress Notes (Signed)
  Subjective:  Patient reports pain as moderate.    Objective:   VITALS:   Vitals:   10/08/18 1604 10/08/18 1936 10/08/18 2349 10/09/18 0425  BP: 127/69 130/68 126/75 131/68  Pulse: 82 82 76 72  Resp: 18 18 17 17   Temp: 98.6 F (37 C) 98.2 F (36.8 C) (!) 97.5 F (36.4 C)   TempSrc:  Oral Oral   SpO2: 98% 97% 99% 99%  Weight:      Height:        PHYSICAL EXAM:  Neurovascular intact Intact pulses distally Dorsiflexion/Plantar flexion intact Incision: dressing C/D/I Compartment soft  LABS  Results for orders placed or performed during the hospital encounter of 10/08/18 (from the past 24 hour(s))  Basic metabolic panel     Status: Abnormal   Collection Time: 10/09/18  3:48 AM  Result Value Ref Range   Sodium 137 135 - 145 mmol/L   Potassium 3.7 3.5 - 5.1 mmol/L   Chloride 102 98 - 111 mmol/L   CO2 27 22 - 32 mmol/L   Glucose, Bld 123 (H) 70 - 99 mg/dL   BUN 36 (H) 8 - 23 mg/dL   Creatinine, Ser 1.47 (H) 0.61 - 1.24 mg/dL   Calcium 8.4 (L) 8.9 - 10.3 mg/dL   GFR calc non Af Amer 49 (L) >60 mL/min   GFR calc Af Amer 56 (L) >60 mL/min   Anion gap 8 5 - 15  CBC     Status: Abnormal   Collection Time: 10/09/18  3:48 AM  Result Value Ref Range   WBC 14.9 (H) 4.0 - 10.5 K/uL   RBC 4.67 4.22 - 5.81 MIL/uL   Hemoglobin 14.0 13.0 - 17.0 g/dL   HCT 42.3 39.0 - 52.0 %   MCV 90.6 80.0 - 100.0 fL   MCH 30.0 26.0 - 34.0 pg   MCHC 33.1 30.0 - 36.0 g/dL   RDW 14.7 11.5 - 15.5 %   Platelets 193 150 - 400 K/uL   nRBC 0.0 0.0 - 0.2 %    Dg Knee Right Port  Result Date: 10/08/2018 CLINICAL DATA:  Status post right knee replacement today. EXAM: PORTABLE RIGHT KNEE - 1-2 VIEW COMPARISON:  MRI right knee 01/22/2008. FINDINGS: The patient has a new right total knee arthroplasty. The device is located. No fracture or other acute bony abnormality. Gas in the soft tissues and surgical staples noted. IMPRESSION: Status post right knee replacement.  No acute abnormality. Electronically  Signed   By: Inge Rise M.D.   On: 10/08/2018 10:06    Assessment/Plan: 1 Day Post-Op   Active Problems:   S/P TKR (total knee replacement) using cement, right   Up with therapy Plan for discharge tomorrow   Lovell [name] , MD 10/09/2018, 8:15 AM

## 2018-10-09 NOTE — Progress Notes (Signed)
Physical Therapy Treatment Patient Details Name: Kenneth Pruitt MRN: [id number] DOB: 1951/07/29 Today's Date: 10/09/2018    History of Present Illness Pt is a 68 year old male s/p R TKA.  PMH includes CAD, HLD, depression and arthritis.    PT Comments    Pt ready to work with therapy this morning and able to perform bed mobility mid I.  Pt stood with RW, SBA and no difficulty.  When sitting at bedside, pt noted increased bleeding through dressing and RN was notified for dressing change.  PT provided education regarding HEP and allowed time to answer questions.  Pt reported feeling "woozy" and returned to bed.  BP was WNL.  Pt able to perform there ex during the treatment and presented with good knee ext/flex ROM.  He will continue to benefit from skilled PT with focus on strength, tolerance to activity, functional mobility, knee ROM and pain management.  Follow Up Recommendations  Outpatient PT     Equipment Recommendations  Rolling walker with 5" wheels    Recommendations for Other Services       Precautions / Restrictions Precautions Precautions: Knee;Fall Precaution Booklet Issued: Yes (comment) Restrictions Weight Bearing Restrictions: Yes    Mobility  Bed Mobility Overal bed mobility: Modified Independent                Transfers Overall transfer level: Needs assistance Equipment used: Rolling walker (2 wheeled) Transfers: Sit to/from Stand Sit to Stand: Min assist         General transfer comment: Able to stand from bedside without difficulty.  PT had pt to sit back down to adjust RW height and pt noticed new bleeding at proximal end of bandage.  RN notified.  Pt reported feeling "woozy" and returned to supine.  BP WNL.  Ambulation/Gait                 Stairs             Wheelchair Mobility    Modified Rankin (Stroke Patients Only)       Balance Overall balance assessment: Modified Independent                                          Cognition Arousal/Alertness: Awake/alert Behavior During Therapy: WFL for tasks assessed/performed Overall Cognitive Status: Within Functional Limits for tasks assessed                                 General Comments: Awake and ready to work with therapy      Exercises Total Joint Exercises Ankle Circles/Pumps: 20 reps;Both;Seated Knee Flexion: AAROM;Right;10 reps;Seated Goniometric ROM: R knee ext/flex: 4-90 Other Exercises Other Exercises: Time to assess dressing and monitor pt vitals while waiting for RN to prep dressing change x7 min Other Exercises: Issued and reviewed HEP exercises x4 min    General Comments        Pertinent Vitals/Pain      Home Living                      Prior Function            PT Goals (current goals can now be found in the care plan section) Acute Rehab PT Goals Patient Stated Goal: To return to cycling as soon as possible. PT  Goal Formulation: With patient Time For Goal Achievement: 10/22/18 Potential to Achieve Goals: Good Progress towards PT goals: Progressing toward goals    Frequency    BID      PT Plan Current plan remains appropriate    Co-evaluation              AM-PAC PT "6 Clicks" Mobility   Outcome Measure  Help needed turning from your back to your side while in a flat bed without using bedrails?: None Help needed moving from lying on your back to sitting on the side of a flat bed without using bedrails?: None Help needed moving to and from a bed to a chair (including a wheelchair)?: A Little Help needed standing up from a chair using your arms (e.g., wheelchair or bedside chair)?: A Little Help needed to walk in hospital room?: A Little Help needed climbing 3-5 steps with a railing? : A Little 6 Click Score: 20    End of Session Equipment Utilized During Treatment: Gait belt Activity Tolerance: Other (comment)(Limited by nausea and time needed to perform  dressing change) Patient left: in bed;with family/visitor present;with nursing/sitter in room Nurse Communication: Mobility status PT Visit Diagnosis: Muscle weakness (generalized) (M62.81);Pain Pain - Right/Left: Right Pain - part of body: Knee     Time: 9629-5284 PT Time Calculation (min) (ACUTE ONLY): 25 min  Charges:  $Therapeutic Exercise: 8-22 mins $Therapeutic Activity: 8-22 mins                     Roxanne Gates, PT, DPT    Roxanne Gates 10/09/2018, 10:23 AM

## 2018-10-09 NOTE — Progress Notes (Signed)
Pt bled through bulky dressing during PT session. This was removed by writer, cleansed with sterile saline and aquacel dressing applied and wrapped with ace wrap. Bowers aware.   Bethann Punches, RN

## 2018-10-09 NOTE — Progress Notes (Signed)
Physical Therapy Treatment Patient Details Name: Kenneth Pruitt MRN: [id number] DOB: [birth year] Today's Date: 10/09/2018    History of Present Illness Pt is a 68 year old male s/p R TKA.  PMH includes CAD, HLD, depression and arthritis.    PT Comments    Pt able to progress to 150 ft of ambulation and completion of ascending/descending 4 steps today. PT provided education for use of RW, step pattern and foot placement on steps and pt was able to complete without report of increased pain.  Pt demonstrated proficiency with supine there ex and did not report pain increase.  Pt able to perform toileting and standing at sink to perform functional activity with supervision only.  Pt will continue to benefit from skilled PT with focus on safe functional mobility, strength, knee ROM and pain management.   Follow Up Recommendations  Outpatient PT     Equipment Recommendations  Rolling walker with 5" wheels    Recommendations for Other Services       Precautions / Restrictions Precautions Precautions: Knee;Fall Precaution Booklet Issued: Yes (comment) Restrictions Weight Bearing Restrictions: Yes    Mobility  Bed Mobility Overal bed mobility: Modified Independent                Transfers Overall transfer level: Needs assistance Equipment used: Rolling walker (2 wheeled) Transfers: Sit to/from Stand Sit to Stand: Supervision         General transfer comment: Able to stand without assistance and with good use of RW.  Ambulation/Gait Ambulation/Gait assistance: Min guard Gait Distance (Feet): 150 Feet Assistive device: Rolling walker (2 wheeled)     Gait velocity interpretation: >2.62 ft/sec, indicative of community ambulatory General Gait Details: step through gait with fair foot clearance and good step length.  Min VC's for proper use of RW.   Stairs Stairs: Yes Stairs assistance: Min guard Stair Management: One rail Right;Step to pattern Number of Stairs:  4 General stair comments: Good knowledge of step pattern and leading LE, VC's to keep foot close to step, no report of pain increase   Wheelchair Mobility    Modified Rankin (Stroke Patients Only)       Balance Overall balance assessment: Modified Independent                                          Cognition Arousal/Alertness: Awake/alert Behavior During Therapy: WFL for tasks assessed/performed Overall Cognitive Status: Within Functional Limits for tasks assessed                                 General Comments: Awake and ready to work with therapy      Exercises Total Joint Exercises Ankle Circles/Pumps: 20 reps;Strengthening;Both;Supine Quad Sets: Right;Supine;15 reps Short Arc Quad: Strengthening;Right;15 reps;Supine Hip ABduction/ADduction: Strengthening;Right;15 reps;Supine Straight Leg Raises: Strengthening;Right;10 reps;Supine Knee Flexion: AAROM;Right;10 reps;Seated Goniometric ROM: R knee ext/flex: 4-90 Other Exercises Other Exercises: Assistance with positioning R LE after returning to bed. x2 min Other Exercises: Time and assistance for toileting and standing at sink to perform ADL's x4 min    General Comments        Pertinent Vitals/Pain Pain Assessment: No/denies pain    Home Living                      Prior Function  PT Goals (current goals can now be found in the care plan section) Acute Rehab PT Goals Patient Stated Goal: To return to cycling as soon as possible. PT Goal Formulation: With patient Time For Goal Achievement: 10/22/18 Potential to Achieve Goals: Good Progress towards PT goals: Progressing toward goals    Frequency    BID      PT Plan Current plan remains appropriate    Co-evaluation              AM-PAC PT "6 Clicks" Mobility   Outcome Measure  Help needed turning from your back to your side while in a flat bed without using bedrails?: None Help needed  moving from lying on your back to sitting on the side of a flat bed without using bedrails?: None Help needed moving to and from a bed to a chair (including a wheelchair)?: A Little Help needed standing up from a chair using your arms (e.g., wheelchair or bedside chair)?: A Little Help needed to walk in hospital room?: A Little Help needed climbing 3-5 steps with a railing? : A Little 6 Click Score: 20    End of Session Equipment Utilized During Treatment: Gait belt Activity Tolerance: Patient tolerated treatment well Patient left: in bed;with family/visitor present;with nursing/sitter in room Nurse Communication: Mobility status PT Visit Diagnosis: Muscle weakness (generalized) (M62.81);Pain Pain - Right/Left: Right Pain - part of body: Knee     Time: 6599-3570 PT Time Calculation (min) (ACUTE ONLY): 24 min  Charges:  $Gait Training: 8-22 mins $Therapeutic Exercise: 8-22 mins $Therapeutic Activity: 8-22 mins                     Roxanne Gates, PT, DPT    Roxanne Gates 10/09/2018, 1:52 PM

## 2018-10-10 ENCOUNTER — Encounter: Payer: Self-pay | Admitting: Orthopedic Surgery

## 2018-10-10 DIAGNOSIS — M1711 Unilateral primary osteoarthritis, right knee: Secondary | ICD-10-CM | POA: Diagnosis not present

## 2018-10-10 LAB — CBC
HCT: 40 % (ref 39.0–52.0)
Hemoglobin: 13 g/dL (ref 13.0–17.0)
MCH: 30.1 pg (ref 26.0–34.0)
MCHC: 32.5 g/dL (ref 30.0–36.0)
MCV: 92.6 fL (ref 80.0–100.0)
Platelets: 186 10*3/uL (ref 150–400)
RBC: 4.32 MIL/uL (ref 4.22–5.81)
RDW: 14.7 % (ref 11.5–15.5)
WBC: 10.3 10*3/uL (ref 4.0–10.5)
nRBC: 0 % (ref 0.0–0.2)

## 2018-10-10 MED ORDER — HYDROCODONE-ACETAMINOPHEN 7.5-325 MG PO TABS: 1.0000 | ORAL_TABLET | ORAL | 0 refills | Status: DC | PRN

## 2018-10-10 MED ORDER — DOCUSATE SODIUM 100 MG PO CAPS: 100.0000 mg | ORAL_CAPSULE | Freq: Two times a day (BID) | ORAL | 0 refills | Status: DC

## 2018-10-10 NOTE — Progress Notes (Signed)
  Subjective:  Patient reports pain as mild.    Objective:   VITALS:   Vitals:   10/09/18 1630 10/09/18 2042 10/09/18 2326 10/10/18 0424  BP: (!) 141/67 124/60 128/63   Pulse: 79  82   Resp: 18  16   Temp: 98.9 F (37.2 C)  98.6 F (37 C)   TempSrc: Oral     SpO2: 98%  96% 97%  Weight:      Height:        PHYSICAL EXAM:  Neurologically intact ABD soft Neurovascular intact Sensation intact distally Intact pulses distally Dorsiflexion/Plantar flexion intact Incision: no drainage  LABS  Results for orders placed or performed during the hospital encounter of 10/08/18 (from the past 24 hour(s))  CBC     Status: None   Collection Time: 10/10/18  4:45 AM  Result Value Ref Range   WBC 10.3 4.0 - 10.5 K/uL   RBC 4.32 4.22 - 5.81 MIL/uL   Hemoglobin 13.0 13.0 - 17.0 g/dL   HCT 40.0 39.0 - 52.0 %   MCV 92.6 80.0 - 100.0 fL   MCH 30.1 26.0 - 34.0 pg   MCHC 32.5 30.0 - 36.0 g/dL   RDW 14.7 11.5 - 15.5 %   Platelets 186 150 - 400 K/uL   nRBC 0.0 0.0 - 0.2 %    Dg Knee Right Port  Result Date: 10/08/2018 CLINICAL DATA:  Status post right knee replacement today. EXAM: PORTABLE RIGHT KNEE - 1-2 VIEW COMPARISON:  MRI right knee 01/22/2008. FINDINGS: The patient has a new right total knee arthroplasty. The device is located. No fracture or other acute bony abnormality. Gas in the soft tissues and surgical staples noted. IMPRESSION: Status post right knee replacement.  No acute abnormality. Electronically Signed   By: Inge Rise M.D.   On: 10/08/2018 10:06    Assessment/Plan: 2 Days Post-Op   Active Problems:   S/P TKR (total knee replacement) using cement, right   Advance diet Up with therapy  Discharge home today after PT Outpatient PT to follow on Tuesday   Carlynn Spry , PA-C 10/10/2018, 7:08 AM

## 2018-10-10 NOTE — Discharge Instructions (Signed)
Continue weight bear as tolerated on the right lower extremity.    Elevate the right lower extremity whenever possible and continue the polar care while elevating the extremity. Patient may shower. No bath or submerging the wound.    Take Plavix and ASA as directed for blood clot prevention.  Continue to work on knee range of motion exercises at home as instructed by physical therapy. Continue to use a walker for assistance with ambulation until cleared by physical therapy.  Call [phone] with any questions, such as fever > 101.5 degrees, drainage from the wound or shortness of breath.

## 2018-10-10 NOTE — Progress Notes (Signed)
Patient discharged home. IV removed. Discharge instructions explained and patient verbalized understanding.

## 2018-10-10 NOTE — Progress Notes (Signed)
Physical Therapy Treatment Patient Details Name: Kenneth Pruitt MRN: [id number] DOB: 07-Nov-1950 Today's Date: 10/10/2018    History of Present Illness Pt is a 68 year old male s/p R TKA.  PMH includes CAD, HLD, depression and arthritis.    PT Comments    Pt modified independent with bed mobility and transfers; SBA with ambulation around nursing loop using RW; and CGA navigating stairs with B UE support on railing.  Overall pt steady and safe with functional mobility using walker.  R knee flexion ROM to 90 degrees.  Pt appearing with good understanding of HEP.  Plan for pt to discharge home today with OP PT.  Pt educated on importance of R knee ROM stretching and also of safe car transfer technique: pt verbalizing appropriate understanding.   Follow Up Recommendations  Outpatient PT     Equipment Recommendations  Rolling walker with 5" wheels    Recommendations for Other Services       Precautions / Restrictions Precautions Precautions: Knee;Fall Precaution Booklet Issued: Yes (comment) Restrictions Weight Bearing Restrictions: Yes RLE Weight Bearing: Weight bearing as tolerated    Mobility  Bed Mobility Overal bed mobility: Modified Independent             General bed mobility comments: Semi-supine to/from sit with mild increased effort to move R LE on own  Transfers Overall transfer level: Modified independent Equipment used: Rolling walker (2 wheeled) Transfers: Sit to/from [facility] Sit to Stand: Modified independent (Device/Increase time) Stand pivot transfers: Modified independent (Device/Increase time)       General transfer comment: steady safe transfers noted with walker use  Ambulation/Gait Ambulation/Gait assistance: Supervision Gait Distance (Feet): (100 feet; 180 feet) Assistive device: Rolling walker (2 wheeled)   Gait velocity: mildly decreased   General Gait Details: pt initially with "stiff" R LE advancement (R knee  straight) requiring initial cueing to relax R knee (increase R knee flexion) during R LE swing phase; steady with RW use; mostly step through gait pattern with mild decreased stance time R LE   Stairs Stairs: Yes Stairs assistance: Min guard Stair Management: One rail Right;One rail Left;Step to pattern;Forwards Number of Stairs: 8 General stair comments: pt ascended/descended 4 steps with R railing (B UE's on railing) and then ascended/descended 4 steps with L railing (B UE's on railing) safely; initial vc's for technique and then no further cueing required   Wheelchair Mobility    Modified Rankin (Stroke Patients Only)       Balance Overall balance assessment: Needs assistance Sitting-balance support: No upper extremity supported;Feet supported Sitting balance-Leahy Scale: Normal Sitting balance - Comments: steady sitting reaching outside BOS   Standing balance support: No upper extremity supported Standing balance-Leahy Scale: Good Standing balance comment: steady standing reaching within BOS                            Cognition Arousal/Alertness: Awake/alert Behavior During Therapy: WFL for tasks assessed/performed Overall Cognitive Status: Within Functional Limits for tasks assessed                                        Exercises Total Joint Exercises Quad Sets: AROM;Strengthening;Both;10 reps;Supine Short Arc Quad: AAROM;Strengthening;Right;10 reps;Supine Heel Slides: AAROM;Strengthening;Right;10 reps;Supine Hip ABduction/ADduction: AROM;Strengthening;Right;10 reps;Supine Straight Leg Raises: AROM;Strengthening;Right;10 reps;Supine Goniometric ROM: R knee extension 3 degrees short of neutral semi-supine in bed;  R knee flexion 90 degrees sitting edge of bed    General Comments General comments (skin integrity, edema, etc.): R knee dressing in place.  Pt agreeable to PT session.  Pt's wife present during session.      Pertinent  Vitals/Pain Pain Assessment: 0-10 Pain Score: 2  Pain Location: R knee Pain Descriptors / Indicators: Sore Pain Intervention(s): Limited activity within patient's tolerance;Monitored during session;Premedicated before session;Repositioned;Other (comment)(polar care applied and activated)    Home Living                      Prior Function            PT Goals (current goals can now be found in the care plan section) Acute Rehab PT Goals Patient Stated Goal: To return to cycling as soon as possible. PT Goal Formulation: With patient Time For Goal Achievement: 10/22/18 Potential to Achieve Goals: Good Progress towards PT goals: Progressing toward goals    Frequency    BID      PT Plan Current plan remains appropriate    Co-evaluation              AM-PAC PT "6 Clicks" Mobility   Outcome Measure  Help needed turning from your back to your side while in a flat bed without using bedrails?: None Help needed moving from lying on your back to sitting on the side of a flat bed without using bedrails?: None Help needed moving to and from a bed to a chair (including a wheelchair)?: None Help needed standing up from a chair using your arms (e.g., wheelchair or bedside chair)?: None Help needed to walk in hospital room?: A Little Help needed climbing 3-5 steps with a railing? : A Little 6 Click Score: 22    End of Session Equipment Utilized During Treatment: Gait belt Activity Tolerance: Patient tolerated treatment well Patient left: in bed;with call bell/phone within reach;with bed alarm set;with family/visitor present;Other (comment)(R heel elevated via towel roll) Nurse Communication: Mobility status;Precautions;Weight bearing status PT Visit Diagnosis: Muscle weakness (generalized) (M62.81);Pain;Other abnormalities of gait and mobility (R26.89) Pain - Right/Left: Right Pain - part of body: Knee     Time: 4235-3614 PT Time Calculation (min) (ACUTE ONLY): 30  min  Charges:  $Gait Training: 8-22 mins $Therapeutic Exercise: 8-22 mins                    Leitha Bleak, PT 10/10/18, 11:30 AM [phone]

## 2018-10-10 NOTE — Progress Notes (Signed)
Clinical Social Worker (CSW) received SNF consult. PT is recommending outpatient PT. RN case manager aware of above. Please reconsult if future social work needs arise. CSW signing off.   Bucky Grigg, LCSW [phone]  

## 2018-10-10 NOTE — Discharge Summary (Signed)
Physician Discharge Summary  Patient ID: Kenneth Pruitt MRN: [id number] DOB/AGE: 09/19/1950 68 y.o.  Admit date: 10/08/2018 Discharge date: 10/10/2018  Admission Diagnoses:  OSTEOARTHRITIS OF RIGHT KNEE <principal problem not specified>  Discharge Diagnoses:  OSTEOARTHRITIS OF RIGHT KNEE Active Problems:   S/P TKR (total knee replacement) using cement, right   Past Medical History:  Diagnosis Date  . Arthritis   . Coronary artery disease   . Depression   . Hyperlipidemia     Surgeries: Procedure(s): TOTAL KNEE ARTHROPLASTY on 10/08/2018   Consultants (if any):   Discharged Condition: Improved  Hospital Course: BURLON CENTRELLA is an 68 y.o. male who was admitted 10/08/2018 with a diagnosis of  OSTEOARTHRITIS OF RIGHT KNEE <principal problem not specified> and went to the operating room on 10/08/2018 and underwent the above named procedures.    He was given perioperative antibiotics:  Anti-infectives (From admission, onward)   Start     Dose/Rate Route Frequency Ordered Stop   10/08/18 1400  ceFAZolin (ANCEF) IVPB 2g/100 mL premix     2 g 200 mL/hr over 30 Minutes Intravenous Every 6 hours 10/08/18 1136 10/08/18 2037   10/08/18 0836  50,000 units bacitracin in 0.9% normal saline 250 mL irrigation  Status:  Discontinued       As needed 10/08/18 0836 10/08/18 0945   10/08/18 0604  ceFAZolin (ANCEF) 2-4 GM/100ML-% IVPB    Note to Pharmacy:  Dewayne Hatch   : cabinet override      10/08/18 0604 10/08/18 0748   10/08/18 0600  ceFAZolin (ANCEF) IVPB 2g/100 mL premix     2 g 200 mL/hr over 30 Minutes Intravenous On call to O.R. 10/07/18 2149 10/08/18 0753    .  He was given sequential compression devices, early ambulation, and Plavix and ASA for DVT prophylaxis.  He benefited maximally from the hospital stay and there were no complications.    Recent vital signs:  Vitals:   10/10/18 0424 10/10/18 0816  BP:  136/60  Pulse:  82  Resp:  18  Temp:  99 F (37.2 C)   SpO2: 97% 95%    Recent laboratory studies:  Lab Results  Component Value Date   HGB 13.0 10/10/2018   HGB 14.0 10/09/2018   HGB 17.7 (H) 10/08/2018   Lab Results  Component Value Date   WBC 10.3 10/10/2018   PLT 186 10/10/2018   Lab Results  Component Value Date   INR 0.97 09/24/2018   Lab Results  Component Value Date   NA 137 10/09/2018   K 3.7 10/09/2018   CL 102 10/09/2018   CO2 27 10/09/2018   BUN 36 (H) 10/09/2018   CREATININE 1.47 (H) 10/09/2018   GLUCOSE 123 (H) 10/09/2018    Discharge Medications:   Allergies as of 10/10/2018      Reactions   Clonidine Derivatives Shortness Of Breath, Other (See Comments)   Low heart rate      Medication List    STOP taking these medications   Fish Oil 1200 MG Caps   meloxicam 15 MG tablet Commonly known as:  MOBIC     TAKE these medications   amLODipine 5 MG tablet Commonly known as:  NORVASC Take 5 mg by mouth daily.   aspirin 81 MG EC tablet Take 1 tablet (81 mg total) by mouth daily. What changed:  when to take this   buPROPion 100 MG tablet Commonly known as:  WELLBUTRIN Take 1 tablet (100 mg total) by mouth daily.  clopidogrel 75 MG tablet Commonly known as:  PLAVIX Take 75 mg by mouth daily.   [employer] VIT D 5000 HIGH-POTENCY 125 MCG (5000 UT) capsule Generic drug:  Cholecalciferol Take 5,000 Units by mouth daily.   docusate sodium 100 MG capsule Commonly known as:  COLACE Take 1 capsule (100 mg total) by mouth 2 (two) times daily.   dutasteride 0.5 MG capsule Commonly known as:  AVODART Take 1 capsule (0.5 mg total) by mouth every evening. What changed:  when to take this   escitalopram 10 MG tablet Commonly known as:  LEXAPRO Take 1.5 tablets (15 mg total) by mouth at bedtime.   fluticasone 50 MCG/ACT nasal spray Commonly known as:  FLONASE USE 2 SPRAYS IN EACH NOSTRIL DAILY What changed:    how much to take  when to take this  reasons to take this   fluticasone 50 MCG/ACT  nasal spray Commonly known as:  FLONASE Place 2 sprays into both nostrils daily. What changed:  Another medication with the same name was changed. Make sure you understand how and when to take each.   hydrALAZINE 100 MG tablet Commonly known as:  APRESOLINE Take 50-100 mg by mouth See admin instructions. Take 50 mg by mouth in the morning and take 100 mg by mouth at bedtime   hydrochlorothiazide 25 MG tablet Commonly known as:  HYDRODIURIL Take 1 tablet (25 mg total) by mouth daily.   HYDROcodone-acetaminophen 7.5-325 MG tablet Commonly known as:  NORCO Take 1 tablet by mouth every 4 (four) hours as needed for severe pain (pain score 7-10).   hydrocortisone 2.5 % rectal cream Commonly known as:  ANUSOL-HC APPLY RECTALLY TWICE DAILY AS DIRECTED What changed:  See the new instructions.   hydrOXYzine 25 MG capsule Commonly known as:  VISTARIL Take 1 capsule (25 mg total) by mouth 2 (two) times daily as needed for anxiety (only for severe anxiety sx and also for sleep).   losartan 100 MG tablet Commonly known as:  COZAAR Take 1 tablet (100 mg total) by mouth every evening. What changed:  when to take this   niacin 1000 MG CR tablet Commonly known as:  NIASPAN Take 2 tablets (2,000 mg total) by mouth every evening. What changed:  when to take this   pantoprazole 20 MG tablet Commonly known as:  PROTONIX Take 20 mg by mouth daily.   potassium chloride SA 20 MEQ tablet Commonly known as:  K-DUR,KLOR-CON Take 1 tablet (20 mEq total) by mouth daily.   rosuvastatin 20 MG tablet Commonly known as:  CRESTOR Take 1 tablet (20 mg total) by mouth every evening. What changed:  when to take this   tamsulosin 0.4 MG Caps capsule Commonly known as:  FLOMAX Take 1 capsule (0.4 mg total) by mouth daily.   testosterone cypionate 200 MG/ML injection Commonly known as:  DEPOTESTOSTERONE CYPIONATE INJECT 0.75 ML INTRAMUSCULARLY EVERY 10 DAYS What changed:  See the new instructions.    vitamin B-12 1000 MCG tablet Commonly known as:  CYANOCOBALAMIN Take 1,000 mcg by mouth daily.       Diagnostic Studies: Dg Knee Right Port  Result Date: 10/08/2018 CLINICAL DATA:  Status post right knee replacement today. EXAM: PORTABLE RIGHT KNEE - 1-2 VIEW COMPARISON:  MRI right knee 01/22/2008. FINDINGS: The patient has a new right total knee arthroplasty. The device is located. No fracture or other acute bony abnormality. Gas in the soft tissues and surgical staples noted. IMPRESSION: Status post right knee replacement.  No  acute abnormality. Electronically Signed   By: Inge Rise M.D.   On: 10/08/2018 10:06    Disposition: Discharge disposition: 01-Home or Self Care            Signed: Lovell [name] ,MD 10/10/2018, 9:48 AM

## 2018-10-25 ENCOUNTER — Other Ambulatory Visit: Payer: Self-pay | Admitting: Family Medicine

## 2018-10-25 DIAGNOSIS — I10 Essential (primary) hypertension: Secondary | ICD-10-CM

## 2018-10-30 ENCOUNTER — Other Ambulatory Visit: Payer: Self-pay | Admitting: Family Medicine

## 2018-10-30 DIAGNOSIS — I10 Essential (primary) hypertension: Secondary | ICD-10-CM

## 2018-10-31 ENCOUNTER — Other Ambulatory Visit: Payer: Self-pay | Admitting: Family Medicine

## 2018-10-31 DIAGNOSIS — I10 Essential (primary) hypertension: Secondary | ICD-10-CM

## 2018-11-03 ENCOUNTER — Other Ambulatory Visit: Payer: Self-pay | Admitting: Family Medicine

## 2018-11-03 DIAGNOSIS — I10 Essential (primary) hypertension: Secondary | ICD-10-CM

## 2018-11-05 ENCOUNTER — Other Ambulatory Visit: Payer: Self-pay | Admitting: Family Medicine

## 2018-11-05 DIAGNOSIS — I10 Essential (primary) hypertension: Secondary | ICD-10-CM

## 2018-11-06 ENCOUNTER — Other Ambulatory Visit: Payer: Self-pay | Admitting: Family Medicine

## 2018-11-06 MED ORDER — AMLODIPINE BESYLATE 5 MG PO TABS: 5.0000 mg | ORAL_TABLET | Freq: Every day | ORAL | 0 refills | Status: DC

## 2018-11-06 NOTE — Telephone Encounter (Signed)
Requested medication (s) are due for refill today: not specified  Requested medication (s) are on the active medication list: yes    Last refill: 09/15/2018  Future visit scheduled yes 11/26/2018  Notes to clinic:historical provider  Requested Prescriptions  Pending Prescriptions Disp Refills   amLODipine (NORVASC) 5 MG tablet 90 tablet 0    Sig: Take 1 tablet (5 mg total) by mouth daily.     Cardiovascular:  Calcium Channel Blockers Passed - 11/06/2018 11:17 AM      Passed - Last BP in normal range    BP Readings from Last 1 Encounters:  10/10/18 136/60         Passed - Valid encounter within last 6 months    Recent Outpatient Visits          1 month ago Hypokalemia   [facility] Crissman, Jeannette How, MD   5 months ago Essential hypertension   [place] Crissman, Jeannette How, MD   7 months ago Essential hypertension   [place], Jeannette How, MD   8 months ago Essential hypertension   [place] Crissman, Jeannette How, MD   11 months ago Essential hypertension   [place], Jeannette How, MD      Future Appointments            In 2 weeks Crissman, Jeannette How, MD [facility]

## 2018-11-06 NOTE — Telephone Encounter (Signed)
Copied from [place] [id number]. Topic: Quick Communication - Rx Refill/Question >> Nov 06, 2018 11:08 AM Windy Kalata wrote: Medication: amLODipine (NORVASC) 10 MG tablet   Has the patient contacted their pharmacy? Yes.   (Agent: If no, request that the patient contact the pharmacy for the refill.) (Agent: If yes, when and what did the pharmacy advise?) Call office  Preferred Pharmacy (with phone number or street name): [place], [place] - [place] [phone] (Phone) [phone] (Fax)    Agent: Please be advised that RX refills may take up to 3 business days. We ask that you follow-up with your pharmacy.

## 2018-11-13 ENCOUNTER — Other Ambulatory Visit: Payer: Self-pay | Admitting: Family Medicine

## 2018-11-13 DIAGNOSIS — R972 Elevated prostate specific antigen [PSA]: Secondary | ICD-10-CM

## 2018-11-13 DIAGNOSIS — R350 Frequency of micturition: Secondary | ICD-10-CM

## 2018-11-13 DIAGNOSIS — N401 Enlarged prostate with lower urinary tract symptoms: Secondary | ICD-10-CM

## 2018-11-13 DIAGNOSIS — E782 Mixed hyperlipidemia: Secondary | ICD-10-CM

## 2018-11-13 NOTE — Telephone Encounter (Signed)
Requested medication (s) are due for refill today:  not specified  Requested medication (s) are on the active medication list:   yes  Last refill: 03/28/18  Future visit scheduled   yes    11/26/2018     Dr. Jeananne Rama  Notes to clinic:  historical provider  Requested Prescriptions  Pending Prescriptions Disp Refills   niacin (NIASPAN) 1000 MG CR tablet [Pharmacy Med Name: NIACIN ER (ANTIHYPERLIPIDEMIC) 1000] 180 tablet     Sig: TAKE TWO TABLETS BY MOUTH AT BEDTIME     Cardiovascular:  Antilipid - niacin Failed - 11/13/2018  9:51 AM      Failed - AST in normal range and within 360 days    AST  Date Value Ref Range Status  10/11/2017 22 0 - 40 IU/L Final   AST (SGOT) Piccolo, Waived  Date Value Ref Range Status  08/06/2016 28 11 - 38 U/L Final         Failed - ALT in normal range and within 360 days    ALT  Date Value Ref Range Status  10/11/2017 23 0 - 44 IU/L Final   ALT (SGPT) Piccolo, Waived  Date Value Ref Range Status  08/06/2016 23 10 - 47 U/L Final         Failed - HDL in normal range and within 360 days    HDL  Date Value Ref Range Status  03/28/2018 36 (L) >40 mg/dL Final  10/11/2017 33 (L) >39 mg/dL Final         Failed - Triglycerides in normal range and within 360 days    Triglycerides  Date Value Ref Range Status  03/28/2018 186 (H) <150 mg/dL Final   Triglycerides Piccolo,Waived  Date Value Ref Range Status  08/06/2016 188 (H) <150 mg/dL Final    Comment:                            Normal                   <150                         Borderline High     150 - 199                         High                200 - 499                         Very High                >499          Passed - Total Cholesterol in normal range and within 360 days    Cholesterol, Total  Date Value Ref Range Status  10/11/2017 100 100 - 199 mg/dL Final   Cholesterol  Date Value Ref Range Status  03/28/2018 118 0 - 200 mg/dL Final   Cholesterol Piccolo, Waived   Date Value Ref Range Status  08/06/2016 115 <200 mg/dL Final    Comment:                            Desirable                <200  Borderline High      200- 239                         High                     >239          Passed - LDL in normal range and within 360 days    LDL Calculated  Date Value Ref Range Status  10/11/2017 41 0 - 99 mg/dL Final   LDL Cholesterol  Date Value Ref Range Status  03/28/2018 45 0 - 99 mg/dL Final    Comment:           Total Cholesterol/HDL:CHD Risk Coronary Heart Disease Risk Table                     Men   Women  1/2 Average Risk   3.4   3.3  Average Risk       5.0   4.4  2 X Average Risk   9.6   7.1  3 X Average Risk  23.4   11.0        Use the calculated Patient Ratio above and the CHD Risk Table to determine the patient's CHD Risk.        ATP III CLASSIFICATION (LDL):  <100     mg/dL   Optimal  100-129  mg/dL   Near or Above                    Optimal  130-159  mg/dL   Borderline  160-189  mg/dL   High  >190     mg/dL   Very High Performed at [facility], [address], [place], [place] [address]          Passed - Valid encounter within last 12 months    Recent Outpatient Visits          1 month ago Hypokalemia   [facility] Crissman, Jeannette How, MD   5 months ago Essential hypertension   [place] Crissman, Jeannette How, MD   7 months ago Essential hypertension   [place] Crissman, Jeannette How, MD   8 months ago Essential hypertension   [place] Crissman, Jeannette How, MD   11 months ago Essential hypertension   [place], Jeannette How, MD      Future Appointments            In 1 week Crissman, Jeannette How, MD [place]         Signed Prescriptions Disp Refills   tamsulosin (FLOMAX) 0.4 MG CAPS capsule 90 capsule 2    Sig: TAKE 1 CAPSULE BY MOUTH EVERY DAY     Urology: Alpha-Adrenergic Blocker Passed -  11/13/2018  9:51 AM      Passed - Last BP in normal range    BP Readings from Last 1 Encounters:  10/10/18 136/60         Passed - Valid encounter within last 12 months    Recent Outpatient Visits          1 month ago Hypokalemia   [facility] Crissman, Jeannette How, MD   5 months ago Essential hypertension   [place] Crissman, Jeannette How, MD   7 months ago Essential hypertension   [facility] Crissman, Jeannette How, MD  8 months ago Essential hypertension   [place], Jeannette How, MD   11 months ago Essential hypertension   [place] Crissman, Jeannette How, MD      Future Appointments            In 1 week Crissman, Jeannette How, MD [facility], [facility]

## 2018-11-26 ENCOUNTER — Encounter: Payer: Self-pay | Admitting: Family Medicine

## 2018-11-26 ENCOUNTER — Ambulatory Visit (INDEPENDENT_AMBULATORY_CARE_PROVIDER_SITE_OTHER): Payer: Medicare Other | Admitting: Family Medicine

## 2018-11-26 ENCOUNTER — Other Ambulatory Visit: Payer: Self-pay

## 2018-11-26 VITALS — BP 136/78 | HR 89 | Temp 98.3°F | Ht 73.8 in | Wt 231.6 lb

## 2018-11-26 DIAGNOSIS — I10 Essential (primary) hypertension: Secondary | ICD-10-CM

## 2018-11-26 DIAGNOSIS — R350 Frequency of micturition: Secondary | ICD-10-CM

## 2018-11-26 DIAGNOSIS — Z0000 Encounter for general adult medical examination without abnormal findings: Secondary | ICD-10-CM

## 2018-11-26 DIAGNOSIS — N401 Enlarged prostate with lower urinary tract symptoms: Secondary | ICD-10-CM

## 2018-11-26 DIAGNOSIS — I2510 Atherosclerotic heart disease of native coronary artery without angina pectoris: Secondary | ICD-10-CM

## 2018-11-26 DIAGNOSIS — Z7189 Other specified counseling: Secondary | ICD-10-CM

## 2018-11-26 DIAGNOSIS — Z23 Encounter for immunization: Secondary | ICD-10-CM

## 2018-11-26 DIAGNOSIS — E291 Testicular hypofunction: Secondary | ICD-10-CM

## 2018-11-26 DIAGNOSIS — E782 Mixed hyperlipidemia: Secondary | ICD-10-CM | POA: Diagnosis not present

## 2018-11-26 DIAGNOSIS — Z96651 Presence of right artificial knee joint: Secondary | ICD-10-CM

## 2018-11-26 DIAGNOSIS — F331 Major depressive disorder, recurrent, moderate: Secondary | ICD-10-CM

## 2018-11-26 LAB — URINALYSIS, ROUTINE W REFLEX MICROSCOPIC
Bilirubin, UA: NEGATIVE
Glucose, UA: NEGATIVE
Ketones, UA: NEGATIVE
Leukocytes, UA: NEGATIVE
Nitrite, UA: NEGATIVE
Protein, UA: NEGATIVE
RBC, UA: NEGATIVE
Specific Gravity, UA: 1.015 (ref 1.005–1.030)
Urobilinogen, Ur: 1 mg/dL (ref 0.2–1.0)
pH, UA: 7 (ref 5.0–7.5)

## 2018-11-26 MED ORDER — AMLODIPINE BESYLATE 5 MG PO TABS: 5.0000 mg | ORAL_TABLET | Freq: Every day | ORAL | 4 refills | Status: DC

## 2018-11-26 MED ORDER — HYDROCHLOROTHIAZIDE 25 MG PO TABS: 25.0000 mg | ORAL_TABLET | Freq: Every day | ORAL | 4 refills | Status: DC

## 2018-11-26 MED ORDER — ROSUVASTATIN CALCIUM 20 MG PO TABS: 20.0000 mg | ORAL_TABLET | Freq: Every evening | ORAL | 4 refills | Status: DC

## 2018-11-26 MED ORDER — FLUTICASONE PROPIONATE 50 MCG/ACT NA SUSP: 2.0000 | Freq: Every day | NASAL | 6 refills | Status: DC

## 2018-11-26 MED ORDER — DUTASTERIDE 0.5 MG PO CAPS: 0.5000 mg | ORAL_CAPSULE | Freq: Every evening | ORAL | 4 refills | Status: DC

## 2018-11-26 MED ORDER — LOSARTAN POTASSIUM 100 MG PO TABS: 100.0000 mg | ORAL_TABLET | Freq: Every day | ORAL | 4 refills | Status: DC

## 2018-11-26 MED ORDER — NIACIN ER (ANTIHYPERLIPIDEMIC) 1000 MG PO TBCR: 2000.0000 mg | EXTENDED_RELEASE_TABLET | Freq: Every day | ORAL | 4 refills | Status: DC

## 2018-11-26 NOTE — Assessment & Plan Note (Signed)
The current medical regimen is effective;  continue present plan and medications.  

## 2018-11-26 NOTE — Progress Notes (Signed)
BP 136/78 (BP Location: Left Arm, Cuff Size: Normal)   Pulse 89   Temp 98.3 F (36.8 C) (Oral)   Ht 6' 1.8" (1.875 m)   Wt 231 lb 9.6 oz (105.1 kg)   SpO2 97%   BMI 29.90 kg/m    Subjective:    Patient ID: Kenneth Pruitt, male    DOB: [birth year], 68 y.o.   MRN: [id number]  HPI: Kenneth Pruitt is a 68 y.o. male  Chief Complaint  Patient presents with  . Annual Exam  Patient follow-up has been doing well good notes from cardiology. Has had knee replacement surgery and is doing well with recovery and physical therapy. Depression has been stable followed by psychiatry. Pressures do not especially good especially with weight loss. BPH symptoms stable. Cholesterol also staying stable. Depo testosterone and hypogonadal being treated without problems.  Relevant past medical, surgical, family and social history reviewed and updated as indicated. Interim medical history since our last visit reviewed. Allergies and medications reviewed and updated.  Review of Systems  Constitutional: Negative.   HENT: Negative.   Eyes: Negative.   Respiratory: Negative.   Cardiovascular: Negative.   Gastrointestinal: Negative.   Endocrine: Negative.   Genitourinary: Negative.   Musculoskeletal: Negative.   Skin: Negative.   Allergic/Immunologic: Negative.   Neurological: Negative.   Hematological: Negative.   Psychiatric/Behavioral: Negative.     Per HPI unless specifically indicated above     Objective:    BP 136/78 (BP Location: Left Arm, Cuff Size: Normal)   Pulse 89   Temp 98.3 F (36.8 C) (Oral)   Ht 6' 1.8" (1.875 m)   Wt 231 lb 9.6 oz (105.1 kg)   SpO2 97%   BMI 29.90 kg/m   Wt Readings from Last 3 Encounters:  11/26/18 231 lb 9.6 oz (105.1 kg)  10/08/18 235 lb (106.6 kg)  09/30/18 236 lb (107 kg)    Physical Exam Constitutional:      Appearance: He is well-developed.  HENT:     Head: Normocephalic and atraumatic.     Right Ear: External ear normal.     Left  Ear: External ear normal.  Eyes:     Conjunctiva/sclera: Conjunctivae normal.     Pupils: Pupils are equal, round, and reactive to light.  Neck:     Musculoskeletal: Normal range of motion and neck supple.  Cardiovascular:     Rate and Rhythm: Normal rate and regular rhythm.     Heart sounds: Normal heart sounds.  Pulmonary:     Effort: Pulmonary effort is normal.     Breath sounds: Normal breath sounds.  Abdominal:     General: Bowel sounds are normal.     Palpations: Abdomen is soft. There is no hepatomegaly or splenomegaly.  Genitourinary:    Penis: Normal.      Rectum: Normal.     Comments: BPH changes Musculoskeletal: Normal range of motion.  Skin:    Findings: No erythema or rash.  Neurological:     Mental Status: He is alert and oriented to person, place, and time.     Deep Tendon Reflexes: Reflexes are normal and symmetric.  Psychiatric:        Behavior: Behavior normal.        Thought Content: Thought content normal.        Judgment: Judgment normal.     Results for orders placed or performed during the hospital encounter of 24/58/09  Basic metabolic panel  Result Value Ref Range  Sodium 137 135 - 145 mmol/L   Potassium 3.7 3.5 - 5.1 mmol/L   Chloride 102 98 - 111 mmol/L   CO2 27 22 - 32 mmol/L   Glucose, Bld 123 (H) 70 - 99 mg/dL   BUN 36 (H) 8 - 23 mg/dL   Creatinine, Ser 1.47 (H) 0.61 - 1.24 mg/dL   Calcium 8.4 (L) 8.9 - 10.3 mg/dL   GFR calc non Af Amer 49 (L) >60 mL/min   GFR calc Af Amer 56 (L) >60 mL/min   Anion gap 8 5 - 15  CBC  Result Value Ref Range   WBC 14.9 (H) 4.0 - 10.5 K/uL   RBC 4.67 4.22 - 5.81 MIL/uL   Hemoglobin 14.0 13.0 - 17.0 g/dL   HCT 42.3 39.0 - 52.0 %   MCV 90.6 80.0 - 100.0 fL   MCH 30.0 26.0 - 34.0 pg   MCHC 33.1 30.0 - 36.0 g/dL   RDW 14.7 11.5 - 15.5 %   Platelets 193 150 - 400 K/uL   nRBC 0.0 0.0 - 0.2 %  CBC  Result Value Ref Range   WBC 10.3 4.0 - 10.5 K/uL   RBC 4.32 4.22 - 5.81 MIL/uL   Hemoglobin 13.0 13.0 -  17.0 g/dL   HCT 40.0 39.0 - 52.0 %   MCV 92.6 80.0 - 100.0 fL   MCH 30.1 26.0 - 34.0 pg   MCHC 32.5 30.0 - 36.0 g/dL   RDW 14.7 11.5 - 15.5 %   Platelets 186 150 - 400 K/uL   nRBC 0.0 0.0 - 0.2 %  I-STAT 4, (NA,K, GLUC, HGB,HCT)  Result Value Ref Range   Sodium 139 135 - 145 mmol/L   Potassium 3.8 3.5 - 5.1 mmol/L   Glucose, Bld 114 (H) 70 - 99 mg/dL   HCT 52.0 39.0 - 52.0 %   Hemoglobin 17.7 (H) 13.0 - 17.0 g/dL  ABO/Rh  Result Value Ref Range   ABO/RH(D)      A POS Performed at [facility], [address] [place], [place] [address]       Assessment & Plan:   Problem List Items Addressed This Visit      Cardiovascular and Mediastinum   Essential hypertension    The current medical regimen is effective;  continue present plan and medications.       Relevant Medications   VASCEPA 1 g CAPS   hydrochlorothiazide (HYDRODIURIL) 25 MG tablet   rosuvastatin (CRESTOR) 20 MG tablet   losartan (COZAAR) 100 MG tablet   amLODipine (NORVASC) 5 MG tablet   niacin (NIASPAN) 1000 MG CR tablet   Other Relevant Orders   CBC with Differential/Platelet   Comprehensive metabolic panel   TSH   Urinalysis, Routine w reflex microscopic   CAD (coronary artery disease)    The current medical regimen is effective;  continue present plan and medications.       Relevant Medications   VASCEPA 1 g CAPS   hydrochlorothiazide (HYDRODIURIL) 25 MG tablet   rosuvastatin (CRESTOR) 20 MG tablet   losartan (COZAAR) 100 MG tablet   amLODipine (NORVASC) 5 MG tablet   niacin (NIASPAN) 1000 MG CR tablet   Other Relevant Orders   Comprehensive metabolic panel   TSH   Lipid panel   Urinalysis, Routine w reflex microscopic     Endocrine   Hypogonadism in male    The current medical regimen is effective;  continue present plan and medications.  Relevant Orders   Comprehensive metabolic panel   TSH   PSA   Testosterone     Genitourinary   BPH (benign prostatic  hyperplasia)    The current medical regimen is effective;  continue present plan and medications.       Relevant Medications   dutasteride (AVODART) 0.5 MG capsule     Other   Depression    The current medical regimen is effective;  continue present plan and medications.       Hyperlipidemia   Relevant Medications   VASCEPA 1 g CAPS   hydrochlorothiazide (HYDRODIURIL) 25 MG tablet   rosuvastatin (CRESTOR) 20 MG tablet   losartan (COZAAR) 100 MG tablet   amLODipine (NORVASC) 5 MG tablet   niacin (NIASPAN) 1000 MG CR tablet   Advanced care planning/counseling discussion    A voluntary discussion about advanced care planning including explanation and discussion of advanced directives was extentively discussed with the patient.  Explained about the healthcare proxy and living will was reviewed and packet with forms with expiration of how to fill them out was given.  Time spent: Encounter 16+ min individuals present: Patient      S/P TKR (total knee replacement) using cement, right    The current medical regimen is effective;  continue present plan and medications.        Other Visit Diagnoses    Need for pneumococcal vaccination    -  Primary   Relevant Orders   Pneumococcal polysaccharide vaccine 23-valent greater than or equal to 2yo subcutaneous/IM   PE (physical exam), annual           Follow up plan: Return for BMP,  Lipids, ALT, AST, testosterone, CBC, PSA.

## 2018-11-26 NOTE — Patient Instructions (Signed)

## 2018-11-26 NOTE — Assessment & Plan Note (Signed)
A voluntary discussion about advanced care planning including explanation and discussion of advanced directives was extentively discussed with the patient.  Explained about the healthcare proxy and living will was reviewed and packet with forms with expiration of how to fill them out was given.  Time spent: Encounter 16+ min individuals present: Patient 

## 2018-11-27 ENCOUNTER — Encounter: Payer: Self-pay | Admitting: Family Medicine

## 2018-11-27 LAB — CBC WITH DIFFERENTIAL/PLATELET
Basophils Absolute: 0.1 10*3/uL (ref 0.0–0.2)
Basos: 1 %
EOS (ABSOLUTE): 0.1 10*3/uL (ref 0.0–0.4)
Eos: 2 %
Hematocrit: 50.8 % (ref 37.5–51.0)
Hemoglobin: 16.7 g/dL (ref 13.0–17.7)
Immature Grans (Abs): 0 10*3/uL (ref 0.0–0.1)
Immature Granulocytes: 0 %
Lymphocytes Absolute: 1.6 10*3/uL (ref 0.7–3.1)
Lymphs: 19 %
MCH: 29 pg (ref 26.6–33.0)
MCHC: 32.9 g/dL (ref 31.5–35.7)
MCV: 88 fL (ref 79–97)
Monocytes Absolute: 0.9 10*3/uL (ref 0.1–0.9)
Monocytes: 11 %
NEUTROS PCT: 67 %
Neutrophils Absolute: 5.3 10*3/uL (ref 1.4–7.0)
PLATELETS: 316 10*3/uL (ref 150–450)
RBC: 5.75 x10E6/uL (ref 4.14–5.80)
RDW: 13.4 % (ref 11.6–15.4)
WBC: 8 10*3/uL (ref 3.4–10.8)

## 2018-11-27 LAB — COMPREHENSIVE METABOLIC PANEL
ALT: 15 IU/L (ref 0–44)
AST: 22 IU/L (ref 0–40)
Albumin/Globulin Ratio: 1.5 (ref 1.2–2.2)
Albumin: 4.2 g/dL (ref 3.8–4.8)
Alkaline Phosphatase: 63 IU/L (ref 39–117)
BUN/Creatinine Ratio: 15 (ref 10–24)
BUN: 18 mg/dL (ref 8–27)
Bilirubin Total: 0.3 mg/dL (ref 0.0–1.2)
CO2: 28 mmol/L (ref 20–29)
Calcium: 9.5 mg/dL (ref 8.6–10.2)
Chloride: 99 mmol/L (ref 96–106)
Creatinine, Ser: 1.22 mg/dL (ref 0.76–1.27)
GFR calc Af Amer: 70 mL/min/{1.73_m2} (ref >59)
GFR calc non Af Amer: 61 mL/min/{1.73_m2} (ref >59)
Globulin, Total: 2.8 g/dL (ref 1.5–4.5)
Glucose: 89 mg/dL (ref 65–99)
POTASSIUM: 3.9 mmol/L (ref 3.5–5.2)
Sodium: 140 mmol/L (ref 134–144)
Total Protein: 7 g/dL (ref 6.0–8.5)

## 2018-11-27 LAB — LIPID PANEL
CHOLESTEROL TOTAL: 100 mg/dL (ref 100–199)
Chol/HDL Ratio: 3.4 ratio (ref 0.0–5.0)
HDL: 29 mg/dL — ABNORMAL LOW (ref >39)
LDL Calculated: 21 mg/dL (ref 0–99)
Triglycerides: 249 mg/dL — ABNORMAL HIGH (ref 0–149)
VLDL CHOLESTEROL CAL: 50 mg/dL — AB (ref 5–40)

## 2018-11-27 LAB — PSA: Prostate Specific Ag, Serum: 2.7 ng/mL (ref 0.0–4.0)

## 2018-11-27 LAB — TSH: TSH: 1.15 u[IU]/mL (ref 0.450–4.500)

## 2018-11-27 LAB — TESTOSTERONE: Testosterone: 1084 ng/dL — ABNORMAL HIGH (ref 264–916)

## 2018-12-01 ENCOUNTER — Other Ambulatory Visit: Payer: Self-pay

## 2018-12-01 ENCOUNTER — Encounter: Payer: Self-pay | Admitting: Psychiatry

## 2018-12-01 ENCOUNTER — Ambulatory Visit: Payer: Medicare Other | Admitting: Psychiatry

## 2018-12-01 VITALS — BP 155/80 | HR 87 | Temp 98.9°F | Wt 232.6 lb

## 2018-12-01 DIAGNOSIS — F5105 Insomnia due to other mental disorder: Secondary | ICD-10-CM | POA: Diagnosis not present

## 2018-12-01 DIAGNOSIS — F330 Major depressive disorder, recurrent, mild: Secondary | ICD-10-CM

## 2018-12-01 DIAGNOSIS — F411 Generalized anxiety disorder: Secondary | ICD-10-CM | POA: Diagnosis not present

## 2018-12-01 MED ORDER — ESCITALOPRAM OXALATE 10 MG PO TABS: 15.0000 mg | ORAL_TABLET | Freq: Every day | ORAL | 1 refills | Status: DC

## 2018-12-01 NOTE — Progress Notes (Signed)
[facility] MD OP Progress Note  12/01/2018 5:49 PM Kenneth Pruitt  MRN:  [id number]  Chief Complaint: ' I am here for follow up.' Chief Complaint    Follow-up; Medication Refill     HPI: Kenneth Pruitt is a 68 year old Caucasian male, retired, married, lives in [place], has a history of depression, anxiety, coronary artery disease, recent stent placement, knee pain, testosterone deficiency, presented to clinic today for a follow-up visit.  Patient reports he is doing well with regards to his mood symptoms.  He continues to be compliant on his Wellbutrin and Lexapro.  He denies any significant sadness, crying spells, anxiety symptoms.  He reports sleep is good.  He reports he had his knee surgery done to his right sided knee a few weeks ago.  He reports he continues to be in physical therapy.  He reports he is recovering well and does not need any more pain medications anymore.  He has been able to ride his bike around the neighborhood and has been walking.  Patient denies any other concerns today. Visit Diagnosis:    ICD-10-CM   1. MDD (major depressive disorder), recurrent episode, mild (HCC) F33.0 escitalopram (LEXAPRO) 10 MG tablet  2. GAD (generalized anxiety disorder) F41.1 escitalopram (LEXAPRO) 10 MG tablet  3. Insomnia due to mental disorder F51.05     Past Psychiatric History: I have reviewed past psychiatric history from my progress note on 12/16/2017.  Past Medical History:  Past Medical History:  Diagnosis Date  . Arthritis   . Coronary artery disease   . Depression   . Hyperlipidemia     Past Surgical History:  Procedure Laterality Date  . ANKLE FRACTURE SURGERY Right   . CARDIAC CATHETERIZATION N/A 04/26/2015   Procedure: Left Heart Cath;  Surgeon: Dionisio David, MD;  Location: [facility];  Service: Cardiovascular;  Laterality: N/A;  . CORONARY ANGIOPLASTY    . CORONARY STENT INTERVENTION N/A 03/27/2018   Procedure: CORONARY STENT INTERVENTION;  Surgeon: Yolonda Kida, MD;  Location: [facility];  Service: Cardiovascular;  Laterality: N/A;  . EYE SURGERY Bilateral    cataract  . LEFT HEART CATH AND CORONARY ANGIOGRAPHY Left 03/27/2018   Procedure: Left Heart Cath with possible coronary intervention;  Surgeon: Dionisio David, MD;  Location: [facility];  Service: Cardiovascular;  Laterality: Left;  . TOTAL KNEE ARTHROPLASTY Right 10/08/2018   Procedure: TOTAL KNEE ARTHROPLASTY;  Surgeon: Lovell [name], MD;  Location: [facility];  Service: Orthopedics;  Laterality: Right;    Family Psychiatric History: I have reviewed family psychiatric history from my progress note on 12/16/2017  Family History:  Family History  Problem Relation Age of Onset  . Diabetes Mother   . Hypertension Mother   . Cancer Father   . Depression Father   . Anxiety disorder Father   . Depression Daughter     Social History: I have reviewed social history from my progress note on 12/16/2017. Social History   Socioeconomic History  . Marital status: Married    Spouse name: ann  . Number of children: 2  . Years of education: Not on file  . Highest education level: Master's degree (e.g., MA, MS, MEng, MEd, MSW, MBA)  Occupational History    Comment: retired  Scientific laboratory technician  . Financial resource strain: Not hard at all  . Food insecurity:    Worry: Never true    Inability: Never true  . Transportation needs:    Medical: No  Non-medical: No  Tobacco Use  . Smoking status: Former Smoker    Years: 30.00    Types: Cigars    Last attempt to quit: 07/18/2006    Years since quitting: 12.3  . Smokeless tobacco: Never Used  Substance and Sexual Activity  . Alcohol use: No  . Drug use: No  . Sexual activity: Not Currently  Lifestyle  . Physical activity:    Days per week: 0 days    Minutes per session: 0 min  . Stress: Not at all  Relationships  . Social connections:    Talks on phone: More than three times a week    Gets together: More than  three times a week    Attends religious service: More than 4 times per year    Active member of club or organization: Yes    Attends meetings of clubs or organizations: More than 4 times per year    Relationship status: Married  Other Topics Concern  . Not on file  Social History Narrative  . Not on file    Allergies:  Allergies  Allergen Reactions  . Clonidine Derivatives Shortness Of Breath and Other (See Comments)    Low heart rate    Metabolic Disorder Labs: No results found for: HGBA1C, MPG No results found for: PROLACTIN Lab Results  Component Value Date   CHOL 100 11/26/2018   TRIG 249 (H) 11/26/2018   HDL 29 (L) 11/26/2018   CHOLHDL 3.4 11/26/2018   VLDL 37 03/28/2018   LDLCALC 21 11/26/2018   LDLCALC 45 03/28/2018   Lab Results  Component Value Date   TSH 1.150 11/26/2018   TSH 1.030 02/05/2017    Therapeutic Level Labs: No results found for: LITHIUM No results found for: VALPROATE No components found for:  CBMZ  Current Medications: Current Outpatient Medications  Medication Sig Dispense Refill  . amLODipine (NORVASC) 5 MG tablet Take 1 tablet (5 mg total) by mouth daily. 90 tablet 4  . aspirin EC 81 MG EC tablet Take 1 tablet (81 mg total) by mouth daily. (Patient taking differently: Take 81 mg by mouth at bedtime. )    . buPROPion (WELLBUTRIN) 100 MG tablet Take 1 tablet (100 mg total) by mouth daily. 90 tablet 1  . Cholecalciferol ([employer] VIT D 5000 HIGH-POTENCY) 5000 units capsule Take 5,000 Units by mouth daily.    . clopidogrel (PLAVIX) 75 MG tablet Take 75 mg by mouth daily.    Marland Kitchen dutasteride (AVODART) 0.5 MG capsule Take 1 capsule (0.5 mg total) by mouth every evening. 90 capsule 4  . escitalopram (LEXAPRO) 10 MG tablet Take 1.5 tablets (15 mg total) by mouth at bedtime. 135 tablet 1  . fluticasone (FLONASE) 50 MCG/ACT nasal spray Place 2 sprays into both nostrils daily. 16 g 6  . hydrALAZINE (APRESOLINE) 100 MG tablet Take 50-100 mg by mouth See  admin instructions. Take 50 mg by mouth in the morning and take 100 mg by mouth at bedtime    . hydrochlorothiazide (HYDRODIURIL) 25 MG tablet Take 1 tablet (25 mg total) by mouth daily. 90 tablet 4  . hydrocortisone (ANUSOL-HC) 2.5 % rectal cream APPLY RECTALLY TWICE DAILY AS DIRECTED (Patient taking differently: Place 1 application rectally 2 (two) times daily as needed for hemorrhoids or anal itching. ) 30 g 0  . losartan (COZAAR) 100 MG tablet Take 1 tablet (100 mg total) by mouth daily. 90 tablet 4  . niacin (NIASPAN) 1000 MG CR tablet Take 2 tablets (2,000 mg  total) by mouth at bedtime. 180 tablet 4  . pantoprazole (PROTONIX) 20 MG tablet Take 20 mg by mouth daily.     . rosuvastatin (CRESTOR) 20 MG tablet Take 1 tablet (20 mg total) by mouth every evening. 90 tablet 4  . tamsulosin (FLOMAX) 0.4 MG CAPS capsule TAKE 1 CAPSULE BY MOUTH EVERY DAY 90 capsule 2  . testosterone cypionate (DEPOTESTOSTERONE CYPIONATE) 200 MG/ML injection INJECT 0.75 ML INTRAMUSCULARLY EVERY 10 DAYS (Patient taking differently: Inject 150 mg into the muscle See admin instructions. Inject 150 mg IM every 10 days) 10 mL 1  . VASCEPA 1 g CAPS Take 2 g by mouth 2 (two) times daily.    . vitamin B-12 (CYANOCOBALAMIN) 1000 MCG tablet Take 1,000 mcg by mouth daily.     No current facility-administered medications for this visit.      Musculoskeletal: Strength & Muscle Tone: within normal limits Gait & Station: normal Patient leans: N/A  Psychiatric Specialty Exam: Review of Systems  Psychiatric/Behavioral: Negative for depression. The patient is not nervous/anxious.   All other systems reviewed and are negative.   Blood pressure (!) 155/80, pulse 87, temperature 98.9 F (37.2 C), temperature source Oral, weight 232 lb 9.6 oz (105.5 kg).Body mass index is 30.03 kg/m.  General Appearance: Casual  Eye Contact:  Fair  Speech:  Clear and Coherent  Volume:  Normal  Mood:  Euthymic  Affect:  Congruent  Thought  Process:  Goal Directed and Descriptions of Associations: Intact  Orientation:  Full (Time, Place, and Person)  Thought Content: Logical   Suicidal Thoughts:  No  Homicidal Thoughts:  No  Memory:  Immediate;   Fair Recent;   Fair Remote;   Fair  Judgement:  Fair  Insight:  Fair  Psychomotor Activity:  Normal  Concentration:  Concentration: Fair and Attention Span: Fair  Recall:  [employer] of Knowledge: Fair  Language: Fair  Akathisia:  No  Handed:  Right  AIMS (if indicated): na  Assets:  Communication Skills Desire for Improvement Social Support  ADL's:  Intact  Cognition: WNL  Sleep:  Fair   Screenings: PHQ2-9     Office Visit from 11/26/2018 in [facility] from 10/11/2017 in [facility] Visit from 03/12/2017 in [facility] Visit from 02/02/2016 in [facility]  PHQ-2 Total Score  0  6  1  1   PHQ-9 Total Score  0  24  -  -       Assessment and Plan: Marquay is a 68 year old Caucasian male who has a history of depression, anxiety, multiple medical problems including essential hypertension, coronary artery disease, status post stent placement, hyperlipidemia, BPH, knee pain, testosterone deficiency, presented to clinic today for a follow-up visit.  Reports he is recovering well from his recent knee surgery.  He continues to do well on the medications.  Plan as noted below.  Plan MDD-stable Wellbutrin 100 mg p.o. daily. Lexapro 15 mg p.o. daily-reduced dosage.  For GAD-improving Lexapro 15 mg p.o. daily Hydroxyzine 25 mg p.o. twice daily PRN for severe anxiety attacks.  Follow-up in clinic in 2 to 3 months or sooner if needed.  I have spent atleast 15 minutes face to face with patient today. More than 50 % of the time was spent for psychoeducation and supportive psychotherapy and care coordination.  This note was generated in part or whole with voice recognition software. Voice recognition is  usually quite accurate but there are transcription errors  that can and very often do occur. I apologize for any typographical errors that were not detected and corrected.        Ursula Alert, MD 12/01/2018, 5:49 PM

## 2018-12-02 ENCOUNTER — Other Ambulatory Visit: Payer: Self-pay | Admitting: *Deleted

## 2018-12-02 DIAGNOSIS — D751 Secondary polycythemia: Secondary | ICD-10-CM

## 2018-12-08 ENCOUNTER — Inpatient Hospital Stay: Payer: Medicare Other

## 2018-12-08 ENCOUNTER — Inpatient Hospital Stay: Payer: Medicare Other | Admitting: Oncology

## 2018-12-08 ENCOUNTER — Other Ambulatory Visit: Payer: Self-pay | Admitting: Psychiatry

## 2018-12-08 DIAGNOSIS — F330 Major depressive disorder, recurrent, mild: Secondary | ICD-10-CM

## 2019-01-16 ENCOUNTER — Inpatient Hospital Stay: Payer: Medicare Other

## 2019-01-16 ENCOUNTER — Inpatient Hospital Stay: Payer: Medicare Other | Admitting: Oncology

## 2019-02-26 ENCOUNTER — Other Ambulatory Visit: Payer: Self-pay | Admitting: Family Medicine

## 2019-02-26 NOTE — Telephone Encounter (Signed)
Please advise on refill request

## 2019-03-03 ENCOUNTER — Ambulatory Visit (INDEPENDENT_AMBULATORY_CARE_PROVIDER_SITE_OTHER): Payer: Medicare Other | Admitting: Psychiatry

## 2019-03-03 ENCOUNTER — Encounter: Payer: Self-pay | Admitting: Psychiatry

## 2019-03-03 ENCOUNTER — Other Ambulatory Visit: Payer: Self-pay

## 2019-03-03 DIAGNOSIS — F330 Major depressive disorder, recurrent, mild: Secondary | ICD-10-CM

## 2019-03-03 DIAGNOSIS — F411 Generalized anxiety disorder: Secondary | ICD-10-CM

## 2019-03-03 MED ORDER — ESCITALOPRAM OXALATE 10 MG PO TABS: 15.0000 mg | ORAL_TABLET | Freq: Every day | ORAL | 1 refills | Status: DC

## 2019-03-03 MED ORDER — BUPROPION HCL 100 MG PO TABS: 100.0000 mg | ORAL_TABLET | Freq: Every day | ORAL | 1 refills | Status: DC

## 2019-03-03 NOTE — Progress Notes (Signed)
Virtual Visit via Video Note  I connected with Kenneth Pruitt on 03/03/19 at 10:30 AM EDT by a video enabled telemedicine application and verified that I am speaking with the correct person using two identifiers.   I discussed the limitations of evaluation and management by telemedicine and the availability of in person appointments. The patient expressed understanding and agreed to proceed.   I discussed the assessment and treatment plan with the patient. The patient was provided an opportunity to ask questions and all were answered. The patient agreed with the plan and demonstrated an understanding of the instructions.   The patient was advised to call back or seek an in-person evaluation if the symptoms worsen or if the condition fails to improve as anticipated.   [place] MD OP Progress Note  03/03/2019 5:47 PM Kenneth Pruitt  MRN:  [id number]  Chief Complaint:  Chief Complaint    Follow-up     HPI: Kenneth Pruitt is a 69 year old Caucasian male, retired, married, lives in [place], has a history of depression, anxiety, coronary artery disease, recent stent placement, knee pain, testosterone deficiency was evaluated by telemedicine today.  Patient today reports he is currently doing well on the current medication regimen.  He denies any significant sadness, crying spells, anxiety or sleep problems.  Patient reports he is compliant on his medications.  He denies side effects.  He reports his knee joint pain is getting better and he is active, rides his bike regularly.   Denies suicidality, homicidality or perceptual disturbances.  He denies any concerns today.   Visit Diagnosis:    ICD-10-CM   1. GAD (generalized anxiety disorder)  F41.1 escitalopram (LEXAPRO) 10 MG tablet  2. MDD (major depressive disorder), recurrent episode, mild (HCC)  F33.0 escitalopram (LEXAPRO) 10 MG tablet    buPROPion (WELLBUTRIN) 100 MG tablet  3. MDD (major depressive disorder), recurrent episode, mild (HCC)   F33.0 escitalopram (LEXAPRO) 10 MG tablet    buPROPion (WELLBUTRIN) 100 MG tablet   improving    Past Psychiatric History: Reviewed past psychiatric history from my progress note on 12/16/2017.  Past Medical History:  Past Medical History:  Diagnosis Date  . Arthritis   . Coronary artery disease   . Depression   . Hyperlipidemia     Past Surgical History:  Procedure Laterality Date  . ANKLE FRACTURE SURGERY Right   . CARDIAC CATHETERIZATION N/A 04/26/2015   Procedure: Left Heart Cath;  Surgeon: Dionisio David, MD;  Location: [facility];  Service: Cardiovascular;  Laterality: N/A;  . CORONARY ANGIOPLASTY    . CORONARY STENT INTERVENTION N/A 03/27/2018   Procedure: CORONARY STENT INTERVENTION;  Surgeon: Yolonda Kida, MD;  Location: [facility];  Service: Cardiovascular;  Laterality: N/A;  . EYE SURGERY Bilateral    cataract  . LEFT HEART CATH AND CORONARY ANGIOGRAPHY Left 03/27/2018   Procedure: Left Heart Cath with possible coronary intervention;  Surgeon: Dionisio David, MD;  Location: [facility];  Service: Cardiovascular;  Laterality: Left;  . TOTAL KNEE ARTHROPLASTY Right 10/08/2018   Procedure: TOTAL KNEE ARTHROPLASTY;  Surgeon: Lovell [name], MD;  Location: [facility];  Service: Orthopedics;  Laterality: Right;    Family Psychiatric History: I have reviewed family psychiatric history from my progress note on 12/16/2017.  Family History:  Family History  Problem Relation Age of Onset  . Diabetes Mother   . Hypertension Mother   . Cancer Father   . Depression Father   . Anxiety  disorder Father   . Depression Daughter     Social History: Reviewed social history from my progress note on 12/16/2017. Social History   Socioeconomic History  . Marital status: Married    Spouse name: ann  . Number of children: 2  . Years of education: Not on file  . Highest education level: Master's degree (e.g., MA, MS, MEng, MEd, MSW, MBA)  Occupational  History    Comment: retired  Scientific laboratory technician  . Financial resource strain: Not hard at all  . Food insecurity    Worry: Never true    Inability: Never true  . Transportation needs    Medical: No    Non-medical: No  Tobacco Use  . Smoking status: Former Smoker    Years: 30.00    Types: Cigars    Quit date: 07/18/2006    Years since quitting: 12.6  . Smokeless tobacco: Never Used  Substance and Sexual Activity  . Alcohol use: No  . Drug use: No  . Sexual activity: Not Currently  Lifestyle  . Physical activity    Days per week: 0 days    Minutes per session: 0 min  . Stress: Not at all  Relationships  . Social connections    Talks on phone: More than three times a week    Gets together: More than three times a week    Attends religious service: More than 4 times per year    Active member of club or organization: Yes    Attends meetings of clubs or organizations: More than 4 times per year    Relationship status: Married  Other Topics Concern  . Not on file  Social History Narrative  . Not on file    Allergies:  Allergies  Allergen Reactions  . Clonidine Derivatives Shortness Of Breath and Other (See Comments)    Low heart rate    Metabolic Disorder Labs: No results found for: HGBA1C, MPG No results found for: PROLACTIN Lab Results  Component Value Date   CHOL 100 11/26/2018   TRIG 249 (H) 11/26/2018   HDL 29 (L) 11/26/2018   CHOLHDL 3.4 11/26/2018   VLDL 37 03/28/2018   LDLCALC 21 11/26/2018   LDLCALC 45 03/28/2018   Lab Results  Component Value Date   TSH 1.150 11/26/2018   TSH 1.030 02/05/2017    Therapeutic Level Labs: No results found for: LITHIUM No results found for: VALPROATE No components found for:  CBMZ  Current Medications: Current Outpatient Medications  Medication Sig Dispense Refill  . amLODipine (NORVASC) 5 MG tablet Take 1 tablet (5 mg total) by mouth daily. 90 tablet 4  . aspirin EC 81 MG EC tablet Take 1 tablet (81 mg total) by  mouth daily. (Patient taking differently: Take 81 mg by mouth at bedtime. )    . buPROPion (WELLBUTRIN) 100 MG tablet Take 1 tablet (100 mg total) by mouth daily. 90 tablet 1  . Cholecalciferol ([employer] VIT D 5000 HIGH-POTENCY) 5000 units capsule Take 5,000 Units by mouth daily.    . clopidogrel (PLAVIX) 75 MG tablet Take 75 mg by mouth daily.    Marland Kitchen dutasteride (AVODART) 0.5 MG capsule Take 1 capsule (0.5 mg total) by mouth every evening. 90 capsule 4  . escitalopram (LEXAPRO) 10 MG tablet Take 1.5 tablets (15 mg total) by mouth at bedtime. 135 tablet 1  . fluticasone (FLONASE) 50 MCG/ACT nasal spray Place 2 sprays into both nostrils daily. 16 g 6  . hydrALAZINE (APRESOLINE) 100 MG tablet  Take 50-100 mg by mouth See admin instructions. Take 50 mg by mouth in the morning and take 100 mg by mouth at bedtime    . hydrochlorothiazide (HYDRODIURIL) 25 MG tablet Take 1 tablet (25 mg total) by mouth daily. 90 tablet 4  . hydrocortisone (ANUSOL-HC) 2.5 % rectal cream APPLY RECTALLY TWICE DAILY AS DIRECTED (Patient taking differently: Place 1 application rectally 2 (two) times daily as needed for hemorrhoids or anal itching. ) 30 g 0  . losartan (COZAAR) 100 MG tablet Take 1 tablet (100 mg total) by mouth daily. 90 tablet 4  . niacin (NIASPAN) 1000 MG CR tablet Take 2 tablets (2,000 mg total) by mouth at bedtime. 180 tablet 4  . pantoprazole (PROTONIX) 20 MG tablet Take 20 mg by mouth daily.     . rosuvastatin (CRESTOR) 20 MG tablet Take 1 tablet (20 mg total) by mouth every evening. 90 tablet 4  . tamsulosin (FLOMAX) 0.4 MG CAPS capsule TAKE 1 CAPSULE BY MOUTH EVERY DAY 90 capsule 2  . testosterone cypionate (DEPOTESTOSTERONE CYPIONATE) 200 MG/ML injection INJECT 0.75ML IM EVERY 10 DAYS 10 mL 1  . VASCEPA 1 g CAPS Take 2 g by mouth 2 (two) times daily.    . vitamin B-12 (CYANOCOBALAMIN) 1000 MCG tablet Take 1,000 mcg by mouth daily.     No current facility-administered medications for this visit.       Musculoskeletal: Strength & Muscle Tone: within normal limits Gait & Station: normal Patient leans: N/A  Psychiatric Specialty Exam: Review of Systems  Psychiatric/Behavioral: The patient is not nervous/anxious.   All other systems reviewed and are negative.   There were no vitals taken for this visit.There is no height or weight on file to calculate BMI.  General Appearance: Casual  Eye Contact:  Fair  Speech:  Clear and Coherent  Volume:  Normal  Mood:  Euthymic  Affect:  Congruent  Thought Process:  Goal Directed and Descriptions of Associations: Intact  Orientation:  Full (Time, Place, and Person)  Thought Content: Logical   Suicidal Thoughts:  No  Homicidal Thoughts:  No  Memory:  Immediate;   Fair Recent;   Fair Remote;   Fair  Judgement:  Fair  Insight:  Fair  Psychomotor Activity:  Normal  Concentration:  Concentration: Fair and Attention Span: Fair  Recall:  [employer] of Knowledge: Fair  Language: Fair  Akathisia:  No  Handed:  Right  AIMS (if indicated):denies tremors, rigidity  Assets:  Communication Skills Desire for Improvement Social Support  ADL's:  Intact  Cognition: WNL  Sleep:  Fair   Screenings: PHQ2-9     Office Visit from 11/26/2018 in [facility] from 10/11/2017 in [facility] Visit from 03/12/2017 in [place] Visit from 02/02/2016 in [facility]  PHQ-2 Total Score  0  6  1  1   PHQ-9 Total Score  0  24  -  -       Assessment and Plan: Cassiel is a 68 year old Caucasian male who has a history of MDD, anxiety disorder, multiple medical problems including essential hypertension, coronary artery disease status post stent placement, hyperlipidemia, BPH, knee pain, testosterone deficiency was evaluated by telemedicine today.  Patient is currently doing well on the current medication regimen.  Plan as noted below.  Plan MDD-stable Wellbutrin 100 mg p.o.  daily Lexapro 15 mg p.o. daily-reduced dosage.  For GAD-improving Lexapro 15 mg p.o. daily Hydroxyzine 25 mg p.o. twice daily  PRN for severe anxiety attacks  Follow-up in clinic in 4 to 5 months or sooner if needed. November 18 at 10:30 AM.  I have spent atleast 15 minutes non face to face with patient today. More than 50 % of the time was spent for psychoeducation and supportive psychotherapy and care coordination.  This note was generated in part or whole with voice recognition software. Voice recognition is usually quite accurate but there are transcription errors that can and very often do occur. I apologize for any typographical errors that were not detected and corrected.        Ursula Alert, MD 03/03/2019, 5:46 PM

## 2019-03-21 NOTE — Progress Notes (Signed)
[facility]  [phone]) [phone] [phone]) [phone]  ID: Kenneth Pruitt OB: 17-Dec-1950  MR#: [id number]  WSF#:[id number]  Patient Care Team: Guadalupe Maple, MD as PCP - General (Family Medicine) Guadalupe Maple, MD as PCP - Family Medicine (Family Medicine) Brendolyn Patty, MD (Dermatology) Earnestine Leys, MD (Specialist) Dionisio David, MD as Consulting Physician (Cardiology) Troxler, Adele Schilder as Attending Physician (Podiatry)  CHIEF COMPLAINT: Secondary polycythemia.  INTERVAL HISTORY: Patient returns to clinic today for repeat laboratory work and further evaluation.  His appointment was delayed several months secondary to COVID-19.  He had a total knee replacement in January 2020 and is nearly fully recovered from his surgery.  He continues to remain active and recently was able to cycle greater than 65 miles.  He has no neurologic complaints. Patient denies any recent fevers or illnesses. He has a good appetite and denies weight loss.  He denies any chest pain, shortness of breath, cough, or hemoptysis.  He denies any nausea, vomiting, constipation, or diarrhea. He has no urinary complaints.  Patient offers no further specific complaints today.  REVIEW OF SYSTEMS:   Review of Systems  Constitutional: Negative.  Negative for fever, malaise/fatigue and weight loss.  Respiratory: Negative.  Negative for cough and shortness of breath.   Cardiovascular: Negative.  Negative for chest pain and leg swelling.  Gastrointestinal: Negative.  Negative for abdominal pain, blood in stool and melena.  Genitourinary: Negative.  Negative for dysuria.  Musculoskeletal: Negative.  Negative for back pain and joint pain.  Skin: Negative.  Negative for rash.  Neurological: Negative.  Negative for dizziness, sensory change, focal weakness, weakness and headaches.  Psychiatric/Behavioral: Negative.  The patient is not nervous/anxious.     As per HPI. Otherwise, a complete  review of systems is negative.  PAST MEDICAL HISTORY: Past Medical History:  Diagnosis Date  . Arthritis   . Coronary artery disease   . Depression   . Hyperlipidemia     PAST SURGICAL HISTORY: Past Surgical History:  Procedure Laterality Date  . ANKLE FRACTURE SURGERY Right   . CARDIAC CATHETERIZATION N/A 04/26/2015   Procedure: Left Heart Cath;  Surgeon: Dionisio David, MD;  Location: [facility];  Service: Cardiovascular;  Laterality: N/A;  . CORONARY ANGIOPLASTY    . CORONARY STENT INTERVENTION N/A 03/27/2018   Procedure: CORONARY STENT INTERVENTION;  Surgeon: Yolonda Kida, MD;  Location: [facility];  Service: Cardiovascular;  Laterality: N/A;  . EYE SURGERY Bilateral    cataract  . LEFT HEART CATH AND CORONARY ANGIOGRAPHY Left 03/27/2018   Procedure: Left Heart Cath with possible coronary intervention;  Surgeon: Dionisio David, MD;  Location: [facility];  Service: Cardiovascular;  Laterality: Left;  . TOTAL KNEE ARTHROPLASTY Right 10/08/2018   Procedure: TOTAL KNEE ARTHROPLASTY;  Surgeon: Lovell [name], MD;  Location: [facility];  Service: Orthopedics;  Laterality: Right;    FAMILY HISTORY: Family History  Problem Relation Age of Onset  . Diabetes Mother   . Hypertension Mother   . Cancer Father   . Depression Father   . Anxiety disorder Father   . Depression Daughter     ADVANCED DIRECTIVES (Y/N):  N  HEALTH MAINTENANCE: Social History   Tobacco Use  . Smoking status: Former Smoker    Years: 30.00    Types: Cigars    Quit date: 07/18/2006    Years since quitting: 12.7  . Smokeless tobacco: Never Used  Substance Use Topics  .  Alcohol use: No  . Drug use: No     Colonoscopy:  PAP:  Bone density:  Lipid panel:  Allergies  Allergen Reactions  . Clonidine Derivatives Shortness Of Breath and Other (See Comments)    Low heart rate    Current Outpatient Medications  Medication Sig Dispense Refill  . amLODipine (NORVASC)  5 MG tablet Take 1 tablet (5 mg total) by mouth daily. 90 tablet 4  . aspirin EC 81 MG EC tablet Take 1 tablet (81 mg total) by mouth daily. (Patient taking differently: Take 81 mg by mouth at bedtime. )    . buPROPion (WELLBUTRIN) 100 MG tablet Take 1 tablet (100 mg total) by mouth daily. 90 tablet 1  . Cholecalciferol ([employer]S VIT D 5000 HIGH-POTENCY) 5000 units capsule Take 5,000 Units by mouth daily.    . clopidogrel (PLAVIX) 75 MG tablet Take 75 mg by mouth daily.    Marland Kitchen dutasteride (AVODART) 0.5 MG capsule Take 1 capsule (0.5 mg total) by mouth every evening. 90 capsule 4  . escitalopram (LEXAPRO) 10 MG tablet Take 1.5 tablets (15 mg total) by mouth at bedtime. 135 tablet 1  . fluticasone (FLONASE) 50 MCG/ACT nasal spray Place 2 sprays into both nostrils daily. 16 g 6  . hydrALAZINE (APRESOLINE) 100 MG tablet Take 50-100 mg by mouth See admin instructions. Take 50 mg by mouth in the morning and take 100 mg by mouth at bedtime    . hydrochlorothiazide (HYDRODIURIL) 25 MG tablet Take 1 tablet (25 mg total) by mouth daily. 90 tablet 4  . hydrocortisone (ANUSOL-HC) 2.5 % rectal cream APPLY RECTALLY TWICE DAILY AS DIRECTED (Patient taking differently: Place 1 application rectally 2 (two) times daily as needed for hemorrhoids or anal itching. ) 30 g 0  . losartan (COZAAR) 100 MG tablet Take 1 tablet (100 mg total) by mouth daily. 90 tablet 4  . niacin (NIASPAN) 1000 MG CR tablet Take 2 tablets (2,000 mg total) by mouth at bedtime. 180 tablet 4  . pantoprazole (PROTONIX) 20 MG tablet Take 20 mg by mouth daily.     . rosuvastatin (CRESTOR) 20 MG tablet Take 1 tablet (20 mg total) by mouth every evening. 90 tablet 4  . tamsulosin (FLOMAX) 0.4 MG CAPS capsule TAKE 1 CAPSULE BY MOUTH EVERY DAY 90 capsule 2  . testosterone cypionate (DEPOTESTOSTERONE CYPIONATE) 200 MG/ML injection INJECT 0.75ML IM EVERY 10 DAYS 10 mL 1  . vitamin B-12 (CYANOCOBALAMIN) 1000 MCG tablet Take 1,000 mcg by mouth daily.    .  meloxicam (MOBIC) 15 MG tablet TAKE ONE TABLET BY MOUTH EVERY DAY 30 tablet 5  . VASCEPA 1 g CAPS Take 2 g by mouth 2 (two) times daily.     No current facility-administered medications for this visit.     OBJECTIVE: There were no vitals filed for this visit.   There is no height or weight on file to calculate BMI.    ECOG FS:0 - Asymptomatic  General: Well-developed, well-nourished, no acute distress. Eyes: Pink conjunctiva, anicteric sclera. HEENT: Normocephalic, moist mucous membranes. Lungs: Clear to auscultation bilaterally. Heart: Regular rate and rhythm. No rubs, murmurs, or gallops. Abdomen: Soft, nontender, nondistended. No organomegaly noted, normoactive bowel sounds. Musculoskeletal: No edema, cyanosis, or clubbing. Neuro: Alert, answering all questions appropriately. Cranial nerves grossly intact. Skin: No rashes or petechiae noted. Psych: Normal affect.  LAB RESULTS:  Lab Results  Component Value Date   NA 140 11/26/2018   K 3.9 11/26/2018   CL 99 11/26/2018  CO2 28 11/26/2018   GLUCOSE 89 11/26/2018   BUN 18 11/26/2018   CREATININE 1.22 11/26/2018   CALCIUM 9.5 11/26/2018   PROT 7.0 11/26/2018   ALBUMIN 4.2 11/26/2018   AST 22 11/26/2018   ALT 15 11/26/2018   ALKPHOS 63 11/26/2018   BILITOT 0.3 11/26/2018   GFRNONAA 61 11/26/2018   GFRAA 70 11/26/2018    Lab Results  Component Value Date   WBC 7.9 03/26/2019   NEUTROABS 5.7 03/26/2019   HGB 17.0 03/26/2019   HCT 50.4 03/26/2019   MCV 88.6 03/26/2019   PLT 205 03/26/2019     STUDIES: No results found.  ASSESSMENT: Secondary polycythemia  PLAN:    1. 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  Patient's hemoglobin remains stable and unchanged at 17.0.  Previously, it was agreed upon that his goal hemoglobin to be less than 17.0.  Patient does not wish to pursue 500 mL phlebotomy today.  Patient expressed understanding that as long as he requires testosterone, he likely will require  periodic phlebotomies.  Return to clinic in 6 months with repeat laboratory work, further evaluation, and consideration of phlebotomy. 2. Low testosterone: Patient reports he takes testosterone injections approximately every 10 days.  Continue to treatment with testosterone per urology. 3.  Hypertension: Continue monitoring and treatment per primary care. 4.  Knee replacement: Continue strengthening and rehab per orthopedics.  Patient expressed understanding and was in agreement with this plan. He also understands that He can call clinic at any time with any questions, concerns, or complaints.    Kenneth Huger, MD   03/29/2019 7:57 AM

## 2019-03-26 ENCOUNTER — Inpatient Hospital Stay: Payer: Medicare Other

## 2019-03-26 ENCOUNTER — Inpatient Hospital Stay (HOSPITAL_BASED_OUTPATIENT_CLINIC_OR_DEPARTMENT_OTHER): Payer: Medicare Other | Admitting: Oncology

## 2019-03-26 ENCOUNTER — Other Ambulatory Visit: Payer: Self-pay

## 2019-03-26 ENCOUNTER — Encounter: Payer: Self-pay | Admitting: Oncology

## 2019-03-26 ENCOUNTER — Inpatient Hospital Stay: Payer: Medicare Other | Attending: Oncology

## 2019-03-26 DIAGNOSIS — E291 Testicular hypofunction: Secondary | ICD-10-CM | POA: Diagnosis not present

## 2019-03-26 DIAGNOSIS — Z87891 Personal history of nicotine dependence: Secondary | ICD-10-CM | POA: Diagnosis not present

## 2019-03-26 DIAGNOSIS — I10 Essential (primary) hypertension: Secondary | ICD-10-CM | POA: Insufficient documentation

## 2019-03-26 DIAGNOSIS — Z96659 Presence of unspecified artificial knee joint: Secondary | ICD-10-CM | POA: Diagnosis not present

## 2019-03-26 DIAGNOSIS — D751 Secondary polycythemia: Secondary | ICD-10-CM | POA: Diagnosis not present

## 2019-03-26 DIAGNOSIS — Z808 Family history of malignant neoplasm of other organs or systems: Secondary | ICD-10-CM | POA: Diagnosis not present

## 2019-03-26 LAB — CBC WITH DIFFERENTIAL/PLATELET
Abs Immature Granulocytes: 0.02 10*3/uL (ref 0.00–0.07)
Basophils Absolute: 0 10*3/uL (ref 0.0–0.1)
Basophils Relative: 0 %
Eosinophils Absolute: 0.1 10*3/uL (ref 0.0–0.5)
Eosinophils Relative: 1 %
HCT: 50.4 % (ref 39.0–52.0)
Hemoglobin: 17 g/dL (ref 13.0–17.0)
Immature Granulocytes: 0 %
Lymphocytes Relative: 18 %
Lymphs Abs: 1.4 10*3/uL (ref 0.7–4.0)
MCH: 29.9 pg (ref 26.0–34.0)
MCHC: 33.7 g/dL (ref 30.0–36.0)
MCV: 88.6 fL (ref 80.0–100.0)
Monocytes Absolute: 0.7 10*3/uL (ref 0.1–1.0)
Monocytes Relative: 9 %
Neutro Abs: 5.7 10*3/uL (ref 1.7–7.7)
Neutrophils Relative %: 72 %
Platelets: 205 10*3/uL (ref 150–400)
RBC: 5.69 MIL/uL (ref 4.22–5.81)
RDW: 15.6 % — ABNORMAL HIGH (ref 11.5–15.5)
WBC: 7.9 10*3/uL (ref 4.0–10.5)
nRBC: 0 % (ref 0.0–0.2)

## 2019-03-26 NOTE — Progress Notes (Signed)
Patient stated that he is currently feeling "sluggish and with lightheaded".

## 2019-03-27 ENCOUNTER — Other Ambulatory Visit: Payer: Self-pay | Admitting: Family Medicine

## 2019-03-27 NOTE — Telephone Encounter (Signed)
Not on pt's current med list. Would Dr. Jeananne Rama like to prescribe

## 2019-03-27 NOTE — Telephone Encounter (Signed)
OK to wait until Monday

## 2019-04-08 ENCOUNTER — Other Ambulatory Visit: Payer: Medicare Other

## 2019-04-08 ENCOUNTER — Ambulatory Visit: Payer: Medicare Other | Admitting: Oncology

## 2019-04-30 ENCOUNTER — Ambulatory Visit (INDEPENDENT_AMBULATORY_CARE_PROVIDER_SITE_OTHER): Payer: Medicare Other

## 2019-04-30 VITALS — BP 132/74 | HR 76 | Ht 73.0 in | Wt 215.0 lb

## 2019-04-30 DIAGNOSIS — Z0000 Encounter for general adult medical examination without abnormal findings: Secondary | ICD-10-CM

## 2019-04-30 NOTE — Patient Instructions (Signed)
Kenneth Pruitt , Thank you for taking time to come for your Medicare Wellness Visit. I appreciate your ongoing commitment to your health goals. Please review the following plan we discussed and let me know if I can assist you in the future.   Screening recommendations/referrals: Colonoscopy: declined Recommended yearly ophthalmology/optometry visit for glaucoma screening and checkup Recommended yearly dental visit for hygiene and checkup  Vaccinations: Influenza vaccine: due 05/2019 Pneumococcal vaccine: up to date Tdap vaccine: up to date Shingles vaccine: shingrix eligible, check with your insurance company for coverage    Advanced directives: Advance directive discussed with you today. I have provided a copy for you to complete at home and have notarized. Once this is complete please bring a copy in to our office so we can scan it into your chart.  Conditions/risks identified: none  Next appointment: Follow up in one year for your annual wellness visit   Preventive Care 65 Years and Older, Male Preventive care refers to lifestyle choices and visits with your health care provider that can promote health and wellness. What does preventive care include?  A yearly physical exam. This is also called an annual well check.  Dental exams once or twice a year.  Routine eye exams. Ask your health care provider how often you should have your eyes checked.  Personal lifestyle choices, including:  Daily care of your teeth and gums.  Regular physical activity.  Eating a healthy diet.  Avoiding tobacco and drug use.  Limiting alcohol use.  Practicing safe sex.  Taking low doses of aspirin every day.  Taking vitamin and mineral supplements as recommended by your health care provider. What happens during an annual well check? The services and screenings done by your health care provider during your annual well check will depend on your age, overall health, lifestyle risk factors, and  family history of disease. Counseling  Your health care provider may ask you questions about your:  Alcohol use.  Tobacco use.  Drug use.  Emotional well-being.  Home and relationship well-being.  Sexual activity.  Eating habits.  History of falls.  Memory and ability to understand (cognition).  Work and work Statistician. Screening  You may have the following tests or measurements:  Height, weight, and BMI.  Blood pressure.  Lipid and cholesterol levels. These may be checked every 5 years, or more frequently if you are over 44 years old.  Skin check.  Lung cancer screening. You may have this screening every year starting at age 73 if you have a 30-pack-year history of smoking and currently smoke or have quit within the past 15 years.  Fecal occult blood test (FOBT) of the stool. You may have this test every year starting at age 36.  Flexible sigmoidoscopy or colonoscopy. You may have a sigmoidoscopy every 5 years or a colonoscopy every 10 years starting at age 78.  Prostate cancer screening. Recommendations will vary depending on your family history and other risks.  Hepatitis C blood test.  Hepatitis B blood test.  Sexually transmitted disease (STD) testing.  Diabetes screening. This is done by checking your blood sugar (glucose) after you have not eaten for a while (fasting). You may have this done every 1-3 years.  Abdominal aortic aneurysm (AAA) screening. You may need this if you are a current or former smoker.  Osteoporosis. You may be screened starting at age 33 if you are at high risk. Talk with your health care provider about your test results, treatment options, and if  necessary, the need for more tests. Vaccines  Your health care provider may recommend certain vaccines, such as:  Influenza vaccine. This is recommended every year.  Tetanus, diphtheria, and acellular pertussis (Tdap, Td) vaccine. You may need a Td booster every 10 years.  Zoster  vaccine. You may need this after age 24.  Pneumococcal 13-valent conjugate (PCV13) vaccine. One dose is recommended after age 27.  Pneumococcal polysaccharide (PPSV23) vaccine. One dose is recommended after age 23. Talk to your health care provider about which screenings and vaccines you need and how often you need them. This information is not intended to replace advice given to you by your health care provider. Make sure you discuss any questions you have with your health care provider. Document Released: 09/30/2015 Document Revised: 05/23/2016 Document Reviewed: 07/05/2015 Elsevier Interactive Patient Education  2017 [place] Prevention in the Home Falls can cause injuries. They can happen to people of all ages. There are many things you can do to make your home safe and to help prevent falls. What can I do on the outside of my home?  Regularly fix the edges of walkways and driveways and fix any cracks.  Remove anything that might make you trip as you walk through a door, such as a raised step or threshold.  Trim any bushes or trees on the path to your home.  Use bright outdoor lighting.  Clear any walking paths of anything that might make someone trip, such as rocks or tools.  Regularly check to see if handrails are loose or broken. Make sure that both sides of any steps have handrails.  Any raised decks and porches should have guardrails on the edges.  Have any leaves, snow, or ice cleared regularly.  Use sand or salt on walking paths during winter.  Clean up any spills in your garage right away. This includes oil or grease spills. What can I do in the bathroom?  Use night lights.  Install grab bars by the toilet and in the tub and shower. Do not use towel bars as grab bars.  Use non-skid mats or decals in the tub or shower.  If you need to sit down in the shower, use a plastic, non-slip stool.  Keep the floor dry. Clean up any water that spills on the  floor as soon as it happens.  Remove soap buildup in the tub or shower regularly.  Attach bath mats securely with double-sided non-slip rug tape.  Do not have throw rugs and other things on the floor that can make you trip. What can I do in the bedroom?  Use night lights.  Make sure that you have a light by your bed that is easy to reach.  Do not use any sheets or blankets that are too big for your bed. They should not hang down onto the floor.  Have a firm chair that has side arms. You can use this for support while you get dressed.  Do not have throw rugs and other things on the floor that can make you trip. What can I do in the kitchen?  Clean up any spills right away.  Avoid walking on wet floors.  Keep items that you use a lot in easy-to-reach places.  If you need to reach something above you, use a strong step stool that has a grab bar.  Keep electrical cords out of the way.  Do not use floor polish or wax that makes floors slippery. If you must  use wax, use non-skid floor wax.  Do not have throw rugs and other things on the floor that can make you trip. What can I do with my stairs?  Do not leave any items on the stairs.  Make sure that there are handrails on both sides of the stairs and use them. Fix handrails that are broken or loose. Make sure that handrails are as long as the stairways.  Check any carpeting to make sure that it is firmly attached to the stairs. Fix any carpet that is loose or worn.  Avoid having throw rugs at the top or bottom of the stairs. If you do have throw rugs, attach them to the floor with carpet tape.  Make sure that you have a light switch at the top of the stairs and the bottom of the stairs. If you do not have them, ask someone to add them for you. What else can I do to help prevent falls?  Wear shoes that:  Do not have high heels.  Have rubber bottoms.  Are comfortable and fit you well.  Are closed at the toe. Do not wear  sandals.  If you use a stepladder:  Make sure that it is fully opened. Do not climb a closed stepladder.  Make sure that both sides of the stepladder are locked into place.  Ask someone to hold it for you, if possible.  Clearly mark and make sure that you can see:  Any grab bars or handrails.  First and last steps.  Where the edge of each step is.  Use tools that help you move around (mobility aids) if they are needed. These include:  Canes.  Walkers.  Scooters.  Crutches.  Turn on the lights when you go into a dark area. Replace any light bulbs as soon as they burn out.  Set up your furniture so you have a clear path. Avoid moving your furniture around.  If any of your floors are uneven, fix them.  If there are any pets around you, be aware of where they are.  Review your medicines with your doctor. Some medicines can make you feel dizzy. This can increase your chance of falling. Ask your doctor what other things that you can do to help prevent falls. This information is not intended to replace advice given to you by your health care provider. Make sure you discuss any questions you have with your health care provider. Document Released: 06/30/2009 Document Revised: 02/09/2016 Document Reviewed: 10/08/2014 Elsevier Interactive Patient Education  2017 [employer].

## 2019-04-30 NOTE — Progress Notes (Signed)
Subjective:   Kenneth Pruitt is a 68 y.o. male who presents for Medicare Annual/Subsequent preventive examination.  This visit is being conducted via phone call  - after an attmept to do on video chat - due to the COVID-19 pandemic. This patient has given me verbal consent via phone to conduct this visit, patient states they are participating from their home address. Some vital signs may be absent or patient reported.   Patient identification: identified by name, DOB, and current address.    Review of Systems:   Cardiac Risk Factors include: advanced age (>21men, >70 women);male gender;hypertension;dyslipidemia     Objective:    Vitals: BP 132/74 Comment: pt reported  Pulse 76 Comment: pt reported  Ht 6\' 1"  (1.854 m)   Wt 215 lb (97.5 kg) Comment: pt reported  BMI 28.37 kg/m   Body mass index is 28.37 kg/m.  Advanced Directives 04/30/2019 03/26/2019 10/08/2018 10/08/2018 09/24/2018 06/09/2018 03/27/2018  Does Patient Have a Medical Advance Directive? No No No No No No No  Type of Advance Directive - - - - - - -  Would patient like information on creating a medical advance directive? - No - Patient declined No - Patient declined - - No - Patient declined No - Patient declined  Some encounter information is confidential and restricted. Go to Review Flowsheets activity to see all data.    Tobacco Social History   Tobacco Use  Smoking Status Former Smoker  . Years: 30.00  . Types: Cigars  . Quit date: 07/18/2006  . Years since quitting: 12.7  Smokeless Tobacco Never Used     Counseling given: Not Answered   Clinical Intake:  Pre-visit preparation completed: Yes  Pain : No/denies pain Pain Type: Neuropathic pain Pain Location: Neck Pain Descriptors / Indicators: Spasm Pain Onset: More than a month ago Pain Frequency: Constant     Nutritional Status: BMI 25 -29 Overweight Nutritional Risks: None Diabetes: No  How often do you need to have someone help you when you  read instructions, pamphlets, or other written materials from your doctor or pharmacy?: 1 - Never  Interpreter Needed?: No  Information entered by ::  ,LPN  Past Medical History:  Diagnosis Date  . Arthritis   . Coronary artery disease   . Depression   . Hyperlipidemia    Past Surgical History:  Procedure Laterality Date  . ANKLE FRACTURE SURGERY Right   . CARDIAC CATHETERIZATION N/A 04/26/2015   Procedure: Left Heart Cath;  Surgeon: Dionisio David, MD;  Location: [facility];  Service: Cardiovascular;  Laterality: N/A;  . CORONARY ANGIOPLASTY    . CORONARY STENT INTERVENTION N/A 03/27/2018   Procedure: CORONARY STENT INTERVENTION;  Surgeon: Yolonda Kida, MD;  Location: [facility];  Service: Cardiovascular;  Laterality: N/A;  . EYE SURGERY Bilateral    cataract  . LEFT HEART CATH AND CORONARY ANGIOGRAPHY Left 03/27/2018   Procedure: Left Heart Cath with possible coronary intervention;  Surgeon: Dionisio David, MD;  Location: [facility];  Service: Cardiovascular;  Laterality: Left;  . TOTAL KNEE ARTHROPLASTY Right 10/08/2018   Procedure: TOTAL KNEE ARTHROPLASTY;  Surgeon: Lovell [name], MD;  Location: [facility];  Service: Orthopedics;  Laterality: Right;   Family History  Problem Relation Age of Onset  . Diabetes Mother   . Hypertension Mother   . Cancer Father   . Depression Father   . Anxiety disorder Father   . Depression Daughter  Social History   Socioeconomic History  . Marital status: Married    Spouse name: ann  . Number of children: 2  . Years of education: Not on file  . Highest education level: Master's degree (e.g., MA, MS, MEng, MEd, MSW, MBA)  Occupational History    Comment: retired  Scientific laboratory technician  . Financial resource strain: Not hard at all  . Food insecurity    Worry: Never true    Inability: Never true  . Transportation needs    Medical: No    Non-medical: No  Tobacco Use  . Smoking status: Former  Smoker    Years: 30.00    Types: Cigars    Quit date: 07/18/2006    Years since quitting: 12.7  . Smokeless tobacco: Never Used  Substance and Sexual Activity  . Alcohol use: No  . Drug use: No  . Sexual activity: Not Currently  Lifestyle  . Physical activity    Days per week: 0 days    Minutes per session: 0 min  . Stress: Not at all  Relationships  . Social connections    Talks on phone: More than three times a week    Gets together: More than three times a week    Attends religious service: More than 4 times per year    Active member of club or organization: Yes    Attends meetings of clubs or organizations: More than 4 times per year    Relationship status: Married  Other Topics Concern  . Not on file  Social History Narrative  . Not on file    Outpatient Encounter Medications as of 04/30/2019  Medication Sig  . amLODipine (NORVASC) 5 MG tablet Take 1 tablet (5 mg total) by mouth daily.  Marland Kitchen aspirin EC 81 MG EC tablet Take 1 tablet (81 mg total) by mouth daily. (Patient taking differently: Take 81 mg by mouth at bedtime. )  . buPROPion (WELLBUTRIN) 100 MG tablet Take 1 tablet (100 mg total) by mouth daily.  . Cholecalciferol ([employer] VIT D 5000 HIGH-POTENCY) 5000 units capsule Take 5,000 Units by mouth daily.  . clopidogrel (PLAVIX) 75 MG tablet Take 75 mg by mouth daily.  Marland Kitchen dutasteride (AVODART) 0.5 MG capsule Take 1 capsule (0.5 mg total) by mouth every evening.  . escitalopram (LEXAPRO) 10 MG tablet Take 1.5 tablets (15 mg total) by mouth at bedtime.  . fluticasone (FLONASE) 50 MCG/ACT nasal spray Place 2 sprays into both nostrils daily.  . hydrALAZINE (APRESOLINE) 100 MG tablet Take 50-100 mg by mouth See admin instructions. Take 50 mg by mouth in the morning and take 100 mg by mouth at bedtime  . hydrochlorothiazide (HYDRODIURIL) 25 MG tablet Take 1 tablet (25 mg total) by mouth daily.  . hydrocortisone (ANUSOL-HC) 2.5 % rectal cream APPLY RECTALLY TWICE DAILY AS DIRECTED  (Patient taking differently: Place 1 application rectally 2 (two) times daily as needed for hemorrhoids or anal itching. )  . losartan (COZAAR) 100 MG tablet Take 1 tablet (100 mg total) by mouth daily.  . meloxicam (MOBIC) 15 MG tablet TAKE ONE TABLET BY MOUTH EVERY DAY  . niacin (NIASPAN) 1000 MG CR tablet Take 2 tablets (2,000 mg total) by mouth at bedtime.  . pantoprazole (PROTONIX) 20 MG tablet Take 20 mg by mouth daily.   . rosuvastatin (CRESTOR) 20 MG tablet Take 1 tablet (20 mg total) by mouth every evening.  . tamsulosin (FLOMAX) 0.4 MG CAPS capsule TAKE 1 CAPSULE BY MOUTH EVERY DAY  .  testosterone cypionate (DEPOTESTOSTERONE CYPIONATE) 200 MG/ML injection INJECT 0.75ML IM EVERY 10 DAYS  . vitamin B-12 (CYANOCOBALAMIN) 1000 MCG tablet Take 1,000 mcg by mouth daily.  . Omega-3 1000 MG CAPS Take by mouth.  . [DISCONTINUED] VASCEPA 1 g CAPS Take 2 g by mouth 2 (two) times daily.   No facility-administered encounter medications on file as of 04/30/2019.     Activities of Daily Living In your present state of health, do you have any difficulty performing the following activities: 04/30/2019 11/26/2018  Hearing? N N  Comment no hearing aids -  Vision? N N  Comment eyeglasses -  Difficulty concentrating or making decisions? N N  Walking or climbing stairs? N N  Dressing or bathing? N N  Doing errands, shopping? N N  Preparing Food and eating ? N -  Using the Toilet? N -  In the past six months, have you accidently leaked urine? N -  Do you have problems with loss of bowel control? N -  Managing your Medications? N -  Managing your Finances? N -  Housekeeping or managing your Housekeeping? N -  Some recent data might be hidden    Patient Care Team: Guadalupe Maple, MD as PCP - General (Family Medicine) Guadalupe Maple, MD as PCP - Family Medicine (Family Medicine) Brendolyn Patty, MD (Dermatology) Earnestine Leys, MD (Specialist) Dionisio David, MD as Consulting Physician  (Cardiology) Elvina Mattes, Adele Schilder as Attending Physician (Podiatry)   Assessment:   This is a routine wellness examination for Kasim.  Exercise Activities and Dietary recommendations Current Exercise Habits: Home exercise routine, Type of exercise: walking, Time (Minutes): > 60, Frequency (Times/Week): 5, Weekly Exercise (Minutes/Week): 0, Intensity: Moderate, Exercise limited by: neurologic condition(s)  Goals    . DIET - INCREASE WATER INTAKE     Recommend drinking at least 6-8 glasses of water a day        Fall Risk: Fall Risk  04/30/2019 11/26/2018 03/26/2018 02/20/2018 12/11/2017  Falls in the past year? 1 0 No No No  Number falls in past yr: 1 0 - - -  Injury with Fall? 0 0 - - -  Risk for fall due to : Impaired balance/gait - - - -  Follow up - Falls evaluation completed - - -    FALL RISK PREVENTION PERTAINING TO THE HOME:  Any stairs in or around the home? Yes  If so, are there any without handrails? No   Home free of loose throw rugs in walkways, pet beds, electrical cords, etc? Yes  Adequate lighting in your home to reduce risk of falls? Yes   ASSISTIVE DEVICES UTILIZED TO PREVENT FALLS:  Life alert? No  Use of a cane, walker or w/c? No   Has walker if needed to use  Grab bars in the bathroom? Yes  Shower chair or bench in shower? No  Elevated toilet seat or a handicapped toilet? No   TIMED UP AND GO:  Unable to perform   Depression Screen PHQ 2/9 Scores 04/30/2019 11/26/2018 10/11/2017 03/12/2017  PHQ - 2 Score 0 0 6 1  PHQ- 9 Score - 0 24 -    Cognitive Function     6CIT Screen 10/11/2017  What Year? 0 points  What month? 0 points  What time? 0 points  Count back from 20 0 points  Months in reverse 0 points  Repeat phrase 0 points  Total Score 0    Immunization History  Administered Date(s) Administered  .  Influenza, High Dose Seasonal PF 07/18/2017  . Influenza,inj,Quad PF,6+ Mos 09/13/2015  . Influenza-Unspecified 07/13/2016, 07/28/2018  .  Pneumococcal Conjugate-13 10/11/2017  . Pneumococcal Polysaccharide-23 11/26/2018  . Tdap 07/30/2011  . Zoster 07/30/2011    Qualifies for Shingles Vaccine? Yes  Zostavax completed 07/30/2011. Due for Shingrix. Education has been provided regarding the importance of this vaccine. Pt has been advised to call insurance company to determine out of pocket expense. Advised may also receive vaccine at local pharmacy or Health Dept. Verbalized acceptance and understanding.  Tdap: up to date   Flu Vaccine: Due 05/2019  Pneumococcal Vaccine: up to date   Screening Tests Health Maintenance  Topic Date Due  . INFLUENZA VACCINE  04/18/2019  . COLONOSCOPY  11/26/2019 (Originally 11/06/2017)  . TETANUS/TDAP  07/29/2021  . Hepatitis C Screening  Completed  . PNA vac Low Risk Adult  Completed   Cancer Screenings:  Colorectal Screening: declined, cologuard discussed.   Lung Cancer Screening: (Low Dose CT Chest recommended if Age 86-80 years, 30 pack-year currently smoking OR have quit w/in 15years.) does not qualify.     Additional Screening:  Hepatitis C Screening: does qualify; Completed 11/17/2015  Vision Screening: Recommended annual ophthalmology exams for early detection of glaucoma and other disorders of the eye. Is the patient up to date with their annual eye exam?  Yes  Who is the provider or what is the name of the office in which the pt attends annual eye exams? Goes in [place] Screening: Recommended annual dental exams for proper oral hygiene  Community Resource Referral:  CRR required this visit?  No        Plan:  I have personally reviewed and addressed the Medicare Annual Wellness questionnaire and have noted the following in the patient's chart:  A. Medical and social history B. Use of alcohol, tobacco or illicit drugs  C. Current medications and supplements D. Functional ability and status E.  Nutritional status F.  Physical activity G. Advance  directives H. List of other physicians I.  Hospitalizations, surgeries, and ER visits in previous 12 months J.  [place] such as hearing and vision if needed, cognitive and depression L. Referrals and appointments   In addition, I have reviewed and discussed with patient certain preventive protocols, quality metrics, and best practice recommendations. A written personalized care plan for preventive services as well as general preventive health recommendations were provided to patient.   Signed,   Bevelyn Ngo, LPN  1/32/4401 Nurse Health Advisor   Nurse Notes: none

## 2019-05-06 ENCOUNTER — Other Ambulatory Visit: Payer: Self-pay | Admitting: Neurology

## 2019-05-06 DIAGNOSIS — R29898 Other symptoms and signs involving the musculoskeletal system: Secondary | ICD-10-CM

## 2019-05-10 DIAGNOSIS — R29898 Other symptoms and signs involving the musculoskeletal system: Secondary | ICD-10-CM | POA: Insufficient documentation

## 2019-05-12 ENCOUNTER — Other Ambulatory Visit: Payer: Self-pay

## 2019-05-12 ENCOUNTER — Ambulatory Visit
Admission: RE | Admit: 2019-05-12 | Discharge: 2019-05-12 | Disposition: A | Discharge status: Home / Self Care | Payer: Medicare Other | Source: Ambulatory Visit | Attending: Neurology | Admitting: Neurology

## 2019-05-12 DIAGNOSIS — R29898 Other symptoms and signs involving the musculoskeletal system: Secondary | ICD-10-CM | POA: Diagnosis not present

## 2019-05-19 DIAGNOSIS — N183 Chronic kidney disease, stage 3 unspecified: Secondary | ICD-10-CM | POA: Insufficient documentation

## 2019-05-19 DIAGNOSIS — G4733 Obstructive sleep apnea (adult) (pediatric): Secondary | ICD-10-CM | POA: Insufficient documentation

## 2019-05-19 DIAGNOSIS — R7309 Other abnormal glucose: Secondary | ICD-10-CM | POA: Insufficient documentation

## 2019-05-19 HISTORY — PX: OTHER SURGICAL HISTORY: SHX169

## 2019-05-26 DIAGNOSIS — J309 Allergic rhinitis, unspecified: Secondary | ICD-10-CM | POA: Insufficient documentation

## 2019-05-28 DIAGNOSIS — I951 Orthostatic hypotension: Secondary | ICD-10-CM | POA: Insufficient documentation

## 2019-05-28 MED ORDER — PANTOPRAZOLE SODIUM 40 MG PO TBEC
40.00 | DELAYED_RELEASE_TABLET | ORAL | Status: DC
Start: 2019-05-29 — End: 2019-05-28

## 2019-05-28 MED ORDER — ESCITALOPRAM OXALATE 10 MG PO TABS
15.00 | ORAL_TABLET | ORAL | Status: DC
Start: 2019-05-28 — End: 2019-05-28

## 2019-05-28 MED ORDER — HYDRALAZINE HCL 50 MG PO TABS
100.00 | ORAL_TABLET | ORAL | Status: DC
Start: 2019-05-28 — End: 2019-05-28

## 2019-05-28 MED ORDER — SENNOSIDES-DOCUSATE SODIUM 8.6-50 MG PO TABS
2.00 | ORAL_TABLET | ORAL | Status: DC
Start: 2019-05-28 — End: 2019-05-28

## 2019-05-28 MED ORDER — LOSARTAN POTASSIUM 50 MG PO TABS
100.00 | ORAL_TABLET | ORAL | Status: DC
Start: 2019-05-28 — End: 2019-05-28

## 2019-05-28 MED ORDER — BUPROPION HCL 100 MG PO TABS
100.00 | ORAL_TABLET | ORAL | Status: DC
Start: 2019-05-29 — End: 2019-05-28

## 2019-05-28 MED ORDER — NIACIN ER 500 MG PO CPCR
2000.00 | ORAL_CAPSULE | ORAL | Status: DC
Start: 2019-05-28 — End: 2019-05-28

## 2019-05-28 MED ORDER — MELATONIN 3 MG PO TABS
3.00 | ORAL_TABLET | ORAL | Status: DC
Start: 2019-05-28 — End: 2019-05-28

## 2019-05-28 MED ORDER — TAMSULOSIN HCL 0.4 MG PO CAPS
0.40 | ORAL_CAPSULE | ORAL | Status: DC
Start: ? — End: 2019-05-28

## 2019-05-28 MED ORDER — OXYCODONE HCL 5 MG PO TABS
5.00 | ORAL_TABLET | ORAL | Status: DC
Start: ? — End: 2019-05-28

## 2019-05-28 MED ORDER — GENERIC EXTERNAL MEDICATION
Status: DC
Start: ? — End: 2019-05-28

## 2019-05-28 MED ORDER — BISACODYL 10 MG RE SUPP
10.00 | RECTAL | Status: DC
Start: ? — End: 2019-05-28

## 2019-05-28 MED ORDER — ENOXAPARIN SODIUM 40 MG/0.4ML ~~LOC~~ SOLN
40.00 | SUBCUTANEOUS | Status: DC
Start: 2019-05-29 — End: 2019-05-28

## 2019-05-28 MED ORDER — METHOCARBAMOL 500 MG PO TABS
500.00 | ORAL_TABLET | ORAL | Status: DC
Start: 2019-05-28 — End: 2019-05-28

## 2019-05-28 MED ORDER — ACETAMINOPHEN 325 MG PO TABS
975.00 | ORAL_TABLET | ORAL | Status: DC
Start: 2019-05-28 — End: 2019-05-28

## 2019-05-28 MED ORDER — POLYETHYLENE GLYCOL 3350 17 G PO PACK
17.00 | PACK | ORAL | Status: DC
Start: 2019-05-28 — End: 2019-05-28

## 2019-05-28 MED ORDER — FINASTERIDE 5 MG PO TABS
5.00 | ORAL_TABLET | ORAL | Status: DC
Start: 2019-05-28 — End: 2019-05-28

## 2019-05-28 MED ORDER — AMLODIPINE BESYLATE 5 MG PO TABS
5.00 | ORAL_TABLET | ORAL | Status: DC
Start: 2019-05-29 — End: 2019-05-28

## 2019-06-26 ENCOUNTER — Other Ambulatory Visit: Payer: Self-pay

## 2019-06-29 ENCOUNTER — Ambulatory Visit: Payer: Medicare Other | Admitting: Family Medicine

## 2019-07-07 ENCOUNTER — Other Ambulatory Visit: Payer: Self-pay

## 2019-07-07 ENCOUNTER — Encounter: Payer: Self-pay | Admitting: Family Medicine

## 2019-07-07 ENCOUNTER — Ambulatory Visit (INDEPENDENT_AMBULATORY_CARE_PROVIDER_SITE_OTHER): Payer: Medicare Other | Admitting: Family Medicine

## 2019-07-07 DIAGNOSIS — F331 Major depressive disorder, recurrent, moderate: Secondary | ICD-10-CM

## 2019-07-07 DIAGNOSIS — I10 Essential (primary) hypertension: Secondary | ICD-10-CM

## 2019-07-07 DIAGNOSIS — E782 Mixed hyperlipidemia: Secondary | ICD-10-CM

## 2019-07-07 DIAGNOSIS — I2510 Atherosclerotic heart disease of native coronary artery without angina pectoris: Secondary | ICD-10-CM | POA: Diagnosis not present

## 2019-07-07 DIAGNOSIS — E291 Testicular hypofunction: Secondary | ICD-10-CM | POA: Diagnosis not present

## 2019-07-07 NOTE — Assessment & Plan Note (Signed)
The current medical regimen is effective;  continue present plan and medications.  

## 2019-07-07 NOTE — Progress Notes (Signed)
   There were no vitals taken for this visit.   Subjective:    Patient ID: Kenneth Pruitt, male    DOB: 03/03/1951, 68 y.o.   MRN: [id number]  HPI: Kenneth Pruitt is a 68 y.o. male  Med check Discussed with patient all in all doing well no complaints blood pressure cholesterol all doing well. Testosterone discussed testosterone replacement and going to urology for further testosterone injections.  Patient able to self inject for now we will check labs with CBC testosterone BMP and PSA.  Relevant past medical, surgical, family and social history reviewed and updated as indicated. Interim medical history since our last visit reviewed. Allergies and medications reviewed and updated.  Review of Systems  Constitutional: Negative.   Respiratory: Negative.   Cardiovascular: Negative.     Per HPI unless specifically indicated above     Objective:    There were no vitals taken for this visit.  Wt Readings from Last 3 Encounters:  04/30/19 215 lb (97.5 kg)  11/26/18 231 lb 9.6 oz (105.1 kg)  10/08/18 235 lb (106.6 kg)    Physical Exam  Results for orders placed or performed in visit on 03/26/19  CBC with Differential/Platelet  Result Value Ref Range   WBC 7.9 4.0 - 10.5 K/uL   RBC 5.69 4.22 - 5.81 MIL/uL   Hemoglobin 17.0 13.0 - 17.0 g/dL   HCT 50.4 39.0 - 52.0 %   MCV 88.6 80.0 - 100.0 fL   MCH 29.9 26.0 - 34.0 pg   MCHC 33.7 30.0 - 36.0 g/dL   RDW 15.6 (H) 11.5 - 15.5 %   Platelets 205 150 - 400 K/uL   nRBC 0.0 0.0 - 0.2 %   Neutrophils Relative % 72 %   Neutro Abs 5.7 1.7 - 7.7 K/uL   Lymphocytes Relative 18 %   Lymphs Abs 1.4 0.7 - 4.0 K/uL   Monocytes Relative 9 %   Monocytes Absolute 0.7 0.1 - 1.0 K/uL   Eosinophils Relative 1 %   Eosinophils Absolute 0.1 0.0 - 0.5 K/uL   Basophils Relative 0 %   Basophils Absolute 0.0 0.0 - 0.1 K/uL   Immature Granulocytes 0 %   Abs Immature Granulocytes 0.02 0.00 - 0.07 K/uL      Assessment & Plan:   Problem List Items  Addressed This Visit      Cardiovascular and Mediastinum   Essential hypertension - Primary    The current medical regimen is effective;  continue present plan and medications.       Relevant Orders   Basic metabolic panel   CAD (coronary artery disease)    The current medical regimen is effective;  continue present plan and medications.         Endocrine   Hypogonadism in male    The current medical regimen is effective;  continue present plan and medications.       Relevant Orders   Testosterone   Lipid panel   CBC with Differential/Platelet   PSA     Other   Depression    The current medical regimen is effective;  continue present plan and medications.       Hyperlipidemia    The current medical regimen is effective;  continue present plan and medications.           Follow up plan: Return in about 6 months (around 01/05/2020) for Physical Exam.

## 2019-07-13 ENCOUNTER — Other Ambulatory Visit: Payer: Self-pay | Admitting: Family Medicine

## 2019-07-13 NOTE — Telephone Encounter (Signed)
This med, TESTOSTERONE CYPIONATE 200 MG/ML IM need refill protocol.

## 2019-07-15 ENCOUNTER — Other Ambulatory Visit: Payer: Self-pay | Admitting: Family Medicine

## 2019-07-15 DIAGNOSIS — E291 Testicular hypofunction: Secondary | ICD-10-CM

## 2019-08-05 ENCOUNTER — Encounter: Payer: Self-pay | Admitting: Psychiatry

## 2019-08-05 ENCOUNTER — Other Ambulatory Visit: Payer: Self-pay

## 2019-08-05 ENCOUNTER — Ambulatory Visit (INDEPENDENT_AMBULATORY_CARE_PROVIDER_SITE_OTHER): Payer: Medicare Other | Admitting: Psychiatry

## 2019-08-05 DIAGNOSIS — F330 Major depressive disorder, recurrent, mild: Secondary | ICD-10-CM | POA: Insufficient documentation

## 2019-08-05 DIAGNOSIS — F5105 Insomnia due to other mental disorder: Secondary | ICD-10-CM

## 2019-08-05 DIAGNOSIS — F411 Generalized anxiety disorder: Secondary | ICD-10-CM | POA: Diagnosis not present

## 2019-08-05 DIAGNOSIS — F3342 Major depressive disorder, recurrent, in full remission: Secondary | ICD-10-CM | POA: Diagnosis not present

## 2019-08-05 MED ORDER — ESCITALOPRAM OXALATE 10 MG PO TABS: 15.0000 mg | ORAL_TABLET | Freq: Every day | ORAL | 1 refills | Status: DC

## 2019-08-05 MED ORDER — BUPROPION HCL 100 MG PO TABS: 100.0000 mg | ORAL_TABLET | Freq: Every day | ORAL | 1 refills | Status: DC

## 2019-08-05 NOTE — Progress Notes (Signed)
Virtual Visit via Telephone Note  I connected with Kenneth Pruitt on 08/05/19 at 10:30 AM EST by telephone and verified that I am speaking with the correct person using two identifiers.   I discussed the limitations, risks, security and privacy concerns of performing an evaluation and management service by telephone and the availability of in person appointments. I also discussed with the patient that there may be a patient responsible charge related to this service. The patient expressed understanding and agreed to proceed.      I discussed the assessment and treatment plan with the patient. The patient was provided an opportunity to ask questions and all were answered. The patient agreed with the plan and demonstrated an understanding of the instructions.   The patient was advised to call back or seek an in-person evaluation if the symptoms worsen or if the condition fails to improve as anticipated.   [place] MD OP Progress Note  08/05/2019 5:30 PM Kenneth Pruitt  MRN:  [id number]  Chief Complaint:  Chief Complaint    Follow-up     HPI: Kenneth Pruitt is a 67 year old Caucasian male, retired, married, lives in [place], has a history of depression, GAD, insomnia, coronary artery disease, stent placement, knee pain, testosterone deficiency, was evaluated by phone today.  Patient preferred to do a phone call.  Patient today reports that after his visit with writer on 03/03/2019 he developed pain in his back and had another surgery done.  He reports he had surgery of C4-C7, back of his neck.  He reports he had his surgery in September.  He continues to recover.  There are a lot of physical limitations however he is trying to exercise and is currently working with physical therapy.  Patient reports mood wise he is doing okay.  He continues to be compliant on Lexapro and Wellbutrin.  He denies any side effects.  He reports sleep is good.  Patient denies any suicidality, homicidality or perceptual  disturbances.  Patient appeared to be alert, oriented to person place time and situation.  Patient denies any other concerns today. Visit Diagnosis:    ICD-10-CM   1. MDD (major depressive disorder), recurrent, in full remission ([place])  F33.42 buPROPion (WELLBUTRIN) 100 MG tablet  2. GAD (generalized anxiety disorder)  F41.1 escitalopram (LEXAPRO) 10 MG tablet  3. Insomnia due to mental disorder  F51.05     Past Psychiatric History: Reviewed past psychiatric history from my progress note on 12/16/2017  Past Medical History:  Past Medical History:  Diagnosis Date  . Arthritis   . Coronary artery disease   . Depression   . Hyperlipidemia     Past Surgical History:  Procedure Laterality Date  . ANKLE FRACTURE SURGERY Right   . CARDIAC CATHETERIZATION N/A 04/26/2015   Procedure: Left Heart Cath;  Surgeon: Dionisio David, MD;  Location: [facility];  Service: Cardiovascular;  Laterality: N/A;  . CORONARY ANGIOPLASTY    . CORONARY STENT INTERVENTION N/A 03/27/2018   Procedure: CORONARY STENT INTERVENTION;  Surgeon: Yolonda Kida, MD;  Location: [facility];  Service: Cardiovascular;  Laterality: N/A;  . EYE SURGERY Bilateral    cataract  . LEFT HEART CATH AND CORONARY ANGIOGRAPHY Left 03/27/2018   Procedure: Left Heart Cath with possible coronary intervention;  Surgeon: Dionisio David, MD;  Location: [facility];  Service: Cardiovascular;  Laterality: Left;  . TOTAL KNEE ARTHROPLASTY Right 10/08/2018   Procedure: TOTAL KNEE ARTHROPLASTY;  Surgeon: Lovell [name],  MD;  Location: [facility];  Service: Orthopedics;  Laterality: Right;    Family Psychiatric History: Reviewed family psychiatric history from my progress note on 12/16/2017  Family History:  Family History  Problem Relation Age of Onset  . Diabetes Mother   . Hypertension Mother   . Cancer Father   . Depression Father   . Anxiety disorder Father   . Depression Daughter     Social History:  Reviewed social history from my progress note on 12/16/2017 Social History   Socioeconomic History  . Marital status: Married    Spouse name: ann  . Number of children: 2  . Years of education: Not on file  . Highest education level: Master's degree (e.g., MA, MS, MEng, MEd, MSW, MBA)  Occupational History    Comment: retired  Scientific laboratory technician  . Financial resource strain: Not hard at all  . Food insecurity    Worry: Never true    Inability: Never true  . Transportation needs    Medical: No    Non-medical: No  Tobacco Use  . Smoking status: Former Smoker    Years: 30.00    Types: Cigars    Quit date: 07/18/2006    Years since quitting: 13.0  . Smokeless tobacco: Never Used  Substance and Sexual Activity  . Alcohol use: No  . Drug use: No  . Sexual activity: Not Currently  Lifestyle  . Physical activity    Days per week: 0 days    Minutes per session: 0 min  . Stress: Not at all  Relationships  . Social connections    Talks on phone: More than three times a week    Gets together: More than three times a week    Attends religious service: More than 4 times per year    Active member of club or organization: Yes    Attends meetings of clubs or organizations: More than 4 times per year    Relationship status: Married  Other Topics Concern  . Not on file  Social History Narrative  . Not on file    Allergies:  Allergies  Allergen Reactions  . Clonidine Derivatives Shortness Of Breath and Other (See Comments)    Low heart rate    Metabolic Disorder Labs: No results found for: HGBA1C, MPG No results found for: PROLACTIN Lab Results  Component Value Date   CHOL 100 11/26/2018   TRIG 249 (H) 11/26/2018   HDL 29 (L) 11/26/2018   CHOLHDL 3.4 11/26/2018   VLDL 37 03/28/2018   LDLCALC 21 11/26/2018   LDLCALC 45 03/28/2018   Lab Results  Component Value Date   TSH 1.150 11/26/2018   TSH 1.030 02/05/2017    Therapeutic Level Labs: No results found for: LITHIUM No  results found for: VALPROATE No components found for:  CBMZ  Current Medications: Current Outpatient Medications  Medication Sig Dispense Refill  . acetaminophen (TYLENOL) 500 MG tablet Take by mouth.    Marland Kitchen amLODipine (NORVASC) 5 MG tablet Take 1 tablet (5 mg total) by mouth daily. 90 tablet 4  . aspirin EC 81 MG EC tablet Take 1 tablet (81 mg total) by mouth daily. (Patient taking differently: Take 81 mg by mouth at bedtime. )    . baclofen (LIORESAL) 10 MG tablet     . buPROPion (WELLBUTRIN) 100 MG tablet Take 1 tablet (100 mg total) by mouth daily. 90 tablet 1  . Cholecalciferol (CVS VIT D 5000 HIGH-POTENCY) 5000 units capsule Take 5,000 Units by  mouth daily.    . clopidogrel (PLAVIX) 75 MG tablet Take 75 mg by mouth daily.    . DENTA 5000 PLUS 1.1 % CREA dental cream     . dutasteride (AVODART) 0.5 MG capsule Take 1 capsule (0.5 mg total) by mouth every evening. 90 capsule 4  . escitalopram (LEXAPRO) 10 MG tablet Take 1.5 tablets (15 mg total) by mouth at bedtime. 135 tablet 1  . fluticasone (FLONASE) 50 MCG/ACT nasal spray Place 2 sprays into both nostrils daily. 16 g 6  . hydrALAZINE (APRESOLINE) 100 MG tablet Take 50-100 mg by mouth See admin instructions. Take 50 mg by mouth in the morning and take 100 mg by mouth at bedtime    . hydrochlorothiazide (HYDRODIURIL) 25 MG tablet Take 1 tablet (25 mg total) by mouth daily. 90 tablet 4  . hydrocortisone (ANUSOL-HC) 2.5 % rectal cream APPLY RECTALLY TWICE DAILY AS DIRECTED (Patient taking differently: Place 1 application rectally 2 (two) times daily as needed for hemorrhoids or anal itching. ) 30 g 0  . losartan (COZAAR) 100 MG tablet Take 1 tablet (100 mg total) by mouth daily. 90 tablet 4  . meloxicam (MOBIC) 15 MG tablet TAKE ONE TABLET BY MOUTH EVERY DAY 30 tablet 5  . methocarbamol (ROBAXIN) 500 MG tablet Take by mouth.    . niacin (NIASPAN) 1000 MG CR tablet Take 2 tablets (2,000 mg total) by mouth at bedtime. 180 tablet 4  . Omega-3  1000 MG CAPS Take by mouth.    . ondansetron (ZOFRAN-ODT) 4 MG disintegrating tablet     . pantoprazole (PROTONIX) 20 MG tablet Take 20 mg by mouth daily.     Marland Kitchen PREVIDENT 5000 ENAMEL PROTECT 1.1-5 % PSTE     . rosuvastatin (CRESTOR) 20 MG tablet Take 1 tablet (20 mg total) by mouth every evening. 90 tablet 4  . tamsulosin (FLOMAX) 0.4 MG CAPS capsule TAKE 1 CAPSULE BY MOUTH EVERY DAY 90 capsule 2  . testosterone cypionate (DEPOTESTOSTERONE CYPIONATE) 200 MG/ML injection INJECT 0.75MLS INTO THE MUSCLE EVERY 10 DAYS AS DIRECTED 10 mL 1  . vitamin B-12 (CYANOCOBALAMIN) 1000 MCG tablet Take 1,000 mcg by mouth daily.    Marland Kitchen HYDROcodone-acetaminophen (NORCO/VICODIN) 5-325 MG tablet     . HYDROcodone-acetaminophen (NORCO/VICODIN) 5-325 MG tablet Take by mouth.    . senna-docusate (SENOKOT-S) 8.6-50 MG tablet Take by mouth.     No current facility-administered medications for this visit.      Musculoskeletal: Strength & Muscle Tone: UTA Gait & Station: normal Patient leans: N/A  Psychiatric Specialty Exam: Review of Systems  Musculoskeletal: Positive for neck pain.  Psychiatric/Behavioral: Negative for depression, hallucinations, substance abuse and suicidal ideas. The patient is not nervous/anxious and does not have insomnia.   All other systems reviewed and are negative.   There were no vitals taken for this visit.There is no height or weight on file to calculate BMI.  General Appearance: UTA  Eye Contact:  UTA  Speech:  Clear and Coherent  Volume:  Normal  Mood:  Euthymic  Affect:  UTA  Thought Process:  Goal Directed and Descriptions of Associations: Intact  Orientation:  Full (Time, Place, and Person)  Thought Content: Logical   Suicidal Thoughts:  No  Homicidal Thoughts:  No  Memory:  Immediate;   Fair Recent;   Fair Remote;   Fair  Judgement:  Fair  Insight:  Fair  Psychomotor Activity:  UTA  Concentration:  Concentration: Fair and Attention Span: Fair  Recall:  Fair  Fund  of Knowledge: Fair  Language: Fair  Akathisia:  No  Handed:  Right  AIMS (if indicated):Denies tremors,rigidity  Assets:  Communication Skills Desire for Improvement Housing Intimacy Social Support Talents/Skills Transportation  ADL's:  Intact  Cognition: WNL  Sleep:  Fair   Screenings: PHQ2-9     Clinical Support from 04/30/2019 in [facility] Visit from 11/26/2018 in [facility] from 10/11/2017 in [facility] Visit from 03/12/2017 in [facility] Visit from 02/02/2016 in [facility]  PHQ-2 Total Score  0  0  6  1  1   PHQ-9 Total Score  -  0  24  -  -       Assessment and Plan: Perla is a 68 year old Caucasian male, has a history of MDD, anxiety disorder, multiple medical problems including essential hypertension, coronary artery disease status post stent placement, hyperlipidemia, BPH, chronic pain, knee pain, testosterone deficiency was evaluated by phone today.  Patient is currently in remission with regards to his depression and anxiety symptoms.  He will continue to need medication management.  Plan MDD-in remission Continue Wellbutrin 100 mg p.o. daily Lexapro 15 mg p.o. daily-reduced dosage  GAD-stable Lexapro 15 mg p.o. daily Hydroxyzine 25 mg p.o. twice daily as needed for severe anxiety attacks  Insomnia-stable We will continue to monitor closely.  Follow-up in clinic in 4 to 5 months or sooner if needed.  April 15 at 10 AM  I have spent atleast 15 minutes non face to face with patient today. More than 50 % of the time was spent for psychoeducation and supportive psychotherapy and care coordination. This note was generated in part or whole with voice recognition software. Voice recognition is usually quite accurate but there are transcription errors that can and very often do occur. I apologize for any typographical errors that were not detected and  corrected.        Ursula Alert, MD 08/05/2019, 5:30 PM

## 2019-08-18 ENCOUNTER — Other Ambulatory Visit: Payer: Self-pay

## 2019-08-18 DIAGNOSIS — N401 Enlarged prostate with lower urinary tract symptoms: Secondary | ICD-10-CM

## 2019-08-18 DIAGNOSIS — R972 Elevated prostate specific antigen [PSA]: Secondary | ICD-10-CM

## 2019-08-18 MED ORDER — TAMSULOSIN HCL 0.4 MG PO CAPS: 0.4000 mg | ORAL_CAPSULE | Freq: Every day | ORAL | 0 refills | Status: DC

## 2019-08-18 NOTE — Telephone Encounter (Signed)
Needs to come in for future labs ordered 07/07/19 by Dr. Loletha Grayer, will send in 30 day supply but needs labs for further

## 2019-08-19 ENCOUNTER — Other Ambulatory Visit: Payer: Self-pay

## 2019-08-19 NOTE — Telephone Encounter (Signed)
Called and left a detailed message for patient.  

## 2019-08-20 ENCOUNTER — Other Ambulatory Visit: Payer: Medicare Other

## 2019-08-20 ENCOUNTER — Other Ambulatory Visit: Payer: Self-pay

## 2019-08-20 DIAGNOSIS — D751 Secondary polycythemia: Secondary | ICD-10-CM

## 2019-08-20 DIAGNOSIS — E291 Testicular hypofunction: Secondary | ICD-10-CM

## 2019-08-20 DIAGNOSIS — I10 Essential (primary) hypertension: Secondary | ICD-10-CM

## 2019-08-21 ENCOUNTER — Telehealth: Payer: Self-pay | Admitting: Family Medicine

## 2019-08-21 ENCOUNTER — Ambulatory Visit: Payer: Medicare Other | Admitting: Urology

## 2019-08-21 ENCOUNTER — Encounter: Payer: Self-pay | Admitting: Urology

## 2019-08-21 VITALS — BP 146/69 | HR 79 | Ht 72.0 in | Wt 227.0 lb

## 2019-08-21 DIAGNOSIS — N401 Enlarged prostate with lower urinary tract symptoms: Secondary | ICD-10-CM | POA: Diagnosis not present

## 2019-08-21 DIAGNOSIS — E291 Testicular hypofunction: Secondary | ICD-10-CM | POA: Diagnosis not present

## 2019-08-21 LAB — CBC WITH DIFFERENTIAL/PLATELET
Basophils Absolute: 0 10*3/uL (ref 0.0–0.2)
Basos: 1 %
EOS (ABSOLUTE): 0.1 10*3/uL (ref 0.0–0.4)
Eos: 3 %
Hematocrit: 51.8 % — ABNORMAL HIGH (ref 37.5–51.0)
Hemoglobin: 17.8 g/dL — ABNORMAL HIGH (ref 13.0–17.7)
Immature Grans (Abs): 0 10*3/uL (ref 0.0–0.1)
Immature Granulocytes: 0 %
Lymphocytes Absolute: 1.4 10*3/uL (ref 0.7–3.1)
Lymphs: 29 %
MCH: 32.5 pg (ref 26.6–33.0)
MCHC: 34.4 g/dL (ref 31.5–35.7)
MCV: 95 fL (ref 79–97)
Monocytes Absolute: 0.5 10*3/uL (ref 0.1–0.9)
Monocytes: 11 %
Neutrophils Absolute: 2.8 10*3/uL (ref 1.4–7.0)
Neutrophils: 56 %
Platelets: 200 10*3/uL (ref 150–450)
RBC: 5.48 x10E6/uL (ref 4.14–5.80)
RDW: 13.8 % (ref 11.6–15.4)
WBC: 5 10*3/uL (ref 3.4–10.8)

## 2019-08-21 LAB — LIPID PANEL
Chol/HDL Ratio: 2.5 ratio (ref 0.0–5.0)
Cholesterol, Total: 95 mg/dL — ABNORMAL LOW (ref 100–199)
HDL: 38 mg/dL — ABNORMAL LOW (ref >39)
LDL Chol Calc (NIH): 37 mg/dL (ref 0–99)
Triglycerides: 107 mg/dL (ref 0–149)
VLDL Cholesterol Cal: 20 mg/dL (ref 5–40)

## 2019-08-21 LAB — BASIC METABOLIC PANEL
BUN/Creatinine Ratio: 13 (ref 10–24)
BUN: 13 mg/dL (ref 8–27)
CO2: 24 mmol/L (ref 20–29)
Calcium: 8.9 mg/dL (ref 8.6–10.2)
Chloride: 102 mmol/L (ref 96–106)
Creatinine, Ser: 0.99 mg/dL (ref 0.76–1.27)
GFR calc Af Amer: 90 mL/min/{1.73_m2} (ref >59)
GFR calc non Af Amer: 78 mL/min/{1.73_m2} (ref >59)
Glucose: 96 mg/dL (ref 65–99)
Potassium: 4 mmol/L (ref 3.5–5.2)
Sodium: 141 mmol/L (ref 134–144)

## 2019-08-21 LAB — PSA: Prostate Specific Ag, Serum: 1.7 ng/mL (ref 0.0–4.0)

## 2019-08-21 LAB — TESTOSTERONE: Testosterone: 1295 ng/dL — ABNORMAL HIGH (ref 264–916)

## 2019-08-21 MED ORDER — TESTOSTERONE CYPIONATE 200 MG/ML IM SOLN: INTRAMUSCULAR | 0 refills | Status: DC

## 2019-08-21 NOTE — Telephone Encounter (Signed)
Patient notified

## 2019-08-21 NOTE — Progress Notes (Signed)
08/21/2019 1:53 PM   Kenneth Pruitt [birth year] ED:[id number]  Referring provider: Guadalupe Maple, MD [address] [place],  [place] [address]  Chief Complaint  Patient presents with  . Hypogonadism    HPI: 68 y.o. male who has been on TRT for approximately 18 years.  He has most recently been followed by Dr. Jeananne Rama who recently retired and he presents to establish local urologic care.  He has been followed here but not since 2017 and was also seen by urology at [facility].  He is currently injecting 175 mg testosterone every 10 days which has worked well.  He initially started TRT for depression which significantly improved on replacement.  He presently has good energy level.  He has BPH and is on tamsulosin and dutasteride however does not have bothersome lower urinary tract symptoms.  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  Lab work performed yesterday remarkable for a testosterone level 1295, hematocrit 51.8 and stable PSA at 1.7 (uncorrected).  PMH: Past Medical History:  Diagnosis Date  . Arthritis   . Coronary artery disease   . Depression   . Hyperlipidemia     Surgical History: Past Surgical History:  Procedure Laterality Date  . ANKLE FRACTURE SURGERY Right   . CARDIAC CATHETERIZATION N/A 04/26/2015   Procedure: Left Heart Cath;  Surgeon: Dionisio David, MD;  Location: [facility];  Service: Cardiovascular;  Laterality: N/A;  . CORONARY ANGIOPLASTY    . CORONARY STENT INTERVENTION N/A 03/27/2018   Procedure: CORONARY STENT INTERVENTION;  Surgeon: Yolonda Kida, MD;  Location: [facility];  Service: Cardiovascular;  Laterality: N/A;  . EYE SURGERY Bilateral    cataract  . LEFT HEART CATH AND CORONARY ANGIOGRAPHY Left 03/27/2018   Procedure: Left Heart Cath with possible coronary intervention;  Surgeon: Dionisio David, MD;  Location: [facility];  Service: Cardiovascular;  Laterality: Left;  . TOTAL KNEE ARTHROPLASTY Right  10/08/2018   Procedure: TOTAL KNEE ARTHROPLASTY;  Surgeon: Lovell [name], MD;  Location: [facility];  Service: Orthopedics;  Laterality: Right;    Home Medications:  Allergies as of 08/21/2019      Reactions   Clonidine Derivatives Shortness Of Breath, Other (See Comments)   Low heart rate      Medication List       Accurate as of August 21, 2019  1:53 PM. If you have any questions, ask your nurse or doctor.        acetaminophen 500 MG tablet Commonly known as: TYLENOL Take by mouth.   amLODipine 5 MG tablet Commonly known as: NORVASC Take 1 tablet (5 mg total) by mouth daily.   aspirin 81 MG EC tablet Take 1 tablet (81 mg total) by mouth daily. What changed: when to take this   baclofen 10 MG tablet Commonly known as: LIORESAL   buPROPion 100 MG tablet Commonly known as: WELLBUTRIN Take 1 tablet (100 mg total) by mouth daily.   clopidogrel 75 MG tablet Commonly known as: PLAVIX Take 75 mg by mouth daily.   CVS Vit D 5000 High-Potency 125 MCG (5000 UT) capsule Generic drug: Cholecalciferol Take 5,000 Units by mouth daily.   Denta 5000 Plus 1.1 % Crea dental cream Generic drug: sodium fluoride   dutasteride 0.5 MG capsule Commonly known as: AVODART Take 1 capsule (0.5 mg total) by mouth every evening.   escitalopram 10 MG tablet Commonly known as: Lexapro Take 1.5 tablets (15 mg total)  by mouth at bedtime.   fluticasone 50 MCG/ACT nasal spray Commonly known as: FLONASE Place 2 sprays into both nostrils daily.   gabapentin 300 MG capsule Commonly known as: NEURONTIN Take 300 mg by mouth 3 (three) times daily.   hydrALAZINE 100 MG tablet Commonly known as: APRESOLINE Take 50-100 mg by mouth See admin instructions. Take 50 mg by mouth in the morning and take 100 mg by mouth at bedtime   hydrochlorothiazide 25 MG tablet Commonly known as: HYDRODIURIL Take 1 tablet (25 mg total) by mouth daily.   HYDROcodone-acetaminophen 5-325 MG tablet Commonly  known as: NORCO/VICODIN   HYDROcodone-acetaminophen 5-325 MG tablet Commonly known as: NORCO/VICODIN Take by mouth.   hydrocortisone 2.5 % rectal cream Commonly known as: ANUSOL-HC APPLY RECTALLY TWICE DAILY AS DIRECTED What changed: See the new instructions.   losartan 100 MG tablet Commonly known as: COZAAR Take 1 tablet (100 mg total) by mouth daily.   meloxicam 15 MG tablet Commonly known as: MOBIC TAKE ONE TABLET BY MOUTH EVERY DAY   methocarbamol 500 MG tablet Commonly known as: ROBAXIN Take by mouth.   niacin 1000 MG CR tablet Commonly known as: NIASPAN Take 2 tablets (2,000 mg total) by mouth at bedtime.   Omega-3 1000 MG Caps Take by mouth.   ondansetron 4 MG disintegrating tablet Commonly known as: ZOFRAN-ODT   pantoprazole 20 MG tablet Commonly known as: PROTONIX Take 20 mg by mouth daily.   PreviDent 5000 Enamel Protect 1.1-5 % Pste Generic drug: Sod Fluoride-Potassium Nitrate   rosuvastatin 20 MG tablet Commonly known as: CRESTOR Take 1 tablet (20 mg total) by mouth every evening.   senna-docusate 8.6-50 MG tablet Commonly known as: Senokot-S Take by mouth.   tamsulosin 0.4 MG Caps capsule Commonly known as: FLOMAX Take 1 capsule (0.4 mg total) by mouth daily.   testosterone cypionate 200 MG/ML injection Commonly known as: DEPOTESTOSTERONE CYPIONATE INJECT 0.75MLS INTO THE MUSCLE EVERY 10 DAYS AS DIRECTED   vitamin B-12 1000 MCG tablet Commonly known as: CYANOCOBALAMIN Take 1,000 mcg by mouth daily.       Allergies:  Allergies  Allergen Reactions  . Clonidine Derivatives Shortness Of Breath and Other (See Comments)    Low heart rate    Family History: Family History  Problem Relation Age of Onset  . Diabetes Mother   . Hypertension Mother   . Cancer Father   . Depression Father   . Anxiety disorder Father   . Depression Daughter     Social History:  reports that he quit smoking about 13 years ago. His smoking use included  cigars. He quit after 30.00 years of use. He has never used smokeless tobacco. He reports that he does not drink alcohol or use drugs.  ROS: UROLOGY Frequent Urination?: No Hard to postpone urination?: No Burning/pain with urination?: No Get up at night to urinate?: No Leakage of urine?: No Urine stream starts and stops?: No Trouble starting stream?: No Do you have to strain to urinate?: No Blood in urine?: No Urinary tract infection?: No Sexually transmitted disease?: No Injury to kidneys or bladder?: No Painful intercourse?: No Weak stream?: No Erection problems?: No Penile pain?: No  Gastrointestinal Nausea?: No Vomiting?: No Indigestion/heartburn?: No Diarrhea?: No Constipation?: No  Constitutional Fever: No Night sweats?: No Weight loss?: No Fatigue?: No  Skin Skin rash/lesions?: No Itching?: No  Eyes Blurred vision?: No Double vision?: No  Ears/Nose/Throat Sore throat?: No Sinus problems?: No  Hematologic/Lymphatic Swollen glands?: No Easy bruising?: No  Cardiovascular Leg swelling?: No Chest pain?: No  Respiratory Cough?: No Shortness of breath?: No  Endocrine Excessive thirst?: No  Musculoskeletal Back pain?: No Joint pain?: No  Neurological Headaches?: No Dizziness?: No  Psychologic Depression?: No Anxiety?: No  Physical Exam: BP (!) 146/69 (BP Location: Left Arm, Patient Position: Sitting, Cuff Size: Normal)   Pulse 79   Ht 6' (1.829 m)   Wt 227 lb (103 kg)   BMI 30.79 kg/m   Constitutional:  Alert and oriented, No acute distress. HEENT: [place] AT, moist mucus membranes.  Trachea midline, no masses. Cardiovascular: No clubbing, cyanosis, or edema. Respiratory: Normal respiratory effort, no increased work of breathing. GI: Abdomen is soft, nontender, nondistended, no abdominal masses GU: Prostate 35 g, smooth without nodules Lymph: No cervical or inguinal lymphadenopathy. Skin: No rashes, bruises or suspicious lesions.  Neurologic: Grossly intact, no focal deficits, moving all 4 extremities. Psychiatric: Normal mood and affect.  Laboratory Data:  Lab Results  Component Value Date   TESTOSTERONE 1,295 (H) 08/20/2019    Assessment & Plan:    - Hypogonadism Doing well on TRT.  His most recent testosterone level was approximately midcycle.  He will hold his next injection.  Annual follow-up and 40-month lab visit for a testosterone level since his hematocrit is followed by Dr. Grayland Ormond.  Testosterone was refilled.  - BPH with lower urinary tract symptoms Stable voiding symptoms on combination therapy.  Abbie Sons, [place] [address], [place], [place] [address] [phone]

## 2019-08-21 NOTE — Telephone Encounter (Signed)
Please let him know that his labs look good but his testosterone is high. I've sent a copy to his urologist. Thanks.

## 2019-09-18 NOTE — Progress Notes (Deleted)
[place]  [phone] [phone]) [phone]  ID: Kenneth Pruitt OB: 24-Jun-1951  MR#: [id number]  [id number]  Patient Care Team: Guadalupe Maple, MD as PCP - General (Family Medicine) Guadalupe Maple, MD as PCP - Family Medicine (Family Medicine) Brendolyn Patty, MD (Dermatology) Earnestine Leys, MD (Specialist) Dionisio David, MD as Consulting Physician (Cardiology) Troxler, Adele Schilder as Attending Physician (Podiatry)  CHIEF COMPLAINT: Secondary polycythemia.  INTERVAL HISTORY: Patient returns to clinic today for repeat laboratory work and further evaluation.  His appointment was delayed several months secondary to COVID-19.  He had a total knee replacement in January 2020 and is nearly fully recovered from his surgery.  He continues to remain active and recently was able to cycle greater than 65 miles.  He has no neurologic complaints. Patient denies any recent fevers or illnesses. He has a good appetite and denies weight loss.  He denies any chest pain, shortness of breath, cough, or hemoptysis.  He denies any nausea, vomiting, constipation, or diarrhea. He has no urinary complaints.  Patient offers no further specific complaints today.  REVIEW OF SYSTEMS:   Review of Systems  Constitutional: Negative.  Negative for fever, malaise/fatigue and weight loss.  Respiratory: Negative.  Negative for cough and shortness of breath.   Cardiovascular: Negative.  Negative for chest pain and leg swelling.  Gastrointestinal: Negative.  Negative for abdominal pain, blood in stool and melena.  Genitourinary: Negative.  Negative for dysuria.  Musculoskeletal: Negative.  Negative for back pain and joint pain.  Skin: Negative.  Negative for rash.  Neurological: Negative.  Negative for dizziness, sensory change, focal weakness, weakness and headaches.  Psychiatric/Behavioral: Negative.  The patient is not nervous/anxious.     As per HPI. Otherwise, a complete  review of systems is negative.  PAST MEDICAL HISTORY: Past Medical History:  Diagnosis Date  . Arthritis   . Coronary artery disease   . Depression   . Hyperlipidemia     PAST SURGICAL HISTORY: Past Surgical History:  Procedure Laterality Date  . ANKLE FRACTURE SURGERY Right   . CARDIAC CATHETERIZATION N/A 04/26/2015   Procedure: Left Heart Cath;  Surgeon: Dionisio David, MD;  Location: [facility];  Service: Cardiovascular;  Laterality: N/A;  . CORONARY ANGIOPLASTY    . CORONARY STENT INTERVENTION N/A 03/27/2018   Procedure: CORONARY STENT INTERVENTION;  Surgeon: Yolonda Kida, MD;  Location: [facility];  Service: Cardiovascular;  Laterality: N/A;  . EYE SURGERY Bilateral    cataract  . LEFT HEART CATH AND CORONARY ANGIOGRAPHY Left 03/27/2018   Procedure: Left Heart Cath with possible coronary intervention;  Surgeon: Dionisio David, MD;  Location: [facility];  Service: Cardiovascular;  Laterality: Left;  . TOTAL KNEE ARTHROPLASTY Right 10/08/2018   Procedure: TOTAL KNEE ARTHROPLASTY;  Surgeon: Lovell [name], MD;  Location: [facility];  Service: Orthopedics;  Laterality: Right;    FAMILY HISTORY: Family History  Problem Relation Age of Onset  . Diabetes Mother   . Hypertension Mother   . Cancer Father   . Depression Father   . Anxiety disorder Father   . Depression Daughter     ADVANCED DIRECTIVES (Y/N):  N  HEALTH MAINTENANCE: Social History   Tobacco Use  . Smoking status: Former Smoker    Years: 30.00    Types: Cigars    Quit date: 07/18/2006    Years since quitting: 13.1  . Smokeless tobacco: Never Used  Substance Use Topics  .  Alcohol use: No  . Drug use: No     Colonoscopy:  PAP:  Bone density:  Lipid panel:  Allergies  Allergen Reactions  . Clonidine Derivatives Shortness Of Breath and Other (See Comments)    Low heart rate    Current Outpatient Medications  Medication Sig Dispense Refill  . acetaminophen  (TYLENOL) 500 MG tablet Take by mouth.    Marland Kitchen amLODipine (NORVASC) 5 MG tablet Take 1 tablet (5 mg total) by mouth daily. 90 tablet 4  . aspirin EC 81 MG EC tablet Take 1 tablet (81 mg total) by mouth daily. (Patient taking differently: Take 81 mg by mouth at bedtime. )    . baclofen (LIORESAL) 10 MG tablet     . buPROPion (WELLBUTRIN) 100 MG tablet Take 1 tablet (100 mg total) by mouth daily. 90 tablet 1  . Cholecalciferol ([employer] VIT D 5000 HIGH-POTENCY) 5000 units capsule Take 5,000 Units by mouth daily.    . clopidogrel (PLAVIX) 75 MG tablet Take 75 mg by mouth daily.    . DENTA 5000 PLUS 1.1 % CREA dental cream     . dutasteride (AVODART) 0.5 MG capsule Take 1 capsule (0.5 mg total) by mouth every evening. 90 capsule 4  . escitalopram (LEXAPRO) 10 MG tablet Take 1.5 tablets (15 mg total) by mouth at bedtime. 135 tablet 1  . fluticasone (FLONASE) 50 MCG/ACT nasal spray Place 2 sprays into both nostrils daily. 16 g 6  . gabapentin (NEURONTIN) 300 MG capsule Take 300 mg by mouth 3 (three) times daily.    . hydrALAZINE (APRESOLINE) 100 MG tablet Take 50-100 mg by mouth See admin instructions. Take 50 mg by mouth in the morning and take 100 mg by mouth at bedtime    . hydrochlorothiazide (HYDRODIURIL) 25 MG tablet Take 1 tablet (25 mg total) by mouth daily. 90 tablet 4  . HYDROcodone-acetaminophen (NORCO/VICODIN) 5-325 MG tablet     . HYDROcodone-acetaminophen (NORCO/VICODIN) 5-325 MG tablet Take by mouth.    . hydrocortisone (ANUSOL-HC) 2.5 % rectal cream APPLY RECTALLY TWICE DAILY AS DIRECTED (Patient taking differently: Place 1 application rectally 2 (two) times daily as needed for hemorrhoids or anal itching. ) 30 g 0  . losartan (COZAAR) 100 MG tablet Take 1 tablet (100 mg total) by mouth daily. 90 tablet 4  . meloxicam (MOBIC) 15 MG tablet TAKE ONE TABLET BY MOUTH EVERY DAY 30 tablet 5  . methocarbamol (ROBAXIN) 500 MG tablet Take by mouth.    . niacin (NIASPAN) 1000 MG CR tablet Take 2 tablets  (2,000 mg total) by mouth at bedtime. 180 tablet 4  . Omega-3 1000 MG CAPS Take by mouth.    . ondansetron (ZOFRAN-ODT) 4 MG disintegrating tablet     . pantoprazole (PROTONIX) 20 MG tablet Take 20 mg by mouth daily.     Marland Kitchen PREVIDENT 5000 ENAMEL PROTECT 1.1-5 % PSTE     . rosuvastatin (CRESTOR) 20 MG tablet Take 1 tablet (20 mg total) by mouth every evening. 90 tablet 4  . senna-docusate (SENOKOT-S) 8.6-50 MG tablet Take by mouth.    . tamsulosin (FLOMAX) 0.4 MG CAPS capsule Take 1 capsule (0.4 mg total) by mouth daily. 30 capsule 0  . testosterone cypionate (DEPOTESTOSTERONE CYPIONATE) 200 MG/ML injection INJECT 0.75MLS INTO THE MUSCLE EVERY 10 DAYS AS DIRECTED 10 mL 0  . vitamin B-12 (CYANOCOBALAMIN) 1000 MCG tablet Take 1,000 mcg by mouth daily.     No current facility-administered medications for this visit.  OBJECTIVE: There were no vitals filed for this visit.   There is no height or weight on file to calculate BMI.    ECOG FS:0 - Asymptomatic  General: Well-developed, well-nourished, no acute distress. Eyes: Pink conjunctiva, anicteric sclera. HEENT: Normocephalic, moist mucous membranes. Lungs: Clear to auscultation bilaterally. Heart: Regular rate and rhythm. No rubs, murmurs, or gallops. Abdomen: Soft, nontender, nondistended. No organomegaly noted, normoactive bowel sounds. Musculoskeletal: No edema, cyanosis, or clubbing. Neuro: Alert, answering all questions appropriately. Cranial nerves grossly intact. Skin: No rashes or petechiae noted. Psych: Normal affect.  LAB RESULTS:  Lab Results  Component Value Date   NA 141 08/20/2019   K 4.0 08/20/2019   CL 102 08/20/2019   CO2 24 08/20/2019   GLUCOSE 96 08/20/2019   BUN 13 08/20/2019   CREATININE 0.99 08/20/2019   CALCIUM 8.9 08/20/2019   PROT 7.0 11/26/2018   ALBUMIN 4.2 11/26/2018   AST 22 11/26/2018   ALT 15 11/26/2018   ALKPHOS 63 11/26/2018   BILITOT 0.3 11/26/2018   GFRNONAA 78 08/20/2019   GFRAA 90  08/20/2019    Lab Results  Component Value Date   WBC 5.0 08/20/2019   NEUTROABS 2.8 08/20/2019   HGB 17.8 (H) 08/20/2019   HCT 51.8 (H) 08/20/2019   MCV 95 08/20/2019   PLT 200 08/20/2019     STUDIES: No results found.  ASSESSMENT: Secondary polycythemia  PLAN:    1. 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  Patient's hemoglobin remains stable and unchanged at 17.0.  Previously, it was agreed upon that his goal hemoglobin to be less than 17.0.  Patient does not wish to pursue 500 mL phlebotomy today.  Patient expressed understanding that as long as he requires testosterone, he likely will require periodic phlebotomies.  Return to clinic in 6 months with repeat laboratory work, further evaluation, and consideration of phlebotomy. 2. Low testosterone: Patient reports he takes testosterone injections approximately every 10 days.  Continue to treatment with testosterone per urology. 3.  Hypertension: Continue monitoring and treatment per primary care. 4.  Knee replacement: Continue strengthening and rehab per orthopedics.  Patient expressed understanding and was in agreement with this plan. He also understands that He can call clinic at any time with any questions, concerns, or complaints.    Lloyd Huger, MD   09/18/2019 11:17 AM

## 2019-09-21 ENCOUNTER — Other Ambulatory Visit: Payer: Self-pay | Admitting: Nurse Practitioner

## 2019-09-21 DIAGNOSIS — N401 Enlarged prostate with lower urinary tract symptoms: Secondary | ICD-10-CM

## 2019-09-21 DIAGNOSIS — R972 Elevated prostate specific antigen [PSA]: Secondary | ICD-10-CM

## 2019-09-24 ENCOUNTER — Other Ambulatory Visit: Payer: Self-pay

## 2019-09-24 DIAGNOSIS — D751 Secondary polycythemia: Secondary | ICD-10-CM

## 2019-09-25 ENCOUNTER — Inpatient Hospital Stay: Payer: Medicare PPO

## 2019-09-25 ENCOUNTER — Other Ambulatory Visit: Payer: Self-pay

## 2019-09-25 ENCOUNTER — Inpatient Hospital Stay: Payer: Medicare PPO | Attending: Oncology | Admitting: Oncology

## 2019-09-25 ENCOUNTER — Inpatient Hospital Stay: Payer: Medicare PPO | Admitting: Oncology

## 2019-09-25 ENCOUNTER — Encounter: Payer: Self-pay | Admitting: Oncology

## 2019-09-25 VITALS — BP 143/82 | HR 72 | Resp 19

## 2019-09-25 VITALS — BP 142/71 | HR 79 | Temp 95.9°F | Resp 18 | Wt 227.9 lb

## 2019-09-25 DIAGNOSIS — D751 Secondary polycythemia: Secondary | ICD-10-CM | POA: Diagnosis present

## 2019-09-25 LAB — CBC WITH DIFFERENTIAL/PLATELET
Abs Immature Granulocytes: 0.01 10*3/uL (ref 0.00–0.07)
Basophils Absolute: 0 10*3/uL (ref 0.0–0.1)
Basophils Relative: 1 %
Eosinophils Absolute: 0.2 10*3/uL (ref 0.0–0.5)
Eosinophils Relative: 3 %
HCT: 53.4 % — ABNORMAL HIGH (ref 39.0–52.0)
Hemoglobin: 17.5 g/dL — ABNORMAL HIGH (ref 13.0–17.0)
Immature Granulocytes: 0 %
Lymphocytes Relative: 32 %
Lymphs Abs: 1.5 10*3/uL (ref 0.7–4.0)
MCH: 31.5 pg (ref 26.0–34.0)
MCHC: 32.8 g/dL (ref 30.0–36.0)
MCV: 96 fL (ref 80.0–100.0)
Monocytes Absolute: 0.3 10*3/uL (ref 0.1–1.0)
Monocytes Relative: 7 %
Neutro Abs: 2.6 10*3/uL (ref 1.7–7.7)
Neutrophils Relative %: 57 %
Platelets: 203 10*3/uL (ref 150–400)
RBC: 5.56 MIL/uL (ref 4.22–5.81)
RDW: 11.9 % (ref 11.5–15.5)
WBC: 4.6 10*3/uL (ref 4.0–10.5)
nRBC: 0 % (ref 0.0–0.2)

## 2019-09-25 NOTE — Progress Notes (Signed)
[place]  Telephone:(336) [phone] Fax:(336) [phone]  ID: Kenneth Pruitt OB: [birth year]  MR#: [id number]  [id number]  Patient Care Team: Guadalupe Maple, MD as PCP - General (Family Medicine) Guadalupe Maple, MD as PCP - Family Medicine (Family Medicine) Brendolyn Patty, MD (Dermatology) Earnestine Leys, MD (Specialist) Dionisio David, MD as Consulting Physician (Cardiology) Troxler, Adele Schilder as Attending Physician (Podiatry)  CHIEF COMPLAINT: Secondary polycythemia.  INTERVAL HISTORY: Patient returns to clinic today for repeat laboratory work, routine 79-month evaluation, and consideration of phlebotomy.  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  He still is in recovery, but is much improved.  He is fully recovered from his knee replacement surgery in January 2020.  He has no new neurologic complaints. Patient denies any recent fevers or illnesses. He has a good appetite and denies weight loss.  He denies any chest pain, shortness of breath, cough, or hemoptysis.  He denies any nausea, vomiting, constipation, or diarrhea. He has no urinary complaints.  Patient offers no further specific complaints today.  REVIEW OF SYSTEMS:   Review of Systems  Constitutional: Negative.  Negative for fever, malaise/fatigue and weight loss.  Respiratory: Negative.  Negative for cough and shortness of breath.   Cardiovascular: Negative.  Negative for chest pain and leg swelling.  Gastrointestinal: Negative.  Negative for abdominal pain, blood in stool and melena.  Genitourinary: Negative.  Negative for dysuria.  Musculoskeletal: Negative.  Negative for back pain and joint pain.  Skin: Negative.  Negative for rash.  Neurological: Positive for tingling and sensory change. Negative for dizziness, focal weakness, weakness and headaches.  Psychiatric/Behavioral: Negative.  The patient is not nervous/anxious.     As per HPI. Otherwise, a complete  review of systems is negative.  PAST MEDICAL HISTORY: Past Medical History:  Diagnosis Date  . Arthritis   . Coronary artery disease   . Depression   . Hyperlipidemia     PAST SURGICAL HISTORY: Past Surgical History:  Procedure Laterality Date  . ANKLE FRACTURE SURGERY Right   . CARDIAC CATHETERIZATION N/A 04/26/2015   Procedure: Left Heart Cath;  Surgeon: Dionisio David, MD;  Location: [facility];  Service: Cardiovascular;  Laterality: N/A;  . CORONARY ANGIOPLASTY    . CORONARY STENT INTERVENTION N/A 03/27/2018   Procedure: CORONARY STENT INTERVENTION;  Surgeon: Yolonda Kida, MD;  Location: [facility];  Service: Cardiovascular;  Laterality: N/A;  . EYE SURGERY Bilateral    cataract  . LEFT HEART CATH AND CORONARY ANGIOGRAPHY Left 03/27/2018   Procedure: Left Heart Cath with possible coronary intervention;  Surgeon: Dionisio David, MD;  Location: [facility];  Service: Cardiovascular;  Laterality: Left;  . TOTAL KNEE ARTHROPLASTY Right 10/08/2018   Procedure: TOTAL KNEE ARTHROPLASTY;  Surgeon: Lovell [name], MD;  Location: [facility];  Service: Orthopedics;  Laterality: Right;    FAMILY HISTORY: Family History  Problem Relation Age of Onset  . Diabetes Mother   . Hypertension Mother   . Cancer Father   . Depression Father   . Anxiety disorder Father   . Depression Daughter     ADVANCED DIRECTIVES (Y/N):  N  HEALTH MAINTENANCE: Social History   Tobacco Use  . Smoking status: Former Smoker    Years: 30.00    Types: Cigars    Quit date: 07/18/2006    Years since quitting: 13.1  . Smokeless tobacco: Never Used  Substance Use Topics  . Alcohol  use: No  . Drug use: No     Colonoscopy:  PAP:  Bone density:  Lipid panel:  Allergies  Allergen Reactions  . Clonidine Derivatives Shortness Of Breath and Other (See Comments)    Low heart rate    Current Outpatient Medications  Medication Sig Dispense Refill  . acetaminophen  (TYLENOL) 500 MG tablet Take by mouth.    Marland Kitchen amLODipine (NORVASC) 5 MG tablet Take 1 tablet (5 mg total) by mouth daily. 90 tablet 4  . aspirin EC 81 MG EC tablet Take 1 tablet (81 mg total) by mouth daily. (Patient taking differently: Take 81 mg by mouth at bedtime. )    . buPROPion (WELLBUTRIN) 100 MG tablet Take 1 tablet (100 mg total) by mouth daily. 90 tablet 1  . Cholecalciferol ([employer]S VIT D 5000 HIGH-POTENCY) 5000 units capsule Take 5,000 Units by mouth daily.    . clopidogrel (PLAVIX) 75 MG tablet Take 75 mg by mouth daily.    . DENTA 5000 PLUS 1.1 % CREA dental cream     . dutasteride (AVODART) 0.5 MG capsule Take 1 capsule (0.5 mg total) by mouth every evening. 90 capsule 4  . escitalopram (LEXAPRO) 10 MG tablet Take 1.5 tablets (15 mg total) by mouth at bedtime. 135 tablet 1  . fluticasone (FLONASE) 50 MCG/ACT nasal spray Place 2 sprays into both nostrils daily. 16 g 6  . gabapentin (NEURONTIN) 300 MG capsule Take 300 mg by mouth 3 (three) times daily.    . hydrALAZINE (APRESOLINE) 100 MG tablet Take 50-100 mg by mouth See admin instructions. Take 50 mg by mouth in the morning and take 100 mg by mouth at bedtime    . hydrochlorothiazide (HYDRODIURIL) 25 MG tablet Take 1 tablet (25 mg total) by mouth daily. 90 tablet 4  . losartan (COZAAR) 100 MG tablet Take 1 tablet (100 mg total) by mouth daily. 90 tablet 4  . meloxicam (MOBIC) 15 MG tablet TAKE ONE TABLET BY MOUTH EVERY DAY 30 tablet 5  . methocarbamol (ROBAXIN) 500 MG tablet Take by mouth.    . niacin (NIASPAN) 1000 MG CR tablet Take 2 tablets (2,000 mg total) by mouth at bedtime. 180 tablet 4  . Omega-3 1000 MG CAPS Take by mouth.    . pantoprazole (PROTONIX) 20 MG tablet Take 20 mg by mouth daily.     Marland Kitchen PREVIDENT 5000 ENAMEL PROTECT 1.1-5 % PSTE     . rosuvastatin (CRESTOR) 20 MG tablet Take 1 tablet (20 mg total) by mouth every evening. 90 tablet 4  . senna-docusate (SENOKOT-S) 8.6-50 MG tablet Take by mouth.    . tamsulosin  (FLOMAX) 0.4 MG CAPS capsule TAKE 1 CAPSULE EVERY DAY 30 capsule 0  . testosterone cypionate (DEPOTESTOSTERONE CYPIONATE) 200 MG/ML injection INJECT 0.75MLS INTO THE MUSCLE EVERY 10 DAYS AS DIRECTED 10 mL 0  . vitamin B-12 (CYANOCOBALAMIN) 1000 MCG tablet Take 1,000 mcg by mouth daily.    . hydrocortisone (ANUSOL-HC) 2.5 % rectal cream APPLY RECTALLY TWICE DAILY AS DIRECTED (Patient not taking: No sig reported) 30 g 0  . ondansetron (ZOFRAN-ODT) 4 MG disintegrating tablet      No current facility-administered medications for this visit.    OBJECTIVE: Vitals:   09/25/19 0911  BP: (!) 142/71  Pulse: 79  Resp: 18  Temp: (!) 95.9 F (35.5 C)     Body mass index is 30.91 kg/m.    ECOG FS:0 - Asymptomatic  General: Well-developed, well-nourished, no acute distress. Eyes: Pink conjunctiva,  anicteric sclera. HEENT: Normocephalic, moist mucous membranes. Lungs: No audible wheezing or coughing. Heart: Regular rate and rhythm. Abdomen: Soft, nontender, no obvious distention. Musculoskeletal: No edema, cyanosis, or clubbing. Neuro: Alert, answering all questions appropriately. Cranial nerves grossly intact. Skin: No rashes or petechiae noted. Psych: Normal affect.   LAB RESULTS:  Lab Results  Component Value Date   NA 141 08/20/2019   K 4.0 08/20/2019   CL 102 08/20/2019   CO2 24 08/20/2019   GLUCOSE 96 08/20/2019   BUN 13 08/20/2019   CREATININE 0.99 08/20/2019   CALCIUM 8.9 08/20/2019   PROT 7.0 11/26/2018   ALBUMIN 4.2 11/26/2018   AST 22 11/26/2018   ALT 15 11/26/2018   ALKPHOS 63 11/26/2018   BILITOT 0.3 11/26/2018   GFRNONAA 78 08/20/2019   GFRAA 90 08/20/2019    Lab Results  Component Value Date   WBC 4.6 09/25/2019   NEUTROABS 2.6 09/25/2019   HGB 17.5 (H) 09/25/2019   HCT 53.4 (H) 09/25/2019   MCV 96.0 09/25/2019   PLT 203 09/25/2019     STUDIES: No results found.  ASSESSMENT: Secondary polycythemia  PLAN:    1. 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  Patient has not had phlebotomy in nearly 1 year.  His hemoglobin has trended up to 17.5.  Previously, it was agreed upon the goal hemoglobin will be between 17.0 and 17.5.  Proceed with 500 mL phlebotomy today. Patient expressed understanding that as long as he requires testosterone, he likely will require periodic phlebotomies.  Return to clinic in 6 months with repeat laboratory, further evaluation, and consideration of phlebotomy. 2. Low testosterone: Patient reports he takes testosterone injections approximately every 10 days.  Continue to treatment with testosterone per urology. 3.  Hypertension: Chronic and unchanged.  Continue monitoring and treatment per primary care. 4.  Neck fusion: Continue follow-up with neurosurgery as scheduled.  Patient expressed understanding and was in agreement with this plan. He also understands that He can call clinic at any time with any questions, concerns, or complaints.    Lloyd Huger, MD   09/25/2019 11:12 AM

## 2019-09-25 NOTE — Progress Notes (Signed)
Pt in for follow up, still recovery and stiff from spinal fusion surgery in July.

## 2019-10-17 ENCOUNTER — Other Ambulatory Visit: Payer: Self-pay | Admitting: Nurse Practitioner

## 2019-10-17 DIAGNOSIS — R972 Elevated prostate specific antigen [PSA]: Secondary | ICD-10-CM

## 2019-10-17 DIAGNOSIS — R350 Frequency of micturition: Secondary | ICD-10-CM

## 2019-10-17 DIAGNOSIS — N401 Enlarged prostate with lower urinary tract symptoms: Secondary | ICD-10-CM

## 2019-10-25 ENCOUNTER — Ambulatory Visit: Payer: Medicare PPO

## 2019-11-06 ENCOUNTER — Other Ambulatory Visit: Payer: Self-pay | Admitting: Family Medicine

## 2019-11-06 NOTE — Telephone Encounter (Signed)
Requested medications are due for refill today?  Spoke with patient by phone.  Patient uses med prn.   Patient called pharmacy for new RX this a.m.   Requested medications are on active medication list?  Yes  Last Refill:   08/22/2018 30 grams with 0 refills.    Future visit scheduled? Yes  Notes to Clinic:   Needs new RX.

## 2019-11-06 NOTE — Telephone Encounter (Signed)
LOV 06/2019

## 2019-11-11 ENCOUNTER — Ambulatory Visit: Payer: Medicare PPO

## 2019-11-11 ENCOUNTER — Other Ambulatory Visit: Payer: Self-pay | Admitting: Family Medicine

## 2019-12-31 ENCOUNTER — Ambulatory Visit (INDEPENDENT_AMBULATORY_CARE_PROVIDER_SITE_OTHER): Payer: Medicare PPO | Admitting: Psychiatry

## 2019-12-31 ENCOUNTER — Other Ambulatory Visit: Payer: Self-pay

## 2019-12-31 ENCOUNTER — Encounter: Payer: Self-pay | Admitting: Psychiatry

## 2019-12-31 DIAGNOSIS — F3342 Major depressive disorder, recurrent, in full remission: Secondary | ICD-10-CM | POA: Insufficient documentation

## 2019-12-31 DIAGNOSIS — Z634 Disappearance and death of family member: Secondary | ICD-10-CM | POA: Diagnosis not present

## 2019-12-31 DIAGNOSIS — F411 Generalized anxiety disorder: Secondary | ICD-10-CM

## 2019-12-31 DIAGNOSIS — F5105 Insomnia due to other mental disorder: Secondary | ICD-10-CM

## 2019-12-31 NOTE — Progress Notes (Signed)
Provider Location : [facility] Patient Location : [facility] Visit via Video Note  I connected with Kenneth Pruitt on 12/31/19 at 10:00 AM EDT by a video enabled telemedicine application and verified that I am speaking with the correct person using two identifiers.   I discussed the limitations of evaluation and management by telemedicine and the availability of in person appointments. The patient expressed understanding and agreed to proceed.    I discussed the assessment and treatment plan with the patient. The patient was provided an opportunity to ask questions and all were answered. The patient agreed with the plan and demonstrated an understanding of the instructions.   The patient was advised to call back or seek an in-person evaluation if the symptoms worsen or if the condition fails to improve as anticipated.   [place] MD OP Progress Note  12/31/2019 10:57 AM Kenneth Pruitt  MRN:  [id number]  Chief Complaint:  Chief Complaint    Follow-up     HPI: Kenneth Pruitt is a 69 year old Caucasian male, retired, married, lives in [place], has a history of depression, GAD, insomnia, coronary artery disease, stent placement, knee pain, testosterone deficiency was evaluated by telemedicine today.  Video call was initiated however due to connection problem it had to be changed to a phone call during the session.  Patient was last seen on 08/05/2019.  Patient reports he is currently having some complications to his surgery that was done to his neck in September.  He reports he continues to have back pain as well as burning sensation of his lower extremities.  He was recently started on Lyrica.  He however reports the Lyrica possibly could be making him tired and groggy during the day.  He however wants to give it more time.  Patient however reports mood wise he is doing overall okay.  He does report that he lost his mother who was 30 years old in February.  He however is coping okay with her loss.  He  reports he was able to spend some time with her towards the end of her life.  Patient reports sleep as good.  Patient denies any suicidality, homicidality or perceptual disturbances.  He is compliant on his medications like Lexapro and Wellbutrin and denies side effects.  Denies any other concerns today. Visit Diagnosis:    ICD-10-CM   1. MDD (major depressive disorder), recurrent, in full remission ([place])  F33.42   2. GAD (generalized anxiety disorder)  F41.1   3. Insomnia due to mental disorder  F51.05   4. Bereavement  Z63.4     Past Psychiatric History: I have reviewed past psychiatric history from my progress note on 12/16/2017  Past Medical History:  Past Medical History:  Diagnosis Date  . Arthritis   . Coronary artery disease   . Depression   . Hyperlipidemia     Past Surgical History:  Procedure Laterality Date  . ANKLE FRACTURE SURGERY Right   . CARDIAC CATHETERIZATION N/A 04/26/2015   Procedure: Left Heart Cath;  Surgeon: Dionisio David, MD;  Location: [facility];  Service: Cardiovascular;  Laterality: N/A;  . CORONARY ANGIOPLASTY    . CORONARY STENT INTERVENTION N/A 03/27/2018   Procedure: CORONARY STENT INTERVENTION;  Surgeon: Yolonda Kida, MD;  Location: [facility];  Service: Cardiovascular;  Laterality: N/A;  . EYE SURGERY Bilateral    cataract  . LEFT HEART CATH AND CORONARY ANGIOGRAPHY Left 03/27/2018   Procedure: Left Heart Cath with possible coronary  intervention;  Surgeon: Dionisio David, MD;  Location: [facility];  Service: Cardiovascular;  Laterality: Left;  . TOTAL KNEE ARTHROPLASTY Right 10/08/2018   Procedure: TOTAL KNEE ARTHROPLASTY;  Surgeon: Lovell [name], MD;  Location: [facility];  Service: Orthopedics;  Laterality: Right;    Family Psychiatric History: I have reviewed family psychiatric history from my progress note on 12/16/2017 Family History:  Family History  Problem Relation Age of Onset  . Diabetes Mother    . Hypertension Mother   . Cancer Father   . Depression Father   . Anxiety disorder Father   . Depression Daughter     Social History: I have reviewed social history from my progress note on 12/16/2017 Social History   Socioeconomic History  . Marital status: Married    Spouse name: Kenneth Pruitt  . Number of children: 2  . Years of education: Not on file  . Highest education level: Master's degree (e.g., MA, MS, MEng, MEd, MSW, MBA)  Occupational History    Comment: retired  Tobacco Use  . Smoking status: Former Smoker    Years: 30.00    Types: Cigars    Quit date: 07/18/2006    Years since quitting: 13.4  . Smokeless tobacco: Never Used  Substance and Sexual Activity  . Alcohol use: No  . Drug use: No  . Sexual activity: Not Currently  Other Topics Concern  . Not on file  Social History Narrative  . Not on file   Social Determinants of Health   Financial Resource Strain:   . Difficulty of Paying Living Expenses:   Food Insecurity:   . Worried About Charity fundraiser in the Last Year:   . Arboriculturist in the Last Year:   Transportation Needs:   . Film/video editor (Medical):   Marland Kitchen Lack of Transportation (Non-Medical):   Physical Activity:   . Days of Exercise per Week:   . Minutes of Exercise per Session:   Stress:   . Feeling of Stress :   Social Connections:   . Frequency of Communication with Friends and Family:   . Frequency of Social Gatherings with Friends and Family:   . Attends Religious Services:   . Active Member of Clubs or Organizations:   . Attends Archivist Meetings:   Marland Kitchen Marital Status:     Allergies:  Allergies  Allergen Reactions  . Clonidine Derivatives Shortness Of Breath and Other (See Comments)    Low heart rate    Metabolic Disorder Labs: No results found for: HGBA1C, MPG No results found for: PROLACTIN Lab Results  Component Value Date   CHOL 95 (L) 08/20/2019   TRIG 107 08/20/2019   HDL 38 (L) 08/20/2019   CHOLHDL  2.5 08/20/2019   VLDL 37 03/28/2018   LDLCALC 37 08/20/2019   LDLCALC 21 11/26/2018   Lab Results  Component Value Date   TSH 1.150 11/26/2018   TSH 1.030 02/05/2017    Therapeutic Level Labs: No results found for: LITHIUM No results found for: VALPROATE No components found for:  CBMZ  Current Medications: Current Outpatient Medications  Medication Sig Dispense Refill  . icosapent Ethyl (VASCEPA) 1 g capsule Take by mouth.    . pregabalin (LYRICA) 50 MG capsule Take by mouth.    Marland Kitchen acetaminophen (TYLENOL) 500 MG tablet Take by mouth.    Marland Kitchen amLODipine (NORVASC) 5 MG tablet Take 1 tablet (5 mg total) by mouth daily. 90 tablet 4  .  amoxicillin (AMOXIL) 500 MG capsule     . aspirin EC 81 MG EC tablet Take 1 tablet (81 mg total) by mouth daily. (Patient taking differently: Take 81 mg by mouth at bedtime. )    . buPROPion (WELLBUTRIN) 100 MG tablet Take 1 tablet (100 mg total) by mouth daily. 90 tablet 1  . chlorhexidine (PERIDEX) 0.12 % solution     . Cholecalciferol ([employer] VIT D 5000 HIGH-POTENCY) 5000 units capsule Take 5,000 Units by mouth daily.    . clopidogrel (PLAVIX) 75 MG tablet Take 75 mg by mouth daily.    . DENTA 5000 PLUS 1.1 % CREA dental cream     . diclofenac Sodium (VOLTAREN) 1 % GEL Voltaren 1 % topical gel  APPLY 2 GRAMS TO THE AFFECTED AREA(S) BY TOPICAL ROUTE 4 TIMES PER DAY    . dutasteride (AVODART) 0.5 MG capsule Take 1 capsule (0.5 mg total) by mouth every evening. 90 capsule 4  . escitalopram (LEXAPRO) 10 MG tablet Take 1.5 tablets (15 mg total) by mouth at bedtime. 135 tablet 1  . fluticasone (FLONASE) 50 MCG/ACT nasal spray Place 2 sprays into both nostrils daily. 16 g 6  . gabapentin (NEURONTIN) 300 MG capsule Take 300 mg by mouth 3 (three) times daily.    . hydrALAZINE (APRESOLINE) 100 MG tablet Take 50-100 mg by mouth See admin instructions. Take 50 mg by mouth in the morning and take 100 mg by mouth at bedtime    . hydrochlorothiazide (HYDRODIURIL) 25 MG  tablet Take 1 tablet (25 mg total) by mouth daily. 90 tablet 4  . hydrocortisone (ANUSOL-HC) 2.5 % rectal cream APPLY RECTALLY TWICE DAILY AS DIRECTED 30 g 0  . losartan (COZAAR) 100 MG tablet Take 1 tablet (100 mg total) by mouth daily. 90 tablet 4  . meloxicam (MOBIC) 15 MG tablet TAKE ONE TABLET BY MOUTH EVERY DAY 30 tablet 5  . methocarbamol (ROBAXIN) 500 MG tablet Take by mouth.    . niacin (NIASPAN) 1000 MG CR tablet Take 2 tablets (2,000 mg total) by mouth at bedtime. 180 tablet 4  . Omega-3 1000 MG CAPS Take by mouth.    . ondansetron (ZOFRAN-ODT) 4 MG disintegrating tablet     . pantoprazole (PROTONIX) 20 MG tablet Take 20 mg by mouth daily.     . pregabalin (LYRICA) 50 MG capsule     . PREVIDENT 5000 ENAMEL PROTECT 1.1-5 % PSTE     . rosuvastatin (CRESTOR) 20 MG tablet Take 1 tablet (20 mg total) by mouth every evening. 90 tablet 4  . senna-docusate (SENOKOT-S) 8.6-50 MG tablet Take by mouth.    . tamsulosin (FLOMAX) 0.4 MG CAPS capsule TAKE 1 CAPSULE EVERY DAY 30 capsule 5  . testosterone cypionate (DEPOTESTOSTERONE CYPIONATE) 200 MG/ML injection INJECT 0.75MLS INTO THE MUSCLE EVERY 10 DAYS AS DIRECTED 10 mL 0  . VASCEPA 1 g capsule     . vitamin B-12 (CYANOCOBALAMIN) 1000 MCG tablet Take 1,000 mcg by mouth daily.     No current facility-administered medications for this visit.     Musculoskeletal: Strength & Muscle Tone: UTA Gait & Station: normal Patient leans: N/A  Psychiatric Specialty Exam: Review of Systems  Musculoskeletal: Positive for myalgias.       Knee joint pain, back pain, LE pain, burning  All other systems reviewed and are negative.   There were no vitals taken for this visit.There is no height or weight on file to calculate BMI.  General Appearance: Casual  Eye  Contact:  Fair  Speech:  Clear and Coherent  Volume:  Normal  Mood:  Euthymic  Affect:  Congruent  Thought Process:  Goal Directed and Descriptions of Associations: Intact  Orientation:   Full (Time, Place, and Person)  Thought Content: Logical   Suicidal Thoughts:  No  Homicidal Thoughts:  No  Memory:  Immediate;   Fair Recent;   Fair Remote;   Fair  Judgement:  Fair  Insight:  Fair  Psychomotor Activity:  Normal  Concentration:  Concentration: Fair and Attention Span: Fair  Recall:  [employer] of Knowledge: Fair  Language: Fair  Akathisia:  No  Handed:  Right  AIMS: UTA  Assets:  Communication Skills Desire for Improvement Housing Social Support  ADL's:  Intact  Cognition: WNL  Sleep:  Fair   Screenings: PHQ2-9     Clinical Support from 04/30/2019 in [facility] Visit from 11/26/2018 in [facility] from 10/11/2017 in [facility] Visit from 03/12/2017 in [facility] Visit from 02/02/2016 in [facility]  PHQ-2 Total Score  0  0  6  1  1   PHQ-9 Total Score  --  0  24  --  --       Assessment and Plan: Tremond is a 69 year old Caucasian male, has a history of MDD, anxiety disorder, multiple medical problems including essential hypertension, coronary artery disease status post stent placement, hyperlipidemia, BPH, chronic pain, knee pain, testosterone deficiency was evaluated by telemedicine today.  Patient is currently grieving the loss of his mother as well as has pain problems.  Patient however continues to follow-up with his providers for pain management and is stable on current medication regimen with regards to his mood.  Plan as noted below.  Plan MDD in remission Wellbutrin 100 mg p.o. daily Lexapro 15 mg p.o. daily-reduced dosage.  GAD-stable Lexapro 15 mg p.o. daily Hydroxyzine 25 mg p.o. twice daily as needed for severe anxiety attacks  Insomnia-stable We will monitor closely  Bereavement-improving Provided grief counseling.  Follow-up in clinic in 5 months or sooner if needed.  I have spent atleast 20 minutes non face to face with patient  today. More than 50 % of the time was spent for preparing to see the patient ( e.g., review of test, records ), ordering medications and test ,psychoeducation and supportive psychotherapy and care coordination,as well as documenting clinical information in electronic health record. This note was generated in part or whole with voice recognition software. Voice recognition is usually quite accurate but there are transcription errors that can and very often do occur. I apologize for any typographical errors that were not detected and corrected.     Ursula Alert, MD 12/31/2019, 10:57 AM

## 2020-01-01 ENCOUNTER — Other Ambulatory Visit: Payer: Self-pay

## 2020-01-01 MED ORDER — FLUTICASONE PROPIONATE 50 MCG/ACT NA SUSP: 2.0000 | Freq: Every day | NASAL | 6 refills | Status: DC

## 2020-01-01 NOTE — Telephone Encounter (Signed)
Patient has upcoming appointment 02/12/20

## 2020-01-11 ENCOUNTER — Other Ambulatory Visit: Payer: Self-pay

## 2020-01-11 DIAGNOSIS — E782 Mixed hyperlipidemia: Secondary | ICD-10-CM

## 2020-01-11 DIAGNOSIS — N401 Enlarged prostate with lower urinary tract symptoms: Secondary | ICD-10-CM

## 2020-01-11 NOTE — Telephone Encounter (Signed)
Does he have enough to get to his appointment with me in May?

## 2020-01-11 NOTE — Telephone Encounter (Signed)
Called patient, no answer, left a message asking patient to return my call.

## 2020-01-11 NOTE — Telephone Encounter (Signed)
Patient states he will not have enough medication to last until appointment. He states that he is completely out of medication.

## 2020-01-12 MED ORDER — DUTASTERIDE 0.5 MG PO CAPS: 0.5000 mg | ORAL_CAPSULE | Freq: Every evening | ORAL | 0 refills | Status: DC

## 2020-01-12 MED ORDER — NIACIN ER (ANTIHYPERLIPIDEMIC) 1000 MG PO TBCR: 2000.0000 mg | EXTENDED_RELEASE_TABLET | Freq: Every day | ORAL | 0 refills | Status: DC

## 2020-01-12 NOTE — Telephone Encounter (Signed)
Please refill.

## 2020-01-19 ENCOUNTER — Other Ambulatory Visit: Payer: Self-pay | Admitting: Family Medicine

## 2020-01-19 DIAGNOSIS — E782 Mixed hyperlipidemia: Secondary | ICD-10-CM

## 2020-01-20 NOTE — Telephone Encounter (Signed)
LOV: 07/07/19 with Golden Pop, MD; NOV: 02/12/20 with Park Liter, DO.  Last filled 11/26/18 for 90 day supply with 4 refills by Golden Pop, MD.

## 2020-01-22 ENCOUNTER — Other Ambulatory Visit: Payer: Self-pay

## 2020-01-22 DIAGNOSIS — I10 Essential (primary) hypertension: Secondary | ICD-10-CM

## 2020-01-22 MED ORDER — LOSARTAN POTASSIUM 100 MG PO TABS: 100.0000 mg | ORAL_TABLET | Freq: Every day | ORAL | 0 refills | Status: DC

## 2020-02-01 ENCOUNTER — Other Ambulatory Visit: Payer: Self-pay | Admitting: Family Medicine

## 2020-02-01 DIAGNOSIS — I10 Essential (primary) hypertension: Secondary | ICD-10-CM

## 2020-02-01 MED ORDER — AMLODIPINE BESYLATE 5 MG PO TABS: 5.0000 mg | ORAL_TABLET | Freq: Every day | ORAL | 0 refills | Status: DC

## 2020-02-01 NOTE — Telephone Encounter (Signed)
Routing to provider  

## 2020-02-01 NOTE — Telephone Encounter (Signed)
Requested medication (s) are due for refill today: Yes  Requested medication (s) are on the active medication list: Yes  Last refill:  11/26/18  Future visit scheduled: Yes  Notes to clinic:  Prescription has expired.    Requested Prescriptions  Pending Prescriptions Disp Refills   amLODipine (NORVASC) 5 MG tablet [Pharmacy Med Name: AMLODIPINE BESYLATE 5 MG TAB] 90 tablet 4    Sig: TAKE 1 TABLET BY MOUTH DAILY      Cardiovascular:  Calcium Channel Blockers Failed - 02/01/2020  2:29 PM      Failed - Last BP in normal range    BP Readings from Last 1 Encounters:  09/25/19 (!) 143/82          Failed - Valid encounter within last 6 months    Recent Outpatient Visits           6 months ago Essential hypertension   [facility], Jeannette How, MD   1 year ago Need for pneumococcal vaccination   [facility] Guadalupe Maple, MD   1 year ago Hypokalemia   [facility], Jeannette How, MD   1 year ago Essential hypertension   [place], Jeannette How, MD   1 year ago Essential hypertension   [place], Jeannette How, MD       Future Appointments             In 1 week Johnson, [name], DO [place], [place]   In 6 months [place], [name], [place]

## 2020-02-10 ENCOUNTER — Other Ambulatory Visit: Payer: Self-pay | Admitting: Urology

## 2020-02-12 ENCOUNTER — Telehealth: Payer: Self-pay

## 2020-02-12 ENCOUNTER — Ambulatory Visit (INDEPENDENT_AMBULATORY_CARE_PROVIDER_SITE_OTHER): Payer: Medicare PPO | Admitting: Family Medicine

## 2020-02-12 ENCOUNTER — Other Ambulatory Visit: Payer: Self-pay

## 2020-02-12 ENCOUNTER — Encounter: Payer: Self-pay | Admitting: Family Medicine

## 2020-02-12 VITALS — BP 138/62 | HR 61 | Temp 98.0°F | Ht 72.44 in | Wt 226.2 lb

## 2020-02-12 DIAGNOSIS — Z0000 Encounter for general adult medical examination without abnormal findings: Secondary | ICD-10-CM

## 2020-02-12 DIAGNOSIS — N401 Enlarged prostate with lower urinary tract symptoms: Secondary | ICD-10-CM

## 2020-02-12 DIAGNOSIS — R7309 Other abnormal glucose: Secondary | ICD-10-CM

## 2020-02-12 DIAGNOSIS — I2510 Atherosclerotic heart disease of native coronary artery without angina pectoris: Secondary | ICD-10-CM

## 2020-02-12 DIAGNOSIS — E782 Mixed hyperlipidemia: Secondary | ICD-10-CM

## 2020-02-12 DIAGNOSIS — I200 Unstable angina: Secondary | ICD-10-CM

## 2020-02-12 DIAGNOSIS — R350 Frequency of micturition: Secondary | ICD-10-CM

## 2020-02-12 DIAGNOSIS — I10 Essential (primary) hypertension: Secondary | ICD-10-CM | POA: Diagnosis not present

## 2020-02-12 DIAGNOSIS — E291 Testicular hypofunction: Secondary | ICD-10-CM

## 2020-02-12 DIAGNOSIS — N183 Chronic kidney disease, stage 3 unspecified: Secondary | ICD-10-CM

## 2020-02-12 DIAGNOSIS — F411 Generalized anxiety disorder: Secondary | ICD-10-CM

## 2020-02-12 DIAGNOSIS — F3342 Major depressive disorder, recurrent, in full remission: Secondary | ICD-10-CM

## 2020-02-12 DIAGNOSIS — R972 Elevated prostate specific antigen [PSA]: Secondary | ICD-10-CM

## 2020-02-12 DIAGNOSIS — D751 Secondary polycythemia: Secondary | ICD-10-CM

## 2020-02-12 DIAGNOSIS — Z1211 Encounter for screening for malignant neoplasm of colon: Secondary | ICD-10-CM

## 2020-02-12 LAB — MICROALBUMIN, URINE WAIVED
Creatinine, Urine Waived: 100 mg/dL (ref 10–300)
Microalb, Ur Waived: 30 mg/L — ABNORMAL HIGH (ref 0–19)

## 2020-02-12 LAB — URINALYSIS, ROUTINE W REFLEX MICROSCOPIC
Bilirubin, UA: NEGATIVE
Glucose, UA: NEGATIVE
Leukocytes,UA: NEGATIVE
Nitrite, UA: NEGATIVE
Protein,UA: NEGATIVE
RBC, UA: NEGATIVE
Specific Gravity, UA: 1.015 (ref 1.005–1.030)
Urobilinogen, Ur: 1 mg/dL (ref 0.2–1.0)
pH, UA: 7 (ref 5.0–7.5)

## 2020-02-12 LAB — BAYER DCA HB A1C WAIVED: HB A1C (BAYER DCA - WAIVED): 5.3 % (ref <7.0)

## 2020-02-12 MED ORDER — ROSUVASTATIN CALCIUM 20 MG PO TABS: 20.0000 mg | ORAL_TABLET | Freq: Every evening | ORAL | 1 refills | Status: DC

## 2020-02-12 MED ORDER — AMLODIPINE BESYLATE 5 MG PO TABS: 5.0000 mg | ORAL_TABLET | Freq: Every day | ORAL | 0 refills | Status: DC

## 2020-02-12 MED ORDER — LOSARTAN POTASSIUM 100 MG PO TABS: 100.0000 mg | ORAL_TABLET | Freq: Every day | ORAL | 1 refills | Status: DC

## 2020-02-12 MED ORDER — HYDROCHLOROTHIAZIDE 25 MG PO TABS: 25.0000 mg | ORAL_TABLET | Freq: Every day | ORAL | 1 refills | Status: DC

## 2020-02-12 MED ORDER — CLOTRIMAZOLE-BETAMETHASONE 1-0.05 % EX CREA: 1.0000 "application " | TOPICAL_CREAM | Freq: Two times a day (BID) | CUTANEOUS | 0 refills | Status: AC

## 2020-02-12 MED ORDER — NIACIN ER (ANTIHYPERLIPIDEMIC) 1000 MG PO TBCR: 2000.0000 mg | EXTENDED_RELEASE_TABLET | Freq: Every day | ORAL | 1 refills | Status: DC

## 2020-02-12 MED ORDER — TAMSULOSIN HCL 0.4 MG PO CAPS: 0.4000 mg | ORAL_CAPSULE | Freq: Every day | ORAL | 1 refills | Status: DC

## 2020-02-12 MED ORDER — MELOXICAM 15 MG PO TABS: 15.0000 mg | ORAL_TABLET | Freq: Every day | ORAL | 1 refills | Status: DC

## 2020-02-12 MED ORDER — ASPIRIN 81 MG PO TBEC: 81.0000 mg | DELAYED_RELEASE_TABLET | Freq: Every day | ORAL | 3 refills | Status: DC

## 2020-02-12 MED ORDER — DUTASTERIDE 0.5 MG PO CAPS: 0.5000 mg | ORAL_CAPSULE | Freq: Every evening | ORAL | 1 refills | Status: DC

## 2020-02-12 MED ORDER — FLUTICASONE PROPIONATE 50 MCG/ACT NA SUSP: 2.0000 | Freq: Every day | NASAL | 12 refills | Status: DC

## 2020-02-12 NOTE — Patient Instructions (Signed)
Health Maintenance After Age 69 After age 69, you are at a higher risk for certain long-term diseases and infections as well as injuries from falls. Falls are a major cause of broken bones and head injuries in people who are older than age 69. Getting regular preventive care can help to keep you healthy and well. Preventive care includes getting regular testing and making lifestyle changes as recommended by your health care provider. Talk with your health care provider about:  Which screenings and tests you should have. A screening is a test that checks for a disease when you have no symptoms.  A diet and exercise plan that is right for you. What should I know about screenings and tests to prevent falls? Screening and testing are the best ways to find a health problem early. Early diagnosis and treatment give you the best chance of managing medical conditions that are common after age 69. Certain conditions and lifestyle choices may make you more likely to have a fall. Your health care provider may recommend:  Regular vision checks. Poor vision and conditions such as cataracts can make you more likely to have a fall. If you wear glasses, make sure to get your prescription updated if your vision changes.  Medicine review. Work with your health care provider to regularly review all of the medicines you are taking, including over-the-counter medicines. Ask your health care provider about any side effects that may make you more likely to have a fall. Tell your health care provider if any medicines that you take make you feel dizzy or sleepy.  Osteoporosis screening. Osteoporosis is a condition that causes the bones to get weaker. This can make the bones weak and cause them to break more easily.  Blood pressure screening. Blood pressure changes and medicines to control blood pressure can make you feel dizzy.  Strength and balance checks. Your health care provider may recommend certain tests to check your  strength and balance while standing, walking, or changing positions.  Foot health exam. Foot pain and numbness, as well as not wearing proper footwear, can make you more likely to have a fall.  Depression screening. You may be more likely to have a fall if you have a fear of falling, feel emotionally low, or feel unable to do activities that you used to do.  Alcohol use screening. Using too much alcohol can affect your balance and may make you more likely to have a fall. What actions can I take to lower my risk of falls? General instructions  Talk with your health care provider about your risks for falling. Tell your health care provider if: ? You fall. Be sure to tell your health care provider about all falls, even ones that seem minor. ? You feel dizzy, sleepy, or off-balance.  Take over-the-counter and prescription medicines only as told by your health care provider. These include any supplements.  Eat a healthy diet and maintain a healthy weight. A healthy diet includes low-fat dairy products, low-fat (lean) meats, and fiber from whole grains, beans, and lots of fruits and vegetables. Home safety  Remove any tripping hazards, such as rugs, cords, and clutter.  Install safety equipment such as grab bars in bathrooms and safety rails on stairs.  Keep rooms and walkways well-lit. Activity   Follow a regular exercise program to stay fit. This will help you maintain your balance. Ask your health care provider what types of exercise are appropriate for you.  If you need a cane or   walker, use it as recommended by your health care provider.  Wear supportive shoes that have nonskid soles. Lifestyle  Do not drink alcohol if your health care provider tells you not to drink.  If you drink alcohol, limit how much you have: ? 0-1 drink a day for women. ? 0-2 drinks a day for men.  Be aware of how much alcohol is in your drink. In the [place] one drink equals one typical bottle of beer (12  oz), one-half glass of wine (5 oz), or one shot of hard liquor (1 oz).  Do not use any products that contain nicotine or tobacco, such as cigarettes and e-cigarettes. If you need help quitting, ask your health care provider. Summary  Having a healthy lifestyle and getting preventive care can help to protect your health and wellness after age 69.  Screening and testing are the best way to find a health problem early and help you avoid having a fall. Early diagnosis and treatment give you the best chance for managing medical conditions that are more common for people who are older than age 69.  Falls are a major cause of broken bones and head injuries in people who are older than age 69. Take precautions to prevent a fall at home.  Work with your health care provider to learn what changes you can make to improve your health and wellness and to prevent falls. This information is not intended to replace advice given to you by your health care provider. Make sure you discuss any questions you have with your health care provider. Document Revised: 12/25/2018 Document Reviewed: 07/17/2017 Elsevier Patient Education  2020 Elsevier Inc.  

## 2020-02-12 NOTE — Telephone Encounter (Signed)
Done

## 2020-02-12 NOTE — Telephone Encounter (Signed)
-----   Message from [name], DO sent at 02/12/2020  3:59 PM EDT ----- Cologuard please

## 2020-02-13 LAB — CBC WITH DIFFERENTIAL/PLATELET
Basophils Absolute: 0 10*3/uL (ref 0.0–0.2)
Basos: 1 %
EOS (ABSOLUTE): 0.2 10*3/uL (ref 0.0–0.4)
Eos: 3 %
Hematocrit: 47.2 % (ref 37.5–51.0)
Hemoglobin: 16.4 g/dL (ref 13.0–17.7)
Immature Grans (Abs): 0 10*3/uL (ref 0.0–0.1)
Immature Granulocytes: 0 %
Lymphocytes Absolute: 1.3 10*3/uL (ref 0.7–3.1)
Lymphs: 27 %
MCH: 31.5 pg (ref 26.6–33.0)
MCHC: 34.7 g/dL (ref 31.5–35.7)
MCV: 91 fL (ref 79–97)
Monocytes Absolute: 0.6 10*3/uL (ref 0.1–0.9)
Monocytes: 12 %
Neutrophils Absolute: 2.7 10*3/uL (ref 1.4–7.0)
Neutrophils: 57 %
Platelets: 196 10*3/uL (ref 150–450)
RBC: 5.21 x10E6/uL (ref 4.14–5.80)
RDW: 12.8 % (ref 11.6–15.4)
WBC: 4.8 10*3/uL (ref 3.4–10.8)

## 2020-02-13 LAB — COMPREHENSIVE METABOLIC PANEL
ALT: 18 IU/L (ref 0–44)
AST: 17 IU/L (ref 0–40)
Albumin/Globulin Ratio: 2.5 — ABNORMAL HIGH (ref 1.2–2.2)
Albumin: 4.2 g/dL (ref 3.8–4.8)
Alkaline Phosphatase: 55 IU/L (ref 48–121)
BUN/Creatinine Ratio: 17 (ref 10–24)
BUN: 18 mg/dL (ref 8–27)
Bilirubin Total: 0.4 mg/dL (ref 0.0–1.2)
CO2: 23 mmol/L (ref 20–29)
Calcium: 8.6 mg/dL (ref 8.6–10.2)
Chloride: 105 mmol/L (ref 96–106)
Creatinine, Ser: 1.07 mg/dL (ref 0.76–1.27)
GFR calc Af Amer: 82 mL/min/{1.73_m2} (ref >59)
GFR calc non Af Amer: 71 mL/min/{1.73_m2} (ref >59)
Globulin, Total: 1.7 g/dL (ref 1.5–4.5)
Glucose: 97 mg/dL (ref 65–99)
Potassium: 3.6 mmol/L (ref 3.5–5.2)
Sodium: 141 mmol/L (ref 134–144)
Total Protein: 5.9 g/dL — ABNORMAL LOW (ref 6.0–8.5)

## 2020-02-13 LAB — LIPID PANEL W/O CHOL/HDL RATIO
Cholesterol, Total: 87 mg/dL — ABNORMAL LOW (ref 100–199)
HDL: 37 mg/dL — ABNORMAL LOW (ref >39)
LDL Chol Calc (NIH): 31 mg/dL (ref 0–99)
Triglycerides: 102 mg/dL (ref 0–149)
VLDL Cholesterol Cal: 19 mg/dL (ref 5–40)

## 2020-02-13 LAB — TSH: TSH: 0.877 u[IU]/mL (ref 0.450–4.500)

## 2020-02-13 LAB — PSA: Prostate Specific Ag, Serum: 2.1 ng/mL (ref 0.0–4.0)

## 2020-02-15 ENCOUNTER — Encounter: Payer: Self-pay | Admitting: Family Medicine

## 2020-02-15 NOTE — Assessment & Plan Note (Signed)
Stable. Continue to follow with cardiology. Call with any concerns.  

## 2020-02-15 NOTE — Assessment & Plan Note (Signed)
Rechecking labs today. Await results. Treat as needed. Continue to follow with urology. Call with any concerns.

## 2020-02-15 NOTE — Assessment & Plan Note (Signed)
Rechecking labs today. Await results. Treat as needed.  °

## 2020-02-15 NOTE — Progress Notes (Signed)
BP 138/62 (BP Location: Left Arm, Cuff Size: Normal)   Pulse 61   Temp 98 F (36.7 C) (Oral)   Ht 6' 0.44" (1.84 m)   Wt 226 lb 3.2 oz (102.6 kg)   SpO2 97%   BMI 30.31 kg/m    Subjective:    Patient ID: Kenneth Pruitt, male    DOB: [birth year], 69 y.o.   MRN: [id number]  HPI: Kenneth Pruitt is a 69 y.o. male presenting on 02/12/2020 for comprehensive medical examination. Current medical complaints include:  Still recovering from his neck surgery. Continues to recover. Still having some weakness  HYPERTENSION / HYPERLIPIDEMIA Satisfied with current treatment? yes Duration of hypertension: chronic BP monitoring frequency: not checking BP medication side effects: no Duration of hyperlipidemia: chronic Cholesterol medication side effects: no Cholesterol supplements: none Past cholesterol medications: vacepa, crestor, niacin Medication compliance: excellent compliance Aspirin: yes Recent stressors: no Recurrent headaches: no Visual changes: no Palpitations: no Dyspnea: no Chest pain: no Lower extremity edema: yes Dizzy/lightheaded: no  BPH- follows with Dr. Elenor Quinones, has been doing well.  BPH status: controlled Satisfied with current treatment?: yes Medication side effects: no Medication compliance: excellent compliance Duration: chronic Nocturia: no Urinary frequency:no Incomplete voiding: no Urgency: no Weak urinary stream: no Straining to start stream: no Dysuria: no Onset: gradual Severity: mild  DEPRESSION- follows with psychiatry. Has been doing well.  Mood status: controlled Satisfied with current treatment?: yes Symptom severity: mild  Duration of current treatment : chronic Side effects: no Medication compliance: excellent compliance Depressed mood: no Anxious mood: no Anhedonia: no Significant weight loss or gain: no Insomnia: no  Fatigue: no Feelings of worthlessness or guilt: no Impaired concentration/indecisiveness: no Suicidal  ideations: no Hopelessness: no Crying spells: no Depression screen [facility] 2/9 04/30/2019 11/26/2018 10/11/2017 03/12/2017 02/02/2016  Decreased Interest 0 0 3 0 0  Down, Depressed, Hopeless 0 0 3 1 1   PHQ - 2 Score 0 0 6 1 1   Altered sleeping - 0 2 - -  Tired, decreased energy - 0 3 - -  Change in appetite - 0 3 - -  Feeling bad or failure about yourself  - 0 3 - -  Trouble concentrating - 0 3 - -  Moving slowly or fidgety/restless - 0 2 - -  Suicidal thoughts - 0 2 - -  PHQ-9 Score - 0 24 - -  Difficult doing work/chores - Not difficult at all Somewhat difficult - -    Interim Problems from his last visit: no  Depression Screen done today and results listed below:  Depression screen [facility] 2/9 04/30/2019 11/26/2018 10/11/2017 03/12/2017 02/02/2016  Decreased Interest 0 0 3 0 0  Down, Depressed, Hopeless 0 0 3 1 1   PHQ - 2 Score 0 0 6 1 1   Altered sleeping - 0 2 - -  Tired, decreased energy - 0 3 - -  Change in appetite - 0 3 - -  Feeling bad or failure about yourself  - 0 3 - -  Trouble concentrating - 0 3 - -  Moving slowly or fidgety/restless - 0 2 - -  Suicidal thoughts - 0 2 - -  PHQ-9 Score - 0 24 - -  Difficult doing work/chores - Not difficult at all Somewhat difficult - -    Past Medical History:  Past Medical History:  Diagnosis Date  . Arthritis   . Coronary artery disease   . Depression   . Hyperlipidemia     Surgical History:  Past Surgical History:  Procedure Laterality Date  . ANKLE FRACTURE SURGERY Right   . ARTHRODESIS ANT INTERBODY INC DISCECTOMY, CERVICAL BELOW C2   05/2019  . CARDIAC CATHETERIZATION N/A 04/26/2015   Procedure: Left Heart Cath;  Surgeon: Dionisio David, MD;  Location: [place] [facility];  Service: Cardiovascular;  Laterality: N/A;  . CORONARY ANGIOPLASTY    . CORONARY STENT INTERVENTION N/A 03/27/2018   Procedure: CORONARY STENT INTERVENTION;  Surgeon: Yolonda Kida, MD;  Location: [place] [facility];  Service: Cardiovascular;   Laterality: N/A;  . EYE SURGERY Bilateral    cataract  . LEFT HEART CATH AND CORONARY ANGIOGRAPHY Left 03/27/2018   Procedure: Left Heart Cath with possible coronary intervention;  Surgeon: Dionisio David, MD;  Location: [place] [facility];  Service: Cardiovascular;  Laterality: Left;  . TOTAL KNEE ARTHROPLASTY Right 10/08/2018   Procedure: TOTAL KNEE ARTHROPLASTY;  Surgeon: Lovell [name], MD;  Location: [facility];  Service: Orthopedics;  Laterality: Right;    Medications:  Current Outpatient Medications on File Prior to Visit  Medication Sig  . acetaminophen (TYLENOL) 500 MG tablet Take by mouth.  Marland Kitchen buPROPion (WELLBUTRIN) 100 MG tablet Take 1 tablet (100 mg total) by mouth daily.  . Cholecalciferol ([employer] VIT D 5000 HIGH-POTENCY) 5000 units capsule Take 5,000 Units by mouth daily.  . clopidogrel (PLAVIX) 75 MG tablet Take 75 mg by mouth daily.  . diclofenac Sodium (VOLTAREN) 1 % GEL Voltaren 1 % topical gel  APPLY 2 GRAMS TO THE AFFECTED AREA(S) BY TOPICAL ROUTE 4 TIMES PER DAY  . escitalopram (LEXAPRO) 10 MG tablet Take 1.5 tablets (15 mg total) by mouth at bedtime.  . hydrALAZINE (APRESOLINE) 100 MG tablet Take 50-100 mg by mouth See admin instructions. Take 50 mg by mouth in the morning and take 100 mg by mouth at bedtime  . hydrocortisone (ANUSOL-HC) 2.5 % rectal cream APPLY RECTALLY TWICE DAILY AS DIRECTED  . hydrocortisone 2.5 % cream as needed.  Marland Kitchen icosapent Ethyl (VASCEPA) 1 g capsule Take by mouth.  . methocarbamol (ROBAXIN) 500 MG tablet Take by mouth.  . Omega-3 1000 MG CAPS Take by mouth.  . ondansetron (ZOFRAN-ODT) 4 MG disintegrating tablet   . pantoprazole (PROTONIX) 20 MG tablet Take 20 mg by mouth daily.   . pregabalin (LYRICA) 50 MG capsule   . testosterone cypionate (DEPOTESTOSTERONE CYPIONATE) 200 MG/ML injection INJECT 0.75ML IM EVERY 10 DAYS AS DIRECTED  . VASCEPA 1 g capsule   . vitamin B-12 (CYANOCOBALAMIN) 1000 MCG tablet Take 1,000 mcg by mouth daily.  Marland Kitchen  amoxicillin (AMOXIL) 500 MG capsule    No current facility-administered medications on file prior to visit.    Allergies:  Allergies  Allergen Reactions  . Clonidine Derivatives Shortness Of Breath and Other (See Comments)    Low heart rate    Social History:  Social History   Socioeconomic History  . Marital status: Married    Spouse name: ann  . Number of children: 2  . Years of education: Not on file  . Highest education level: Master's degree (e.g., MA, MS, MEng, MEd, MSW, MBA)  Occupational History    Comment: retired  Tobacco Use  . Smoking status: Former Smoker    Years: 30.00    Types: Cigars    Quit date: 07/18/2006    Years since quitting: 13.5  . Smokeless tobacco: Never Used  Substance and Sexual Activity  . Alcohol use: No  . Drug use: No  .  Sexual activity: Not Currently  Other Topics Concern  . Not on file  Social History Narrative  . Not on file   Social Determinants of Health   Financial Resource Strain:   . Difficulty of Paying Living Expenses:   Food Insecurity:   . Worried About Charity fundraiser in the Last Year:   . Arboriculturist in the Last Year:   Transportation Needs:   . Film/video editor (Medical):   Marland Kitchen Lack of Transportation (Non-Medical):   Physical Activity:   . Days of Exercise per Week:   . Minutes of Exercise per Session:   Stress:   . Feeling of Stress :   Social Connections:   . Frequency of Communication with Friends and Family:   . Frequency of Social Gatherings with Friends and Family:   . Attends Religious Services:   . Active Member of Clubs or Organizations:   . Attends Archivist Meetings:   Marland Kitchen Marital Status:   Intimate Partner Violence:   . Fear of Current or Ex-Partner:   . Emotionally Abused:   Marland Kitchen Physically Abused:   . Sexually Abused:    Social History   Tobacco Use  Smoking Status Former Smoker  . Years: 30.00  . Types: Cigars  . Quit date: 07/18/2006  . Years since quitting: 13.5    Smokeless Tobacco Never Used   Social History   Substance and Sexual Activity  Alcohol Use No    Family History:  Family History  Problem Relation Age of Onset  . Diabetes Mother   . Hypertension Mother   . Cancer Father   . Depression Father   . Anxiety disorder Father   . Depression Daughter     Past medical history, surgical history, medications, allergies, family history and social history reviewed with patient today and changes made to appropriate areas of the chart.   Review of Systems  Constitutional: Negative.   HENT: Negative.   Eyes: Negative.   Respiratory: Negative.   Cardiovascular: Negative.   Gastrointestinal: Negative.   Genitourinary: Negative.   Musculoskeletal: Positive for myalgias and neck pain. Negative for back pain, falls and joint pain.  Skin: Negative.   Neurological: Positive for tingling, sensory change and weakness. Negative for dizziness, tremors, speech change, focal weakness, seizures, loss of consciousness and headaches.  Endo/Heme/Allergies: Negative.   Psychiatric/Behavioral: Negative.     All other ROS negative except what is listed above and in the HPI.      Objective:    BP 138/62 (BP Location: Left Arm, Cuff Size: Normal)   Pulse 61   Temp 98 F (36.7 C) (Oral)   Ht 6' 0.44" (1.84 m)   Wt 226 lb 3.2 oz (102.6 kg)   SpO2 97%   BMI 30.31 kg/m   Wt Readings from Last 3 Encounters:  02/12/20 226 lb 3.2 oz (102.6 kg)  09/25/19 227 lb 14.4 oz (103.4 kg)  08/21/19 227 lb (103 kg)    Physical Exam Vitals and nursing note reviewed.  Constitutional:      General: He is not in acute distress.    Appearance: Normal appearance. He is obese. He is not ill-appearing, toxic-appearing or diaphoretic.  HENT:     Head: Normocephalic and atraumatic.     Right Ear: Tympanic membrane, ear canal and external ear normal. There is no impacted cerumen.     Left Ear: Tympanic membrane, ear canal and external ear normal. There is no impacted  cerumen.  Nose: Nose normal. No congestion or rhinorrhea.     Mouth/Throat:     Mouth: Mucous membranes are moist.     Pharynx: Oropharynx is clear. No oropharyngeal exudate or posterior oropharyngeal erythema.  Eyes:     General: No scleral icterus.       Right eye: No discharge.        Left eye: No discharge.     Extraocular Movements: Extraocular movements intact.     Conjunctiva/sclera: Conjunctivae normal.     Pupils: Pupils are equal, round, and reactive to light.  Neck:     Vascular: No carotid bruit.  Cardiovascular:     Rate and Rhythm: Normal rate and regular rhythm.     Pulses: Normal pulses.     Heart sounds: No murmur. No friction rub. No gallop.   Pulmonary:     Effort: Pulmonary effort is normal. No respiratory distress.     Breath sounds: Normal breath sounds. No stridor. No wheezing, rhonchi or rales.  Chest:     Chest wall: No tenderness.  Abdominal:     General: Abdomen is flat. Bowel sounds are normal. There is no distension.     Palpations: Abdomen is soft. There is no mass.     Tenderness: There is no abdominal tenderness. There is no right CVA tenderness, left CVA tenderness, guarding or rebound.     Hernia: No hernia is present.  Genitourinary:    Comments: Genital exam deferred with shared decision making Musculoskeletal:        General: No swelling, tenderness, deformity or signs of injury.     Cervical back: Normal range of motion and neck supple. No rigidity. No muscular tenderness.     Right lower leg: No edema.     Left lower leg: No edema.  Lymphadenopathy:     Cervical: No cervical adenopathy.  Skin:    General: Skin is warm and dry.     Capillary Refill: Capillary refill takes less than 2 seconds.     Coloration: Skin is not jaundiced or pale.     Findings: No bruising, erythema, lesion or rash.  Neurological:     General: No focal deficit present.     Mental Status: He is alert and oriented to person, place, and time.     Cranial  Nerves: No cranial nerve deficit.     Sensory: No sensory deficit.     Motor: No weakness.     Coordination: Coordination normal.     Gait: Gait normal.     Deep Tendon Reflexes: Reflexes normal.  Psychiatric:        Mood and Affect: Mood normal.        Behavior: Behavior normal.        Thought Content: Thought content normal.        Judgment: Judgment normal.     Results for orders placed or performed in visit on 02/12/20  Bayer DCA Hb A1c Waived  Result Value Ref Range   HB A1C (BAYER DCA - WAIVED) 5.3 <7.0 %  CBC with Differential/Platelet  Result Value Ref Range   WBC 4.8 3.4 - 10.8 x10E3/uL   RBC 5.21 4.14 - 5.80 x10E6/uL   Hemoglobin 16.4 13.0 - 17.7 g/dL   Hematocrit 47.2 37.5 - 51.0 %   MCV 91 79 - 97 fL   MCH 31.5 26.6 - 33.0 pg   MCHC 34.7 31.5 - 35.7 g/dL   RDW 12.8 11.6 - 15.4 %   Platelets 196 150 -  450 x10E3/uL   Neutrophils 57 Not Estab. %   Lymphs 27 Not Estab. %   Monocytes 12 Not Estab. %   Eos 3 Not Estab. %   Basos 1 Not Estab. %   Neutrophils Absolute 2.7 1.4 - 7.0 x10E3/uL   Lymphocytes Absolute 1.3 0.7 - 3.1 x10E3/uL   Monocytes Absolute 0.6 0.1 - 0.9 x10E3/uL   EOS (ABSOLUTE) 0.2 0.0 - 0.4 x10E3/uL   Basophils Absolute 0.0 0.0 - 0.2 x10E3/uL   Immature Granulocytes 0 Not Estab. %   Immature Grans (Abs) 0.0 0.0 - 0.1 x10E3/uL  Comprehensive metabolic panel  Result Value Ref Range   Glucose 97 65 - 99 mg/dL   BUN 18 8 - 27 mg/dL   Creatinine, Ser 1.07 0.76 - 1.27 mg/dL   GFR calc non Af Amer 71 >59 mL/min/1.73   GFR calc Af Amer 82 >59 mL/min/1.73   BUN/Creatinine Ratio 17 10 - 24   Sodium 141 134 - 144 mmol/L   Potassium 3.6 3.5 - 5.2 mmol/L   Chloride 105 96 - 106 mmol/L   CO2 23 20 - 29 mmol/L   Calcium 8.6 8.6 - 10.2 mg/dL   Total Protein 5.9 (L) 6.0 - 8.5 g/dL   Albumin 4.2 3.8 - 4.8 g/dL   Globulin, Total 1.7 1.5 - 4.5 g/dL   Albumin/Globulin Ratio 2.5 (H) 1.2 - 2.2   Bilirubin Total 0.4 0.0 - 1.2 mg/dL   Alkaline Phosphatase 55  48 - 121 IU/L   AST 17 0 - 40 IU/L   ALT 18 0 - 44 IU/L  Lipid Panel w/o Chol/HDL Ratio  Result Value Ref Range   Cholesterol, Total 87 (L) 100 - 199 mg/dL   Triglycerides 102 0 - 149 mg/dL   HDL 37 (L) >39 mg/dL   VLDL Cholesterol Cal 19 5 - 40 mg/dL   LDL Chol Calc (NIH) 31 0 - 99 mg/dL  Microalbumin, Urine Waived  Result Value Ref Range   Microalb, Ur Waived 30 (H) 0 - 19 mg/L   Creatinine, Urine Waived 100 10 - 300 mg/dL   Microalb/Creat Ratio 30-300 (H) <30 mg/g  PSA  Result Value Ref Range   Prostate Specific Ag, Serum 2.1 0.0 - 4.0 ng/mL  TSH  Result Value Ref Range   TSH 0.877 0.450 - 4.500 uIU/mL  Urinalysis, Routine w reflex microscopic  Result Value Ref Range   Specific Gravity, UA 1.015 1.005 - 1.030   pH, UA 7.0 5.0 - 7.5   Color, UA Yellow Yellow   Appearance Ur Clear Clear   Leukocytes,UA Negative Negative   Protein,UA Negative Negative/Trace   Glucose, UA Negative Negative   Ketones, UA Trace (A) Negative   RBC, UA Negative Negative   Bilirubin, UA Negative Negative   Urobilinogen, Ur 1.0 0.2 - 1.0 mg/dL   Nitrite, UA Negative Negative      Assessment & Plan:   Problem List Items Addressed This Visit      Cardiovascular and Mediastinum   Essential hypertension    Under good control on current regimen. Continue current regimen. Continue to monitor. Call with any concerns. Refills given. Labs drawn today.       Relevant Medications   losartan (COZAAR) 100 MG tablet   hydrochlorothiazide (HYDRODIURIL) 25 MG tablet   niacin (NIASPAN) 1000 MG CR tablet   rosuvastatin (CRESTOR) 20 MG tablet   aspirin 81 MG EC tablet   amLODipine (NORVASC) 5 MG tablet   Other Relevant Orders  Comprehensive metabolic panel (Completed)   Microalbumin, Urine Waived (Completed)   TSH (Completed)   Urinalysis, Routine w reflex microscopic (Completed)   Unstable angina (HCC)    No issues at this time. Continue to follow with cardiology. Continue to monitor.        Relevant Medications   losartan (COZAAR) 100 MG tablet   hydrochlorothiazide (HYDRODIURIL) 25 MG tablet   niacin (NIASPAN) 1000 MG CR tablet   rosuvastatin (CRESTOR) 20 MG tablet   aspirin 81 MG EC tablet   amLODipine (NORVASC) 5 MG tablet   Other Relevant Orders   Comprehensive metabolic panel (Completed)   CAD (coronary artery disease)    Stable. Continue to follow with cardiology. Call with any concerns.       Relevant Medications   losartan (COZAAR) 100 MG tablet   hydrochlorothiazide (HYDRODIURIL) 25 MG tablet   niacin (NIASPAN) 1000 MG CR tablet   rosuvastatin (CRESTOR) 20 MG tablet   aspirin 81 MG EC tablet   amLODipine (NORVASC) 5 MG tablet   Other Relevant Orders   Comprehensive metabolic panel (Completed)     Endocrine   Hypogonadism in male    Follows with urology. Continue to monitor. Call with any concerns.         Genitourinary   BPH (benign prostatic hyperplasia)    Rechecking labs today. Await results. Treat as needed. Continue to follow with urology. Call with any concerns.       Relevant Medications   tamsulosin (FLOMAX) 0.4 MG CAPS capsule   dutasteride (AVODART) 0.5 MG capsule   Other Relevant Orders   Comprehensive metabolic panel (Completed)   PSA (Completed)   Urinalysis, Routine w reflex microscopic (Completed)   Chronic kidney disease (CKD), stage III (moderate)    Rechecking labs today. Await results. Treat as needed.       Relevant Orders   Comprehensive metabolic panel (Completed)   Microalbumin, Urine Waived (Completed)     Other   Hyperlipidemia    Under good control on current regimen. Continue current regimen. Continue to monitor. Call with any concerns. Refills given. Labs drawn today.        Relevant Medications   losartan (COZAAR) 100 MG tablet   hydrochlorothiazide (HYDRODIURIL) 25 MG tablet   niacin (NIASPAN) 1000 MG CR tablet   rosuvastatin (CRESTOR) 20 MG tablet   aspirin 81 MG EC tablet   amLODipine (NORVASC) 5 MG  tablet   Other Relevant Orders   Comprehensive metabolic panel (Completed)   Lipid Panel w/o Chol/HDL Ratio (Completed)   Elevated PSA    Rechecking labs today. Await results. Treat as needed.       Relevant Medications   tamsulosin (FLOMAX) 0.4 MG CAPS capsule   Other Relevant Orders   Comprehensive metabolic panel (Completed)   PSA (Completed)   Polycythemia, secondary    Rechecking labs today. Await results. Treat as needed.       Relevant Orders   CBC with Differential/Platelet (Completed)   Comprehensive metabolic panel (Completed)   GAD (generalized anxiety disorder)    Stable. Follows with psychiatry. Continue to follow with psychiatry. Call with any concerns.       Elevated glucose    Rechecking labs today. Await results. Treat as needed.       Relevant Orders   Bayer DCA Hb A1c Waived (Completed)   Comprehensive metabolic panel (Completed)   Urinalysis, Routine w reflex microscopic (Completed)   MDD (major depressive disorder), recurrent, in full remission ([place])  Stable. Follows with psychiatry. Continue to follow with psychiatry. Call with any concerns.       Relevant Orders   Comprehensive metabolic panel (Completed)   TSH (Completed)    Other Visit Diagnoses    Routine general medical examination at a health care facility    -  Primary   Vaccines up to date. Screening labs checked today. Cologuard ordered. Continue diet and exercise. Call with any concerns.    Colon cancer screening       Cologuard ordered today. Call with any concerns. Await results.    Relevant Orders   Cologuard       Discussed aspirin prophylaxis for myocardial infarction prevention and decision was made to continue ASA  LABORATORY TESTING:  Health maintenance labs ordered today as discussed above.   The natural history of prostate cancer and ongoing controversy regarding screening and potential treatment outcomes of prostate cancer has been discussed with the patient. The  meaning of a false positive PSA and a false negative PSA has been discussed. He indicates understanding of the limitations of this screening test and wishes to proceed with screening PSA testing.   IMMUNIZATIONS:   - Tdap: Tetanus vaccination status reviewed: last tetanus booster within 10 years. - Influenza: Up to date - Pneumovax: Up to date - Prevnar: Up to date  SCREENING: - Colonoscopy: cologuard ordered today  Discussed with patient purpose of the colonoscopy is to detect colon cancer at curable precancerous or early stages   PATIENT COUNSELING:    Sexuality: Discussed sexually transmitted diseases, partner selection, use of condoms, avoidance of unintended pregnancy  and contraceptive alternatives.   Advised to avoid cigarette smoking.  I discussed with the patient that most people either abstain from alcohol or drink within safe limits (<=14/week and <=4 drinks/occasion for males, <=7/weeks and <= 3 drinks/occasion for females) and that the risk for alcohol disorders and other health effects rises proportionally with the number of drinks per week and how often a drinker exceeds daily limits.  Discussed cessation/primary prevention of drug use and availability of treatment for abuse.   Diet: Encouraged to adjust caloric intake to maintain  or achieve ideal body weight, to reduce intake of dietary saturated fat and total fat, to limit sodium intake by avoiding high sodium foods and not adding table salt, and to maintain adequate dietary potassium and calcium preferably from fresh fruits, vegetables, and low-fat dairy products.    stressed the importance of regular exercise  Injury prevention: Discussed safety belts, safety helmets, smoke detector, smoking near bedding or upholstery.   Dental health: Discussed importance of regular tooth brushing, flossing, and dental visits.   Follow up plan: NEXT PREVENTATIVE PHYSICAL DUE IN 1 YEAR. Return in about 6 months (around  08/14/2020).

## 2020-02-15 NOTE — Assessment & Plan Note (Signed)
Under good control on current regimen. Continue current regimen. Continue to monitor. Call with any concerns. Refills given. Labs drawn today.   

## 2020-02-15 NOTE — Assessment & Plan Note (Signed)
Stable. Follows with psychiatry. Continue to follow with psychiatry. Call with any concerns.

## 2020-02-15 NOTE — Assessment & Plan Note (Signed)
Follows with urology. Continue to monitor. Call with any concerns.  ?

## 2020-02-15 NOTE — Assessment & Plan Note (Signed)
No issues at this time. Continue to follow with cardiology. Continue to monitor.

## 2020-02-16 ENCOUNTER — Other Ambulatory Visit: Payer: Self-pay

## 2020-02-16 DIAGNOSIS — E291 Testicular hypofunction: Secondary | ICD-10-CM

## 2020-02-17 ENCOUNTER — Other Ambulatory Visit: Payer: Self-pay | Admitting: Urology

## 2020-02-17 ENCOUNTER — Other Ambulatory Visit: Payer: Medicare PPO

## 2020-02-17 ENCOUNTER — Other Ambulatory Visit: Payer: Self-pay

## 2020-02-17 DIAGNOSIS — E291 Testicular hypofunction: Secondary | ICD-10-CM

## 2020-02-18 ENCOUNTER — Other Ambulatory Visit: Payer: Self-pay

## 2020-02-18 ENCOUNTER — Encounter: Payer: Self-pay | Admitting: Urology

## 2020-02-25 LAB — COLOGUARD: Cologuard: NEGATIVE

## 2020-03-03 LAB — COLOGUARD: COLOGUARD: NEGATIVE

## 2020-03-04 ENCOUNTER — Other Ambulatory Visit: Payer: Self-pay | Admitting: Family Medicine

## 2020-03-04 DIAGNOSIS — I10 Essential (primary) hypertension: Secondary | ICD-10-CM

## 2020-03-04 NOTE — Telephone Encounter (Signed)
OV-02/12/20- continue medications as prescribed

## 2020-03-09 ENCOUNTER — Other Ambulatory Visit: Payer: Self-pay | Admitting: Psychiatry

## 2020-03-09 DIAGNOSIS — F411 Generalized anxiety disorder: Secondary | ICD-10-CM

## 2020-03-09 DIAGNOSIS — F3342 Major depressive disorder, recurrent, in full remission: Secondary | ICD-10-CM

## 2020-03-20 NOTE — Progress Notes (Signed)
[place]  [phone] [phone]  ID: [name] OB: [birth year]  MR#: [id number]  DVV#:[id number]  Patient Care Team: Guadalupe Maple, MD as PCP - General (Family Medicine) Guadalupe Maple, MD as PCP - Family Medicine (Family Medicine) Brendolyn Patty, MD (Dermatology) Earnestine Leys, MD (Specialist) Dionisio David, MD as Consulting Physician (Cardiology) Elvina Mattes, Adele Schilder as Attending Physician (Podiatry) Grayland Ormond Kathlene November, MD as Consulting Physician (Oncology)  CHIEF COMPLAINT: Secondary polycythemia.  INTERVAL HISTORY: Patient returns to clinic today for repeat laboratory work and consideration of additional phlebotomy.  He is nearly fully recovered from his neck fusion surgery as well as right knee replacement surgery.  He is now back on his bike full-time.  He has no neurologic complaints.  He denies any recent fevers or illnesses. He has a good appetite and denies weight loss.  He denies any chest pain, shortness of breath, cough, or hemoptysis.  He denies any nausea, vomiting, constipation, or diarrhea. He has no urinary complaints.  Patient offers no further specific complaints today.  REVIEW OF SYSTEMS:   Review of Systems  Constitutional: Negative.  Negative for fever, malaise/fatigue and weight loss.  Respiratory: Negative.  Negative for cough and shortness of breath.   Cardiovascular: Negative.  Negative for chest pain and leg swelling.  Gastrointestinal: Negative.  Negative for abdominal pain, blood in stool and melena.  Genitourinary: Negative.  Negative for dysuria.  Musculoskeletal: Positive for joint pain and neck pain. Negative for back pain.  Skin: Negative.  Negative for rash.  Neurological: Negative for dizziness, tingling, sensory change, focal weakness, weakness and headaches.  Psychiatric/Behavioral: Negative.  The patient is not nervous/anxious.     As per HPI. Otherwise, a complete review of systems is  negative.  PAST MEDICAL HISTORY: Past Medical History:  Diagnosis Date   Arthritis    Coronary artery disease    Depression    Hyperlipidemia     PAST SURGICAL HISTORY: Past Surgical History:  Procedure Laterality Date   ANKLE FRACTURE SURGERY Right    ARTHRODESIS ANT INTERBODY INC DISCECTOMY, CERVICAL BELOW C2   05/2019   CARDIAC CATHETERIZATION N/A 04/26/2015   Procedure: Left Heart Cath;  Surgeon: Dionisio David, MD;  Location: [facility];  Service: Cardiovascular;  Laterality: N/A;   CORONARY ANGIOPLASTY     CORONARY STENT INTERVENTION N/A 03/27/2018   Procedure: CORONARY STENT INTERVENTION;  Surgeon: Yolonda Kida, MD;  Location: [facility];  Service: Cardiovascular;  Laterality: N/A;   EYE SURGERY Bilateral    cataract   LEFT HEART CATH AND CORONARY ANGIOGRAPHY Left 03/27/2018   Procedure: Left Heart Cath with possible coronary intervention;  Surgeon: Dionisio David, MD;  Location: [facility];  Service: Cardiovascular;  Laterality: Left;   TOTAL KNEE ARTHROPLASTY Right 10/08/2018   Procedure: TOTAL KNEE ARTHROPLASTY;  Surgeon: Lovell [name], MD;  Location: [facility];  Service: Orthopedics;  Laterality: Right;    FAMILY HISTORY: Family History  Problem Relation Age of Onset   Diabetes Mother    Hypertension Mother    Cancer Father    Depression Father    Anxiety disorder Father    Depression Daughter     ADVANCED DIRECTIVES (Y/N):  N  HEALTH MAINTENANCE: Social History   Tobacco Use   Smoking status: Former Smoker    Years: 30.00    Types: Cigars    Quit date: 07/18/2006    Years since quitting: 35.6  Smokeless tobacco: Never Used  Vaping Use   Vaping Use: Never used  Substance Use Topics   Alcohol use: No   Drug use: No     Colonoscopy:  PAP:  Bone density:  Lipid panel:  Allergies  Allergen Reactions   Clonidine Derivatives Shortness Of Breath and Other (See Comments)    Low heart rate      Current Outpatient Medications  Medication Sig Dispense Refill   acetaminophen (TYLENOL) 500 MG tablet Take by mouth.     amLODipine (NORVASC) 5 MG tablet TAKE 1 TABLET BY MOUTH DAILY 30 tablet 5   amoxicillin (AMOXIL) 500 MG capsule      aspirin 81 MG EC tablet Take 1 tablet (81 mg total) by mouth daily. 90 tablet 3   buPROPion (WELLBUTRIN) 100 MG tablet TAKE ONE TABLET EVERY DAY 90 tablet 1   Cholecalciferol (CVS VIT D 5000 HIGH-POTENCY) 5000 units capsule Take 5,000 Units by mouth daily.     clopidogrel (PLAVIX) 75 MG tablet Take 75 mg by mouth daily.     clotrimazole-betamethasone (LOTRISONE) cream Apply 1 application topically 2 (two) times daily. 30 g 0   diclofenac Sodium (VOLTAREN) 1 % GEL Voltaren 1 % topical gel  APPLY 2 GRAMS TO THE AFFECTED AREA(S) BY TOPICAL ROUTE 4 TIMES PER DAY     dutasteride (AVODART) 0.5 MG capsule Take 1 capsule (0.5 mg total) by mouth every evening. 90 capsule 1   escitalopram (LEXAPRO) 10 MG tablet TAKE 1 AND 1/2 TABLET AT BEDTIME 135 tablet 1   fluticasone (FLONASE) 50 MCG/ACT nasal spray Place 2 sprays into both nostrils daily. 16 g 12   hydrALAZINE (APRESOLINE) 100 MG tablet Take 50-100 mg by mouth See admin instructions. Take 50 mg by mouth in the morning and take 100 mg by mouth at bedtime     hydrochlorothiazide (HYDRODIURIL) 25 MG tablet Take 1 tablet (25 mg total) by mouth daily. 90 tablet 1   hydrocortisone (ANUSOL-HC) 2.5 % rectal cream APPLY RECTALLY TWICE DAILY AS DIRECTED 30 g 0   hydrocortisone 2.5 % cream as needed.     icosapent Ethyl (VASCEPA) 1 g capsule Take by mouth.     losartan (COZAAR) 100 MG tablet Take 1 tablet (100 mg total) by mouth daily. 90 tablet 1   meloxicam (MOBIC) 15 MG tablet Take 1 tablet (15 mg total) by mouth daily. 90 tablet 1   methocarbamol (ROBAXIN) 500 MG tablet Take by mouth.     niacin (NIASPAN) 1000 MG CR tablet Take 2 tablets (2,000 mg total) by mouth at bedtime. 180 tablet 1    Omega-3 1000 MG CAPS Take by mouth.     ondansetron (ZOFRAN-ODT) 4 MG disintegrating tablet      pantoprazole (PROTONIX) 20 MG tablet Take 20 mg by mouth daily.      pregabalin (LYRICA) 50 MG capsule      rosuvastatin (CRESTOR) 20 MG tablet Take 1 tablet (20 mg total) by mouth every evening. 90 tablet 1   tamsulosin (FLOMAX) 0.4 MG CAPS capsule Take 1 capsule (0.4 mg total) by mouth daily. 90 capsule 1   testosterone cypionate (DEPOTESTOSTERONE CYPIONATE) 200 MG/ML injection INJECT 0.75ML IM EVERY 10 DAYS AS DIRECTED 10 mL 0   VASCEPA 1 g capsule      vitamin B-12 (CYANOCOBALAMIN) 1000 MCG tablet Take 1,000 mcg by mouth daily.     No current facility-administered medications for this visit.    OBJECTIVE: Vitals:   03/24/20 1424  BP: 121/70  Pulse: 68  Resp: 20  Temp: 98.7 F (37.1 C)  SpO2: 95%     Body mass index is 29.27 kg/m.    ECOG FS:0 - Asymptomatic  General: Well-developed, well-nourished, no acute distress. Eyes: Pink conjunctiva, anicteric sclera. HEENT: Normocephalic, moist mucous membranes. Lungs: No audible wheezing or coughing. Heart: Regular rate and rhythm. Abdomen: Soft, nontender, no obvious distention. Musculoskeletal: No edema, cyanosis, or clubbing. Neuro: Alert, answering all questions appropriately. Cranial nerves grossly intact. Skin: No rashes or petechiae noted. Psych: Normal affect.   LAB RESULTS:  Lab Results  Component Value Date   NA 141 02/12/2020   K 3.6 02/12/2020   CL 105 02/12/2020   CO2 23 02/12/2020   GLUCOSE 97 02/12/2020   BUN 18 02/12/2020   CREATININE 1.07 02/12/2020   CALCIUM 8.6 02/12/2020   PROT 5.9 (L) 02/12/2020   ALBUMIN 4.2 02/12/2020   AST 17 02/12/2020   ALT 18 02/12/2020   ALKPHOS 55 02/12/2020   BILITOT 0.4 02/12/2020   GFRNONAA 71 02/12/2020   GFRAA 82 02/12/2020    Lab Results  Component Value Date   WBC 4.7 03/24/2020   NEUTROABS 2.3 03/24/2020   HGB 17.2 (H) 03/24/2020   HCT 48.9 03/24/2020    MCV 89.4 03/24/2020   PLT 197 03/24/2020     STUDIES: No results found.  ASSESSMENT: Secondary polycythemia  PLAN:    1. 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  Patient's last phlebotomy was 6 months ago.  His hemoglobin has trended up to 17.2.  Previously, it was agreed upon the goal hemoglobin will be between 17.0 and 17.5.  Despite this, will proceed with 500 mL phlebotomy today. Patient expressed understanding that as long as he requires testosterone, he likely will require periodic phlebotomies.  Return to clinic in 4 months with repeat laboratory work, further evaluation, and continuation of phlebotomy if needed.   2. Low testosterone: Patient reports he takes testosterone injections approximately every 10 days.  Continue to treatment with testosterone per urology. 3.  Hypertension: Patient's blood pressure is within normal limits today.  Continue monitoring and treatment per primary care.   Patient expressed understanding and was in agreement with this plan. He also understands that He can call clinic at any time with any questions, concerns, or complaints.    Lloyd Huger, MD   03/25/2020 7:17 AM

## 2020-03-23 ENCOUNTER — Other Ambulatory Visit: Payer: Self-pay | Admitting: Emergency Medicine

## 2020-03-23 DIAGNOSIS — D751 Secondary polycythemia: Secondary | ICD-10-CM

## 2020-03-24 ENCOUNTER — Other Ambulatory Visit: Payer: Self-pay

## 2020-03-24 ENCOUNTER — Encounter: Payer: Self-pay | Admitting: Oncology

## 2020-03-24 ENCOUNTER — Inpatient Hospital Stay: Payer: Medicare PPO | Attending: Oncology

## 2020-03-24 ENCOUNTER — Inpatient Hospital Stay: Payer: Medicare PPO

## 2020-03-24 ENCOUNTER — Inpatient Hospital Stay (HOSPITAL_BASED_OUTPATIENT_CLINIC_OR_DEPARTMENT_OTHER): Payer: Medicare PPO | Admitting: Oncology

## 2020-03-24 VITALS — BP 121/70 | HR 68 | Temp 98.7°F | Resp 20 | Wt 218.5 lb

## 2020-03-24 VITALS — BP 132/77 | HR 61 | Resp 18

## 2020-03-24 DIAGNOSIS — D751 Secondary polycythemia: Secondary | ICD-10-CM | POA: Diagnosis not present

## 2020-03-24 LAB — CBC WITH DIFFERENTIAL/PLATELET
Abs Immature Granulocytes: 0.01 10*3/uL (ref 0.00–0.07)
Basophils Absolute: 0.1 10*3/uL (ref 0.0–0.1)
Basophils Relative: 1 %
Eosinophils Absolute: 0.2 10*3/uL (ref 0.0–0.5)
Eosinophils Relative: 3 %
HCT: 48.9 % (ref 39.0–52.0)
Hemoglobin: 17.2 g/dL — ABNORMAL HIGH (ref 13.0–17.0)
Immature Granulocytes: 0 %
Lymphocytes Relative: 36 %
Lymphs Abs: 1.7 10*3/uL (ref 0.7–4.0)
MCH: 31.4 pg (ref 26.0–34.0)
MCHC: 35.2 g/dL (ref 30.0–36.0)
MCV: 89.4 fL (ref 80.0–100.0)
Monocytes Absolute: 0.5 10*3/uL (ref 0.1–1.0)
Monocytes Relative: 11 %
Neutro Abs: 2.3 10*3/uL (ref 1.7–7.7)
Neutrophils Relative %: 49 %
Platelets: 197 10*3/uL (ref 150–400)
RBC: 5.47 MIL/uL (ref 4.22–5.81)
RDW: 13.3 % (ref 11.5–15.5)
WBC: 4.7 10*3/uL (ref 4.0–10.5)
nRBC: 0 % (ref 0.0–0.2)

## 2020-03-24 NOTE — Progress Notes (Signed)
Hemoglobin: 17.2 today. MD, Dr. Grayland Ormond, notified and aware. Per MD order: proceed with scheduled 500 ml phlebotomy today; order is in supportive therapy plan.

## 2020-05-16 ENCOUNTER — Other Ambulatory Visit: Payer: Self-pay | Admitting: Family Medicine

## 2020-05-16 ENCOUNTER — Other Ambulatory Visit: Payer: Self-pay | Admitting: Urology

## 2020-05-18 ENCOUNTER — Other Ambulatory Visit: Payer: Self-pay | Admitting: Family Medicine

## 2020-05-18 ENCOUNTER — Encounter: Payer: Self-pay | Admitting: *Deleted

## 2020-05-18 DIAGNOSIS — E291 Testicular hypofunction: Secondary | ICD-10-CM

## 2020-05-19 ENCOUNTER — Other Ambulatory Visit: Payer: Self-pay

## 2020-05-19 ENCOUNTER — Other Ambulatory Visit: Payer: Medicare PPO

## 2020-05-19 DIAGNOSIS — E291 Testicular hypofunction: Secondary | ICD-10-CM

## 2020-05-20 ENCOUNTER — Telehealth: Payer: Self-pay | Admitting: *Deleted

## 2020-05-20 ENCOUNTER — Other Ambulatory Visit: Payer: Self-pay | Admitting: Urology

## 2020-05-20 LAB — TESTOSTERONE: Testosterone: 746 ng/dL (ref 264–916)

## 2020-05-20 MED ORDER — TESTOSTERONE CYPIONATE 200 MG/ML IM SOLN
INTRAMUSCULAR | 0 refills | Status: DC
Start: 2020-05-20 — End: 2020-08-26

## 2020-05-20 NOTE — Telephone Encounter (Signed)
Left message on vm

## 2020-05-20 NOTE — Telephone Encounter (Signed)
-----   Message from Abbie Sons, MD sent at 05/20/2020 10:52 AM EDT ----- T [place]s good at 746. Testosterone was refilled

## 2020-05-31 ENCOUNTER — Other Ambulatory Visit: Payer: Medicare PPO

## 2020-06-02 ENCOUNTER — Ambulatory Visit: Payer: Medicare Other | Admitting: Urology

## 2020-06-02 ENCOUNTER — Telehealth (INDEPENDENT_AMBULATORY_CARE_PROVIDER_SITE_OTHER): Payer: Medicare PPO | Admitting: Psychiatry

## 2020-06-02 ENCOUNTER — Other Ambulatory Visit: Payer: Self-pay

## 2020-06-02 DIAGNOSIS — F411 Generalized anxiety disorder: Secondary | ICD-10-CM

## 2020-06-02 DIAGNOSIS — F5105 Insomnia due to other mental disorder: Secondary | ICD-10-CM

## 2020-06-02 DIAGNOSIS — F3342 Major depressive disorder, recurrent, in full remission: Secondary | ICD-10-CM

## 2020-06-02 NOTE — Progress Notes (Signed)
[place] . Hence will not be able to complete evaluation today. Will reschedule.

## 2020-06-05 NOTE — Progress Notes (Signed)
06/06/2020 1:41 PM   Kenneth Pruitt Jul 24, 1951 [id number]  Referring provider: Guadalupe Maple, MD No address on file Chief Complaint  Patient presents with  . Hypogonadism    HPI: Kenneth Pruitt is a 69 y.o. 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   -He has been on TRT for ~18 years. -He was currently injecting 175 mg testosterone every 10 days which had worked well. -Most recent PSA 2.1 on 02/12/20. -Testosterone trended down to 746 on 05/19/2020.  -Today he presents with good energy level. -He reports recently undergoing a phlebotomy; followed by hematology -He remains on tamsulosin and dutasteride. -Denies any lower urinary tract symptoms.  -His father had prostate cancer.   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 Hematocrit 48.9 on 03/24/2020.    PMH: Past Medical History:  Diagnosis Date  . Arthritis   . Coronary artery disease   . Depression   . Hyperlipidemia     Surgical History: Past Surgical History:  Procedure Laterality Date  . ANKLE FRACTURE SURGERY Right   . ARTHRODESIS ANT INTERBODY INC DISCECTOMY, CERVICAL BELOW C2   05/2019  . CARDIAC CATHETERIZATION N/A 04/26/2015   Procedure: Left Heart Cath;  Surgeon: Dionisio David, MD;  Location: [facility];  Service: Cardiovascular;  Laterality: N/A;  . CORONARY ANGIOPLASTY    . CORONARY STENT INTERVENTION N/A 03/27/2018   Procedure: CORONARY STENT INTERVENTION;  Surgeon: Yolonda Kida, MD;  Location: [name];  Service: Cardiovascular;  Laterality: N/A;  . EYE SURGERY Bilateral    cataract  . LEFT HEART CATH AND CORONARY ANGIOGRAPHY Left 03/27/2018   Procedure: Left Heart Cath with possible coronary intervention;  Surgeon: Dionisio David, MD;  Location: [facility];  Service: Cardiovascular;  Laterality: Left;  . TOTAL KNEE ARTHROPLASTY Right 10/08/2018   Procedure: TOTAL KNEE ARTHROPLASTY;  Surgeon:  Lovell [name], MD;  Location: [facility];  Service: Orthopedics;  Laterality: Right;    Home Medications:  Allergies as of 06/06/2020      Reactions   Clonidine Derivatives Shortness Of Breath, Other (See Comments)   Low heart rate      Medication List       Accurate as of June 06, 2020  1:41 PM. If you have any questions, ask your nurse or doctor.        acetaminophen 500 MG tablet Commonly known as: TYLENOL Take by mouth.   amLODipine 5 MG tablet Commonly known as: NORVASC TAKE 1 TABLET BY MOUTH DAILY   amoxicillin 500 MG capsule Commonly known as: AMOXIL   aspirin 81 MG EC tablet Take 1 tablet (81 mg total) by mouth daily.   buPROPion 100 MG tablet Commonly known as: WELLBUTRIN TAKE ONE TABLET EVERY DAY   clopidogrel 75 MG tablet Commonly known as: PLAVIX Take 75 mg by mouth daily.   clotrimazole-betamethasone cream Commonly known as: Lotrisone Apply 1 application topically 2 (two) times daily.   CVS Vit D 5000 High-Potency 125 MCG (5000 UT) capsule Generic drug: Cholecalciferol Take 5,000 Units by mouth daily.   dutasteride 0.5 MG capsule Commonly known as: AVODART Take 1 capsule (0.5 mg total) by mouth every evening.   escitalopram 10 MG tablet Commonly known as: LEXAPRO TAKE 1 AND 1/2 TABLET AT BEDTIME   fluticasone 50 MCG/ACT nasal spray Commonly known as: FLONASE Place 2 sprays into both nostrils daily.   hydrALAZINE 100 MG tablet  Commonly known as: APRESOLINE Take 50-100 mg by mouth See admin instructions. Take 50 mg by mouth in the morning and take 100 mg by mouth at bedtime   hydrochlorothiazide 25 MG tablet Commonly known as: HYDRODIURIL Take 1 tablet (25 mg total) by mouth daily.   hydrocortisone 2.5 % cream as needed.   hydrocortisone 2.5 % rectal cream Commonly known as: ANUSOL-HC APPLY RECTALLY TWICE DAILY AS DIRECTED   losartan 100 MG tablet Commonly known as: COZAAR Take 1 tablet (100 mg total) by mouth daily.     meloxicam 15 MG tablet Commonly known as: MOBIC TAKE 1 TABLET BY MOUTH DAILY   methocarbamol 500 MG tablet Commonly known as: ROBAXIN Take by mouth.   niacin 1000 MG CR tablet Commonly known as: NIASPAN Take 2 tablets (2,000 mg total) by mouth at bedtime.   Omega-3 1000 MG Caps Take by mouth.   ondansetron 4 MG disintegrating tablet Commonly known as: ZOFRAN-ODT   pantoprazole 20 MG tablet Commonly known as: PROTONIX Take 20 mg by mouth daily.   pregabalin 50 MG capsule Commonly known as: LYRICA   rosuvastatin 20 MG tablet Commonly known as: CRESTOR Take 1 tablet (20 mg total) by mouth every evening.   tamsulosin 0.4 MG Caps capsule Commonly known as: FLOMAX Take 1 capsule (0.4 mg total) by mouth daily.   testosterone cypionate 200 MG/ML injection Commonly known as: DEPOTESTOSTERONE CYPIONATE INJECT 0.75ML IM EVERY 10 DAYS AS DIRECTED   icosapent Ethyl 1 g capsule Commonly known as: VASCEPA Take by mouth.   Vascepa 1 g capsule Generic drug: icosapent Ethyl   vitamin B-12 1000 MCG tablet Commonly known as: CYANOCOBALAMIN Take 1,000 mcg by mouth daily.   Voltaren 1 % Gel Generic drug: diclofenac Sodium Voltaren 1 % topical gel  APPLY 2 GRAMS TO THE AFFECTED AREA(S) BY TOPICAL ROUTE 4 TIMES PER DAY       Allergies:  Allergies  Allergen Reactions  . Clonidine Derivatives Shortness Of Breath and Other (See Comments)    Low heart rate    Family History: Family History  Problem Relation Age of Onset  . Diabetes Mother   . Hypertension Mother   . Cancer Father   . Depression Father   . Anxiety disorder Father   . Depression Daughter     Social History:  reports that he quit smoking about 13 years ago. His smoking use included cigars. He quit after 30.00 years of use. He has never used smokeless tobacco. He reports that he does not drink alcohol and does not use drugs.   Physical Exam: BP (!) 147/85   Pulse 85   Ht 6' (1.829 m)   Wt 210 lb  (95.3 kg)   BMI 28.48 kg/m   Constitutional:  Alert and oriented, No acute distress. HEENT: [place] AT, moist mucus membranes.  Trachea midline, no masses. Cardiovascular: No clubbing, cyanosis, or edema. Respiratory: Normal respiratory effort, no increased work of breathing. GU: No CVA tenderness, prostate 50 g, smooth without nodules Skin: No rashes, bruises or suspicious lesions. Neurologic: Grossly intact, no focal deficits, moving all 4 extremities. Psychiatric: Normal mood and affect.  Laboratory Data:  Lab Results  Component Value Date   CREATININE 1.07 02/12/2020    Lab Results  Component Value Date   TESTOSTERONE 746 05/19/2020    Lab Results  Component Value Date   HGBA1C 5.3 02/12/2020    Assessment & Plan:    1. Hypogonadism  Stable symptoms on TRT  Testosterone level drawn  today  Hematocrit will be followed in hematology  PSA checked annually by his PCP and was 2.16 Jan 2020  2. BPH with lower urinary tract symptoms  Stable voiding symptoms on combination therapy.   RTC in 1 year for follow up with testosterone; he will get 6 months interim testosterone/hematocrit either here or with PCP/Dr. [place] [address], [place], [place] [address] [phone]  I, Selena Batten, am acting as a scribe for Dr. Nicki Reaper C. Jaskirat Schwieger,  I have reviewed the above documentation for accuracy and completeness, and I agree with the above.   Abbie Sons, MD

## 2020-06-06 ENCOUNTER — Other Ambulatory Visit: Payer: Self-pay

## 2020-06-06 ENCOUNTER — Encounter: Payer: Self-pay | Admitting: Urology

## 2020-06-06 ENCOUNTER — Ambulatory Visit: Payer: Medicare PPO | Admitting: Urology

## 2020-06-06 VITALS — BP 147/85 | HR 85 | Ht 72.0 in | Wt 210.0 lb

## 2020-06-06 DIAGNOSIS — E291 Testicular hypofunction: Secondary | ICD-10-CM

## 2020-06-06 DIAGNOSIS — Z8042 Family history of malignant neoplasm of prostate: Secondary | ICD-10-CM

## 2020-06-06 DIAGNOSIS — N401 Enlarged prostate with lower urinary tract symptoms: Secondary | ICD-10-CM

## 2020-06-07 ENCOUNTER — Other Ambulatory Visit: Payer: Self-pay | Admitting: Psychiatry

## 2020-06-07 DIAGNOSIS — F411 Generalized anxiety disorder: Secondary | ICD-10-CM

## 2020-06-08 ENCOUNTER — Encounter: Payer: Self-pay | Admitting: Urology

## 2020-06-08 DIAGNOSIS — Z8042 Family history of malignant neoplasm of prostate: Secondary | ICD-10-CM | POA: Insufficient documentation

## 2020-07-11 ENCOUNTER — Other Ambulatory Visit: Payer: Self-pay

## 2020-07-11 ENCOUNTER — Telehealth (INDEPENDENT_AMBULATORY_CARE_PROVIDER_SITE_OTHER): Payer: Medicare PPO | Admitting: Psychiatry

## 2020-07-11 ENCOUNTER — Encounter: Payer: Self-pay | Admitting: Psychiatry

## 2020-07-11 DIAGNOSIS — F3342 Major depressive disorder, recurrent, in full remission: Secondary | ICD-10-CM

## 2020-07-11 DIAGNOSIS — F411 Generalized anxiety disorder: Secondary | ICD-10-CM

## 2020-07-11 DIAGNOSIS — F5105 Insomnia due to other mental disorder: Secondary | ICD-10-CM

## 2020-07-11 NOTE — Progress Notes (Signed)
Virtual Visit via Video Note  I connected with Kenneth Pruitt on 07/11/20 at 10:40 AM EDT by a video enabled telemedicine application and verified that I am speaking with the correct person using two identifiers.  Location Provider Location : [facility] Patient Location : Car  Participants: Patient , Provider    I discussed the limitations of evaluation and management by telemedicine and the availability of in person appointments. The patient expressed understanding and agreed to proceed.     I discussed the assessment and treatment plan with the patient. The patient was provided an opportunity to ask questions and all were answered. The patient agreed with the plan and demonstrated an understanding of the instructions.   The patient was advised to call back or seek an in-person evaluation if the symptoms worsen or if the condition fails to improve as anticipated. [facility] MD OP Progress Note  07/11/2020 11:02 AM Kenneth Pruitt  MRN:  [id number]  Chief Complaint:  Chief Complaint    Follow-up     HPI: Kenneth Pruitt is a 69 year old Caucasian male, retired, married, lives in [place], has a history of MDD, GAD, insomnia, coronary artery disease, stent placement, knee pain, question steroid deficiency was evaluated by telemedicine today.  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  He had a back spinal surgery done in September 2020.  He reports he is currently in physical therapy to help with neuropathy pain.  However he is making slow progress slowly.  That can be frustrating at times.  Patient however continues to ride his bike 40 miles at a stretch.  He reports he has slowed down a little bit which can be frustrating for him.  Patient reports mood wise he is doing overall okay on the current medication regimen.  He is not interested in making any changes.  He is compliant on the Lexapro and Wellbutrin.  Patient denies any sleep  problems.  Patient denies any suicidality, homicidality or perceptual disturbances.  Patient denies any other concerns today.  Visit Diagnosis:    ICD-10-CM   1. MDD (major depressive disorder), recurrent, in full remission (Kenneth Pruitt)  F33.42   2. GAD (generalized anxiety disorder)  F41.1   3. Insomnia due to mental disorder  F51.05     Past Psychiatric History: I have reviewed past psychiatric history from my progress note on 12/16/2017  Past Medical History:  Past Medical History:  Diagnosis Date  . Arthritis   . Coronary artery disease   . Depression   . Hyperlipidemia     Past Surgical History:  Procedure Laterality Date  . ANKLE FRACTURE SURGERY Right   . ARTHRODESIS ANT INTERBODY INC DISCECTOMY, CERVICAL BELOW C2   05/2019  . CARDIAC CATHETERIZATION N/A 04/26/2015   Procedure: Left Heart Cath;  Surgeon: Dionisio David, MD;  Location: [facility];  Service: Cardiovascular;  Laterality: N/A;  . CORONARY ANGIOPLASTY    . CORONARY STENT INTERVENTION N/A 03/27/2018   Procedure: CORONARY STENT INTERVENTION;  Surgeon: Yolonda Kida, MD;  Location: [facility];  Service: Cardiovascular;  Laterality: N/A;  . EYE SURGERY Bilateral    cataract  . LEFT HEART CATH AND CORONARY ANGIOGRAPHY Left 03/27/2018   Procedure: Left Heart Cath with possible coronary intervention;  Surgeon: Dionisio David, MD;  Location: [facility];  Service: Cardiovascular;  Laterality: Left;  . TOTAL KNEE ARTHROPLASTY Right 10/08/2018   Procedure: TOTAL KNEE ARTHROPLASTY;  Surgeon:  Lovell [name], MD;  Location: [facility];  Service: Orthopedics;  Laterality: Right;    Family Psychiatric History: I have reviewed family psychiatric history from my progress note on 12/16/2017  Family History:  Family History  Problem Relation Age of Onset  . Diabetes Mother   . Hypertension Mother   . Cancer Father   . Depression Father   . Anxiety disorder Father   . Depression Daughter      Social History: I have reviewed social history from my progress note on 12/16/2017 Social History   Socioeconomic History  . Marital status: Married    Spouse name: Kenneth Pruitt  . Number of children: 2  . Years of education: Not on file  . Highest education level: Master's degree (e.g., MA, MS, MEng, MEd, MSW, MBA)  Occupational History    Comment: retired  Tobacco Use  . Smoking status: Former Smoker    Years: 30.00    Types: Cigars    Quit date: 07/18/2006    Years since quitting: 13.9  . Smokeless tobacco: Never Used  Vaping Use  . Vaping Use: Never used  Substance and Sexual Activity  . Alcohol use: No  . Drug use: No  . Sexual activity: Not Currently  Other Topics Concern  . Not on file  Social History Narrative  . Not on file   Social Determinants of Health   Financial Resource Strain:   . Difficulty of Paying Living Expenses: Not on file  Food Insecurity:   . Worried About Charity fundraiser in the Last Year: Not on file  . Ran Out of Food in the Last Year: Not on file  Transportation Needs:   . Lack of Transportation (Medical): Not on file  . Lack of Transportation (Non-Medical): Not on file  Physical Activity:   . Days of Exercise per Week: Not on file  . Minutes of Exercise per Session: Not on file  Stress:   . Feeling of Stress : Not on file  Social Connections:   . Frequency of Communication with Friends and Family: Not on file  . Frequency of Social Gatherings with Friends and Family: Not on file  . Attends Religious Services: Not on file  . Active Member of Clubs or Organizations: Not on file  . Attends Archivist Meetings: Not on file  . Marital Status: Not on file    Allergies:  Allergies  Allergen Reactions  . Clonidine Derivatives Shortness Of Breath and Other (See Comments)    Low heart rate  . Clonidine     Mild bradycardia + Somnolence    Metabolic Disorder Labs: Lab Results  Component Value Date   HGBA1C 5.3 02/12/2020    No results found for: PROLACTIN Lab Results  Component Value Date   CHOL 87 (L) 02/12/2020   TRIG 102 02/12/2020   HDL 37 (L) 02/12/2020   CHOLHDL 2.5 08/20/2019   VLDL 37 03/28/2018   LDLCALC 31 02/12/2020   LDLCALC 37 08/20/2019   Lab Results  Component Value Date   TSH 0.877 02/12/2020   TSH 1.150 11/26/2018    Therapeutic Level Labs: No results found for: LITHIUM No results found for: VALPROATE No components found for:  CBMZ  Current Medications: Current Outpatient Medications  Medication Sig Dispense Refill  . Niacin CR 1000 MG TBCR Take by mouth.    . tizanidine (ZANAFLEX) 2 MG capsule Take by mouth.    Marland Kitchen acetaminophen (TYLENOL) 500 MG tablet Take by mouth.    Marland Kitchen  amLODipine (NORVASC) 5 MG tablet TAKE 1 TABLET BY MOUTH DAILY 30 tablet 5  . amoxicillin (AMOXIL) 500 MG capsule     . aspirin 81 MG EC tablet Take 1 tablet (81 mg total) by mouth daily. 90 tablet 3  . buPROPion (WELLBUTRIN) 100 MG tablet TAKE ONE TABLET EVERY DAY 90 tablet 1  . chlorthalidone (HYGROTON) 25 MG tablet     . Cholecalciferol ([employer]S VIT D 5000 HIGH-POTENCY) 5000 units capsule Take 5,000 Units by mouth daily.    . clopidogrel (PLAVIX) 75 MG tablet Take 75 mg by mouth daily.    . clotrimazole-betamethasone (LOTRISONE) cream Apply 1 application topically 2 (two) times daily. 30 g 0  . diclofenac Sodium (VOLTAREN) 1 % GEL Voltaren 1 % topical gel  APPLY 2 GRAMS TO THE AFFECTED AREA(S) BY TOPICAL ROUTE 4 TIMES PER DAY    . dutasteride (AVODART) 0.5 MG capsule Take 1 capsule (0.5 mg total) by mouth every evening. 90 capsule 1  . escitalopram (LEXAPRO) 10 MG tablet TAKE 1 AND 1/2 TABLET AT BEDTIME 135 tablet 1  . fluticasone (FLONASE) 50 MCG/ACT nasal spray Place 2 sprays into both nostrils daily. 16 g 12  . hydrALAZINE (APRESOLINE) 100 MG tablet Take 50-100 mg by mouth See admin instructions. Take 50 mg by mouth in the morning and take 100 mg by mouth at bedtime    . hydrochlorothiazide (HYDRODIURIL)  25 MG tablet Take 1 tablet (25 mg total) by mouth daily. 90 tablet 1  . hydrocortisone (ANUSOL-HC) 2.5 % rectal cream APPLY RECTALLY TWICE DAILY AS DIRECTED 30 g 0  . hydrocortisone 2.5 % cream as needed.    Marland Kitchen icosapent Ethyl (VASCEPA) 1 g capsule Take by mouth.    . losartan (COZAAR) 100 MG tablet Take 1 tablet (100 mg total) by mouth daily. 90 tablet 1  . meloxicam (MOBIC) 15 MG tablet TAKE 1 TABLET BY MOUTH DAILY 90 tablet 1  . methocarbamol (ROBAXIN) 500 MG tablet Take by mouth.    . niacin (NIASPAN) 1000 MG CR tablet Take 2 tablets (2,000 mg total) by mouth at bedtime. 180 tablet 1  . Omega-3 1000 MG CAPS Take by mouth.    . ondansetron (ZOFRAN-ODT) 4 MG disintegrating tablet     . pantoprazole (PROTONIX) 20 MG tablet Take 20 mg by mouth daily.     . pregabalin (LYRICA) 75 MG capsule     . rosuvastatin (CRESTOR) 20 MG tablet Take 1 tablet (20 mg total) by mouth every evening. 90 tablet 1  . tamsulosin (FLOMAX) 0.4 MG CAPS capsule Take 1 capsule (0.4 mg total) by mouth daily. 90 capsule 1  . testosterone cypionate (DEPOTESTOSTERONE CYPIONATE) 200 MG/ML injection INJECT 0.75ML IM EVERY 10 DAYS AS DIRECTED 10 mL 0  . VASCEPA 1 g capsule     . vitamin B-12 (CYANOCOBALAMIN) 1000 MCG tablet Take 1,000 mcg by mouth daily.     No current facility-administered medications for this visit.     Musculoskeletal: Strength & Muscle Tone: UTA Gait & Station: Seated Patient leans: N/A  Psychiatric Specialty Exam: Review of Systems  Musculoskeletal:       Neuropathic pain - right LE  Psychiatric/Behavioral: The patient is nervous/anxious.   All other systems reviewed and are negative.   There were no vitals taken for this visit.There is no height or weight on file to calculate BMI.  General Appearance: Casual  Eye Contact:  Fair  Speech:  Clear and Coherent  Volume:  Normal  Mood:  Anxious coping OK  Affect:  Congruent  Thought Process:  Goal Directed and Descriptions of Associations:  Intact  Orientation:  Full (Time, Place, and Person)  Thought Content: Logical   Suicidal Thoughts:  No  Homicidal Thoughts:  No  Memory:  Immediate;   Fair Recent;   Fair Remote;   Fair  Judgement:  Fair  Insight:  Fair  Psychomotor Activity:  Normal  Concentration:  Concentration: Fair and Attention Span: Fair  Recall:  [employer] of Knowledge: Fair  Language: Fair  Akathisia:  No  Handed:  Right  AIMS (if indicated): UTA  Assets:  Communication Skills Desire for Improvement Housing Social Support  ADL's:  Intact  Cognition: WNL  Sleep:  Fair   Screenings: PHQ2-9     Clinical Support from 04/30/2019 in [place] Visit from 11/26/2018 in [place] from 10/11/2017 in [place] Visit from 03/12/2017 in [place] Visit from 02/02/2016 in [facility] [place]  PHQ-2 Total Score 0 0 6 1 1   PHQ-9 Total Score -- 0 24 -- --       Assessment and Plan: Kenneth Pruitt is a 69 year old Caucasian male who has a history of MDD, anxiety disorder, multiple medical problems including essential hypertension, coronary artery disease status post stent placement, hyperlipidemia, BPH, chronic pain, testosterone deficiency was evaluated by telemedicine today.  Patient is currently struggling with pain however overall he is coping and reports anxiety symptoms and depression are stable.  Plan as noted below.  Plan MDD in remission Wellbutrin 100 mg p.o. daily Lexapro 15 mg p.o. daily-reduced dosage  GAD-stable Lexapro 15 mg p.o. daily Hydroxyzine 25 mg p.o. twice daily as needed for severe anxiety attacks.  Insomnia-stable We will monitor closely  Follow-up in clinic in 3 months or sooner if needed  I have spent atleast 20 minutes face to face by video with patient today. More than 50 % of the time was spent for preparing to see the patient ( e.g., review of test, records ),ordering  medications and test ,psychoeducation and supportive psychotherapy and care coordination,as well as documenting clinical information in electronic health record. This note was generated in part or whole with voice recognition software. Voice recognition is usually quite accurate but there are transcription errors that can and very often do occur. I apologize for any typographical errors that were not detected and corrected.      Ursula Alert, MD 07/12/2020, 8:19 AM

## 2020-07-12 ENCOUNTER — Other Ambulatory Visit: Payer: Self-pay | Admitting: Family Medicine

## 2020-07-12 DIAGNOSIS — N401 Enlarged prostate with lower urinary tract symptoms: Secondary | ICD-10-CM

## 2020-07-12 DIAGNOSIS — R350 Frequency of micturition: Secondary | ICD-10-CM

## 2020-07-12 DIAGNOSIS — R972 Elevated prostate specific antigen [PSA]: Secondary | ICD-10-CM

## 2020-07-12 NOTE — Telephone Encounter (Signed)
Requested Prescriptions  Pending Prescriptions Disp Refills   dutasteride (AVODART) 0.5 MG capsule [Pharmacy Med Name: DUTASTERIDE 0.5 MG CAP] 90 capsule 1    Sig: TAKE 1 CAPSULE BY MOUTH EVERY EVENING     Urology: 5-alpha Reductase Inhibitors Passed - 07/12/2020  9:25 AM      Passed - Valid encounter within last 12 months    Recent Outpatient Visits          5 months ago Routine general medical examination at a health care facility   [facility], [place], DO   1 year ago Essential hypertension   [facility] Guadalupe Maple, MD   1 year ago Need for pneumococcal vaccination   [facility] Guadalupe Maple, MD   1 year ago Hypokalemia   [facility] [place], Jeannette How, MD   2 years ago Essential hypertension   [place], Jeannette How, MD      Future Appointments            In 1 month Johnson, [name], DO [place], [place]   In 11 months [place], [name], [place]

## 2020-07-12 NOTE — Telephone Encounter (Signed)
Requested Prescriptions  Pending Prescriptions Disp Refills  . tamsulosin (FLOMAX) 0.4 MG CAPS capsule [Pharmacy Med Name: TAMSULOSIN HCL 0.4 MG CAP] 90 capsule 1    Sig: TAKE 1 CAPSULE BY MOUTH DAILY     Urology: Alpha-Adrenergic Blocker Failed - 07/12/2020  2:12 PM      Failed - Last BP in normal range    BP Readings from Last 1 Encounters:  06/06/20 (!) 147/85         Passed - Valid encounter within last 12 months    Recent Outpatient Visits          5 months ago Routine general medical examination at a health care facility   [place], [place], DO   1 year ago Essential hypertension   [place], Jeannette How, MD   1 year ago Need for pneumococcal vaccination   [facility] Guadalupe Maple, MD   1 year ago Hypokalemia   [facility] [place], Jeannette How, MD   2 years ago Essential hypertension   [place], Jeannette How, MD      Future Appointments            In 1 month Johnson, [name], DO [place], [place]   In 11 months [place], [name], [place]

## 2020-07-24 NOTE — Progress Notes (Signed)
[facility]  Telephone:([phone]) [phone] [phone]) [phone]  ID: [name] OB: 02-04-1951  MR#: [id number]  UXN#:[id number]  Patient Care Team: [name], DO as PCP - General (Family Medicine) Brendolyn Patty, MD (Dermatology) Earnestine Leys, MD (Specialist) Dionisio David, MD as Consulting Physician (Cardiology) Elvina Mattes, Adele Schilder as Attending Physician (Podiatry) Grayland Ormond Kathlene November, MD as Consulting Physician (Oncology)  CHIEF COMPLAINT: Secondary polycythemia.  INTERVAL HISTORY: Patient returns to clinic today for repeat laboratory work and consideration of additional phlebotomy.  He continues to take physical therapy to rehab from his neck surgery, but otherwise feels well and is asymptomatic.  He continues to ride his bike frequently.  He has no neurologic complaints.  He denies any recent fevers or illnesses. He has a good appetite and denies weight loss.  He denies any chest pain, shortness of breath, cough, or hemoptysis.  He denies any nausea, vomiting, constipation, or diarrhea. He has no urinary complaints.  Patient offers no further specific complaints today.  REVIEW OF SYSTEMS:   Review of Systems  Constitutional: Negative.  Negative for fever, malaise/fatigue and weight loss.  Respiratory: Negative.  Negative for cough and shortness of breath.   Cardiovascular: Negative.  Negative for chest pain and leg swelling.  Gastrointestinal: Negative.  Negative for abdominal pain, blood in stool and melena.  Genitourinary: Negative.  Negative for dysuria.  Musculoskeletal: Negative.  Negative for back pain, joint pain and neck pain.  Skin: Negative.  Negative for rash.  Neurological: Negative.  Negative for dizziness, tingling, sensory change, focal weakness, weakness and headaches.  Psychiatric/Behavioral: Negative.  The patient is not nervous/anxious.     As per HPI. Otherwise, a complete review of systems is negative.  PAST MEDICAL HISTORY: Past  Medical History:  Diagnosis Date  . Arthritis   . Coronary artery disease   . Depression   . Hyperlipidemia     PAST SURGICAL HISTORY: Past Surgical History:  Procedure Laterality Date  . ANKLE FRACTURE SURGERY Right   . ARTHRODESIS ANT INTERBODY INC DISCECTOMY, CERVICAL BELOW C2   05/2019  . CARDIAC CATHETERIZATION N/A 04/26/2015   Procedure: Left Heart Cath;  Surgeon: Dionisio David, MD;  Location: [facility];  Service: Cardiovascular;  Laterality: N/A;  . CORONARY ANGIOPLASTY    . CORONARY STENT INTERVENTION N/A 03/27/2018   Procedure: CORONARY STENT INTERVENTION;  Surgeon: Yolonda Kida, MD;  Location: [facility];  Service: Cardiovascular;  Laterality: N/A;  . EYE SURGERY Bilateral    cataract  . LEFT HEART CATH AND CORONARY ANGIOGRAPHY Left 03/27/2018   Procedure: Left Heart Cath with possible coronary intervention;  Surgeon: Dionisio David, MD;  Location: [place];  Service: Cardiovascular;  Laterality: Left;  . TOTAL KNEE ARTHROPLASTY Right 10/08/2018   Procedure: TOTAL KNEE ARTHROPLASTY;  Surgeon: Lovell [name], MD;  Location: [facility];  Service: Orthopedics;  Laterality: Right;    FAMILY HISTORY: Family History  Problem Relation Age of Onset  . Diabetes Mother   . Hypertension Mother   . Cancer Father   . Depression Father   . Anxiety disorder Father   . Depression Daughter     ADVANCED DIRECTIVES (Y/N):  N  HEALTH MAINTENANCE: Social History   Tobacco Use  . Smoking status: Former Smoker    Years: 30.00    Types: Cigars    Quit date: 07/18/2006    Years since quitting: 14.0  . Smokeless tobacco: Never Used  Vaping Use  .  Vaping Use: Never used  Substance Use Topics  . Alcohol use: No  . Drug use: No     Colonoscopy:  PAP:  Bone density:  Lipid panel:  Allergies  Allergen Reactions  . Clonidine Derivatives Shortness Of Breath and Other (See Comments)    Low heart rate  . Clonidine     Mild bradycardia +  Somnolence    Current Outpatient Medications  Medication Sig Dispense Refill  . acetaminophen (TYLENOL) 500 MG tablet Take by mouth.    Marland Kitchen amLODipine (NORVASC) 5 MG tablet TAKE 1 TABLET BY MOUTH DAILY 30 tablet 5  . amoxicillin (AMOXIL) 500 MG capsule     . aspirin 81 MG EC tablet Take 1 tablet (81 mg total) by mouth daily. 90 tablet 3  . buPROPion (WELLBUTRIN) 100 MG tablet TAKE ONE TABLET EVERY DAY 90 tablet 1  . chlorthalidone (HYGROTON) 25 MG tablet     . Cholecalciferol (CVS VIT D 5000 HIGH-POTENCY) 5000 units capsule Take 5,000 Units by mouth daily.    . clopidogrel (PLAVIX) 75 MG tablet Take 75 mg by mouth daily.    . clotrimazole-betamethasone (LOTRISONE) cream Apply 1 application topically 2 (two) times daily. 30 g 0  . diclofenac Sodium (VOLTAREN) 1 % GEL Voltaren 1 % topical gel  APPLY 2 GRAMS TO THE AFFECTED AREA(S) BY TOPICAL ROUTE 4 TIMES PER DAY    . dutasteride (AVODART) 0.5 MG capsule TAKE 1 CAPSULE BY MOUTH EVERY EVENING 90 capsule 1  . escitalopram (LEXAPRO) 10 MG tablet TAKE 1 AND 1/2 TABLET AT BEDTIME 135 tablet 1  . fluticasone (FLONASE) 50 MCG/ACT nasal spray Place 2 sprays into both nostrils daily. 16 g 12  . hydrALAZINE (APRESOLINE) 100 MG tablet Take 50-100 mg by mouth See admin instructions. Take 50 mg by mouth in the morning and take 100 mg by mouth at bedtime    . hydrochlorothiazide (HYDRODIURIL) 25 MG tablet Take 1 tablet (25 mg total) by mouth daily. 90 tablet 1  . hydrocortisone (ANUSOL-HC) 2.5 % rectal cream APPLY RECTALLY TWICE DAILY AS DIRECTED 30 g 0  . hydrocortisone 2.5 % cream as needed.    Marland Kitchen icosapent Ethyl (VASCEPA) 1 g capsule Take by mouth.    . losartan (COZAAR) 100 MG tablet Take 1 tablet (100 mg total) by mouth daily. 90 tablet 1  . meloxicam (MOBIC) 15 MG tablet TAKE 1 TABLET BY MOUTH DAILY 90 tablet 1  . methocarbamol (ROBAXIN) 500 MG tablet Take by mouth.    . niacin (NIASPAN) 1000 MG CR tablet Take 2 tablets (2,000 mg total) by mouth at  bedtime. 180 tablet 1  . Niacin CR 1000 MG TBCR Take by mouth.    . Omega-3 1000 MG CAPS Take by mouth.    . ondansetron (ZOFRAN-ODT) 4 MG disintegrating tablet     . pantoprazole (PROTONIX) 20 MG tablet Take 20 mg by mouth daily.     . pregabalin (LYRICA) 75 MG capsule     . rosuvastatin (CRESTOR) 20 MG tablet Take 1 tablet (20 mg total) by mouth every evening. 90 tablet 1  . tamsulosin (FLOMAX) 0.4 MG CAPS capsule TAKE 1 CAPSULE BY MOUTH DAILY 90 capsule 1  . testosterone cypionate (DEPOTESTOSTERONE CYPIONATE) 200 MG/ML injection INJECT 0.75ML IM EVERY 10 DAYS AS DIRECTED 10 mL 0  . tizanidine (ZANAFLEX) 2 MG capsule Take by mouth.    Marland Kitchen VASCEPA 1 g capsule     . vitamin B-12 (CYANOCOBALAMIN) 1000 MCG tablet Take 1,000 mcg  by mouth daily.     No current facility-administered medications for this visit.    OBJECTIVE: Vitals:   07/25/20 1417  BP: (!) 141/76  Pulse: 76  Resp: 20  Temp: 97.8 F (36.6 C)  SpO2: 99%     Body mass index is 30.58 kg/m.    ECOG FS:0 - Asymptomatic  General: Well-developed, well-nourished, no acute distress. Eyes: Pink conjunctiva, anicteric sclera. HEENT: Normocephalic, moist mucous membranes. Lungs: No audible wheezing or coughing. Heart: Regular rate and rhythm. Abdomen: Soft, nontender, no obvious distention. Musculoskeletal: No edema, cyanosis, or clubbing. Neuro: Alert, answering all questions appropriately. Cranial nerves grossly intact. Skin: No rashes or petechiae noted. Psych: Normal affect.  LAB RESULTS:  Lab Results  Component Value Date   NA 141 02/12/2020   K 3.6 02/12/2020   CL 105 02/12/2020   CO2 23 02/12/2020   GLUCOSE 97 02/12/2020   BUN 18 02/12/2020   CREATININE 1.07 02/12/2020   CALCIUM 8.6 02/12/2020   PROT 5.9 (L) 02/12/2020   ALBUMIN 4.2 02/12/2020   AST 17 02/12/2020   ALT 18 02/12/2020   ALKPHOS 55 02/12/2020   BILITOT 0.4 02/12/2020   GFRNONAA 71 02/12/2020   GFRAA 82 02/12/2020    Lab Results   Component Value Date   WBC 8.6 07/25/2020   NEUTROABS 6.4 07/25/2020   HGB 16.4 07/25/2020   HCT 48.7 07/25/2020   MCV 91.5 07/25/2020   PLT 202 07/25/2020     STUDIES: No results found.  ASSESSMENT: Secondary polycythemia  PLAN:    1. 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  Patient last had phlebotomy approximately 4 months ago.  Previously, it was agreed upon the goal hemoglobin will be between 17.0 and 17.5.  Hemoglobin is 16.4 today, therefore he does not require treatment.  Patient expressed understanding that as long as he requires testosterone, he likely will require periodic phlebotomies.  Return to clinic in 4 months with repeat laboratory, further evaluation, and continuation of phlebotomy if needed. 2. Low testosterone: Chronic and unchanged.  Patient reports he takes testosterone injections approximately every 10 days.  Continue to treatment with testosterone per urology. 3.  Hypertension: Mildly elevated today.  Continue monitoring and treatment per primary care.   Patient expressed understanding and was in agreement with this plan. He also understands that He can call clinic at any time with any questions, concerns, or complaints.    Lloyd Huger, MD   07/25/2020 4:07 PM

## 2020-07-25 ENCOUNTER — Encounter: Payer: Self-pay | Admitting: Oncology

## 2020-07-25 ENCOUNTER — Other Ambulatory Visit: Payer: Self-pay

## 2020-07-25 ENCOUNTER — Inpatient Hospital Stay: Payer: Medicare PPO

## 2020-07-25 ENCOUNTER — Inpatient Hospital Stay: Payer: Medicare PPO | Attending: Oncology

## 2020-07-25 ENCOUNTER — Inpatient Hospital Stay: Payer: Medicare PPO | Admitting: Oncology

## 2020-07-25 VITALS — BP 141/76 | HR 76 | Temp 97.8°F | Resp 20 | Wt 225.5 lb

## 2020-07-25 DIAGNOSIS — I10 Essential (primary) hypertension: Secondary | ICD-10-CM | POA: Diagnosis not present

## 2020-07-25 DIAGNOSIS — Z87891 Personal history of nicotine dependence: Secondary | ICD-10-CM | POA: Diagnosis not present

## 2020-07-25 DIAGNOSIS — D751 Secondary polycythemia: Secondary | ICD-10-CM | POA: Insufficient documentation

## 2020-07-25 DIAGNOSIS — E291 Testicular hypofunction: Secondary | ICD-10-CM | POA: Diagnosis not present

## 2020-07-25 DIAGNOSIS — Z96651 Presence of right artificial knee joint: Secondary | ICD-10-CM | POA: Diagnosis not present

## 2020-07-25 LAB — CBC WITH DIFFERENTIAL/PLATELET
Abs Immature Granulocytes: 0.02 10*3/uL (ref 0.00–0.07)
Basophils Absolute: 0.1 10*3/uL (ref 0.0–0.1)
Basophils Relative: 1 %
Eosinophils Absolute: 0.2 10*3/uL (ref 0.0–0.5)
Eosinophils Relative: 2 %
HCT: 48.7 % (ref 39.0–52.0)
Hemoglobin: 16.4 g/dL (ref 13.0–17.0)
Immature Granulocytes: 0 %
Lymphocytes Relative: 16 %
Lymphs Abs: 1.4 10*3/uL (ref 0.7–4.0)
MCH: 30.8 pg (ref 26.0–34.0)
MCHC: 33.7 g/dL (ref 30.0–36.0)
MCV: 91.5 fL (ref 80.0–100.0)
Monocytes Absolute: 0.6 10*3/uL (ref 0.1–1.0)
Monocytes Relative: 7 %
Neutro Abs: 6.4 10*3/uL (ref 1.7–7.7)
Neutrophils Relative %: 74 %
Platelets: 202 10*3/uL (ref 150–400)
RBC: 5.32 MIL/uL (ref 4.22–5.81)
RDW: 13.4 % (ref 11.5–15.5)
WBC: 8.6 10*3/uL (ref 4.0–10.5)
nRBC: 0 % (ref 0.0–0.2)

## 2020-08-15 ENCOUNTER — Ambulatory Visit (INDEPENDENT_AMBULATORY_CARE_PROVIDER_SITE_OTHER): Payer: Medicare PPO | Admitting: Family Medicine

## 2020-08-15 ENCOUNTER — Other Ambulatory Visit: Payer: Self-pay

## 2020-08-15 ENCOUNTER — Encounter: Payer: Self-pay | Admitting: Family Medicine

## 2020-08-15 VITALS — BP 116/64 | HR 73 | Temp 98.3°F | Ht 72.84 in | Wt 224.0 lb

## 2020-08-15 DIAGNOSIS — N183 Chronic kidney disease, stage 3 unspecified: Secondary | ICD-10-CM

## 2020-08-15 DIAGNOSIS — E291 Testicular hypofunction: Secondary | ICD-10-CM

## 2020-08-15 DIAGNOSIS — I10 Essential (primary) hypertension: Secondary | ICD-10-CM

## 2020-08-15 DIAGNOSIS — N401 Enlarged prostate with lower urinary tract symptoms: Secondary | ICD-10-CM

## 2020-08-15 DIAGNOSIS — E782 Mixed hyperlipidemia: Secondary | ICD-10-CM

## 2020-08-15 DIAGNOSIS — F3342 Major depressive disorder, recurrent, in full remission: Secondary | ICD-10-CM

## 2020-08-15 DIAGNOSIS — R7309 Other abnormal glucose: Secondary | ICD-10-CM

## 2020-08-15 DIAGNOSIS — R350 Frequency of micturition: Secondary | ICD-10-CM

## 2020-08-15 DIAGNOSIS — F411 Generalized anxiety disorder: Secondary | ICD-10-CM

## 2020-08-15 LAB — BAYER DCA HB A1C WAIVED: HB A1C (BAYER DCA - WAIVED): 5.5 % (ref <7.0)

## 2020-08-15 MED ORDER — LOSARTAN POTASSIUM 100 MG PO TABS: 100.0000 mg | ORAL_TABLET | Freq: Every day | ORAL | 1 refills | Status: DC

## 2020-08-15 MED ORDER — AMLODIPINE BESYLATE 5 MG PO TABS: 5.0000 mg | ORAL_TABLET | Freq: Every day | ORAL | 1 refills | Status: DC

## 2020-08-15 MED ORDER — MELOXICAM 15 MG PO TABS
15.0000 mg | ORAL_TABLET | Freq: Every day | ORAL | 1 refills | Status: DC
Start: 2020-08-15 — End: 2021-02-21

## 2020-08-15 MED ORDER — DUTASTERIDE 0.5 MG PO CAPS: 0.5000 mg | ORAL_CAPSULE | Freq: Every evening | ORAL | 1 refills | Status: DC

## 2020-08-15 MED ORDER — ROSUVASTATIN CALCIUM 20 MG PO TABS: 20.0000 mg | ORAL_TABLET | Freq: Every evening | ORAL | 1 refills | Status: DC

## 2020-08-15 NOTE — Progress Notes (Signed)
BP 116/64   Pulse 73   Temp 98.3 F (36.8 C) (Oral)   Ht 6' 0.84" (1.85 m)   Wt 224 lb (101.6 kg)   SpO2 97%   BMI 29.69 kg/m    Subjective:    Patient ID: Kenneth Pruitt, male    DOB: Jan 22, 1951, 69 y.o.   MRN: [id number]  HPI: Kenneth Pruitt is a 69 y.o. male  Chief Complaint  Patient presents with  . Hypertension  . Medication Refill   HYPERTENSION / HYPERLIPIDEMIA Satisfied with current treatment? yes Duration of hypertension: chronic BP monitoring frequency: not checking BP medication side effects: no Duration of hyperlipidemia: chronic Cholesterol medication side effects: no Cholesterol supplements: fish oil and niacin Past cholesterol medications: crestor, niacin, vescepa Medication compliance: excellent compliance Aspirin: no Recent stressors: no Recurrent headaches: no Visual changes: no Palpitations: no Dyspnea: no Chest pain: no Lower extremity edema: no Dizzy/lightheaded: no  ANXIETY/DEPRESSION Duration:stable Anxious mood: yes  Excessive worrying: no Irritability: no  Sweating: no Nausea: no Palpitations:no Hyperventilation: no Panic attacks: no Agoraphobia: no  Obscessions/compulsions: no Depressed mood: yes Depression screen [facility] 2/9 08/15/2020 04/30/2019 11/26/2018 10/11/2017 03/12/2017  Decreased Interest 0 0 0 3 0  Down, Depressed, Hopeless 3 0 0 3 1  PHQ - 2 Score 3 0 0 6 1  Altered sleeping 0 - 0 2 -  Tired, decreased energy 3 - 0 3 -  Change in appetite 2 - 0 3 -  Feeling bad or failure about yourself  3 - 0 3 -  Trouble concentrating 0 - 0 3 -  Moving slowly or fidgety/restless 0 - 0 2 -  Suicidal thoughts 0 - 0 2 -  PHQ-9 Score 11 - 0 24 -  Difficult doing work/chores - - Not difficult at all Somewhat difficult -   Anhedonia: no Weight changes: no Insomnia: no   Hypersomnia: no Fatigue/loss of energy: yes Feelings of worthlessness: no Feelings of guilt: no Impaired concentration/indecisiveness: no Suicidal ideations:  no  Crying spells: no Recent Stressors/Life Changes: no   Relationship problems: no   Family stress: no     Financial stress: no    Job stress: no    Recent death/loss: no  BPH BPH status: controlled Satisfied with current treatment?: yes Medication side effects: no Medication compliance: excellent compliance Duration: chronic Nocturia: 1-2x per night Urinary frequency:no Incomplete voiding: yes Urgency: no Weak urinary stream: yes Straining to start stream: no Dysuria: no Onset: gradual Severity: mild   Relevant past medical, surgical, family and social history reviewed and updated as indicated. Interim medical history since our last visit reviewed. Allergies and medications reviewed and updated.  Review of Systems  Constitutional: Negative.   Respiratory: Negative.   Cardiovascular: Negative.   Gastrointestinal: Negative.   Musculoskeletal: Negative.   Neurological: Negative.   Psychiatric/Behavioral: Negative.     Per HPI unless specifically indicated above     Objective:    BP 116/64   Pulse 73   Temp 98.3 F (36.8 C) (Oral)   Ht 6' 0.84" (1.85 m)   Wt 224 lb (101.6 kg)   SpO2 97%   BMI 29.69 kg/m   Wt Readings from Last 3 Encounters:  08/15/20 224 lb (101.6 kg)  07/25/20 225 lb 8 oz (102.3 kg)  06/06/20 210 lb (95.3 kg)    Physical Exam Vitals and nursing note reviewed.  Constitutional:      General: He is not in acute distress.    Appearance: Normal appearance.  He is not ill-appearing, toxic-appearing or diaphoretic.  HENT:     Head: Normocephalic and atraumatic.     Right Ear: External ear normal.     Left Ear: External ear normal.     Nose: Nose normal.     Mouth/Throat:     Mouth: Mucous membranes are moist.     Pharynx: Oropharynx is clear.  Eyes:     General: No scleral icterus.       Right eye: No discharge.        Left eye: No discharge.     Extraocular Movements: Extraocular movements intact.     Conjunctiva/sclera:  Conjunctivae normal.     Pupils: Pupils are equal, round, and reactive to light.  Cardiovascular:     Rate and Rhythm: Normal rate and regular rhythm.     Pulses: Normal pulses.     Heart sounds: Normal heart sounds. No murmur heard.  No friction rub. No gallop.   Pulmonary:     Effort: Pulmonary effort is normal. No respiratory distress.     Breath sounds: Normal breath sounds. No stridor. No wheezing, rhonchi or rales.  Chest:     Chest wall: No tenderness.  Musculoskeletal:        General: Normal range of motion.     Cervical back: Normal range of motion and neck supple.  Skin:    General: Skin is warm and dry.     Capillary Refill: Capillary refill takes less than 2 seconds.     Coloration: Skin is not jaundiced or pale.     Findings: No bruising, erythema, lesion or rash.  Neurological:     General: No focal deficit present.     Mental Status: He is alert and oriented to person, place, and time. Mental status is at baseline.  Psychiatric:        Mood and Affect: Mood normal.        Behavior: Behavior normal.        Thought Content: Thought content normal.        Judgment: Judgment normal.     Results for orders placed or performed in visit on 07/25/20  CBC with Differential  Result Value Ref Range   WBC 8.6 4.0 - 10.5 K/uL   RBC 5.32 4.22 - 5.81 MIL/uL   Hemoglobin 16.4 13.0 - 17.0 g/dL   HCT 48.7 39 - 52 %   MCV 91.5 80.0 - 100.0 fL   MCH 30.8 26.0 - 34.0 pg   MCHC 33.7 30.0 - 36.0 g/dL   RDW 13.4 11.5 - 15.5 %   Platelets 202 150 - 400 K/uL   nRBC 0.0 0.0 - 0.2 %   Neutrophils Relative % 74 %   Neutro Abs 6.4 1.7 - 7.7 K/uL   Lymphocytes Relative 16 %   Lymphs Abs 1.4 0.7 - 4.0 K/uL   Monocytes Relative 7 %   Monocytes Absolute 0.6 0.1 - 1.0 K/uL   Eosinophils Relative 2 %   Eosinophils Absolute 0.2 0.0 - 0.5 K/uL   Basophils Relative 1 %   Basophils Absolute 0.1 0.0 - 0.1 K/uL   Immature Granulocytes 0 %   Abs Immature Granulocytes 0.02 0.00 - 0.07 K/uL        Assessment & Plan:   Problem List Items Addressed This Visit      Cardiovascular and Mediastinum   Essential hypertension - Primary    Under good control on current regimen. Continue current regimen. Continue to monitor. Call with  any concerns. Refills given. Labs drawn today.        Relevant Medications   rosuvastatin (CRESTOR) 20 MG tablet   losartan (COZAAR) 100 MG tablet   amLODipine (NORVASC) 5 MG tablet   Other Relevant Orders   Bayer DCA Hb A1c Waived   CBC with Differential/Platelet   Comprehensive metabolic panel     Endocrine   Hypogonadism in male    Under good control on current regimen. Continue current regimen. Continue to monitor. Call with any concerns. Refills through urology. Labs drawn today.       Relevant Orders   CBC with Differential/Platelet   Comprehensive metabolic panel   PSA   Testosterone, free, total([facility])     Genitourinary   BPH (benign prostatic hyperplasia)    Under good control on current regimen. Continue current regimen. Continue to monitor. Call with any concerns. Refills given. Labs drawn today.       Relevant Medications   dutasteride (AVODART) 0.5 MG capsule   Other Relevant Orders   CBC with Differential/Platelet   Comprehensive metabolic panel   PSA   Testosterone, free, total([facility])   Chronic kidney disease (CKD), stage III (moderate) ([place])    Rechecking labs today. Await results.       Relevant Orders   CBC with Differential/Platelet   Comprehensive metabolic panel     Other   Hyperlipidemia    Under good control on current regimen. Continue current regimen. Continue to monitor. Call with any concerns. Refills given. Labs drawn today.       Relevant Medications   rosuvastatin (CRESTOR) 20 MG tablet   losartan (COZAAR) 100 MG tablet   amLODipine (NORVASC) 5 MG tablet   Other Relevant Orders   CBC with Differential/Platelet   Comprehensive metabolic panel   Lipid Panel w/o Chol/HDL  Ratio   GAD (generalized anxiety disorder)    Follows with psychiatry. Call with any concerns. Continue to monitor.       Relevant Orders   CBC with Differential/Platelet   Comprehensive metabolic panel   Elevated glucose    A1c under good control with A1c of 5.5. Continue diet and exercise. Call with any concerns.       Relevant Orders   CBC with Differential/Platelet   Comprehensive metabolic panel   MDD (major depressive disorder), recurrent, in full remission ([place])    Follows with psychiatry. Call with any concerns. Continue to monitor.       Relevant Orders   CBC with Differential/Platelet   Comprehensive metabolic panel       Follow up plan: Return in about 6 months (around 02/12/2021) for Physical.

## 2020-08-15 NOTE — Assessment & Plan Note (Signed)
Follows with psychiatry. Call with any concerns. Continue to monitor.

## 2020-08-15 NOTE — Assessment & Plan Note (Signed)
A1c under good control with A1c of 5.5. Continue diet and exercise. Call with any concerns.

## 2020-08-15 NOTE — Assessment & Plan Note (Signed)
Under good control on current regimen. Continue current regimen. Continue to monitor. Call with any concerns. Refills through urology. Labs drawn today.

## 2020-08-15 NOTE — Assessment & Plan Note (Signed)
Under good control on current regimen. Continue current regimen. Continue to monitor. Call with any concerns. Refills given. Labs drawn today.   

## 2020-08-15 NOTE — Assessment & Plan Note (Signed)
Rechecking labs today. Await results.  

## 2020-08-17 LAB — CBC WITH DIFFERENTIAL/PLATELET
Basophils Absolute: 0.1 10*3/uL (ref 0.0–0.2)
Basos: 1 %
EOS (ABSOLUTE): 0.2 10*3/uL (ref 0.0–0.4)
Eos: 3 %
Hematocrit: 51.7 % — ABNORMAL HIGH (ref 37.5–51.0)
Hemoglobin: 17.7 g/dL (ref 13.0–17.7)
Immature Grans (Abs): 0 10*3/uL (ref 0.0–0.1)
Immature Granulocytes: 0 %
Lymphocytes Absolute: 1.3 10*3/uL (ref 0.7–3.1)
Lymphs: 27 %
MCH: 31.2 pg (ref 26.6–33.0)
MCHC: 34.2 g/dL (ref 31.5–35.7)
MCV: 91 fL (ref 79–97)
Monocytes Absolute: 0.6 10*3/uL (ref 0.1–0.9)
Monocytes: 12 %
Neutrophils Absolute: 2.9 10*3/uL (ref 1.4–7.0)
Neutrophils: 57 %
Platelets: 223 10*3/uL (ref 150–450)
RBC: 5.68 x10E6/uL (ref 4.14–5.80)
RDW: 13.7 % (ref 11.6–15.4)
WBC: 5 10*3/uL (ref 3.4–10.8)

## 2020-08-17 LAB — COMPREHENSIVE METABOLIC PANEL
ALT: 22 IU/L (ref 0–44)
AST: 25 IU/L (ref 0–40)
Albumin/Globulin Ratio: 2.3 — ABNORMAL HIGH (ref 1.2–2.2)
Albumin: 4.5 g/dL (ref 3.8–4.8)
Alkaline Phosphatase: 62 IU/L (ref 44–121)
BUN/Creatinine Ratio: 15 (ref 10–24)
BUN: 17 mg/dL (ref 8–27)
Bilirubin Total: 0.4 mg/dL (ref 0.0–1.2)
CO2: 24 mmol/L (ref 20–29)
Calcium: 9.4 mg/dL (ref 8.6–10.2)
Chloride: 99 mmol/L (ref 96–106)
Creatinine, Ser: 1.14 mg/dL (ref 0.76–1.27)
GFR calc Af Amer: 75 mL/min/{1.73_m2} (ref >59)
GFR calc non Af Amer: 65 mL/min/{1.73_m2} (ref >59)
Globulin, Total: 2 g/dL (ref 1.5–4.5)
Glucose: 77 mg/dL (ref 65–99)
Potassium: 3.7 mmol/L (ref 3.5–5.2)
Sodium: 140 mmol/L (ref 134–144)
Total Protein: 6.5 g/dL (ref 6.0–8.5)

## 2020-08-17 LAB — TESTOSTERONE, FREE, TOTAL, SHBG
Sex Hormone Binding: 26.6 nmol/L (ref 19.3–76.4)
Testosterone, Free: 21.4 pg/mL — ABNORMAL HIGH (ref 6.6–18.1)
Testosterone: 1014 ng/dL — ABNORMAL HIGH (ref 264–916)

## 2020-08-17 LAB — PSA: Prostate Specific Ag, Serum: 2.5 ng/mL (ref 0.0–4.0)

## 2020-08-17 LAB — LIPID PANEL W/O CHOL/HDL RATIO
Cholesterol, Total: 99 mg/dL — ABNORMAL LOW (ref 100–199)
HDL: 35 mg/dL — ABNORMAL LOW (ref >39)
LDL Chol Calc (NIH): 40 mg/dL (ref 0–99)
Triglycerides: 138 mg/dL (ref 0–149)
VLDL Cholesterol Cal: 24 mg/dL (ref 5–40)

## 2020-08-24 ENCOUNTER — Other Ambulatory Visit: Payer: Medicare Other

## 2020-08-26 ENCOUNTER — Telehealth: Payer: Self-pay | Admitting: *Deleted

## 2020-08-26 ENCOUNTER — Ambulatory Visit: Payer: Medicare Other | Admitting: Urology

## 2020-08-26 MED ORDER — TESTOSTERONE CYPIONATE 200 MG/ML IM SOLN
INTRAMUSCULAR | 0 refills | Status: DC
Start: 2020-08-26 — End: 2020-10-06

## 2020-08-26 NOTE — Telephone Encounter (Signed)
Patient states the injection was  about two to three weeks from the day of lab. He states sometime he forgets to give it on time.

## 2020-08-26 NOTE — Telephone Encounter (Signed)
Testosterone was significantly elevated for an injection done 2-3 weeks prior to the lab.  It was >1000.  Recommend decreasing his dose to 0.5 cc every 10 days and repeat trough testosterone level in 6 weeks

## 2020-08-26 NOTE — Telephone Encounter (Signed)
-----   Message from Abbie Sons, MD sent at 08/25/2020  8:30 PM EST ----- Please find out when pts last injection was in relation to this blood draw ----- Message ----- From: [name], DO Sent: 08/22/2020   8:34 PM EST To: Abbie Sons, MD  Patient notified of results  by mychart  Dr. Elenor Quinones, I recently saw our mutual patient for his physical. I've attached his labs done. He is aware. Have a great day!

## 2020-08-26 NOTE — Telephone Encounter (Signed)
Notified patient as instructed, patient pleased. Discussed follow-up appointments, patient agrees  

## 2020-08-26 NOTE — Addendum Note (Signed)
Addended by: Abbie Sons on: 08/26/2020 02:43 PM   Modules accepted: Orders

## 2020-09-05 ENCOUNTER — Other Ambulatory Visit: Payer: Self-pay | Admitting: Psychiatry

## 2020-09-05 DIAGNOSIS — F3342 Major depressive disorder, recurrent, in full remission: Secondary | ICD-10-CM

## 2020-10-03 ENCOUNTER — Other Ambulatory Visit: Payer: Self-pay | Admitting: Urology

## 2020-10-03 ENCOUNTER — Other Ambulatory Visit: Payer: Self-pay | Admitting: Family Medicine

## 2020-10-03 DIAGNOSIS — E782 Mixed hyperlipidemia: Secondary | ICD-10-CM

## 2020-10-03 NOTE — Telephone Encounter (Signed)
Requested medication (s) are due for refill today: yes  Requested medication (s) are on the active medication list:yes  Last refill:  02/12/20  #180  1 refill  Future visit scheduled: yes  Notes to clinic:  Med listed on med list  with Dr [name] ordering and again as historic Labs last check 08/15/20 out of range. Please review.    Requested Prescriptions  Pending Prescriptions Disp Refills   niacin (NIASPAN) 1000 MG CR tablet [Pharmacy Med Name: NIACIN ER (ANTIHYPERLIPIDEMIC) 1000] 180 tablet 1    Sig: TAKE  2 TABLETS BY MOUTH AT BEDTIME      Cardiovascular:  Antilipid - niacin Failed - 10/03/2020  9:55 AM      Failed - Total Cholesterol in normal range and within 360 days    Cholesterol, Total  Date Value Ref Range Status  08/15/2020 99 (L) 100 - 199 mg/dL Final   Cholesterol Piccolo, Waived  Date Value Ref Range Status  08/06/2016 115 <200 mg/dL Final    Comment:                            Desirable                <200                         Borderline High      200- 239                         High                     >239           Failed - LDL in normal range and within 360 days    LDL Chol Calc (NIH)  Date Value Ref Range Status  08/15/2020 40 0 - 99 mg/dL Final          Failed - HDL in normal range and within 360 days    HDL  Date Value Ref Range Status  08/15/2020 35 (L) >39 mg/dL Final          Passed - AST in normal range and within 360 days    AST  Date Value Ref Range Status  08/15/2020 25 0 - 40 IU/L Final   AST (SGOT) Piccolo, Waived  Date Value Ref Range Status  08/06/2016 28 11 - 38 U/L Final          Passed - ALT in normal range and within 360 days    ALT  Date Value Ref Range Status  08/15/2020 22 0 - 44 IU/L Final   ALT (SGPT) Piccolo, Waived  Date Value Ref Range Status  08/06/2016 23 10 - 47 U/L Final          Passed - Triglycerides in normal range and within 360 days    Triglycerides  Date Value Ref Range Status   08/15/2020 138 0 - 149 mg/dL Final   Triglycerides Piccolo,Waived  Date Value Ref Range Status  08/06/2016 188 (H) <150 mg/dL Final    Comment:                            Normal                   <150  Borderline High     150 - 199                         High                200 - 499                         Very High                >499           Passed - Valid encounter within last 12 months    Recent Outpatient Visits           1 month ago Essential hypertension   [place], [name], DO   7 months ago Routine general medical examination at a health care facility   [facility] [place], DO   1 year ago Essential hypertension   [place], Mark A, MD   1 year ago Need for pneumococcal vaccination   [facility], MD   2 years ago Hypokalemia   [place], [place]       Future Appointments             In 4 months Johnson, [name], DO [place], [place]   In 8 months [name], [name], MD [place]

## 2020-10-05 ENCOUNTER — Other Ambulatory Visit: Payer: Self-pay

## 2020-10-05 DIAGNOSIS — E291 Testicular hypofunction: Secondary | ICD-10-CM

## 2020-10-07 ENCOUNTER — Other Ambulatory Visit: Payer: Medicare PPO

## 2020-10-07 ENCOUNTER — Other Ambulatory Visit: Payer: Self-pay

## 2020-10-07 DIAGNOSIS — E291 Testicular hypofunction: Secondary | ICD-10-CM | POA: Diagnosis not present

## 2020-10-08 LAB — TESTOSTERONE: Testosterone: 516 ng/dL (ref 264–916)

## 2020-10-11 ENCOUNTER — Telehealth: Payer: Self-pay | Admitting: *Deleted

## 2020-10-11 ENCOUNTER — Encounter: Payer: Self-pay | Admitting: Psychiatry

## 2020-10-11 ENCOUNTER — Telehealth (INDEPENDENT_AMBULATORY_CARE_PROVIDER_SITE_OTHER): Payer: Medicare PPO | Admitting: Psychiatry

## 2020-10-11 ENCOUNTER — Other Ambulatory Visit: Payer: Self-pay

## 2020-10-11 DIAGNOSIS — F411 Generalized anxiety disorder: Secondary | ICD-10-CM | POA: Diagnosis not present

## 2020-10-11 DIAGNOSIS — F3342 Major depressive disorder, recurrent, in full remission: Secondary | ICD-10-CM | POA: Diagnosis not present

## 2020-10-11 DIAGNOSIS — F5105 Insomnia due to other mental disorder: Secondary | ICD-10-CM | POA: Diagnosis not present

## 2020-10-11 MED ORDER — ESCITALOPRAM OXALATE 10 MG PO TABS: ORAL_TABLET | ORAL | 1 refills | Status: DC

## 2020-10-11 NOTE — Telephone Encounter (Signed)
-----   Message from [place], [place] sent at 10/11/2020  1:14 PM EST ----- Testosterone back within therapeutic range with dose reduction, this is great news. Please counsel him to continue his current dose and keep scheduled follow up later this year. ----- Message ----- From: Interface, [facility] Lab Results In Sent: 10/08/2020   9:57 AM EST To: Abbie Sons, MD

## 2020-10-11 NOTE — Progress Notes (Signed)
Virtual Visit via Video Note  I connected with Kenneth Pruitt on 10/11/20 at 10:45 AM EST by a video enabled telemedicine application and verified that I am speaking with the correct person using two identifiers. Location Provider Location : [facility] Patient Location : Home  Participants: Patient , Provider    I discussed the limitations of evaluation and management by telemedicine and the availability of in person appointments. The patient expressed understanding and agreed to proceed.  I discussed the assessment and treatment plan with the patient. The patient was provided an opportunity to ask questions and all were answered. The patient agreed with the plan and demonstrated an understanding of the instructions.   The patient was advised to call back or seek an in-person evaluation if the symptoms worsen or if the condition fails to improve as anticipated.   [place] MD OP Progress Note  10/11/2020 12:45 PM Kenneth Pruitt  MRN:  [id number]  Chief Complaint:  Chief Complaint    Follow-up     HPI: Kenneth Pruitt is a 70 year old Caucasian male, retired, married, lives in [place], has a history of MDD, GAD, insomnia, coronary artery disease, stent placement, knee pain, testosterone deficiency was evaluated by telemedicine today.  Patient today reports he is currently struggling with back pain and right lower extremity pain.  He had his back spinal surgery in September 2020 and reports he continues to struggle with pain as well as fatigue since then.  He is currently following up with the spine specialist with [facility] however is planning to transition to a new spine specialist and has upcoming appointment scheduled.  Patient reports mood wise he has been coping okay.  Denies any significant depression or anxiety symptoms.  He reports the current medication regimen as beneficial.  Patient reports sleep is overall okay.  Pain does affect his sleep some nights.  Patient denies any suicidality,  homicidality or perceptual disturbances.  Patient denies any other concerns today.  Visit Diagnosis:    ICD-10-CM   1. MDD (major depressive disorder), recurrent, in full remission ([place])  F33.42   2. GAD (generalized anxiety disorder)  F41.1 escitalopram (LEXAPRO) 10 MG tablet  3. Insomnia due to mental disorder  F51.05     Past Psychiatric History: I have reviewed past psychiatric history from my progress note on 12/16/2017  Past Medical History:  Past Medical History:  Diagnosis Date  . Arthritis   . Coronary artery disease   . Depression   . Hyperlipidemia     Past Surgical History:  Procedure Laterality Date  . ANKLE FRACTURE SURGERY Right   . ARTHRODESIS ANT INTERBODY INC DISCECTOMY, CERVICAL BELOW C2   05/2019  . CARDIAC CATHETERIZATION N/A 04/26/2015   Procedure: Left Heart Cath;  Surgeon: Dionisio David, MD;  Location: [place];  Service: Cardiovascular;  Laterality: N/A;  . CORONARY ANGIOPLASTY    . CORONARY STENT INTERVENTION N/A 03/27/2018   Procedure: CORONARY STENT INTERVENTION;  Surgeon: Yolonda Kida, MD;  Location: [place];  Service: Cardiovascular;  Laterality: N/A;  . EYE SURGERY Bilateral    cataract  . LEFT HEART CATH AND CORONARY ANGIOGRAPHY Left 03/27/2018   Procedure: Left Heart Cath with possible coronary intervention;  Surgeon: Dionisio David, MD;  Location: [facility];  Service: Cardiovascular;  Laterality: Left;  . TOTAL KNEE ARTHROPLASTY Right 10/08/2018   Procedure: TOTAL KNEE ARTHROPLASTY;  Surgeon: Lovell [name], MD;  Location: [facility];  Service: Orthopedics;  Laterality:  Right;    Family Psychiatric History: I have reviewed family psychiatric history from my progress note on 12/16/2017  Family History:  Family History  Problem Relation Age of Onset  . Diabetes Mother   . Hypertension Mother   . Cancer Father   . Depression Father   . Anxiety disorder Father   . Depression Daughter     Social  History: Reviewed social history from my progress note on 12/16/2017 Social History   Socioeconomic History  . Marital status: Married    Spouse name: Kenneth Pruitt  . Number of children: 2  . Years of education: Not on file  . Highest education level: Master's degree (e.g., MA, MS, MEng, MEd, MSW, MBA)  Occupational History    Comment: retired  Tobacco Use  . Smoking status: Former Smoker    Years: 30.00    Types: Cigars    Quit date: 07/18/2006    Years since quitting: 14.2  . Smokeless tobacco: Never Used  Vaping Use  . Vaping Use: Never used  Substance and Sexual Activity  . Alcohol use: No  . Drug use: No  . Sexual activity: Not Currently  Other Topics Concern  . Not on file  Social History Narrative  . Not on file   Social Determinants of Health   Financial Resource Strain: Not on file  Food Insecurity: Not on file  Transportation Needs: Not on file  Physical Activity: Not on file  Stress: Not on file  Social Connections: Not on file    Allergies:  Allergies  Allergen Reactions  . Clonidine Derivatives Shortness Of Breath and Other (See Comments)    Low heart rate  . Clonidine     Mild bradycardia + Somnolence    Metabolic Disorder Labs: Lab Results  Component Value Date   HGBA1C 5.5 08/15/2020   No results found for: PROLACTIN Lab Results  Component Value Date   CHOL 99 (L) 08/15/2020   TRIG 138 08/15/2020   HDL 35 (L) 08/15/2020   CHOLHDL 2.5 08/20/2019   VLDL 37 03/28/2018   LDLCALC 40 08/15/2020   LDLCALC 31 02/12/2020   Lab Results  Component Value Date   TSH 0.877 02/12/2020   TSH 1.150 11/26/2018    Therapeutic Level Labs: No results found for: LITHIUM No results found for: VALPROATE No components found for:  CBMZ  Current Medications: Current Outpatient Medications  Medication Sig Dispense Refill  . acetaminophen (TYLENOL) 500 MG tablet Take by mouth.    Marland Kitchen amLODipine (NORVASC) 5 MG tablet Take 1 tablet (5 mg total) by mouth daily. 90  tablet 1  . aspirin 81 MG EC tablet Take 1 tablet (81 mg total) by mouth daily. 90 tablet 3  . buPROPion (WELLBUTRIN) 100 MG tablet TAKE ONE TABLET EVERY DAY 90 tablet 1  . chlorthalidone (HYGROTON) 25 MG tablet     . Cholecalciferol 125 MCG (5000 UT) capsule Take 5,000 Units by mouth daily.    . clopidogrel (PLAVIX) 75 MG tablet Take 75 mg by mouth daily.    . clotrimazole-betamethasone (LOTRISONE) cream Apply 1 application topically 2 (two) times daily. 30 g 0  . diclofenac Sodium (VOLTAREN) 1 % GEL Voltaren 1 % topical gel  APPLY 2 GRAMS TO THE AFFECTED AREA(S) BY TOPICAL ROUTE 4 TIMES PER DAY    . dutasteride (AVODART) 0.5 MG capsule Take 1 capsule (0.5 mg total) by mouth every evening. 90 capsule 1  . fluticasone (FLONASE) 50 MCG/ACT nasal spray Place 2 sprays into both  nostrils daily. 16 g 12  . hydrALAZINE (APRESOLINE) 100 MG tablet Take 50-100 mg by mouth See admin instructions. Take 50 mg by mouth in the morning and take 100 mg by mouth at bedtime    . hydrocortisone (ANUSOL-HC) 2.5 % rectal cream APPLY RECTALLY TWICE DAILY AS DIRECTED 30 g 0  . hydrocortisone 2.5 % cream as needed.    Marland Kitchen losartan (COZAAR) 100 MG tablet Take 1 tablet (100 mg total) by mouth daily. 90 tablet 1  . meloxicam (MOBIC) 15 MG tablet Take 1 tablet (15 mg total) by mouth daily. 90 tablet 1  . methocarbamol (ROBAXIN) 500 MG tablet Take by mouth.    . metoprolol tartrate (LOPRESSOR) 25 MG tablet Take 25 mg by mouth daily.    . niacin (NIASPAN) 1000 MG CR tablet TAKE  2 TABLETS BY MOUTH AT BEDTIME 180 tablet 1  . Niacin CR 1000 MG TBCR Take by mouth.    . Omega-3 1000 MG CAPS Take by mouth.    . ondansetron (ZOFRAN-ODT) 4 MG disintegrating tablet     . pantoprazole (PROTONIX) 20 MG tablet Take 20 mg by mouth daily.     . pregabalin (LYRICA) 75 MG capsule 75 mg 3 (three) times daily.    . rosuvastatin (CRESTOR) 20 MG tablet Take 1 tablet (20 mg total) by mouth every evening. 90 tablet 1  . tamsulosin (FLOMAX) 0.4  MG CAPS capsule TAKE 1 CAPSULE BY MOUTH DAILY 90 capsule 1  . testosterone cypionate (DEPOTESTOSTERONE CYPIONATE) 200 MG/ML injection INJECT 0.75 ML EVERY 10 DAYS AS DIRECTED 10 mL 0  . tizanidine (ZANAFLEX) 2 MG capsule Take by mouth.    Marland Kitchen VASCEPA 1 g capsule     . vitamin B-12 (CYANOCOBALAMIN) 1000 MCG tablet Take 1,000 mcg by mouth daily.    Marland Kitchen amoxicillin (AMOXIL) 500 MG capsule  (Patient not taking: Reported on 10/11/2020)    . escitalopram (LEXAPRO) 10 MG tablet TAKE 1 AND 1/2 TABLET AT BEDTIME 135 tablet 1   No current facility-administered medications for this visit.     Musculoskeletal: Strength & Muscle Tone: UTA Gait & Station: UTA Patient leans: N/A  Psychiatric Specialty Exam: Review of Systems  Musculoskeletal: Positive for back pain.       Rt.sided LE pain  All other systems reviewed and are negative.   There were no vitals taken for this visit.There is no height or weight on file to calculate BMI.  General Appearance: Casual  Eye Contact:  Fair  Speech:  Clear and Coherent  Volume:  Normal  Mood:  Euthymic  Affect:  Congruent  Thought Process:  Goal Directed and Descriptions of Associations: Intact  Orientation:  Full (Time, Place, and Person)  Thought Content: Logical   Suicidal Thoughts:  No  Homicidal Thoughts:  No  Memory:  Immediate;   Fair Recent;   Fair Remote;   Fair  Judgement:  Fair  Insight:  Fair  Psychomotor Activity:  Normal  Concentration:  Concentration: Fair and Attention Span: Fair  Recall:  [employer] of Knowledge: Fair  Language: Fair  Akathisia:  No  Handed:  Right  AIMS (if indicated): UTA  Assets:  Communication Skills Desire for Improvement Housing Intimacy Social Support Talents/Skills Transportation Vocational/Educational  ADL's:  Intact  Cognition: WNL  Sleep:  Restless due to pain on and off - overall OK   Screenings: [employer]   Flowsheet Row Office Visit from 08/15/2020 in [facility]  from 04/30/2019 in [facility]  Family Practice Office Visit from 11/26/2018 in [facility] from 10/11/2017 in [place] Visit from 03/12/2017 in [facility]  PHQ-2 Total Score 3 0 0 6 1  PHQ-9 Total Score 11 -- 0 24 --       Assessment and Plan: KALEE MCCLENATHAN is a 70 year old Caucasian male who has a history of MDD, anxiety disorder, multiple medical problems including essential hypertension, coronary artery disease status post stent placement, hyperlipidemia, BPH, chronic pain, testosterone deficiency was evaluated by telemedicine today.  Patient is currently stable on current medication regimen.  Plan as noted below.  Plan MDD in remission Wellbutrin 100 mg p.o. daily Lexapro 15 mg p.o. daily-reduced dosage  GAD-stable Lexapro 15 mg p.o. daily Hydroxyzine 25 mg p.o. 3 times daily as needed for severe anxiety attacks  Insomnia-stable We will monitor closely.  Patient will continue to follow-up with his spine specialist for pain management.  Follow-up in clinic in 4 months or sooner if needed.  I have spent atleast 20 minutes face to face by video with patient today. More than 50 % of the time was spent for preparing to see the patient ( e.g., review of test, records ), ordering medications and test ,psychoeducation and supportive psychotherapy and care coordination,as well as documenting clinical information in electronic health record. This note was generated in part or whole with voice recognition software. Voice recognition is usually quite accurate but there are transcription errors that can and very often do occur. I apologize for any typographical errors that were not detected and corrected.        Ursula Alert, MD 10/12/2020, 8:23 AM

## 2020-10-11 NOTE — Telephone Encounter (Signed)
Notified patient as instructed, patient pleased. Discussed follow-up appointments, patient agrees  

## 2020-10-21 DIAGNOSIS — G8310 Monoplegia of lower limb affecting unspecified side: Secondary | ICD-10-CM | POA: Diagnosis not present

## 2020-10-21 DIAGNOSIS — G959 Disease of spinal cord, unspecified: Secondary | ICD-10-CM | POA: Diagnosis not present

## 2020-10-31 DIAGNOSIS — G8310 Monoplegia of lower limb affecting unspecified side: Secondary | ICD-10-CM | POA: Diagnosis not present

## 2020-10-31 DIAGNOSIS — G959 Disease of spinal cord, unspecified: Secondary | ICD-10-CM | POA: Diagnosis not present

## 2020-11-11 ENCOUNTER — Other Ambulatory Visit: Payer: Self-pay | Admitting: Family Medicine

## 2020-11-11 NOTE — Telephone Encounter (Signed)
Requested medication (s) are due for refill today: yes  Requested medication (s) are on the active medication list:yes  Last refill:  2/24//21  Future visit scheduled: yes  Notes to clinic: no assigned protocol    Requested Prescriptions  Pending Prescriptions Disp Refills   hydrocortisone (ANUSOL-HC) 2.5 % rectal cream [Pharmacy Med Name: HYDROCORTISONE (PERIANAL) 2.5% TOP] 30 g 0    Sig: APPLY RECTALLY TWICE DAILY AS DIRECTED      Off-Protocol Failed - 11/11/2020 10:04 AM      Failed - Medication not assigned to a protocol, review manually.      Passed - Valid encounter within last 12 months    Recent Outpatient Visits           2 months ago Essential hypertension   [place], [name], DO   9 months ago Routine general medical examination at a health care facility   [facility], [place], DO   1 year ago Essential hypertension   [place], Jeannette How, MD   1 year ago Need for pneumococcal vaccination   [facility] Gu[facility]upe Maple, MD   2 years ago Hypokalemia   [place], MD       Future Appointments             In 3 months [name], [name], DO [place], [place]   In 6 months [name], [name], MD [place]            Over the Counter:  OTC Passed - 11/11/2020 10:04 AM      Passed - Valid encounter within last 12 months    Recent Outpatient Visits           2 months ago Essential hypertension   [place], [name], DO   9 months ago Routine general medical examination at a health care facility   [facility] [place], DO   1 year ago Essential hypertension   [place] Guadalupe Maple, MD   1 year ago Need for pneumococcal vaccination   [facility] Guadalupe Maple, MD   2 years ago Hypokalemia   [facility] [place], Jeannette How, MD        Future Appointments             In 3 months Johnson, [name], DO [place], [place]   In 6 months Gorman, [name], MD [place]

## 2020-11-17 DIAGNOSIS — M4712 Other spondylosis with myelopathy, cervical region: Secondary | ICD-10-CM | POA: Diagnosis not present

## 2020-11-18 DIAGNOSIS — M5416 Radiculopathy, lumbar region: Secondary | ICD-10-CM | POA: Diagnosis not present

## 2020-11-21 ENCOUNTER — Inpatient Hospital Stay: Payer: Medicare PPO

## 2020-11-21 ENCOUNTER — Inpatient Hospital Stay (HOSPITAL_BASED_OUTPATIENT_CLINIC_OR_DEPARTMENT_OTHER): Payer: Medicare PPO | Admitting: Oncology

## 2020-11-21 ENCOUNTER — Inpatient Hospital Stay: Payer: Medicare PPO | Attending: Oncology

## 2020-11-21 VITALS — BP 119/66 | HR 73

## 2020-11-21 DIAGNOSIS — D751 Secondary polycythemia: Secondary | ICD-10-CM

## 2020-11-21 DIAGNOSIS — Z87891 Personal history of nicotine dependence: Secondary | ICD-10-CM | POA: Insufficient documentation

## 2020-11-21 DIAGNOSIS — Z79899 Other long term (current) drug therapy: Secondary | ICD-10-CM | POA: Diagnosis not present

## 2020-11-21 LAB — CBC WITH DIFFERENTIAL/PLATELET
Abs Immature Granulocytes: 0.01 10*3/uL (ref 0.00–0.07)
Basophils Absolute: 0.1 10*3/uL (ref 0.0–0.1)
Basophils Relative: 1 %
Eosinophils Absolute: 0.2 10*3/uL (ref 0.0–0.5)
Eosinophils Relative: 3 %
HCT: 48.3 % (ref 39.0–52.0)
Hemoglobin: 16.9 g/dL (ref 13.0–17.0)
Immature Granulocytes: 0 %
Lymphocytes Relative: 34 %
Lymphs Abs: 1.8 10*3/uL (ref 0.7–4.0)
MCH: 31.4 pg (ref 26.0–34.0)
MCHC: 35 g/dL (ref 30.0–36.0)
MCV: 89.8 fL (ref 80.0–100.0)
Monocytes Absolute: 0.8 10*3/uL (ref 0.1–1.0)
Monocytes Relative: 14 %
Neutro Abs: 2.6 10*3/uL (ref 1.7–7.7)
Neutrophils Relative %: 48 %
Platelets: 185 10*3/uL (ref 150–400)
RBC: 5.38 MIL/uL (ref 4.22–5.81)
RDW: 13.2 % (ref 11.5–15.5)
WBC: 5.3 10*3/uL (ref 4.0–10.5)
nRBC: 0 % (ref 0.0–0.2)

## 2020-11-21 NOTE — Progress Notes (Signed)
[facility]  [phone] [phone]  ID: Kenneth Pruitt OB: 28-Aug-1951  MR#: [id number]  TOI#:[id number]  Patient Care Team: [name], DO as PCP - General (Family Medicine) Brendolyn Patty, MD (Dermatology) Earnestine Leys, MD (Specialist) Dionisio David, MD as Consulting Physician (Cardiology) Elvina Mattes, Adele Schilder as Attending Physician (Podiatry) Grayland Ormond Kathlene November, MD as Consulting Physician (Oncology)  CHIEF COMPLAINT: Secondary polycythemia.  INTERVAL HISTORY: Patient returns to clinic today for repeat laboratory work and consideration of additional phlebotomy.  He was last seen in clinic on 07/25/20.   He last had a phlebotomy in July 2021.  In the interim, he has done well.  He is followed by Dr. Bernardo Heater for his testosterone injections.  His testosterone dose was recently decreased in half secondary to elevated testosterone injections.  Endorses some fatigue, sleepiness and weight gain.  He is followed by neurology for some neuropathic pain.  He was recently switched from Lyrica to Cymbalta.  He thinks his fatigue is improving.  He currently feels well and is asymptomatic.  He cycles with friends every day if the weather is good.He denies any recent fevers or illnesses. He has a good appetite and denies weight loss.  He denies any chest pain, shortness of breath, cough, or hemoptysis.  He denies any nausea, vomiting, constipation, or diarrhea. He has no urinary complaints.  Patient offers no further specific complaints today.  REVIEW OF SYSTEMS:   Review of Systems  Constitutional: Positive for malaise/fatigue. Negative for fever and weight loss.  Respiratory: Negative.  Negative for cough and shortness of breath.   Cardiovascular: Negative.  Negative for chest pain and leg swelling.  Gastrointestinal: Negative.  Negative for abdominal pain, blood in stool and melena.  Genitourinary: Negative.  Negative for dysuria.   Musculoskeletal: Negative.  Negative for back pain, joint pain and neck pain.  Skin: Negative.  Negative for rash.  Neurological: Negative.  Negative for dizziness, tingling, sensory change, focal weakness, weakness and headaches.  Psychiatric/Behavioral: Negative.  The patient is not nervous/anxious.     As per HPI. Otherwise, a complete review of systems is negative.  PAST MEDICAL HISTORY: Past Medical History:  Diagnosis Date  . Arthritis   . Coronary artery disease   . Depression   . Hyperlipidemia     PAST SURGICAL HISTORY: Past Surgical History:  Procedure Laterality Date  . ANKLE FRACTURE SURGERY Right   . ARTHRODESIS ANT INTERBODY INC DISCECTOMY, CERVICAL BELOW C2   05/2019  . CARDIAC CATHETERIZATION N/A 04/26/2015   Procedure: Left Heart Cath;  Surgeon: Dionisio David, MD;  Location: [facility];  Service: Cardiovascular;  Laterality: N/A;  . CORONARY ANGIOPLASTY    . CORONARY STENT INTERVENTION N/A 03/27/2018   Procedure: CORONARY STENT INTERVENTION;  Surgeon: Yolonda Kida, MD;  Location: [facility];  Service: Cardiovascular;  Laterality: N/A;  . EYE SURGERY Bilateral    cataract  . LEFT HEART CATH AND CORONARY ANGIOGRAPHY Left 03/27/2018   Procedure: Left Heart Cath with possible coronary intervention;  Surgeon: Dionisio David, MD;  Location: [facility];  Service: Cardiovascular;  Laterality: Left;  . TOTAL KNEE ARTHROPLASTY Right 10/08/2018   Procedure: TOTAL KNEE ARTHROPLASTY;  Surgeon: Lovell [name], MD;  Location: [facility];  Service: Orthopedics;  Laterality: Right;    FAMILY HISTORY: Family History  Problem Relation Age of Onset  . Diabetes Mother   . Hypertension Mother   . Cancer Father   .  Depression Father   . Anxiety disorder Father   . Depression Daughter     ADVANCED DIRECTIVES (Y/N):  N  HEALTH MAINTENANCE: Social History   Tobacco Use  . Smoking status: Former Smoker    Years: 30.00    Types: Cigars     Quit date: 07/18/2006    Years since quitting: 14.3  . Smokeless tobacco: Never Used  Vaping Use  . Vaping Use: Never used  Substance Use Topics  . Alcohol use: No  . Drug use: No     Colonoscopy:  PAP:  Bone density:  Lipid panel:  Allergies  Allergen Reactions  . Clonidine Derivatives Shortness Of Breath and Other (See Comments)    Low heart rate  . Clonidine     Mild bradycardia + Somnolence    Current Outpatient Medications  Medication Sig Dispense Refill  . acetaminophen (TYLENOL) 500 MG tablet Take by mouth.    Marland Kitchen amLODipine (NORVASC) 5 MG tablet Take 1 tablet (5 mg total) by mouth daily. 90 tablet 1  . aspirin 81 MG EC tablet Take 1 tablet (81 mg total) by mouth daily. 90 tablet 3  . buPROPion (WELLBUTRIN) 100 MG tablet TAKE ONE TABLET EVERY DAY 90 tablet 1  . chlorthalidone (HYGROTON) 25 MG tablet     . Cholecalciferol 125 MCG (5000 UT) capsule Take 5,000 Units by mouth daily.    . clopidogrel (PLAVIX) 75 MG tablet Take 75 mg by mouth daily.    . clotrimazole-betamethasone (LOTRISONE) cream Apply 1 application topically 2 (two) times daily. 30 g 0  . diclofenac Sodium (VOLTAREN) 1 % GEL Voltaren 1 % topical gel  APPLY 2 GRAMS TO THE AFFECTED AREA(S) BY TOPICAL ROUTE 4 TIMES PER DAY    . dutasteride (AVODART) 0.5 MG capsule Take 1 capsule (0.5 mg total) by mouth every evening. 90 capsule 1  . escitalopram (LEXAPRO) 10 MG tablet TAKE 1 AND 1/2 TABLET AT BEDTIME 135 tablet 1  . fluticasone (FLONASE) 50 MCG/ACT nasal spray Place 2 sprays into both nostrils daily. 16 g 12  . hydrALAZINE (APRESOLINE) 100 MG tablet Take 50-100 mg by mouth See admin instructions. Take 50 mg by mouth in the morning and take 100 mg by mouth at bedtime    . hydrocortisone (ANUSOL-HC) 2.5 % rectal cream APPLY RECTALLY TWICE DAILY AS DIRECTED 30 g 0  . hydrocortisone 2.5 % cream as needed.    Marland Kitchen losartan (COZAAR) 100 MG tablet Take 1 tablet (100 mg total) by mouth daily. 90 tablet 1  . meloxicam  (MOBIC) 15 MG tablet Take 1 tablet (15 mg total) by mouth daily. 90 tablet 1  . methocarbamol (ROBAXIN) 500 MG tablet Take by mouth.    . metoprolol tartrate (LOPRESSOR) 25 MG tablet Take 25 mg by mouth daily.    . niacin (NIASPAN) 1000 MG CR tablet TAKE  2 TABLETS BY MOUTH AT BEDTIME 180 tablet 1  . Niacin CR 1000 MG TBCR Take by mouth.    . Omega-3 1000 MG CAPS Take by mouth.    . ondansetron (ZOFRAN-ODT) 4 MG disintegrating tablet     . pantoprazole (PROTONIX) 20 MG tablet Take 20 mg by mouth daily.     . pregabalin (LYRICA) 75 MG capsule 75 mg 3 (three) times daily.    . rosuvastatin (CRESTOR) 20 MG tablet Take 1 tablet (20 mg total) by mouth every evening. 90 tablet 1  . tamsulosin (FLOMAX) 0.4 MG CAPS capsule TAKE 1 CAPSULE BY MOUTH DAILY 90  capsule 1  . testosterone cypionate (DEPOTESTOSTERONE CYPIONATE) 200 MG/ML injection INJECT 0.75 ML EVERY 10 DAYS AS DIRECTED 10 mL 0  . tizanidine (ZANAFLEX) 2 MG capsule Take by mouth.    Marland Kitchen VASCEPA 1 g capsule     . vitamin B-12 (CYANOCOBALAMIN) 1000 MCG tablet Take 1,000 mcg by mouth daily.     No current facility-administered medications for this visit.    OBJECTIVE: Vitals:   11/21/20 1404  BP: 119/66  Pulse: 73  SpO2: 97%     There is no height or weight on file to calculate BMI.    ECOG FS:0 - Asymptomatic  Physical Exam Constitutional:      General: Vital signs are normal.     Appearance: Normal appearance.  HENT:     Head: Normocephalic and atraumatic.  Eyes:     Pupils: Pupils are equal, round, and reactive to light.  Cardiovascular:     Rate and Rhythm: Normal rate and regular rhythm.     Heart sounds: Normal heart sounds. No murmur heard.   Pulmonary:     Effort: Pulmonary effort is normal.     Breath sounds: Normal breath sounds. No wheezing.  Abdominal:     General: Bowel sounds are normal. There is no distension.     Palpations: Abdomen is soft.     Tenderness: There is no abdominal tenderness.  Musculoskeletal:         General: No edema. Normal range of motion.     Cervical back: Normal range of motion.  Skin:    General: Skin is warm and dry.     Findings: No rash.  Neurological:     Mental Status: He is alert and oriented to person, place, and time.  Psychiatric:        Judgment: Judgment normal.     LAB RESULTS:  Lab Results  Component Value Date   NA 140 08/15/2020   K 3.7 08/15/2020   CL 99 08/15/2020   CO2 24 08/15/2020   GLUCOSE 77 08/15/2020   BUN 17 08/15/2020   CREATININE 1.14 08/15/2020   CALCIUM 9.4 08/15/2020   PROT 6.5 08/15/2020   ALBUMIN 4.5 08/15/2020   AST 25 08/15/2020   ALT 22 08/15/2020   ALKPHOS 62 08/15/2020   BILITOT 0.4 08/15/2020   GFRNONAA 65 08/15/2020   GFRAA 75 08/15/2020    Lab Results  Component Value Date   WBC 5.3 11/21/2020   NEUTROABS 2.6 11/21/2020   HGB 16.9 11/21/2020   HCT 48.3 11/21/2020   MCV 89.8 11/21/2020   PLT 185 11/21/2020     STUDIES: No results found.  ASSESSMENT: Secondary polycythemia  PLAN:    1. 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   -Last phlebotomy was on 03/24/2020. -Hemoglobin goal ranges between 17 and 17.5. -Labs from 11/21/2020 show hemoglobin of 16.9 and a hematocrit of 48.3. -He will not require a phlebotomy today.  2. Low testosterone:  -She is followed by Dr. Bernardo Heater. -Dose recently decreased secondary to elevated testosterone levels.  And -Continue per urology.  3.  Neuropathic pain: -Recently switched from Lyrica to Cymbalta d/t increased sleepiness/drowsiness and fatigue. -Appears to be improving per patient.  Disposition: -No phlebotomy today. -RTC in 4 months with repeat labs, MD assessment and possible phlebotomy  Greater than 50% was spent in counseling and coordination of care with this patient including but not limited to discussion of the relevant topics above (See A&P) including, but not limited to diagnosis and management  of acute and chronic medical  conditions.   Patient expressed understanding and was in agreement with this plan. He also understands that He can call clinic at any time with any questions, concerns, or complaints.    Jacquelin Hawking, NP   11/21/2020 2:10 PM

## 2020-11-22 ENCOUNTER — Other Ambulatory Visit: Payer: Self-pay | Admitting: Family Medicine

## 2020-11-22 DIAGNOSIS — I10 Essential (primary) hypertension: Secondary | ICD-10-CM | POA: Diagnosis not present

## 2020-11-22 DIAGNOSIS — G4733 Obstructive sleep apnea (adult) (pediatric): Secondary | ICD-10-CM | POA: Diagnosis not present

## 2020-12-06 ENCOUNTER — Telehealth: Payer: Self-pay

## 2020-12-06 NOTE — Telephone Encounter (Signed)
Returned call to patient.  Patient reports he was taken off of the Lyrica and started on Cymbalta recently.  He wonders whether he can come off of the Lexapro since he is on the Cymbalta.  Discussed to reduce Lexapro to 5 mg daily for the next 10 days and stop taking it.  He does report nausea from the Cymbalta.  Advised to take Cymbalta with the snack or a meal.  If he continues to have nausea and if he plans to get off of Cymbalta discussed to restart taking Lexapro.  Patient to call back with concerns as needed.

## 2020-12-06 NOTE — Telephone Encounter (Signed)
Dr. Jose Persia at [facility] medication dept. and she took him off the lyricia and put him on cymbalta and he thinks it is inter with his other medications.  wanted to speak with you about medication issues.

## 2020-12-07 DIAGNOSIS — M48061 Spinal stenosis, lumbar region without neurogenic claudication: Secondary | ICD-10-CM | POA: Diagnosis not present

## 2020-12-07 DIAGNOSIS — M5416 Radiculopathy, lumbar region: Secondary | ICD-10-CM | POA: Diagnosis not present

## 2020-12-13 DIAGNOSIS — I10 Essential (primary) hypertension: Secondary | ICD-10-CM | POA: Diagnosis not present

## 2020-12-13 DIAGNOSIS — R0602 Shortness of breath: Secondary | ICD-10-CM | POA: Diagnosis not present

## 2020-12-13 DIAGNOSIS — I2510 Atherosclerotic heart disease of native coronary artery without angina pectoris: Secondary | ICD-10-CM | POA: Diagnosis not present

## 2020-12-13 DIAGNOSIS — E785 Hyperlipidemia, unspecified: Secondary | ICD-10-CM | POA: Diagnosis not present

## 2020-12-20 DIAGNOSIS — Z79899 Other long term (current) drug therapy: Secondary | ICD-10-CM | POA: Diagnosis not present

## 2020-12-20 DIAGNOSIS — M79675 Pain in left toe(s): Secondary | ICD-10-CM | POA: Diagnosis not present

## 2020-12-20 DIAGNOSIS — M79674 Pain in right toe(s): Secondary | ICD-10-CM | POA: Diagnosis not present

## 2020-12-20 DIAGNOSIS — B351 Tinea unguium: Secondary | ICD-10-CM | POA: Diagnosis not present

## 2020-12-23 ENCOUNTER — Encounter: Payer: Self-pay | Admitting: Oncology

## 2020-12-26 ENCOUNTER — Other Ambulatory Visit: Payer: Self-pay | Admitting: Family Medicine

## 2020-12-26 DIAGNOSIS — I10 Essential (primary) hypertension: Secondary | ICD-10-CM

## 2021-01-02 DIAGNOSIS — M5416 Radiculopathy, lumbar region: Secondary | ICD-10-CM | POA: Diagnosis not present

## 2021-01-03 ENCOUNTER — Telehealth: Payer: Self-pay | Admitting: Psychiatry

## 2021-01-03 NOTE — Telephone Encounter (Signed)
Returned call to patient. He reports his orthopedic provider is interested in increasing Cymbalta to 60 mg however he wanted to run it by writer first before making further medication changes. He is no longer taking Lexapro. Discussed with patient that it is okay to increase the Cymbalta to 60 mg for pain management. Advised to monitor for side effects, drug to drug interaction with his medications including Wellbutrin. Patient advised to reach out to was if he has any concerns or questions.  Patient reports he will let his orthopedic provider know to increase the dosage.

## 2021-01-04 ENCOUNTER — Other Ambulatory Visit: Payer: Self-pay | Admitting: Family Medicine

## 2021-01-04 DIAGNOSIS — E782 Mixed hyperlipidemia: Secondary | ICD-10-CM

## 2021-01-11 ENCOUNTER — Other Ambulatory Visit: Payer: Self-pay | Admitting: Family Medicine

## 2021-01-11 DIAGNOSIS — R972 Elevated prostate specific antigen [PSA]: Secondary | ICD-10-CM

## 2021-01-11 DIAGNOSIS — R350 Frequency of micturition: Secondary | ICD-10-CM

## 2021-01-11 DIAGNOSIS — N401 Enlarged prostate with lower urinary tract symptoms: Secondary | ICD-10-CM

## 2021-01-11 NOTE — Telephone Encounter (Signed)
Requested Prescriptions  Pending Prescriptions Disp Refills  . tamsulosin (FLOMAX) 0.4 MG CAPS capsule [Pharmacy Med Name: TAMSULOSIN HCL 0.4 MG CAP] 90 capsule 1    Sig: TAKE 1 CAPSULE BY MOUTH DAILY     Urology: Alpha-Adrenergic Blocker Passed - 01/11/2021  9:18 AM      Passed - Last BP in normal range    BP Readings from Last 1 Encounters:  11/21/20 119/66         Passed - Valid encounter within last 12 months    Recent Outpatient Visits          4 months ago Essential hypertension   [place], [name], DO   11 months ago Routine general medical examination at a health care facility   [facility], [place], DO   1 year ago Essential hypertension   [place], Mark A, MD   2 years ago Need for pneumococcal vaccination   [facility] Guadalupe Maple, MD   2 years ago Hypokalemia   [place], MD      Future Appointments            In 1 month Johnson, [name], DO [place], [place]   In 4 months Heath, [name], [place]

## 2021-01-16 DIAGNOSIS — M5416 Radiculopathy, lumbar region: Secondary | ICD-10-CM | POA: Diagnosis not present

## 2021-01-28 ENCOUNTER — Other Ambulatory Visit: Payer: Self-pay | Admitting: Urology

## 2021-02-09 ENCOUNTER — Encounter: Payer: Self-pay | Admitting: Psychiatry

## 2021-02-09 ENCOUNTER — Other Ambulatory Visit: Payer: Self-pay

## 2021-02-09 ENCOUNTER — Telehealth (INDEPENDENT_AMBULATORY_CARE_PROVIDER_SITE_OTHER): Payer: Medicare PPO | Admitting: Psychiatry

## 2021-02-09 DIAGNOSIS — Z634 Disappearance and death of family member: Secondary | ICD-10-CM

## 2021-02-09 DIAGNOSIS — G4701 Insomnia due to medical condition: Secondary | ICD-10-CM

## 2021-02-09 DIAGNOSIS — F5105 Insomnia due to other mental disorder: Secondary | ICD-10-CM

## 2021-02-09 DIAGNOSIS — F3342 Major depressive disorder, recurrent, in full remission: Secondary | ICD-10-CM | POA: Diagnosis not present

## 2021-02-09 DIAGNOSIS — F411 Generalized anxiety disorder: Secondary | ICD-10-CM

## 2021-02-09 MED ORDER — BUPROPION HCL 100 MG PO TABS: 100.0000 mg | ORAL_TABLET | Freq: Every day | ORAL | 1 refills | Status: DC

## 2021-02-09 MED ORDER — DULOXETINE HCL 20 MG PO CPEP: 40.0000 mg | ORAL_CAPSULE | Freq: Every day | ORAL | 1 refills | Status: DC

## 2021-02-09 NOTE — Progress Notes (Signed)
Virtual Visit via Video Note  I connected with Kenneth Pruitt on 02/09/21 at 10:20 AM EDT by a video enabled telemedicine application and verified that I am speaking with the correct person using two identifiers.  Location Provider Location : [facility] Patient Location : Home  Participants: Patient , Provider    I discussed the limitations of evaluation and management by telemedicine and the availability of in person appointments. The patient expressed understanding and agreed to proceed.    I discussed the assessment and treatment plan with the patient. The patient was provided an opportunity to ask questions and all were answered. The patient agreed with the plan and demonstrated an understanding of the instructions.   The patient was advised to call back or seek an in-person evaluation if the symptoms worsen or if the condition fails to improve as anticipated.   [place] MD OP Progress Note  02/09/2021 10:43 AM Kenneth Pruitt  MRN:  [id number]  Chief Complaint:  Chief Complaint    Follow-up; Anxiety; Depression     HPI: Kenneth Pruitt is a 70 year old Caucasian male, retired, married, lives in [place], has a history of MDD, GAD, insomnia, coronary artery disease, stent placement, knee pain, testosterone deficiency was evaluated by telemedicine today.  Patient today reports he continues to struggle with right foot pain.  He reports it is a burning pain which does have an impact on his sleep and functioning.  He however reports the Cymbalta does help.  He also tries to lay in a certain position and puts pressure on that foot and that helps him to sleep through the night.  Patient reports he also pushes through pain and is able to function.  He continues to follow-up with his providers.  Patient reports he is currently grieving the loss of his mother.  He reports he was able to take a break and was able to spend some time with family and friends.  That did help.  However there are days  when he is anxious and sad.  Patient denies any suicidality, homicidality or perceptual disturbances.  Patient denies any side effects to Cymbalta.  Patient denies any other concerns today.  Visit Diagnosis:    ICD-10-CM   1. MDD (major depressive disorder), recurrent, in full remission ([place])  F33.42 DULoxetine (CYMBALTA) 20 MG capsule    buPROPion (WELLBUTRIN) 100 MG tablet  2. GAD (generalized anxiety disorder)  F41.1 DULoxetine (CYMBALTA) 20 MG capsule  3. Insomnia due to medical condition  G47.01 DULoxetine (CYMBALTA) 20 MG capsule   depression, pain  4. Bereavement  Z63.4     Past Psychiatric History: I have reviewed past psychiatric history from progress note on 12/16/2017  Past Medical History:  Past Medical History:  Diagnosis Date  . Arthritis   . Coronary artery disease   . Depression   . Hyperlipidemia     Past Surgical History:  Procedure Laterality Date  . ANKLE FRACTURE SURGERY Right   . ARTHRODESIS ANT INTERBODY INC DISCECTOMY, CERVICAL BELOW C2   05/2019  . CARDIAC CATHETERIZATION N/A 04/26/2015   Procedure: Left Heart Cath;  Surgeon: Dionisio David, MD;  Location: [facility];  Service: Cardiovascular;  Laterality: N/A;  . CORONARY ANGIOPLASTY    . CORONARY STENT INTERVENTION N/A 03/27/2018   Procedure: CORONARY STENT INTERVENTION;  Surgeon: Yolonda Kida, MD;  Location: [facility];  Service: Cardiovascular;  Laterality: N/A;  . EYE SURGERY Bilateral    cataract  . LEFT HEART CATH  AND CORONARY ANGIOGRAPHY Left 03/27/2018   Procedure: Left Heart Cath with possible coronary intervention;  Surgeon: Dionisio David, MD;  Location: [facility];  Service: Cardiovascular;  Laterality: Left;  . TOTAL KNEE ARTHROPLASTY Right 10/08/2018   Procedure: TOTAL KNEE ARTHROPLASTY;  Surgeon: Lovell [name], MD;  Location: [facility];  Service: Orthopedics;  Laterality: Right;    Family Psychiatric History: Reviewed family psychiatric history from  progress note on 12/16/2017  Family History:  Family History  Problem Relation Age of Onset  . Diabetes Mother   . Hypertension Mother   . Cancer Father   . Depression Father   . Anxiety disorder Father   . Depression Daughter     Social History: Reviewed social history from progress note on 12/16/2017 Social History   Socioeconomic History  . Marital status: Married    Spouse name: Kenneth Pruitt  . Number of children: 2  . Years of education: Not on file  . Highest education level: Master's degree (e.g., MA, MS, MEng, MEd, MSW, MBA)  Occupational History    Comment: retired  Tobacco Use  . Smoking status: Former Smoker    Years: 30.00    Types: Cigars    Quit date: 07/18/2006    Years since quitting: 14.5  . Smokeless tobacco: Never Used  Vaping Use  . Vaping Use: Never used  Substance and Sexual Activity  . Alcohol use: No  . Drug use: No  . Sexual activity: Not Currently  Other Topics Concern  . Not on file  Social History Narrative  . Not on file   Social Determinants of Health   Financial Resource Strain: Not on file  Food Insecurity: Not on file  Transportation Needs: Not on file  Physical Activity: Not on file  Stress: Not on file  Social Connections: Not on file    Allergies:  Allergies  Allergen Reactions  . Clonidine Derivatives Shortness Of Breath and Other (See Comments)    Low heart rate  . Clonidine     Mild bradycardia + Somnolence    Metabolic Disorder Labs: Lab Results  Component Value Date   HGBA1C 5.5 08/15/2020   No results found for: PROLACTIN Lab Results  Component Value Date   CHOL 99 (L) 08/15/2020   TRIG 138 08/15/2020   HDL 35 (L) 08/15/2020   CHOLHDL 2.5 08/20/2019   VLDL 37 03/28/2018   LDLCALC 40 08/15/2020   LDLCALC 31 02/12/2020   Lab Results  Component Value Date   TSH 0.877 02/12/2020   TSH 1.150 11/26/2018    Therapeutic Level Labs: No results found for: LITHIUM No results found for: VALPROATE No components found  for:  CBMZ  Current Medications: Current Outpatient Medications  Medication Sig Dispense Refill  . DULoxetine (CYMBALTA) 20 MG capsule Take 2 capsules (40 mg total) by mouth daily. 60 capsule 1  . acetaminophen (TYLENOL) 500 MG tablet Take by mouth.    Marland Kitchen amLODipine (NORVASC) 5 MG tablet TAKE ONE TABLET (5 MG) BY MOUTH EVERY DAY 90 tablet 1  . aspirin 81 MG EC tablet Take 1 tablet (81 mg total) by mouth daily. 90 tablet 3  . buPROPion (WELLBUTRIN) 100 MG tablet Take 1 tablet (100 mg total) by mouth daily. 90 tablet 1  . chlorthalidone (HYGROTON) 25 MG tablet     . Cholecalciferol 125 MCG (5000 UT) capsule Take 5,000 Units by mouth daily.    . clopidogrel (PLAVIX) 75 MG tablet Take 75 mg by mouth daily.    Marland Kitchen  clotrimazole-betamethasone (LOTRISONE) cream Apply 1 application topically 2 (two) times daily. 30 g 0  . diclofenac Sodium (VOLTAREN) 1 % GEL Voltaren 1 % topical gel  APPLY 2 GRAMS TO THE AFFECTED AREA(S) BY TOPICAL ROUTE 4 TIMES PER DAY    . dutasteride (AVODART) 0.5 MG capsule Take 1 capsule (0.5 mg total) by mouth every evening. 90 capsule 1  . fluticasone (FLONASE) 50 MCG/ACT nasal spray Place 2 sprays into both nostrils daily. 16 g 12  . hydrALAZINE (APRESOLINE) 100 MG tablet Take 50-100 mg by mouth See admin instructions. Take 50 mg by mouth in the morning and take 100 mg by mouth at bedtime    . hydrocortisone (ANUSOL-HC) 2.5 % rectal cream APPLY RECTALLY TWICE DAILY AS DIRECTED 30 g 0  . hydrocortisone 2.5 % cream as needed.    Marland Kitchen losartan (COZAAR) 100 MG tablet TAKE ONE TABLET (100 MG) BY MOUTH EVERY DAY 90 tablet 0  . meloxicam (MOBIC) 15 MG tablet Take 1 tablet (15 mg total) by mouth daily. 90 tablet 1  . methocarbamol (ROBAXIN) 500 MG tablet Take by mouth.    . metoprolol tartrate (LOPRESSOR) 25 MG tablet Take 25 mg by mouth daily.    . niacin (NIASPAN) 1000 MG CR tablet TAKE  2 TABLETS BY MOUTH AT BEDTIME 180 tablet 1  . Niacin CR 1000 MG TBCR Take by mouth.    . Omega-3  1000 MG CAPS Take by mouth.    . ondansetron (ZOFRAN-ODT) 4 MG disintegrating tablet     . pantoprazole (PROTONIX) 20 MG tablet Take 20 mg by mouth daily.     . rosuvastatin (CRESTOR) 20 MG tablet Take 1 tablet (20 mg total) by mouth every evening. 90 tablet 1  . tamsulosin (FLOMAX) 0.4 MG CAPS capsule TAKE 1 CAPSULE BY MOUTH DAILY 90 capsule 1  . testosterone cypionate (DEPOTESTOSTERONE CYPIONATE) 200 MG/ML injection INJECT 0.75 ML EVERY 10 DAYS AS DIRECTED 10 mL 0  . tizanidine (ZANAFLEX) 2 MG capsule Take by mouth.    Marland Kitchen VASCEPA 1 g capsule     . vitamin B-12 (CYANOCOBALAMIN) 1000 MCG tablet Take 1,000 mcg by mouth daily.     No current facility-administered medications for this visit.     Musculoskeletal: Strength & Muscle Tone: UTA Gait & Station: UTA Patient leans: N/A  Psychiatric Specialty Exam: Review of Systems  Musculoskeletal:       Rt.sided foot pain - burning  Psychiatric/Behavioral: Positive for sleep disturbance. The patient is nervous/anxious.   All other systems reviewed and are negative.   There were no vitals taken for this visit.There is no height or weight on file to calculate BMI.  General Appearance: Casual  Eye Contact:  Fair  Speech:  Normal Rate  Volume:  Normal  Mood:  Anxious  Affect:  Congruent  Thought Process:  Goal Directed and Descriptions of Associations: Intact  Orientation:  Full (Time, Place, and Person)  Thought Content: Logical   Suicidal Thoughts:  No  Homicidal Thoughts:  No  Memory:  Immediate;   Fair Recent;   Fair Remote;   Fair  Judgement:  Fair  Insight:  Fair  Psychomotor Activity:  Normal  Concentration:  Concentration: Fair and Attention Span: Fair  Recall:  [employer] of Knowledge: Fair  Language: Fair  Akathisia:  No  Handed:  Right  AIMS (if indicated): UTA  Assets:  Communication Skills Desire for Improvement Housing Social Support  ADL's:  Intact  Cognition: WNL  Sleep:  Restless   Screenings: GAD-7    Flowsheet Row Video Visit from 02/09/2021 in [facility]  Total GAD-7 Score 4    PHQ2-9   Flowsheet Row Video Visit from 02/09/2021 in [facility] Office Visit from 08/15/2020 in [facility] from 04/30/2019 in [facility] Visit from 11/26/2018 in [facility] from 10/11/2017 in [facility]  PHQ-2 Total Score 0 3 0 0 6  PHQ-9 Total Score 4 11 -- 0 24       Assessment and Plan: Kenneth Pruitt is a 70 year old Caucasian male, has a history of MDD, anxiety disorder, multiple medical problems including essential hypertension, coronary artery disease status post stent placement, hyperlipidemia, BPH, chronic pain, testosterone deficiency was evaluated by telemedicine today.  Patient is currently struggling with pain which does have an impact on his sleep and also is grieving the loss of his mother.  Discussed plan as noted below.  Plan MDD in remission Wellbutrin 100 mg p.o. daily  GAD-stable Cymbalta as prescribed Hydroxyzine 25 mg p.o. 3 times daily as needed for anxiety attacks  Insomnia-unstable Increase Cymbalta to 40 mg p.o. daily to address his pain which does have an impact on his sleep. Patient to work on sleep hygiene techniques  Bereavement- improving Provided grief counseling  Follow-up in clinic in 2 months or sooner if needed.  This note was generated in part or whole with voice recognition software. Voice recognition is usually quite accurate but there are transcription errors that can and very often do occur. I apologize for any typographical errors that were not detected and corrected.        Kenneth Alert, MD 02/10/2021, 11:18 AM

## 2021-02-14 ENCOUNTER — Ambulatory Visit: Payer: Medicare PPO | Admitting: Family Medicine

## 2021-02-14 DIAGNOSIS — Z79899 Other long term (current) drug therapy: Secondary | ICD-10-CM | POA: Diagnosis not present

## 2021-02-14 DIAGNOSIS — B351 Tinea unguium: Secondary | ICD-10-CM | POA: Diagnosis not present

## 2021-02-20 ENCOUNTER — Other Ambulatory Visit: Payer: Self-pay | Admitting: Family Medicine

## 2021-02-20 DIAGNOSIS — I10 Essential (primary) hypertension: Secondary | ICD-10-CM

## 2021-02-20 DIAGNOSIS — G4733 Obstructive sleep apnea (adult) (pediatric): Secondary | ICD-10-CM | POA: Diagnosis not present

## 2021-02-21 ENCOUNTER — Ambulatory Visit: Payer: Medicare PPO | Admitting: Family Medicine

## 2021-02-21 ENCOUNTER — Other Ambulatory Visit: Payer: Self-pay

## 2021-02-21 ENCOUNTER — Encounter: Payer: Self-pay | Admitting: Family Medicine

## 2021-02-21 VITALS — BP 127/74 | HR 92 | Temp 98.8°F | Ht 73.0 in | Wt 216.0 lb

## 2021-02-21 DIAGNOSIS — F411 Generalized anxiety disorder: Secondary | ICD-10-CM | POA: Diagnosis not present

## 2021-02-21 DIAGNOSIS — R350 Frequency of micturition: Secondary | ICD-10-CM

## 2021-02-21 DIAGNOSIS — N183 Chronic kidney disease, stage 3 unspecified: Secondary | ICD-10-CM | POA: Diagnosis not present

## 2021-02-21 DIAGNOSIS — F3342 Major depressive disorder, recurrent, in full remission: Secondary | ICD-10-CM

## 2021-02-21 DIAGNOSIS — E782 Mixed hyperlipidemia: Secondary | ICD-10-CM | POA: Diagnosis not present

## 2021-02-21 DIAGNOSIS — N401 Enlarged prostate with lower urinary tract symptoms: Secondary | ICD-10-CM | POA: Diagnosis not present

## 2021-02-21 DIAGNOSIS — D751 Secondary polycythemia: Secondary | ICD-10-CM | POA: Diagnosis not present

## 2021-02-21 DIAGNOSIS — I10 Essential (primary) hypertension: Secondary | ICD-10-CM | POA: Diagnosis not present

## 2021-02-21 DIAGNOSIS — R972 Elevated prostate specific antigen [PSA]: Secondary | ICD-10-CM | POA: Diagnosis not present

## 2021-02-21 LAB — URINALYSIS, ROUTINE W REFLEX MICROSCOPIC
Bilirubin, UA: NEGATIVE
Glucose, UA: NEGATIVE
Ketones, UA: NEGATIVE
Leukocytes,UA: NEGATIVE
Nitrite, UA: NEGATIVE
Protein,UA: NEGATIVE
RBC, UA: NEGATIVE
Specific Gravity, UA: 1.02 (ref 1.005–1.030)
Urobilinogen, Ur: 2 mg/dL — ABNORMAL HIGH (ref 0.2–1.0)
pH, UA: 6.5 (ref 5.0–7.5)

## 2021-02-21 MED ORDER — MELOXICAM 15 MG PO TABS: 15.0000 mg | ORAL_TABLET | Freq: Every day | ORAL | 1 refills | Status: DC

## 2021-02-21 MED ORDER — AMLODIPINE BESYLATE 5 MG PO TABS: ORAL_TABLET | ORAL | 1 refills | Status: DC

## 2021-02-21 MED ORDER — LOSARTAN POTASSIUM 100 MG PO TABS: ORAL_TABLET | ORAL | 1 refills | Status: DC

## 2021-02-21 MED ORDER — NIACIN ER (ANTIHYPERLIPIDEMIC) 1000 MG PO TBCR: 2000.0000 mg | EXTENDED_RELEASE_TABLET | Freq: Every day | ORAL | 1 refills | Status: DC

## 2021-02-21 MED ORDER — TAMSULOSIN HCL 0.4 MG PO CAPS: 0.4000 mg | ORAL_CAPSULE | Freq: Every day | ORAL | 1 refills | Status: DC

## 2021-02-21 MED ORDER — ROSUVASTATIN CALCIUM 20 MG PO TABS
20.0000 mg | ORAL_TABLET | Freq: Every evening | ORAL | 1 refills | Status: DC
Start: 2021-02-21 — End: 2021-08-31

## 2021-02-21 MED ORDER — DUTASTERIDE 0.5 MG PO CAPS: 0.5000 mg | ORAL_CAPSULE | Freq: Every evening | ORAL | 1 refills | Status: DC

## 2021-02-22 LAB — COMPREHENSIVE METABOLIC PANEL
ALT: 27 IU/L (ref 0–44)
AST: 34 IU/L (ref 0–40)
Albumin/Globulin Ratio: 1.9 (ref 1.2–2.2)
Albumin: 4.2 g/dL (ref 3.8–4.8)
Alkaline Phosphatase: 55 IU/L (ref 44–121)
BUN/Creatinine Ratio: 18 (ref 10–24)
BUN: 21 mg/dL (ref 8–27)
Bilirubin Total: 0.4 mg/dL (ref 0.0–1.2)
CO2: 21 mmol/L (ref 20–29)
Calcium: 9.1 mg/dL (ref 8.6–10.2)
Chloride: 100 mmol/L (ref 96–106)
Creatinine, Ser: 1.2 mg/dL (ref 0.76–1.27)
Globulin, Total: 2.2 g/dL (ref 1.5–4.5)
Glucose: 150 mg/dL — ABNORMAL HIGH (ref 65–99)
Potassium: 3.2 mmol/L — ABNORMAL LOW (ref 3.5–5.2)
Sodium: 139 mmol/L (ref 134–144)
Total Protein: 6.4 g/dL (ref 6.0–8.5)
eGFR: 65 mL/min/{1.73_m2} (ref >59)

## 2021-02-22 LAB — LIPID PANEL W/O CHOL/HDL RATIO
Cholesterol, Total: 95 mg/dL — ABNORMAL LOW (ref 100–199)
HDL: 39 mg/dL — ABNORMAL LOW (ref >39)
LDL Chol Calc (NIH): 24 mg/dL (ref 0–99)
Triglycerides: 200 mg/dL — ABNORMAL HIGH (ref 0–149)
VLDL Cholesterol Cal: 32 mg/dL (ref 5–40)

## 2021-02-22 LAB — CBC WITH DIFFERENTIAL/PLATELET
Basophils Absolute: 0 10*3/uL (ref 0.0–0.2)
Basos: 1 %
EOS (ABSOLUTE): 0.1 10*3/uL (ref 0.0–0.4)
Eos: 1 %
Hematocrit: 47.1 % (ref 37.5–51.0)
Hemoglobin: 16.7 g/dL (ref 13.0–17.7)
Immature Grans (Abs): 0 10*3/uL (ref 0.0–0.1)
Immature Granulocytes: 0 %
Lymphocytes Absolute: 0.7 10*3/uL (ref 0.7–3.1)
Lymphs: 12 %
MCH: 32.3 pg (ref 26.6–33.0)
MCHC: 35.5 g/dL (ref 31.5–35.7)
MCV: 91 fL (ref 79–97)
Monocytes Absolute: 0.5 10*3/uL (ref 0.1–0.9)
Monocytes: 8 %
Neutrophils Absolute: 4.6 10*3/uL (ref 1.4–7.0)
Neutrophils: 78 %
Platelets: 198 10*3/uL (ref 150–450)
RBC: 5.17 x10E6/uL (ref 4.14–5.80)
RDW: 13.1 % (ref 11.6–15.4)
WBC: 5.9 10*3/uL (ref 3.4–10.8)

## 2021-02-22 LAB — TSH: TSH: 1.02 u[IU]/mL (ref 0.450–4.500)

## 2021-02-22 LAB — PSA: Prostate Specific Ag, Serum: 2.5 ng/mL (ref 0.0–4.0)

## 2021-02-23 NOTE — Assessment & Plan Note (Signed)
Rechecking labs today. Await results. Treat as needed.  °

## 2021-02-23 NOTE — Assessment & Plan Note (Signed)
Under good control on current regimen. Continue current regimen. Continue to monitor. Call with any concerns. Refills given. Labs drawn today.   

## 2021-02-23 NOTE — Assessment & Plan Note (Signed)
Not under great control. Following with psychiatry. Continue to follow with psychiatry. Call with any concerns.

## 2021-02-23 NOTE — Progress Notes (Signed)
BP 127/74   Pulse 92   Temp 98.8 F (37.1 C)   Ht 6\' 1"  (1.854 m)   Wt 216 lb (98 kg)   SpO2 96%   BMI 28.50 kg/m    Subjective:    Patient ID: Kenneth Pruitt, male    DOB: September 23, 1950, 70 y.o.   MRN: [id number]  HPI: Kenneth Pruitt is a 70 y.o. male  Chief Complaint  Patient presents with   Hypertension   Hyperlipidemia   Anxiety   Chronic Kidney Disease   Depression   HYPERTENSION / [place] Satisfied with current treatment? yes Duration of hypertension: chronic BP monitoring frequency: not checking BP medication side effects: no Duration of hyperlipidemia: chronic Cholesterol medication side effects: no Cholesterol supplements: niacin, fish oil Past cholesterol medications: crestor, vescepa Medication compliance: excellent compliance Aspirin: yes Recent stressors: yes Recurrent headaches: no Visual changes: no Palpitations: no Dyspnea: no Chest pain: no Lower extremity edema: no Dizzy/lightheaded: no  DEPRESSION- follows with psychiatry. Has been acting up a bit. Just got his medicine increased Mood status: uncontrolled Satisfied with current treatment?: no Symptom severity: moderate  Duration of current treatment :  just had his medicine increased Side effects: no Medication compliance: excellent compliance Psychotherapy/counseling: no  Depressed mood: yes Anxious mood: yes Anhedonia: no Significant weight loss or gain: no Insomnia: yes  Fatigue: yes Feelings of worthlessness or guilt: yes Impaired concentration/indecisiveness: yes Suicidal ideations: no Hopelessness: no Crying spells: no Depression screen [facility] 2/9 02/21/2021 08/15/2020 04/30/2019 11/26/2018 10/11/2017  Decreased Interest 1 0 0 0 3  Down, Depressed, Hopeless 1 3 0 0 3  PHQ - 2 Score 2 3 0 0 6  Altered sleeping 1 0 - 0 2  Tired, decreased energy 3 3 - 0 3  Change in appetite 0 2 - 0 3  Feeling bad or failure about yourself  1 3 - 0 3  Trouble concentrating 1 0 - 0 3   Moving slowly or fidgety/restless 0 0 - 0 2  Suicidal thoughts 0 0 - 0 2  PHQ-9 Score 8 11 - 0 24  Difficult doing work/chores Somewhat difficult - - Not difficult at all Somewhat difficult  Some encounter information is confidential and restricted. Go to Review Flowsheets activity to see all data.    Relevant past medical, surgical, family and social history reviewed and updated as indicated. Interim medical history since our last visit reviewed. Allergies and medications reviewed and updated.  Review of Systems  Constitutional: Negative.   Respiratory: Negative.    Cardiovascular: Negative.   Gastrointestinal: Negative.   Musculoskeletal: Negative.   Skin: Negative.   Neurological: Negative.   Psychiatric/Behavioral:  Positive for dysphoric mood. Negative for agitation, behavioral problems, confusion, decreased concentration, hallucinations, self-injury, sleep disturbance and suicidal ideas. The patient is not nervous/anxious and is not hyperactive.    Per HPI unless specifically indicated above     Objective:    BP 127/74   Pulse 92   Temp 98.8 F (37.1 C)   Ht 6\' 1"  (1.854 m)   Wt 216 lb (98 kg)   SpO2 96%   BMI 28.50 kg/m   Wt Readings from Last 3 Encounters:  02/21/21 216 lb (98 kg)  08/15/20 224 lb (101.6 kg)  07/25/20 225 lb 8 oz (102.3 kg)    Physical Exam Vitals and nursing note reviewed.  Constitutional:      General: He is not in acute distress.    Appearance: Normal appearance. He is not  ill-appearing, toxic-appearing or diaphoretic.  HENT:     Head: Normocephalic and atraumatic.     Right Ear: External ear normal.     Left Ear: External ear normal.     Nose: Nose normal.     Mouth/Throat:     Mouth: Mucous membranes are moist.     Pharynx: Oropharynx is clear.  Eyes:     General: No scleral icterus.       Right eye: No discharge.        Left eye: No discharge.     Extraocular Movements: Extraocular movements intact.     Conjunctiva/sclera:  Conjunctivae normal.     Pupils: Pupils are equal, round, and reactive to light.  Cardiovascular:     Rate and Rhythm: Normal rate and regular rhythm.     Pulses: Normal pulses.     Heart sounds: Normal heart sounds. No murmur heard.   No friction rub. No gallop.  Pulmonary:     Effort: Pulmonary effort is normal. No respiratory distress.     Breath sounds: Normal breath sounds. No stridor. No wheezing, rhonchi or rales.  Chest:     Chest wall: No tenderness.  Musculoskeletal:        General: Normal range of motion.     Cervical back: Normal range of motion and neck supple.  Skin:    General: Skin is warm and dry.     Capillary Refill: Capillary refill takes less than 2 seconds.     Coloration: Skin is not jaundiced or pale.     Findings: No bruising, erythema, lesion or rash.  Neurological:     General: No focal deficit present.     Mental Status: He is alert and oriented to person, place, and time. Mental status is at baseline.  Psychiatric:        Mood and Affect: Mood normal.        Behavior: Behavior normal.        Thought Content: Thought content normal.        Judgment: Judgment normal.    Results for orders placed or performed in visit on 02/21/21  CBC with Differential/Platelet  Result Value Ref Range   WBC 5.9 3.4 - 10.8 x10E3/uL   RBC 5.17 4.14 - 5.80 x10E6/uL   Hemoglobin 16.7 13.0 - 17.7 g/dL   Hematocrit 47.1 37.5 - 51.0 %   MCV 91 79 - 97 fL   MCH 32.3 26.6 - 33.0 pg   MCHC 35.5 31.5 - 35.7 g/dL   RDW 13.1 11.6 - 15.4 %   Platelets 198 150 - 450 x10E3/uL   Neutrophils 78 Not Estab. %   Lymphs 12 Not Estab. %   Monocytes 8 Not Estab. %   Eos 1 Not Estab. %   Basos 1 Not Estab. %   Neutrophils Absolute 4.6 1.4 - 7.0 x10E3/uL   Lymphocytes Absolute 0.7 0.7 - 3.1 x10E3/uL   Monocytes Absolute 0.5 0.1 - 0.9 x10E3/uL   EOS (ABSOLUTE) 0.1 0.0 - 0.4 x10E3/uL   Basophils Absolute 0.0 0.0 - 0.2 x10E3/uL   Immature Granulocytes 0 Not Estab. %   Immature Grans  (Abs) 0.0 0.0 - 0.1 x10E3/uL  Comprehensive metabolic panel  Result Value Ref Range   Glucose 150 (H) 65 - 99 mg/dL   BUN 21 8 - 27 mg/dL   Creatinine, Ser 1.20 0.76 - 1.27 mg/dL   eGFR 65 >59 mL/min/1.73   BUN/Creatinine Ratio 18 10 - 24   Sodium 139 134 -  144 mmol/L   Potassium 3.2 (L) 3.5 - 5.2 mmol/L   Chloride 100 96 - 106 mmol/L   CO2 21 20 - 29 mmol/L   Calcium 9.1 8.6 - 10.2 mg/dL   Total Protein 6.4 6.0 - 8.5 g/dL   Albumin 4.2 3.8 - 4.8 g/dL   Globulin, Total 2.2 1.5 - 4.5 g/dL   Albumin/Globulin Ratio 1.9 1.2 - 2.2   Bilirubin Total 0.4 0.0 - 1.2 mg/dL   Alkaline Phosphatase 55 44 - 121 IU/L   AST 34 0 - 40 IU/L   ALT 27 0 - 44 IU/L  Lipid Panel w/o Chol/HDL Ratio  Result Value Ref Range   Cholesterol, Total 95 (L) 100 - 199 mg/dL   Triglycerides 200 (H) 0 - 149 mg/dL   HDL 39 (L) >39 mg/dL   VLDL Cholesterol Cal 32 5 - 40 mg/dL   LDL Chol Calc (NIH) 24 0 - 99 mg/dL  PSA  Result Value Ref Range   Prostate Specific Ag, Serum 2.5 0.0 - 4.0 ng/mL  Urinalysis, Routine w reflex microscopic  Result Value Ref Range   Specific Gravity, UA 1.020 1.005 - 1.030   pH, UA 6.5 5.0 - 7.5   Color, UA Yellow Yellow   Appearance Ur Clear Clear   Leukocytes,UA Negative Negative   Protein,UA Negative Negative/Trace   Glucose, UA Negative Negative   Ketones, UA Negative Negative   RBC, UA Negative Negative   Bilirubin, UA Negative Negative   Urobilinogen, Ur 2.0 (H) 0.2 - 1.0 mg/dL   Nitrite, UA Negative Negative  TSH  Result Value Ref Range   TSH 1.020 0.450 - 4.500 uIU/mL      Assessment & Plan:   Problem List Items Addressed This Visit       Cardiovascular and Mediastinum   Essential hypertension - Primary    Under good control on current regimen. Continue current regimen. Continue to monitor. Call with any concerns. Refills given. Labs drawn today.        Relevant Medications   rosuvastatin (CRESTOR) 20 MG tablet   niacin (NIASPAN) 1000 MG CR tablet    losartan (COZAAR) 100 MG tablet   amLODipine (NORVASC) 5 MG tablet   Other Relevant Orders   CBC with Differential/Platelet (Completed)   Comprehensive metabolic panel (Completed)     Genitourinary   BPH (benign prostatic hyperplasia)    Under good control on current regimen. Continue current regimen. Continue to monitor. Call with any concerns. Refills given. Labs drawn today.        Relevant Medications   tamsulosin (FLOMAX) 0.4 MG CAPS capsule   dutasteride (AVODART) 0.5 MG capsule   Other Relevant Orders   CBC with Differential/Platelet (Completed)   Comprehensive metabolic panel (Completed)   PSA (Completed)   Urinalysis, Routine w reflex microscopic (Completed)   Chronic kidney disease (CKD), stage III (moderate) ([place])    Rechecking labs today. Await results. Treat as needed.        Relevant Orders   CBC with Differential/Platelet (Completed)   Comprehensive metabolic panel (Completed)   Urinalysis, Routine w reflex microscopic (Completed)     Other   Hyperlipidemia    Under good control on current regimen. Continue current regimen. Continue to monitor. Call with any concerns. Refills given. Labs drawn today.        Relevant Medications   rosuvastatin (CRESTOR) 20 MG tablet   niacin (NIASPAN) 1000 MG CR tablet   losartan (COZAAR) 100 MG tablet   amLODipine (NORVASC)  5 MG tablet   Other Relevant Orders   CBC with Differential/Platelet (Completed)   Comprehensive metabolic panel (Completed)   Lipid Panel w/o Chol/HDL Ratio (Completed)   Elevated PSA    Rechecking labs today. Await results. Treat as needed.        Relevant Medications   tamsulosin (FLOMAX) 0.4 MG CAPS capsule   Polycythemia, secondary    Rechecking labs today. Await results. Treat as needed.        Relevant Orders   CBC with Differential/Platelet (Completed)   Comprehensive metabolic panel (Completed)   GAD (generalized anxiety disorder)    Not under great control. Following with  psychiatry. Continue to follow with psychiatry. Call with any concerns.        Relevant Orders   CBC with Differential/Platelet (Completed)   Comprehensive metabolic panel (Completed)   MDD (major depressive disorder), recurrent, in full remission ([place])    Not under great control. Following with psychiatry. Continue to follow with psychiatry. Call with any concerns.        Relevant Orders   CBC with Differential/Platelet (Completed)   Comprehensive metabolic panel (Completed)   TSH (Completed)     Follow up plan: Return in about 6 months (around 08/23/2021) for physical.

## 2021-03-18 NOTE — Progress Notes (Signed)
[place]  [phone] [phone]  ID: [name] OB: 12/27/1950  MR#: [id number]  DGU#:[id number]  Patient Care Team: [name], DO as PCP - General (Family Medicine) Brendolyn Patty, MD (Dermatology) Earnestine Leys, MD (Specialist) Dionisio David, MD as Consulting Physician (Cardiology) Elvina Mattes, Adele Schilder as Attending Physician (Podiatry) Grayland Ormond Kathlene November, MD as Consulting Physician (Oncology)  CHIEF COMPLAINT: Secondary polycythemia.  INTERVAL HISTORY: Patient returns to clinic today for repeat laboratory work, further evaluation, and consideration of additional phlebotomy.  He currently feels well and is asymptomatic.  He continues to ride his bike nearly every single day.  He has no neurologic complaints.  He denies any recent fevers or illnesses. He has a good appetite and denies weight loss.  He denies any chest pain, shortness of breath, cough, or hemoptysis.  He denies any nausea, vomiting, constipation, or diarrhea. He has no urinary complaints.  Patient offers no specific complaints today.  REVIEW OF SYSTEMS:   Review of Systems  Constitutional: Negative.  Negative for fever, malaise/fatigue and weight loss.  Respiratory: Negative.  Negative for cough and shortness of breath.   Cardiovascular: Negative.  Negative for chest pain and leg swelling.  Gastrointestinal: Negative.  Negative for abdominal pain, blood in stool and melena.  Genitourinary: Negative.  Negative for dysuria.  Musculoskeletal: Negative.  Negative for back pain, joint pain and neck pain.  Skin: Negative.  Negative for rash.  Neurological: Negative.  Negative for dizziness, tingling, sensory change, focal weakness, weakness and headaches.  Psychiatric/Behavioral: Negative.  The patient is not nervous/anxious.    As per HPI. Otherwise, a complete review of systems is negative.  PAST MEDICAL HISTORY: Past Medical History:  Diagnosis Date   Arthritis     Coronary artery disease    Depression    Hyperlipidemia     PAST SURGICAL HISTORY: Past Surgical History:  Procedure Laterality Date   ANKLE FRACTURE SURGERY Right    ARTHRODESIS ANT INTERBODY INC DISCECTOMY, CERVICAL BELOW C2    05/2019   CARDIAC CATHETERIZATION N/A 04/26/2015   Procedure: Left Heart Cath;  Surgeon: Dionisio David, MD;  Location: [facility];  Service: Cardiovascular;  Laterality: N/A;   CORONARY ANGIOPLASTY     CORONARY STENT INTERVENTION N/A 03/27/2018   Procedure: CORONARY STENT INTERVENTION;  Surgeon: Yolonda Kida, MD;  Location: [place];  Service: Cardiovascular;  Laterality: N/A;   EYE SURGERY Bilateral    cataract   LEFT HEART CATH AND CORONARY ANGIOGRAPHY Left 03/27/2018   Procedure: Left Heart Cath with possible coronary intervention;  Surgeon: Dionisio David, MD;  Location: [facility];  Service: Cardiovascular;  Laterality: Left;   TOTAL KNEE ARTHROPLASTY Right 10/08/2018   Procedure: TOTAL KNEE ARTHROPLASTY;  Surgeon: Lovell [name], MD;  Location: [facility];  Service: Orthopedics;  Laterality: Right;    FAMILY HISTORY: Family History  Problem Relation Age of Onset   Diabetes Mother    Hypertension Mother    Cancer Father    Depression Father    Anxiety disorder Father    Depression Daughter     ADVANCED DIRECTIVES (Y/N):  N  HEALTH MAINTENANCE: Social History   Tobacco Use   Smoking status: Former    Pack years: 0.00    Types: Cigars    Quit date: 07/18/2006    Years since quitting: 14.6   Smokeless tobacco: Never  Vaping Use   Vaping Use: Never used  Substance Use Topics  Alcohol use: No   Drug use: No     Colonoscopy:  PAP:  Bone density:  Lipid panel:  Allergies  Allergen Reactions   Clonidine Derivatives Shortness Of Breath and Other (See Comments)    Low heart rate   Clonidine     Mild bradycardia + Somnolence    Current Outpatient Medications  Medication Sig Dispense Refill    acetaminophen (TYLENOL) 500 MG tablet Take by mouth.     amLODipine (NORVASC) 5 MG tablet TAKE ONE TABLET (5 MG) BY MOUTH EVERY DAY 90 tablet 1   aspirin 81 MG EC tablet Take 1 tablet (81 mg total) by mouth daily. 90 tablet 3   buPROPion (WELLBUTRIN) 100 MG tablet Take 1 tablet (100 mg total) by mouth daily. 90 tablet 1   chlorthalidone (HYGROTON) 25 MG tablet      Cholecalciferol 125 MCG (5000 UT) capsule Take 5,000 Units by mouth daily.     clopidogrel (PLAVIX) 75 MG tablet Take 75 mg by mouth daily.     clotrimazole-betamethasone (LOTRISONE) cream Apply 1 application topically 2 (two) times daily. 30 g 0   diclofenac Sodium (VOLTAREN) 1 % GEL Voltaren 1 % topical gel  APPLY 2 GRAMS TO THE AFFECTED AREA(S) BY TOPICAL ROUTE 4 TIMES PER DAY     DULoxetine (CYMBALTA) 20 MG capsule Take 2 capsules (40 mg total) by mouth daily. 60 capsule 1   dutasteride (AVODART) 0.5 MG capsule Take 1 capsule (0.5 mg total) by mouth every evening. 90 capsule 1   fluticasone (FLONASE) 50 MCG/ACT nasal spray Place 2 sprays into both nostrils daily. 16 g 12   hydrALAZINE (APRESOLINE) 100 MG tablet Take 50-100 mg by mouth See admin instructions. Take 50 mg by mouth in the morning and take 100 mg by mouth at bedtime     hydrocortisone (ANUSOL-HC) 2.5 % rectal cream APPLY RECTALLY TWICE DAILY AS DIRECTED 30 g 0   hydrocortisone 2.5 % cream as needed.     losartan (COZAAR) 100 MG tablet TAKE ONE TABLET (100 MG) BY MOUTH EVERY DAY 90 tablet 1   meloxicam (MOBIC) 15 MG tablet Take 1 tablet (15 mg total) by mouth daily. 90 tablet 1   methocarbamol (ROBAXIN) 500 MG tablet Take by mouth.     metoprolol tartrate (LOPRESSOR) 25 MG tablet Take 25 mg by mouth daily.     niacin (NIASPAN) 1000 MG CR tablet Take 2 tablets (2,000 mg total) by mouth at bedtime. 180 tablet 1   Niacin CR 1000 MG TBCR Take by mouth.     Omega-3 1000 MG CAPS Take by mouth.     ondansetron (ZOFRAN-ODT) 4 MG disintegrating tablet      pantoprazole  (PROTONIX) 20 MG tablet Take 20 mg by mouth daily.      rosuvastatin (CRESTOR) 20 MG tablet Take 1 tablet (20 mg total) by mouth every evening. 90 tablet 1   tamsulosin (FLOMAX) 0.4 MG CAPS capsule Take 1 capsule (0.4 mg total) by mouth daily. 90 capsule 1   terbinafine (LAMISIL) 250 MG tablet Take 250 mg by mouth daily.     testosterone cypionate (DEPOTESTOSTERONE CYPIONATE) 200 MG/ML injection INJECT 0.75 ML EVERY 10 DAYS AS DIRECTED 10 mL 0   tizanidine (ZANAFLEX) 2 MG capsule Take by mouth.     VASCEPA 1 g capsule      vitamin B-12 (CYANOCOBALAMIN) 1000 MCG tablet Take 1,000 mcg by mouth daily.     No current facility-administered medications for this visit.  OBJECTIVE: Vitals:   03/21/21 1440  BP: 131/87  Pulse: (!) 102  Resp: 18  Temp: (!) 97 F (36.1 C)  SpO2: 97%     Body mass index is 27.94 kg/m.    ECOG FS:0 - Asymptomatic  General: Well-developed, well-nourished, no acute distress. Eyes: Pink conjunctiva, anicteric sclera. HEENT: Normocephalic, moist mucous membranes. Lungs: No audible wheezing or coughing. Heart: Regular rate and rhythm. Abdomen: Soft, nontender, no obvious distention. Musculoskeletal: No edema, cyanosis, or clubbing. Neuro: Alert, answering all questions appropriately. Cranial nerves grossly intact. Skin: No rashes or petechiae noted. Psych: Normal affect.   LAB RESULTS:  Lab Results  Component Value Date   NA 139 02/21/2021   K 3.2 (L) 02/21/2021   CL 100 02/21/2021   CO2 21 02/21/2021   GLUCOSE 150 (H) 02/21/2021   BUN 21 02/21/2021   CREATININE 1.20 02/21/2021   CALCIUM 9.1 02/21/2021   PROT 6.4 02/21/2021   ALBUMIN 4.2 02/21/2021   AST 34 02/21/2021   ALT 27 02/21/2021   ALKPHOS 55 02/21/2021   BILITOT 0.4 02/21/2021   GFRNONAA 65 08/15/2020   GFRAA 75 08/15/2020    Lab Results  Component Value Date   WBC 9.8 03/21/2021   NEUTROABS 7.4 03/21/2021   HGB 17.6 (H) 03/21/2021   HCT 51.1 03/21/2021   MCV 92.7 03/21/2021    PLT 207 03/21/2021     STUDIES: No results found.  ASSESSMENT: Secondary polycythemia  PLAN:    1. 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  Patient last had phlebotomy in July 2021. Previously, it was agreed upon the goal hemoglobin will be between 17.0 and 17.5.  Patient's hemoglobin has trended up to 17.6, therefore will proceed with phlebotomy today. Patient expressed understanding that as long as he requires testosterone, he likely will require periodic phlebotomies.  Return to clinic in 6 months with repeat laboratory, further evaluation, and continuation of phlebotomy if needed. 2. Low testosterone: Chronic and unchanged. Continue to treatment with testosterone per urology. 3.  Hypertension: Blood pressure is within normal limits today.  Patient expressed understanding and was in agreement with this plan. He also understands that He can call clinic at any time with any questions, concerns, or complaints.    Lloyd Huger, MD   03/21/2021 5:07 PM

## 2021-03-21 ENCOUNTER — Encounter: Payer: Self-pay | Admitting: Oncology

## 2021-03-21 ENCOUNTER — Inpatient Hospital Stay: Payer: Medicare PPO | Attending: Oncology | Admitting: Oncology

## 2021-03-21 ENCOUNTER — Inpatient Hospital Stay: Payer: Medicare PPO

## 2021-03-21 VITALS — BP 129/74 | HR 102 | Temp 97.0°F | Resp 18

## 2021-03-21 VITALS — BP 131/87 | HR 102 | Temp 97.0°F | Resp 18 | Wt 211.8 lb

## 2021-03-21 DIAGNOSIS — D751 Secondary polycythemia: Secondary | ICD-10-CM

## 2021-03-21 LAB — CBC WITH DIFFERENTIAL/PLATELET
Abs Immature Granulocytes: 0.02 10*3/uL (ref 0.00–0.07)
Basophils Absolute: 0 10*3/uL (ref 0.0–0.1)
Basophils Relative: 0 %
Eosinophils Absolute: 0 10*3/uL (ref 0.0–0.5)
Eosinophils Relative: 0 %
HCT: 51.1 % (ref 39.0–52.0)
Hemoglobin: 17.6 g/dL — ABNORMAL HIGH (ref 13.0–17.0)
Immature Granulocytes: 0 %
Lymphocytes Relative: 15 %
Lymphs Abs: 1.5 10*3/uL (ref 0.7–4.0)
MCH: 31.9 pg (ref 26.0–34.0)
MCHC: 34.4 g/dL (ref 30.0–36.0)
MCV: 92.7 fL (ref 80.0–100.0)
Monocytes Absolute: 0.8 10*3/uL (ref 0.1–1.0)
Monocytes Relative: 8 %
Neutro Abs: 7.4 10*3/uL (ref 1.7–7.7)
Neutrophils Relative %: 77 %
Platelets: 207 10*3/uL (ref 150–400)
RBC: 5.51 MIL/uL (ref 4.22–5.81)
RDW: 13.3 % (ref 11.5–15.5)
WBC: 9.8 10*3/uL (ref 4.0–10.5)
nRBC: 0 % (ref 0.0–0.2)

## 2021-03-21 NOTE — Progress Notes (Signed)
Hemoglobin 17.6 today. 500 ml blood removed per phlebotomy order. Patient tolerated procedure well. Accepted a beverage. VSS. Discharged to home with no complaints.

## 2021-03-21 NOTE — Patient Instructions (Signed)

## 2021-03-23 ENCOUNTER — Other Ambulatory Visit: Payer: Medicare PPO

## 2021-03-23 ENCOUNTER — Ambulatory Visit: Payer: Medicare PPO | Admitting: Oncology

## 2021-04-04 ENCOUNTER — Other Ambulatory Visit: Payer: Self-pay | Admitting: *Deleted

## 2021-04-04 DIAGNOSIS — E291 Testicular hypofunction: Secondary | ICD-10-CM

## 2021-04-10 ENCOUNTER — Other Ambulatory Visit: Payer: Self-pay | Admitting: Psychiatry

## 2021-04-10 DIAGNOSIS — F3342 Major depressive disorder, recurrent, in full remission: Secondary | ICD-10-CM

## 2021-04-10 DIAGNOSIS — F411 Generalized anxiety disorder: Secondary | ICD-10-CM

## 2021-04-10 DIAGNOSIS — G4701 Insomnia due to medical condition: Secondary | ICD-10-CM

## 2021-04-17 ENCOUNTER — Telehealth (INDEPENDENT_AMBULATORY_CARE_PROVIDER_SITE_OTHER): Payer: Medicare PPO | Admitting: Psychiatry

## 2021-04-17 ENCOUNTER — Other Ambulatory Visit: Payer: Self-pay

## 2021-04-17 ENCOUNTER — Encounter: Payer: Self-pay | Admitting: Psychiatry

## 2021-04-17 DIAGNOSIS — G4701 Insomnia due to medical condition: Secondary | ICD-10-CM | POA: Diagnosis not present

## 2021-04-17 DIAGNOSIS — Z634 Disappearance and death of family member: Secondary | ICD-10-CM

## 2021-04-17 DIAGNOSIS — F411 Generalized anxiety disorder: Secondary | ICD-10-CM

## 2021-04-17 DIAGNOSIS — E785 Hyperlipidemia, unspecified: Secondary | ICD-10-CM | POA: Diagnosis not present

## 2021-04-17 DIAGNOSIS — F3342 Major depressive disorder, recurrent, in full remission: Secondary | ICD-10-CM | POA: Diagnosis not present

## 2021-04-17 DIAGNOSIS — G4730 Sleep apnea, unspecified: Secondary | ICD-10-CM | POA: Diagnosis not present

## 2021-04-17 DIAGNOSIS — I2510 Atherosclerotic heart disease of native coronary artery without angina pectoris: Secondary | ICD-10-CM | POA: Diagnosis not present

## 2021-04-17 DIAGNOSIS — I10 Essential (primary) hypertension: Secondary | ICD-10-CM | POA: Diagnosis not present

## 2021-04-17 MED ORDER — DULOXETINE HCL 30 MG PO CPEP: 30.0000 mg | ORAL_CAPSULE | Freq: Every day | ORAL | 1 refills | Status: DC

## 2021-04-17 MED ORDER — DULOXETINE HCL 20 MG PO CPEP: 20.0000 mg | ORAL_CAPSULE | Freq: Every day | ORAL | 1 refills | Status: DC

## 2021-04-17 NOTE — Progress Notes (Signed)
Virtual Visit via Video Note  I connected with Kenneth Pruitt on 04/17/21 at  4:00 PM EDT by a video enabled telemedicine application and verified that I am speaking with the correct person using two identifiers.  Location Provider Location : [facility] Patient Location : Home  Participants: Patient , Provider   I discussed the limitations of evaluation and management by telemedicine and the availability of in person appointments. The patient expressed understanding and agreed to proceed.    I discussed the assessment and treatment plan with the patient. The patient was provided an opportunity to ask questions and all were answered. The patient agreed with the plan and demonstrated an understanding of the instructions.   The patient was advised to call back or seek an in-person evaluation if the symptoms worsen or if the condition fails to improve as anticipated.    [place] MD OP Progress Note  04/17/2021 5:55 PM Kenneth Pruitt  MRN:  [id number]  Chief Complaint:  Chief Complaint   Follow-up; Anxiety; Depression    HPI: Kenneth Pruitt is a 70 year old Caucasian male, retired, married, lives in [place], has a history of MDD, GAD, insomnia, coronary artery disease, stent placement, knee pain, testosterone deficiency was evaluated by telemedicine today.  Patient today reports he continues to struggle with right foot pain.  He has burning pain which continues to affect him.  Patient reports most nights he is able to sleep well.  However last night he could not sleep.  Patient reports his pain does make his anxiety worse.  Overall he is tolerating the Cymbalta higher dosage well.  He wonders whether the dosage can be readjusted.  Patient denies any depression.  He is coping with his grief well.  He continues to have good support system from family.  Patient denies any suicidality, homicidality or perceptual disturbances.  Patient denies any other concerns today.  Visit Diagnosis:     ICD-10-CM   1. MDD (major depressive disorder), recurrent, in full remission ([place])  F33.42 DULoxetine (CYMBALTA) 20 MG capsule    2. GAD (generalized anxiety disorder)  F41.1 DULoxetine (CYMBALTA) 20 MG capsule    DULoxetine (CYMBALTA) 30 MG capsule    3. Insomnia due to medical condition  G47.01 DULoxetine (CYMBALTA) 20 MG capsule   depression, pain    4. Bereavement  Z63.4       Past Psychiatric History: I have reviewed past psychiatric history from progress note on 12/16/2017  Past Medical History:  Past Medical History:  Diagnosis Date   Arthritis    Coronary artery disease    Depression    Hyperlipidemia     Past Surgical History:  Procedure Laterality Date   ANKLE FRACTURE SURGERY Right    ARTHRODESIS ANT INTERBODY INC DISCECTOMY, CERVICAL BELOW C2    05/2019   CARDIAC CATHETERIZATION N/A 04/26/2015   Procedure: Left Heart Cath;  Surgeon: Dionisio David, MD;  Location: [place] [facility];  Service: Cardiovascular;  Laterality: N/A;   CORONARY ANGIOPLASTY     CORONARY STENT INTERVENTION N/A 03/27/2018   Procedure: CORONARY STENT INTERVENTION;  Surgeon: Yolonda Kida, MD;  Location: [facility];  Service: Cardiovascular;  Laterality: N/A;   EYE SURGERY Bilateral    cataract   LEFT HEART CATH AND CORONARY ANGIOGRAPHY Left 03/27/2018   Procedure: Left Heart Cath with possible coronary intervention;  Surgeon: Dionisio David, MD;  Location: [facility];  Service: Cardiovascular;  Laterality: Left;   TOTAL KNEE ARTHROPLASTY Right  10/08/2018   Procedure: TOTAL KNEE ARTHROPLASTY;  Surgeon: Lovell [name], MD;  Location: [facility];  Service: Orthopedics;  Laterality: Right;    Family Psychiatric History: I have reviewed family psychiatric history from progress note on 12/16/2017  Family History:  Family History  Problem Relation Age of Onset   Diabetes Mother    Hypertension Mother    Cancer Father    Depression Father    Anxiety disorder Father     Depression Daughter     Social History: Reviewed social history from progress note on 12/16/2017 Social History   Socioeconomic History   Marital status: Married    Spouse name: ann   Number of children: 2   Years of education: Not on file   Highest education level: Master's degree (e.g., MA, MS, MEng, MEd, MSW, MBA)  Occupational History    Comment: retired  Tobacco Use   Smoking status: Former    Types: Cigars    Quit date: 07/18/2006    Years since quitting: 14.7   Smokeless tobacco: Never  Vaping Use   Vaping Use: Never used  Substance and Sexual Activity   Alcohol use: No   Drug use: No   Sexual activity: Not Currently  Other Topics Concern   Not on file  Social History Narrative   Not on file   Social Determinants of Health   Financial Resource Strain: Not on file  Food Insecurity: Not on file  Transportation Needs: Not on file  Physical Activity: Not on file  Stress: Not on file  Social Connections: Not on file    Allergies:  Allergies  Allergen Reactions   Clonidine Derivatives Shortness Of Breath and Other (See Comments)    Low heart rate   Clonidine     Mild bradycardia + Somnolence    Metabolic Disorder Labs: Lab Results  Component Value Date   HGBA1C 5.5 08/15/2020   No results found for: PROLACTIN Lab Results  Component Value Date   CHOL 95 (L) 02/21/2021   TRIG 200 (H) 02/21/2021   HDL 39 (L) 02/21/2021   CHOLHDL 2.5 08/20/2019   VLDL 37 03/28/2018   LDLCALC 24 02/21/2021   LDLCALC 40 08/15/2020   Lab Results  Component Value Date   TSH 1.020 02/21/2021   TSH 0.877 02/12/2020    Therapeutic Level Labs: No results found for: LITHIUM No results found for: VALPROATE No components found for:  CBMZ  Current Medications: Current Outpatient Medications  Medication Sig Dispense Refill   DULoxetine (CYMBALTA) 30 MG capsule Take 1 capsule (30 mg total) by mouth daily. Take along with 20 mg daily 30 capsule 1   acetaminophen (TYLENOL)  500 MG tablet Take by mouth.     amLODipine (NORVASC) 5 MG tablet TAKE ONE TABLET (5 MG) BY MOUTH EVERY DAY 90 tablet 1   aspirin 81 MG EC tablet Take 1 tablet (81 mg total) by mouth daily. 90 tablet 3   buPROPion (WELLBUTRIN) 100 MG tablet Take 1 tablet (100 mg total) by mouth daily. 90 tablet 1   chlorthalidone (HYGROTON) 25 MG tablet      Cholecalciferol 125 MCG (5000 UT) capsule Take 5,000 Units by mouth daily.     clopidogrel (PLAVIX) 75 MG tablet Take 75 mg by mouth daily.     clotrimazole-betamethasone (LOTRISONE) cream Apply 1 application topically 2 (two) times daily. 30 g 0   diclofenac Sodium (VOLTAREN) 1 % GEL Voltaren 1 % topical gel  APPLY 2 GRAMS TO THE AFFECTED  AREA(S) BY TOPICAL ROUTE 4 TIMES PER DAY     DULoxetine (CYMBALTA) 20 MG capsule Take 1 capsule (20 mg total) by mouth daily. Take along with 30 mg daily 30 capsule 1   dutasteride (AVODART) 0.5 MG capsule Take 1 capsule (0.5 mg total) by mouth every evening. 90 capsule 1   fluticasone (FLONASE) 50 MCG/ACT nasal spray Place 2 sprays into both nostrils daily. 16 g 12   hydrALAZINE (APRESOLINE) 100 MG tablet Take 50-100 mg by mouth See admin instructions. Take 50 mg by mouth in the morning and take 100 mg by mouth at bedtime     hydrocortisone (ANUSOL-HC) 2.5 % rectal cream APPLY RECTALLY TWICE DAILY AS DIRECTED 30 g 0   hydrocortisone 2.5 % cream as needed.     losartan (COZAAR) 100 MG tablet TAKE ONE TABLET (100 MG) BY MOUTH EVERY DAY 90 tablet 1   meloxicam (MOBIC) 15 MG tablet Take 1 tablet (15 mg total) by mouth daily. 90 tablet 1   methocarbamol (ROBAXIN) 500 MG tablet Take by mouth.     metoprolol tartrate (LOPRESSOR) 25 MG tablet Take 25 mg by mouth daily.     niacin (NIASPAN) 1000 MG CR tablet Take 2 tablets (2,000 mg total) by mouth at bedtime. 180 tablet 1   Niacin CR 1000 MG TBCR Take by mouth.     Omega-3 1000 MG CAPS Take by mouth.     ondansetron (ZOFRAN-ODT) 4 MG disintegrating tablet      pantoprazole  (PROTONIX) 20 MG tablet Take 20 mg by mouth daily.      rosuvastatin (CRESTOR) 20 MG tablet Take 1 tablet (20 mg total) by mouth every evening. 90 tablet 1   tamsulosin (FLOMAX) 0.4 MG CAPS capsule Take 1 capsule (0.4 mg total) by mouth daily. 90 capsule 1   testosterone cypionate (DEPOTESTOSTERONE CYPIONATE) 200 MG/ML injection INJECT 0.75 ML EVERY 10 DAYS AS DIRECTED 10 mL 0   tizanidine (ZANAFLEX) 2 MG capsule Take by mouth.     VASCEPA 1 g capsule      vitamin B-12 (CYANOCOBALAMIN) 1000 MCG tablet Take 1,000 mcg by mouth daily.     No current facility-administered medications for this visit.     Musculoskeletal: Strength & Muscle Tone:  UTA Gait & Station:  UTA Patient leans:  UTA  Psychiatric Specialty Exam: Review of Systems  Musculoskeletal:        Rt.sided pain - foot  Psychiatric/Behavioral:  The patient is nervous/anxious.   All other systems reviewed and are negative.  There were no vitals taken for this visit.There is no height or weight on file to calculate BMI.  General Appearance: Casual  Eye Contact:  Fair  Speech:  Clear and Coherent  Volume:  Normal  Mood:  Anxious  Affect:  Congruent  Thought Process:  Goal Directed and Descriptions of Associations: Intact  Orientation:  Full (Time, Place, and Person)  Thought Content: Logical   Suicidal Thoughts:  No  Homicidal Thoughts:  No  Memory:  Immediate;   Fair Recent;   Fair Remote;   Fair  Judgement:  Fair  Insight:  Fair  Psychomotor Activity:  Normal  Concentration:  Concentration: Fair and Attention Span: Fair  Recall:  [employer] of Knowledge: Fair  Language: Fair  Akathisia:  No  Handed:  Right  AIMS (if indicated): not done  Assets:  Communication Skills Desire for Improvement Housing Social Support  ADL's:  Intact  Cognition: WNL  Sleep:   Restless at  times due to pain - overall OK   Screenings: GAD-7    Flowsheet Row Video Visit from 04/17/2021 in [facility]  Office Visit from 02/21/2021 in [facility] Video Visit from 02/09/2021 in [facility]  Total GAD-7 Score '6 5 4      '$ PHQ2-9    Flowsheet Row Video Visit from 04/17/2021 in [facility] Office Visit from 02/21/2021 in [facility] Video Visit from 02/09/2021 in [facility] Office Visit from 08/15/2020 in [facility] from 04/30/2019 in [facility]  PHQ-2 Total Score 0 2 0 3 0  PHQ-9 Total Score -- '8 4 11 '$ --        Assessment and Plan: ADRION STICKROD is a 70 year old Caucasian male, has a history of MDD, anxiety disorder, multiple medical problems including essential hypertension, coronary artery disease status post stent placement, hyperlipidemia, BPH, chronic pain, testosterone deficiency was evaluated by telemedicine today.  Patient is currently struggling with pain of his right foot which does have an impact on his mood.  Discussed plan as noted below.  Plan MDD in remission Wellbutrin 100 mg p.o. daily  GAD-improving Although anxiety is improving on the current dosage of Cymbalta we will increase the dosage to address his anxiety as well as neuropathy pain. Increase Cymbalta to 50 mg p.o. daily. Bereavement-improving We will monitor closely  Insomnia-improving Continue Cymbalta as prescribed.  Continue sleep hygiene techniques  Follow-up in clinic in 2 months or sooner if needed.  This note was generated in part or whole with voice recognition software. Voice recognition is usually quite accurate but there are transcription errors that can and very often do occur. I apologize for any typographical errors that were not detected and corrected.        Ursula Alert, MD 04/18/2021, 12:29 PM

## 2021-04-22 ENCOUNTER — Emergency Department: Payer: Medicare PPO

## 2021-04-22 ENCOUNTER — Emergency Department
Admission: EM | Admit: 2021-04-22 | Discharge: 2021-04-22 | Disposition: A | Discharge status: Home / Self Care | Payer: Medicare PPO | Attending: Emergency Medicine | Admitting: Emergency Medicine

## 2021-04-22 ENCOUNTER — Other Ambulatory Visit: Payer: Self-pay

## 2021-04-22 ENCOUNTER — Encounter: Payer: Self-pay | Admitting: Emergency Medicine

## 2021-04-22 DIAGNOSIS — Y92410 Unspecified street and highway as the place of occurrence of the external cause: Secondary | ICD-10-CM | POA: Diagnosis not present

## 2021-04-22 DIAGNOSIS — Z23 Encounter for immunization: Secondary | ICD-10-CM | POA: Diagnosis not present

## 2021-04-22 DIAGNOSIS — Z7982 Long term (current) use of aspirin: Secondary | ICD-10-CM | POA: Insufficient documentation

## 2021-04-22 DIAGNOSIS — N183 Chronic kidney disease, stage 3 unspecified: Secondary | ICD-10-CM | POA: Diagnosis not present

## 2021-04-22 DIAGNOSIS — R55 Syncope and collapse: Secondary | ICD-10-CM | POA: Diagnosis not present

## 2021-04-22 DIAGNOSIS — S80211A Abrasion, right knee, initial encounter: Secondary | ICD-10-CM | POA: Diagnosis not present

## 2021-04-22 DIAGNOSIS — I2510 Atherosclerotic heart disease of native coronary artery without angina pectoris: Secondary | ICD-10-CM | POA: Diagnosis not present

## 2021-04-22 DIAGNOSIS — Z79899 Other long term (current) drug therapy: Secondary | ICD-10-CM | POA: Insufficient documentation

## 2021-04-22 DIAGNOSIS — Z7902 Long term (current) use of antithrombotics/antiplatelets: Secondary | ICD-10-CM | POA: Diagnosis not present

## 2021-04-22 DIAGNOSIS — S42021A Displaced fracture of shaft of right clavicle, initial encounter for closed fracture: Secondary | ICD-10-CM | POA: Diagnosis not present

## 2021-04-22 DIAGNOSIS — R4020 Unspecified coma: Secondary | ICD-10-CM

## 2021-04-22 DIAGNOSIS — S199XXA Unspecified injury of neck, initial encounter: Secondary | ICD-10-CM | POA: Diagnosis not present

## 2021-04-22 DIAGNOSIS — S50311A Abrasion of right elbow, initial encounter: Secondary | ICD-10-CM | POA: Insufficient documentation

## 2021-04-22 DIAGNOSIS — S0990XA Unspecified injury of head, initial encounter: Secondary | ICD-10-CM | POA: Diagnosis not present

## 2021-04-22 DIAGNOSIS — S4991XA Unspecified injury of right shoulder and upper arm, initial encounter: Secondary | ICD-10-CM | POA: Diagnosis present

## 2021-04-22 DIAGNOSIS — Z96651 Presence of right artificial knee joint: Secondary | ICD-10-CM | POA: Insufficient documentation

## 2021-04-22 DIAGNOSIS — S42031A Displaced fracture of lateral end of right clavicle, initial encounter for closed fracture: Secondary | ICD-10-CM | POA: Diagnosis not present

## 2021-04-22 DIAGNOSIS — Z981 Arthrodesis status: Secondary | ICD-10-CM | POA: Diagnosis not present

## 2021-04-22 DIAGNOSIS — I129 Hypertensive chronic kidney disease with stage 1 through stage 4 chronic kidney disease, or unspecified chronic kidney disease: Secondary | ICD-10-CM | POA: Insufficient documentation

## 2021-04-22 DIAGNOSIS — M25511 Pain in right shoulder: Secondary | ICD-10-CM | POA: Diagnosis not present

## 2021-04-22 DIAGNOSIS — Z87891 Personal history of nicotine dependence: Secondary | ICD-10-CM | POA: Diagnosis not present

## 2021-04-22 DIAGNOSIS — R079 Chest pain, unspecified: Secondary | ICD-10-CM | POA: Diagnosis not present

## 2021-04-22 LAB — CBC WITH DIFFERENTIAL/PLATELET
Abs Immature Granulocytes: 0.06 10*3/uL (ref 0.00–0.07)
Basophils Absolute: 0 10*3/uL (ref 0.0–0.1)
Basophils Relative: 0 %
Eosinophils Absolute: 0 10*3/uL (ref 0.0–0.5)
Eosinophils Relative: 0 %
HCT: 48.7 % (ref 39.0–52.0)
Hemoglobin: 17.1 g/dL — ABNORMAL HIGH (ref 13.0–17.0)
Immature Granulocytes: 1 %
Lymphocytes Relative: 8 %
Lymphs Abs: 1 10*3/uL (ref 0.7–4.0)
MCH: 32.3 pg (ref 26.0–34.0)
MCHC: 35.1 g/dL (ref 30.0–36.0)
MCV: 92.1 fL (ref 80.0–100.0)
Monocytes Absolute: 0.9 10*3/uL (ref 0.1–1.0)
Monocytes Relative: 7 %
Neutro Abs: 10.3 10*3/uL — ABNORMAL HIGH (ref 1.7–7.7)
Neutrophils Relative %: 84 %
Platelets: 235 10*3/uL (ref 150–400)
RBC: 5.29 MIL/uL (ref 4.22–5.81)
RDW: 12.9 % (ref 11.5–15.5)
WBC: 12.3 10*3/uL — ABNORMAL HIGH (ref 4.0–10.5)
nRBC: 0 % (ref 0.0–0.2)

## 2021-04-22 LAB — BASIC METABOLIC PANEL
Anion gap: 10 (ref 5–15)
BUN: 27 mg/dL — ABNORMAL HIGH (ref 8–23)
CO2: 27 mmol/L (ref 22–32)
Calcium: 9.6 mg/dL (ref 8.9–10.3)
Chloride: 104 mmol/L (ref 98–111)
Creatinine, Ser: 1.34 mg/dL — ABNORMAL HIGH (ref 0.61–1.24)
GFR, Estimated: 57 mL/min — ABNORMAL LOW (ref >60)
Glucose, Bld: 116 mg/dL — ABNORMAL HIGH (ref 70–99)
Potassium: 3.4 mmol/L — ABNORMAL LOW (ref 3.5–5.1)
Sodium: 141 mmol/L (ref 135–145)

## 2021-04-22 LAB — TROPONIN I (HIGH SENSITIVITY)
Troponin I (High Sensitivity): 14 ng/L (ref <18)
Troponin I (High Sensitivity): 15 ng/L (ref <18)

## 2021-04-22 MED ORDER — TETANUS-DIPHTH-ACELL PERTUSSIS 5-2.5-18.5 LF-MCG/0.5 IM SUSY
0.5000 mL | PREFILLED_SYRINGE | Freq: Once | INTRAMUSCULAR | Status: AC
  Administered 2021-04-22: 0.5 mL via INTRAMUSCULAR
  Filled 2021-04-22: qty 0.5
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
## 2021-04-22 NOTE — ED Notes (Signed)
Pt taken to CT.
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
## 2021-04-22 NOTE — ED Notes (Signed)
Called xray to bring pt to room 26.

## 2021-04-22 NOTE — ED Notes (Signed)
Patient ambulatory to restroom and back to room with steady gait. Patient respirations even and unlabored. NADN. Patient provided crackers and soda at his request.

## 2021-04-22 NOTE — ED Notes (Signed)
Wound care instructions and supplies provided to patient. Patient offered wheelchair at discharge, but declined. Patient ambulatory with steady gait. Sling applied by Romilda Joy, ED NT prior to discharge, CMS intact to all extremities.

## 2021-04-22 NOTE — ED Notes (Signed)
ED Provider at bedside. 

## 2021-04-24 DIAGNOSIS — S42001A Fracture of unspecified part of right clavicle, initial encounter for closed fracture: Secondary | ICD-10-CM | POA: Diagnosis not present

## 2021-05-04 ENCOUNTER — Telehealth: Payer: Self-pay | Admitting: Family Medicine

## 2021-05-04 DIAGNOSIS — M25511 Pain in right shoulder: Secondary | ICD-10-CM | POA: Diagnosis not present

## 2021-05-04 DIAGNOSIS — S42001A Fracture of unspecified part of right clavicle, initial encounter for closed fracture: Secondary | ICD-10-CM | POA: Diagnosis not present

## 2021-05-04 DIAGNOSIS — G8918 Other acute postprocedural pain: Secondary | ICD-10-CM | POA: Diagnosis not present

## 2021-05-04 DIAGNOSIS — S42021A Displaced fracture of shaft of right clavicle, initial encounter for closed fracture: Secondary | ICD-10-CM | POA: Diagnosis not present

## 2021-05-04 HISTORY — PX: CLAVICLE SURGERY: SHX598

## 2021-05-04 NOTE — Telephone Encounter (Signed)
Copied from [place] [id number]. Topic: Medicare AWV >> May 04, 2021  1:52 PM Lavonia Drafts wrote: Reason for CRM: Left message for patient to call back and schedule Medicare Annual Wellness Visit (AWV) to be done virtually or by telephone.  No hx of AWV eligible as of 04/30/20  Please schedule at anytime with [facility]-Nurse Health Advisor.      69 Minutes appointment   Any questions, please call me at [phone]

## 2021-05-10 DIAGNOSIS — Z09 Encounter for follow-up examination after completed treatment for conditions other than malignant neoplasm: Secondary | ICD-10-CM | POA: Diagnosis not present

## 2021-05-15 DIAGNOSIS — S42001A Fracture of unspecified part of right clavicle, initial encounter for closed fracture: Secondary | ICD-10-CM | POA: Diagnosis not present

## 2021-05-23 DIAGNOSIS — I10 Essential (primary) hypertension: Secondary | ICD-10-CM | POA: Diagnosis not present

## 2021-05-23 DIAGNOSIS — G4733 Obstructive sleep apnea (adult) (pediatric): Secondary | ICD-10-CM | POA: Diagnosis not present

## 2021-05-29 ENCOUNTER — Other Ambulatory Visit: Payer: Self-pay | Admitting: Family Medicine

## 2021-05-29 ENCOUNTER — Other Ambulatory Visit: Payer: Self-pay | Admitting: Psychiatry

## 2021-05-29 DIAGNOSIS — G4701 Insomnia due to medical condition: Secondary | ICD-10-CM

## 2021-05-29 DIAGNOSIS — S42001D Fracture of unspecified part of right clavicle, subsequent encounter for fracture with routine healing: Secondary | ICD-10-CM | POA: Diagnosis not present

## 2021-05-29 DIAGNOSIS — F3342 Major depressive disorder, recurrent, in full remission: Secondary | ICD-10-CM

## 2021-05-29 DIAGNOSIS — F411 Generalized anxiety disorder: Secondary | ICD-10-CM

## 2021-05-29 NOTE — Telephone Encounter (Signed)
Requested medication (s) are due for refill today: yes  Requested medication (s) are on the active medication list: no  Last refill:  05/21/18  Future visit scheduled: yes  Notes to clinic:  med was dc'd 09/15/18 "completed course"    Requested Prescriptions  Pending Prescriptions Disp Refills   mupirocin ointment (BACTROBAN) 2 % [Pharmacy Med Name: MUPIROCIN 2% TOP OINT GM] 22 g     Sig: APPLY AS DIRECTED     Off-Protocol Failed - 05/29/2021 11:39 AM      Failed - Medication not assigned to a protocol, review manually.      Passed - Valid encounter within last 12 months    Recent Outpatient Visits           3 months ago Essential hypertension   [place], [place], DO   9 months ago Essential hypertension   [place], [name], DO   1 year ago Routine general medical examination at a health care facility   [facility], [place], DO   1 year ago Essential hypertension   [place], Mark A, MD   2 years ago Need for pneumococcal vaccination   [place], [place]       Future Appointments             In 1 week [name], [name], MD [place]   In 3 months [name], [name], DO [facility], [place]

## 2021-05-30 NOTE — Telephone Encounter (Signed)
Needs an appointment for refill. Has not had medication since 2019 and it is not on his med list.

## 2021-06-01 NOTE — Telephone Encounter (Signed)
Pt has an appt scheduled for 06/06/2021 with Dr. [name].

## 2021-06-06 ENCOUNTER — Encounter: Payer: Self-pay | Admitting: Family Medicine

## 2021-06-06 ENCOUNTER — Ambulatory Visit (INDEPENDENT_AMBULATORY_CARE_PROVIDER_SITE_OTHER): Payer: Medicare PPO

## 2021-06-06 ENCOUNTER — Ambulatory Visit: Payer: Medicare PPO | Admitting: Family Medicine

## 2021-06-06 ENCOUNTER — Other Ambulatory Visit: Payer: Self-pay

## 2021-06-06 ENCOUNTER — Other Ambulatory Visit: Payer: Medicare PPO

## 2021-06-06 VITALS — BP 129/70 | HR 74 | Temp 98.9°F | Wt 210.2 lb

## 2021-06-06 DIAGNOSIS — E291 Testicular hypofunction: Secondary | ICD-10-CM | POA: Diagnosis not present

## 2021-06-06 DIAGNOSIS — Z0000 Encounter for general adult medical examination without abnormal findings: Secondary | ICD-10-CM

## 2021-06-06 DIAGNOSIS — S80211A Abrasion, right knee, initial encounter: Secondary | ICD-10-CM

## 2021-06-06 MED ORDER — MUPIROCIN CALCIUM 2 % EX CREA: 1.0000 "application " | TOPICAL_CREAM | Freq: Two times a day (BID) | CUTANEOUS | 2 refills | Status: DC
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
## 2021-06-06 NOTE — Progress Notes (Signed)
Subjective:   Kenneth Pruitt is a 70 y.o. male who presents for Medicare Annual/Subsequent preventive examination.  I connected with  Kenneth Pruitt on 06/06/21 by an audio only telemedicine application and verified that I am speaking with the correct person using two identifiers.   I discussed the limitations, risks, security and privacy concerns of performing an evaluation and management service by telephone and the availability of in person appointments. I also discussed with the patient that there may be a patient responsible charge related to this service. The patient expressed understanding and verbally consented to this telephonic visit.  Location of Patient: home Location of Provider: office  List any persons and their role that are participating in the visit with the patient.    Review of Systems    Defer to PCP.   Cardiac Risk Factors include: advanced age (>49men, >58 women);hypertension;male gender     Objective:    Today's Vitals   06/06/21 1720  PainSc: 0-No pain   There is no height or weight on file to calculate BMI.  Advanced Directives 06/06/2021 04/22/2021 03/21/2021 11/21/2020 07/25/2020 03/24/2020 09/25/2019  Does Patient Have a Medical Advance Directive? No No No No No No No  Type of Advance Directive - - - - - - -  Would patient like information on creating a medical advance directive? No - Patient declined - No - Patient declined - No - Patient declined No - Patient declined -  Some encounter information is confidential and restricted. Go to Review Flowsheets activity to see all data.    Current Medications (verified) Outpatient Encounter Medications as of 06/06/2021  Medication Sig   acetaminophen (TYLENOL) 500 MG tablet Take by mouth.   amLODipine (NORVASC) 5 MG tablet TAKE ONE TABLET (5 MG) BY MOUTH EVERY DAY   aspirin 81 MG EC tablet Take 1 tablet (81 mg total) by mouth daily.   buPROPion (WELLBUTRIN) 100 MG tablet Take 1 tablet (100 mg total) by mouth  daily.   chlorthalidone (HYGROTON) 25 MG tablet Take 25 mg by mouth daily.   Cholecalciferol 125 MCG (5000 UT) capsule Take 5,000 Units by mouth daily.   clopidogrel (PLAVIX) 75 MG tablet Take 75 mg by mouth daily.   clotrimazole-betamethasone (LOTRISONE) cream Apply 1 application topically 2 (two) times daily.   diclofenac Sodium (VOLTAREN) 1 % GEL Voltaren 1 % topical gel  APPLY 2 GRAMS TO THE AFFECTED AREA(S) BY TOPICAL ROUTE 4 TIMES PER DAY   DULoxetine (CYMBALTA) 20 MG capsule Take 1 capsule (20 mg total) by mouth daily. Take along with 30 mg daily   DULoxetine (CYMBALTA) 30 MG capsule Take 1 capsule (30 mg total) by mouth daily. Take along with 20 mg daily   dutasteride (AVODART) 0.5 MG capsule Take 1 capsule (0.5 mg total) by mouth every evening.   fluticasone (FLONASE) 50 MCG/ACT nasal spray Place 2 sprays into both nostrils daily.   hydrALAZINE (APRESOLINE) 100 MG tablet Take 50-100 mg by mouth See admin instructions. Take 50 mg by mouth in the morning and take 100 mg by mouth at bedtime   hydrocortisone (ANUSOL-HC) 2.5 % rectal cream APPLY RECTALLY TWICE DAILY AS DIRECTED   hydrocortisone 2.5 % cream as needed.   losartan (COZAAR) 100 MG tablet TAKE ONE TABLET (100 MG) BY MOUTH EVERY DAY   meloxicam (MOBIC) 15 MG tablet Take 1 tablet (15 mg total) by mouth daily.   methocarbamol (ROBAXIN) 500 MG tablet Take by mouth.   metoprolol tartrate (LOPRESSOR) 25  MG tablet Take 25 mg by mouth daily.   mupirocin cream (BACTROBAN) 2 % Apply 1 application topically 2 (two) times daily.   niacin (NIASPAN) 1000 MG CR tablet Take 2 tablets (2,000 mg total) by mouth at bedtime.   Omega-3 1000 MG CAPS Take by mouth.   ondansetron (ZOFRAN-ODT) 4 MG disintegrating tablet    pantoprazole (PROTONIX) 20 MG tablet Take 20 mg by mouth daily.    rosuvastatin (CRESTOR) 20 MG tablet Take 1 tablet (20 mg total) by mouth every evening.   tamsulosin (FLOMAX) 0.4 MG CAPS capsule Take 1 capsule (0.4 mg total) by  mouth daily.   testosterone cypionate (DEPOTESTOSTERONE CYPIONATE) 200 MG/ML injection INJECT 0.75 ML EVERY 10 DAYS AS DIRECTED   tizanidine (ZANAFLEX) 2 MG capsule Take by mouth.   VASCEPA 1 g capsule    vitamin B-12 (CYANOCOBALAMIN) 1000 MCG tablet Take 1,000 mcg by mouth daily.   No facility-administered encounter medications on file as of 06/06/2021.    Allergies (verified) Clonidine derivatives and Clonidine   History: Past Medical History:  Diagnosis Date   Arthritis    Coronary artery disease    Depression    Hyperlipidemia    Past Surgical History:  Procedure Laterality Date   ANKLE FRACTURE SURGERY Right    ARTHRODESIS ANT INTERBODY INC DISCECTOMY, CERVICAL BELOW C2    05/2019   CARDIAC CATHETERIZATION N/A 04/26/2015   Procedure: Left Heart Cath;  Surgeon: Dionisio David, MD;  Location: [facility];  Service: Cardiovascular;  Laterality: N/A;   CLAVICLE SURGERY Right 05/04/2021   CORONARY ANGIOPLASTY     CORONARY STENT INTERVENTION N/A 03/27/2018   Procedure: CORONARY STENT INTERVENTION;  Surgeon: Yolonda Kida, MD;  Location: [facility];  Service: Cardiovascular;  Laterality: N/A;   EYE SURGERY Bilateral    cataract   LEFT HEART CATH AND CORONARY ANGIOGRAPHY Left 03/27/2018   Procedure: Left Heart Cath with possible coronary intervention;  Surgeon: Dionisio David, MD;  Location: [facility];  Service: Cardiovascular;  Laterality: Left;   TOTAL KNEE ARTHROPLASTY Right 10/08/2018   Procedure: TOTAL KNEE ARTHROPLASTY;  Surgeon: Lovell [name], MD;  Location: [facility];  Service: Orthopedics;  Laterality: Right;   Family History  Problem Relation Age of Onset   Diabetes Mother    Hypertension Mother    Cancer Father    Depression Father    Anxiety disorder Father    Depression Daughter    Social History   Socioeconomic History   Marital status: Married    Spouse name: ann   Number of children: 2   Years of education: Not on  file   Highest education level: Master's degree (e.g., MA, MS, MEng, MEd, MSW, MBA)  Occupational History    Comment: retired  Tobacco Use   Smoking status: Former    Types: Cigars    Quit date: 07/18/2006    Years since quitting: 14.8   Smokeless tobacco: Never  Vaping Use   Vaping Use: Never used  Substance and Sexual Activity   Alcohol use: No   Drug use: No   Sexual activity: Not Currently  Other Topics Concern   Not on file  Social History Narrative   Not on file   Social Determinants of Health   Financial Resource Strain: Low Risk    Difficulty of Paying Living Expenses: Not hard at all  Food Insecurity: No Food Insecurity   Worried About Estate manager/land agent of Food in the Last  Year: Never true   [place] in the Last Year: Never true  Transportation Needs: No Transportation Needs   Lack of Transportation (Medical): No   Lack of Transportation (Non-Medical): No  Physical Activity: Inactive   Days of Exercise per Week: 0 days   Minutes of Exercise per Session: 0 min  Stress: No Stress Concern Present   Feeling of Stress : Not at all  Social Connections: Moderately Integrated   Frequency of Communication with Friends and Family: More than three times a week   Frequency of Social Gatherings with Friends and Family: Once a week   Attends Religious Services: More than 4 times per year   Active Member of [employer] or Organizations: No   Attends Music therapist: Never   Marital Status: Married    Tobacco Counseling Counseling given: Not Answered   Clinical Intake:  Pre-visit preparation completed: Yes  Pain : No/denies pain Pain Score: 0-No pain     Nutritional Risks: None Diabetes: No  How often do you need to have someone help you when you read instructions, pamphlets, or other written materials from your doctor or pharmacy?: 1 - Never What is the last grade level you completed in school?: Master's Degree  Diabetic?No  Interpreter Needed?:  No      Activities of Daily Living In your present state of health, do you have any difficulty performing the following activities: 06/06/2021 08/15/2020  Hearing? N N  Vision? N Y  Difficulty concentrating or making decisions? N N  Walking or climbing stairs? N Y  Dressing or bathing? N N  Doing errands, shopping? N N  Preparing Food and eating ? N -  Using the Toilet? N -  In the past six months, have you accidently leaked urine? N -  Do you have problems with loss of bowel control? N -  Managing your Medications? N -  Managing your Finances? N -  Housekeeping or managing your Housekeeping? N -  Some recent data might be hidden    Patient Care Team: [name], DO as PCP - General (Family Medicine) Brendolyn Patty, MD (Dermatology) Earnestine Leys, MD (Specialist) Dionisio David, MD as Consulting Physician (Cardiology) Elvina Mattes, Adele Schilder as Attending Physician (Podiatry) Grayland Ormond Kathlene November, MD as Consulting Physician (Oncology)  Indicate any recent Medical Services you may have received from other than [facility] providers in the past year (date may be approximate).     Assessment:   This is a routine wellness examination for Taheem.  Hearing/Vision screen No results found.  Dietary issues and exercise activities discussed: Current Exercise Habits: Home exercise routine, Exercise limited by: orthopedic condition(s)   Goals Addressed   None   Depression Screen PHQ 2/9 Scores 06/06/2021 02/21/2021 08/15/2020 04/30/2019 11/26/2018 10/11/2017 03/12/2017  PHQ - 2 Score 2 2 3  0 0 6 1  PHQ- 9 Score 3 8 11  - 0 24 -  Some encounter information is confidential and restricted. Go to Review Flowsheets activity to see all data.    Fall Risk Fall Risk  06/06/2021 08/15/2020 04/30/2019 11/26/2018 03/26/2018  Falls in the past year? 1 1 1  0 No  Number falls in past yr: 0 1 1 0 -  Injury with Fall? 1 0 0 0 -  Risk for fall due to : Impaired balance/gait - Impaired balance/gait - -   Follow up Falls evaluation completed - - Falls evaluation completed -    FALL RISK PREVENTION PERTAINING TO THE HOME:  Any stairs in or around the home? Yes  If so, are there any without handrails? No  Home free of loose throw rugs in walkways, pet beds, electrical cords, etc? Yes  Adequate lighting in your home to reduce risk of falls? Yes   ASSISTIVE DEVICES UTILIZED TO PREVENT FALLS:  Life alert? No  Use of a cane, walker or w/c? No  Grab bars in the bathroom? Yes  Shower chair or bench in shower? No  Elevated toilet seat or a handicapped toilet? Yes   TIMED UP AND GO:  Was the test performed?  N/A .  Length of time to ambulate 10 feet: N/A sec.     Cognitive Function:     6CIT Screen 06/06/2021 10/11/2017  What Year? 0 points 0 points  What month? 0 points 0 points  What time? 0 points 0 points  Count back from 20 0 points 0 points  Months in reverse 0 points 0 points  Repeat phrase 0 points 0 points  Total Score 0 0    Immunizations Immunization History  Administered Date(s) Administered   Influenza, High Dose Seasonal PF 07/18/2017, 07/13/2019   Influenza,inj,Quad PF,6+ Mos 09/13/2015   Influenza-Unspecified 07/13/2016, 07/28/2018   Moderna Sars-Covid-2 Vaccination 10/22/2019, 11/20/2019, 07/10/2020   Pneumococcal Conjugate-13 10/11/2017   Pneumococcal Polysaccharide-23 11/26/2018   Tdap 07/30/2011, 04/22/2021   Zoster Recombinat (Shingrix) 02/24/2020   Zoster, Live 07/30/2011    TDAP status: Up to date  Flu Vaccine status: Due, Education has been provided regarding the importance of this vaccine. Advised may receive this vaccine at local pharmacy or Health Dept. Aware to provide a copy of the vaccination record if obtained from local pharmacy or Health Dept. Verbalized acceptance and understanding.  Pneumococcal vaccine status: Up to date  Covid-19 vaccine status: Information provided on how to obtain vaccines.   Qualifies for Shingles Vaccine? Yes    Zostavax completed Yes   Shingrix Completed?: No.    Education has been provided regarding the importance of this vaccine. Patient has been advised to call insurance company to determine out of pocket expense if they have not yet received this vaccine. Advised may also receive vaccine at local pharmacy or Health Dept. Verbalized acceptance and understanding.  Screening Tests Health Maintenance  Topic Date Due   Zoster Vaccines- Shingrix (2 of 2) 04/20/2020   COVID-19 Vaccine (4 - Booster for Moderna series) 10/02/2020   INFLUENZA VACCINE  07/18/2021 (Originally 04/17/2021)   Fecal DNA (Cologuard)  02/25/2023   TETANUS/TDAP  04/23/2031   Hepatitis C Screening  Completed   HPV VACCINES  Aged Out    Health Maintenance  Health Maintenance Due  Topic Date Due   Zoster Vaccines- Shingrix (2 of 2) 04/20/2020   COVID-19 Vaccine (4 - Booster for Moderna series) 10/02/2020    Colorectal cancer screening: Type of screening: Cologuard. Completed 02/25/20. Repeat every 3 years  Lung Cancer Screening: (Low Dose CT Chest recommended if Age 39-80 years, 30 pack-year currently smoking OR have quit w/in 15years.) does not qualify.   Lung Cancer Screening Referral: N/A  Additional Screening:  Hepatitis C Screening: does qualify; Completed 11/17/15  Vision Screening: Recommended annual ophthalmology exams for early detection of glaucoma and other disorders of the eye. Is the patient up to date with their annual eye exam?  Yes  Who is the provider or what is the name of the office in which the patient attends annual eye exams?  [facility] If pt is not established with a  provider, would they like to be referred to a provider to establish care? No .   Dental Screening: Recommended annual dental exams for proper oral hygiene  Community Resource Referral / Chronic Care Management: CRR required this visit?   No  CCM required this visit?  No      Plan:     I have personally reviewed  and noted the following in the patient's chart:   Medical and social history Use of alcohol, tobacco or illicit drugs  Current medications and supplements including opioid prescriptions. Patient is not currently taking opioid prescriptions. Functional ability and status Nutritional status Physical activity Advanced directives List of other physicians Hospitalizations, surgeries, and ER visits in previous 12 months Vitals Screenings to include cognitive, depression, and falls Referrals and appointments  In addition, I have reviewed and discussed with patient certain preventive protocols, quality metrics, and best practice recommendations. A written personalized care plan for preventive services as well as general preventive health recommendations were provided to patient.   Mr. Reichart , Thank you for taking time to come for your Medicare Wellness Visit. I appreciate your ongoing commitment to your health goals. Please review the following plan we discussed and let me know if I can assist you in the future.   These are the goals we discussed:  Goals      DIET - INCREASE WATER INTAKE     Recommend drinking at least 6-8 glasses of water a day         This is a list of the screening recommended for you and due dates:  Health Maintenance  Topic Date Due   Zoster (Shingles) Vaccine (2 of 2) 04/20/2020   COVID-19 Vaccine (4 - Booster for Moderna series) 10/02/2020   Flu Shot  07/18/2021*   Cologuard (Stool DNA test)  02/25/2023   Tetanus Vaccine  04/23/2031   Hepatitis C Screening: USPSTF Recommendation to screen - Ages 18-79 yo.  Completed   HPV Vaccine  Aged Out  *Topic was postponed. The date shown is not the original due date.        Georgina Peer, [place]   06/06/2021   Nurse Notes: Non Face to Face 60 Minutes

## 2021-06-06 NOTE — Patient Instructions (Signed)
Health Maintenance, Male Adopting a healthy lifestyle and getting preventive care are important in promoting health and wellness. Ask your health care provider about: The right schedule for you to have regular tests and exams. Things you can do on your own to prevent diseases and keep yourself healthy. What should I know about diet, weight, and exercise? Eat a healthy diet  Eat a diet that includes plenty of vegetables, fruits, low-fat dairy products, and lean protein. Do not eat a lot of foods that are high in solid fats, added sugars, or sodium. Maintain a healthy weight Body mass index (BMI) is a measurement that can be used to identify possible weight problems. It estimates body fat based on height and weight. Your health care provider can help determine your BMI and help you achieve or maintain a healthy weight. Get regular exercise Get regular exercise. This is one of the most important things you can do for your health. Most adults should: Exercise for at least 150 minutes each week. The exercise should increase your heart rate and make you sweat (moderate-intensity exercise). Do strengthening exercises at least twice a week. This is in addition to the moderate-intensity exercise. Spend less time sitting. Even light physical activity can be beneficial. Watch cholesterol and blood lipids Have your blood tested for lipids and cholesterol at 70 years of age, then have this test every 5 years. You may need to have your cholesterol levels checked more often if: Your lipid or cholesterol levels are high. You are older than 70 years of age. You are at high risk for heart disease. What should I know about cancer screening? Many types of cancers can be detected early and may often be prevented. Depending on your health history and family history, you may need to have cancer screening at various ages. This may include screening for: Colorectal cancer. Prostate cancer. Skin cancer. Lung  cancer. What should I know about heart disease, diabetes, and high blood pressure? Blood pressure and heart disease High blood pressure causes heart disease and increases the risk of stroke. This is more likely to develop in people who have high blood pressure readings, are of African descent, or are overweight. Talk with your health care provider about your target blood pressure readings. Have your blood pressure checked: Every 3-5 years if you are 18-39 years of age. Every year if you are 40 years old or older. If you are between the ages of 65 and 75 and are a current or former smoker, ask your health care provider if you should have a one-time screening for abdominal aortic aneurysm (AAA). Diabetes Have regular diabetes screenings. This checks your fasting blood sugar level. Have the screening done: Once every three years after age 45 if you are at a normal weight and have a low risk for diabetes. More often and at a younger age if you are overweight or have a high risk for diabetes. What should I know about preventing infection? Hepatitis B If you have a higher risk for hepatitis B, you should be screened for this virus. Talk with your health care provider to find out if you are at risk for hepatitis B infection. Hepatitis C Blood testing is recommended for: Everyone born from 1945 through 1965. Anyone with known risk factors for hepatitis C. Sexually transmitted infections (STIs) You should be screened each year for STIs, including gonorrhea and chlamydia, if: You are sexually active and are younger than 70 years of age. You are older than 70 years   of age and your health care provider tells you that you are at risk for this type of infection. Your sexual activity has changed since you were last screened, and you are at increased risk for chlamydia or gonorrhea. Ask your health care provider if you are at risk. Ask your health care provider about whether you are at high risk for HIV.  Your health care provider may recommend a prescription medicine to help prevent HIV infection. If you choose to take medicine to prevent HIV, you should first get tested for HIV. You should then be tested every 3 months for as long as you are taking the medicine. Follow these instructions at home: Lifestyle Do not use any products that contain nicotine or tobacco, such as cigarettes, e-cigarettes, and chewing tobacco. If you need help quitting, ask your health care provider. Do not use street drugs. Do not share needles. Ask your health care provider for help if you need support or information about quitting drugs. Alcohol use Do not drink alcohol if your health care provider tells you not to drink. If you drink alcohol: Limit how much you have to 0-2 drinks a day. Be aware of how much alcohol is in your drink. In the [place] one drink equals one 12 oz bottle of beer (355 mL), one 5 oz glass of wine (148 mL), or one 1 oz glass of hard liquor (44 mL). General instructions Schedule regular health, dental, and eye exams. Stay current with your vaccines. Tell your health care provider if: You often feel depressed. You have ever been abused or do not feel safe at home. Summary Adopting a healthy lifestyle and getting preventive care are important in promoting health and wellness. Follow your health care provider's instructions about healthy diet, exercising, and getting tested or screened for diseases. Follow your health care provider's instructions on monitoring your cholesterol and blood pressure. This information is not intended to replace advice given to you by your health care provider. Make sure you discuss any questions you have with your health care provider. Document Revised: 11/11/2020 Document Reviewed: 08/27/2018 Elsevier Patient Education  2022 [employer]  

## 2021-06-07 ENCOUNTER — Ambulatory Visit: Payer: Medicare PPO | Admitting: Urology

## 2021-06-07 ENCOUNTER — Encounter: Payer: Self-pay | Admitting: Urology

## 2021-06-07 ENCOUNTER — Other Ambulatory Visit: Payer: Self-pay

## 2021-06-07 ENCOUNTER — Other Ambulatory Visit: Payer: Self-pay | Admitting: Urology

## 2021-06-07 VITALS — BP 131/81 | HR 76 | Ht 73.0 in | Wt 210.0 lb

## 2021-06-07 DIAGNOSIS — N401 Enlarged prostate with lower urinary tract symptoms: Secondary | ICD-10-CM | POA: Diagnosis not present

## 2021-06-07 DIAGNOSIS — E291 Testicular hypofunction: Secondary | ICD-10-CM | POA: Diagnosis not present

## 2021-06-07 LAB — TESTOSTERONE: Testosterone: 201 ng/dL — ABNORMAL LOW (ref 264–916)

## 2021-06-07 MED ORDER — MIRABEGRON ER 25 MG PO TB24: 25.0000 mg | ORAL_TABLET | Freq: Every day | ORAL | 0 refills | Status: DC

## 2021-06-07 NOTE — Progress Notes (Signed)
06/07/2021 2:32 PM   Kenneth Pruitt [birth year] [id number]  Referring provider: [name], DO [place],  [place] [id number]  Chief Complaint  Patient presents with   Hypogonadism    HPI: 70 year-old male presents for follow-up of hypogonadism.  Doing well on TRT Has noted increased urinary urgency since his last visit Remains on tamsulosin Denies dysuria, gross hematuria No flank, abdominal or pelvic pain Labs 02/21/2021: PSA 2.5; hematocrit 04/22/2021 48.7 Testosterone level 06/06/2021 was low at 201 however he was several days past due for his injection   PMH: Past Medical History:  Diagnosis Date   Arthritis    Coronary artery disease    Depression    Hyperlipidemia     Surgical History: Past Surgical History:  Procedure Laterality Date   ANKLE FRACTURE SURGERY Right    ARTHRODESIS ANT INTERBODY INC DISCECTOMY, CERVICAL BELOW C2    05/2019   CARDIAC CATHETERIZATION N/A 04/26/2015   Procedure: Left Heart Cath;  Surgeon: Dionisio David, MD;  Location: [facility];  Service: Cardiovascular;  Laterality: N/A;   CLAVICLE SURGERY Right 05/04/2021   CORONARY ANGIOPLASTY     CORONARY STENT INTERVENTION N/A 03/27/2018   Procedure: CORONARY STENT INTERVENTION;  Surgeon: Yolonda Kida, MD;  Location: [facility];  Service: Cardiovascular;  Laterality: N/A;   EYE SURGERY Bilateral    cataract   LEFT HEART CATH AND CORONARY ANGIOGRAPHY Left 03/27/2018   Procedure: Left Heart Cath with possible coronary intervention;  Surgeon: Dionisio David, MD;  Location: [facility];  Service: Cardiovascular;  Laterality: Left;   TOTAL KNEE ARTHROPLASTY Right 10/08/2018   Procedure: TOTAL KNEE ARTHROPLASTY;  Surgeon: Lovell [name], MD;  Location: [facility];  Service: Orthopedics;  Laterality: Right;    Home Medications:  Allergies as of 06/07/2021       Reactions   Clonidine Derivatives Shortness Of Breath, Other (See Comments)   Low heart  rate   Clonidine    Mild bradycardia + Somnolence        Medication List        Accurate as of June 07, 2021  2:32 PM. If you have any questions, ask your nurse or doctor.          acetaminophen 500 MG tablet Commonly known as: TYLENOL Take by mouth.   amLODipine 5 MG tablet Commonly known as: NORVASC TAKE ONE TABLET (5 MG) BY MOUTH EVERY DAY   aspirin 81 MG EC tablet Take 1 tablet (81 mg total) by mouth daily.   buPROPion 100 MG tablet Commonly known as: WELLBUTRIN Take 1 tablet (100 mg total) by mouth daily.   chlorthalidone 25 MG tablet Commonly known as: HYGROTON Take 25 mg by mouth daily.   Cholecalciferol 125 MCG (5000 UT) capsule Take 5,000 Units by mouth daily.   clopidogrel 75 MG tablet Commonly known as: PLAVIX Take 75 mg by mouth daily.   clotrimazole-betamethasone cream Commonly known as: Lotrisone Apply 1 application topically 2 (two) times daily.   diclofenac Sodium 1 % Gel Commonly known as: VOLTAREN Voltaren 1 % topical gel  APPLY 2 GRAMS TO THE AFFECTED AREA(S) BY TOPICAL ROUTE 4 TIMES PER DAY   DULoxetine 20 MG capsule Commonly known as: CYMBALTA Take 1 capsule (20 mg total) by mouth daily. Take along with 30 mg daily   DULoxetine 30 MG capsule Commonly known as: Cymbalta Take 1 capsule (30 mg total) by mouth daily. Take along with  20 mg daily   dutasteride 0.5 MG capsule Commonly known as: AVODART Take 1 capsule (0.5 mg total) by mouth every evening.   fluticasone 50 MCG/ACT nasal spray Commonly known as: FLONASE Place 2 sprays into both nostrils daily.   hydrALAZINE 100 MG tablet Commonly known as: APRESOLINE Take 50-100 mg by mouth See admin instructions. Take 50 mg by mouth in the morning and take 100 mg by mouth at bedtime   hydrocortisone 2.5 % cream as needed.   hydrocortisone 2.5 % rectal cream Commonly known as: ANUSOL-HC APPLY RECTALLY TWICE DAILY AS DIRECTED   losartan 100 MG tablet Commonly known as:  COZAAR TAKE ONE TABLET (100 MG) BY MOUTH EVERY DAY   meloxicam 15 MG tablet Commonly known as: MOBIC Take 1 tablet (15 mg total) by mouth daily.   methocarbamol 500 MG tablet Commonly known as: ROBAXIN Take by mouth.   metoprolol tartrate 25 MG tablet Commonly known as: LOPRESSOR Take 25 mg by mouth daily.   mupirocin cream 2 % Commonly known as: Bactroban Apply 1 application topically 2 (two) times daily.   niacin 1000 MG CR tablet Commonly known as: NIASPAN Take 2 tablets (2,000 mg total) by mouth at bedtime.   Omega-3 1000 MG Caps Take by mouth.   ondansetron 4 MG disintegrating tablet Commonly known as: ZOFRAN-ODT   pantoprazole 20 MG tablet Commonly known as: PROTONIX Take 20 mg by mouth daily.   rosuvastatin 20 MG tablet Commonly known as: CRESTOR Take 1 tablet (20 mg total) by mouth every evening.   tamsulosin 0.4 MG Caps capsule Commonly known as: FLOMAX Take 1 capsule (0.4 mg total) by mouth daily.   testosterone cypionate 200 MG/ML injection Commonly known as: DEPOTESTOSTERONE CYPIONATE INJECT 0.75 ML EVERY 10 DAYS AS DIRECTED   tizanidine 2 MG capsule Commonly known as: ZANAFLEX Take by mouth.   Vascepa 1 g capsule Generic drug: icosapent Ethyl   vitamin B-12 1000 MCG tablet Commonly known as: CYANOCOBALAMIN Take 1,000 mcg by mouth daily.        Allergies:  Allergies  Allergen Reactions   Clonidine Derivatives Shortness Of Breath and Other (See Comments)    Low heart rate   Clonidine     Mild bradycardia + Somnolence    Family History: Family History  Problem Relation Age of Onset   Diabetes Mother    Hypertension Mother    Cancer Father    Depression Father    Anxiety disorder Father    Depression Daughter     Social History:  reports that he quit smoking about 14 years ago. His smoking use included cigars. He has never used smokeless tobacco. He reports that he does not drink alcohol and does not use drugs.   Physical  Exam: BP 131/81   Pulse 76   Ht 6\' 1"  (1.854 m)   Wt 210 lb (95.3 kg)   BMI 27.71 kg/m   Constitutional:  Alert and oriented, No acute distress. HEENT: [place] AT, moist mucus membranes.  Trachea midline, no masses. Cardiovascular: No clubbing, cyanosis, or edema. Respiratory: Normal respiratory effort, no increased work of breathing.   Assessment & Plan:    Hypogonadism Stable on TRT Lab visit 6 months testosterone/hematocrit 1 year follow-up PSA, testosterone, hematocrit   2.  BPH with LUTS Some increased urinary urgency Trial Myrbetriq 25 mg daily-samples given  Abbie Sons, MD  [facility] [address], [place], [place] [address] [phone]

## 2021-06-13 ENCOUNTER — Encounter: Payer: Self-pay | Admitting: Urology

## 2021-06-19 ENCOUNTER — Telehealth: Payer: Self-pay | Admitting: Family Medicine

## 2021-06-19 ENCOUNTER — Ambulatory Visit: Payer: Self-pay | Admitting: *Deleted

## 2021-06-19 NOTE — Telephone Encounter (Signed)
Pt still has one wound on his knee that has not healed and he was advised to call Dr. [name] so she can send in some medication if needed / the ointment prescribed has not help much with it/ he stated it keeps trying to bleed / please advise
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
## 2021-06-20 DIAGNOSIS — S42001D Fracture of unspecified part of right clavicle, subsequent encounter for fracture with routine healing: Secondary | ICD-10-CM | POA: Diagnosis not present

## 2021-06-21 MED ORDER — DOXYCYCLINE HYCLATE 100 MG PO TABS: 100.0000 mg | ORAL_TABLET | Freq: Two times a day (BID) | ORAL | 0 refills | Status: DC

## 2021-06-21 NOTE — Addendum Note (Signed)
Addended by: [name] on: 06/21/2021 01:00 PM   Modules accepted: Orders

## 2021-06-21 NOTE — Telephone Encounter (Signed)
Rx sent to his pharmacy

## 2021-06-29 ENCOUNTER — Encounter: Payer: Self-pay | Admitting: Psychiatry

## 2021-06-29 ENCOUNTER — Other Ambulatory Visit: Payer: Self-pay

## 2021-06-29 ENCOUNTER — Telehealth (INDEPENDENT_AMBULATORY_CARE_PROVIDER_SITE_OTHER): Payer: Medicare PPO | Admitting: Psychiatry

## 2021-06-29 DIAGNOSIS — F411 Generalized anxiety disorder: Secondary | ICD-10-CM

## 2021-06-29 DIAGNOSIS — F3342 Major depressive disorder, recurrent, in full remission: Secondary | ICD-10-CM | POA: Diagnosis not present

## 2021-06-29 DIAGNOSIS — Z634 Disappearance and death of family member: Secondary | ICD-10-CM | POA: Diagnosis not present

## 2021-06-29 DIAGNOSIS — G4701 Insomnia due to medical condition: Secondary | ICD-10-CM

## 2021-06-29 MED ORDER — DULOXETINE HCL 20 MG PO CPEP: 20.0000 mg | ORAL_CAPSULE | Freq: Every day | ORAL | 1 refills | Status: DC

## 2021-06-29 MED ORDER — DULOXETINE HCL 30 MG PO CPEP: 30.0000 mg | ORAL_CAPSULE | Freq: Every day | ORAL | 1 refills | Status: DC
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
## 2021-07-03 ENCOUNTER — Other Ambulatory Visit: Payer: Self-pay | Admitting: Family Medicine

## 2021-07-03 DIAGNOSIS — E782 Mixed hyperlipidemia: Secondary | ICD-10-CM
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
## 2021-08-01 DIAGNOSIS — S42001D Fracture of unspecified part of right clavicle, subsequent encounter for fracture with routine healing: Secondary | ICD-10-CM | POA: Diagnosis not present

## 2021-08-07 DIAGNOSIS — M48061 Spinal stenosis, lumbar region without neurogenic claudication: Secondary | ICD-10-CM | POA: Diagnosis not present

## 2021-08-16 DIAGNOSIS — M5416 Radiculopathy, lumbar region: Secondary | ICD-10-CM | POA: Diagnosis not present

## 2021-08-17 DIAGNOSIS — I340 Nonrheumatic mitral (valve) insufficiency: Secondary | ICD-10-CM | POA: Diagnosis not present

## 2021-08-17 DIAGNOSIS — R55 Syncope and collapse: Secondary | ICD-10-CM | POA: Diagnosis not present

## 2021-08-17 DIAGNOSIS — E785 Hyperlipidemia, unspecified: Secondary | ICD-10-CM | POA: Diagnosis not present

## 2021-08-17 DIAGNOSIS — I351 Nonrheumatic aortic (valve) insufficiency: Secondary | ICD-10-CM | POA: Diagnosis not present

## 2021-08-17 DIAGNOSIS — I10 Essential (primary) hypertension: Secondary | ICD-10-CM | POA: Diagnosis not present

## 2021-08-17 DIAGNOSIS — I2510 Atherosclerotic heart disease of native coronary artery without angina pectoris: Secondary | ICD-10-CM | POA: Diagnosis not present

## 2021-08-23 DIAGNOSIS — I351 Nonrheumatic aortic (valve) insufficiency: Secondary | ICD-10-CM | POA: Diagnosis not present

## 2021-08-24 DIAGNOSIS — R55 Syncope and collapse: Secondary | ICD-10-CM | POA: Diagnosis not present

## 2021-08-24 DIAGNOSIS — Z955 Presence of coronary angioplasty implant and graft: Secondary | ICD-10-CM | POA: Diagnosis not present

## 2021-08-28 DIAGNOSIS — I10 Essential (primary) hypertension: Secondary | ICD-10-CM | POA: Diagnosis not present

## 2021-08-28 DIAGNOSIS — I351 Nonrheumatic aortic (valve) insufficiency: Secondary | ICD-10-CM | POA: Diagnosis not present

## 2021-08-28 DIAGNOSIS — E785 Hyperlipidemia, unspecified: Secondary | ICD-10-CM | POA: Diagnosis not present

## 2021-08-28 DIAGNOSIS — I2510 Atherosclerotic heart disease of native coronary artery without angina pectoris: Secondary | ICD-10-CM | POA: Diagnosis not present

## 2021-08-28 DIAGNOSIS — I340 Nonrheumatic mitral (valve) insufficiency: Secondary | ICD-10-CM | POA: Diagnosis not present

## 2021-08-30 ENCOUNTER — Other Ambulatory Visit: Payer: Self-pay | Admitting: Psychiatry

## 2021-08-30 DIAGNOSIS — F3342 Major depressive disorder, recurrent, in full remission: Secondary | ICD-10-CM

## 2021-08-31 ENCOUNTER — Ambulatory Visit (INDEPENDENT_AMBULATORY_CARE_PROVIDER_SITE_OTHER): Payer: Medicare PPO | Admitting: Family Medicine

## 2021-08-31 ENCOUNTER — Encounter: Payer: Self-pay | Admitting: Dermatology

## 2021-08-31 ENCOUNTER — Ambulatory Visit: Payer: Medicare PPO | Admitting: Dermatology

## 2021-08-31 ENCOUNTER — Other Ambulatory Visit: Payer: Self-pay

## 2021-08-31 ENCOUNTER — Encounter: Payer: Self-pay | Admitting: Family Medicine

## 2021-08-31 VITALS — BP 114/60 | HR 94 | Temp 98.2°F | Ht 73.0 in | Wt 214.4 lb

## 2021-08-31 DIAGNOSIS — N183 Chronic kidney disease, stage 3 unspecified: Secondary | ICD-10-CM

## 2021-08-31 DIAGNOSIS — L578 Other skin changes due to chronic exposure to nonionizing radiation: Secondary | ICD-10-CM | POA: Diagnosis not present

## 2021-08-31 DIAGNOSIS — N401 Enlarged prostate with lower urinary tract symptoms: Secondary | ICD-10-CM | POA: Diagnosis not present

## 2021-08-31 DIAGNOSIS — Z0000 Encounter for general adult medical examination without abnormal findings: Secondary | ICD-10-CM

## 2021-08-31 DIAGNOSIS — L821 Other seborrheic keratosis: Secondary | ICD-10-CM | POA: Diagnosis not present

## 2021-08-31 DIAGNOSIS — M5416 Radiculopathy, lumbar region: Secondary | ICD-10-CM

## 2021-08-31 DIAGNOSIS — D1800 Hemangioma unspecified site: Secondary | ICD-10-CM | POA: Diagnosis not present

## 2021-08-31 DIAGNOSIS — D229 Melanocytic nevi, unspecified: Secondary | ICD-10-CM | POA: Diagnosis not present

## 2021-08-31 DIAGNOSIS — R350 Frequency of micturition: Secondary | ICD-10-CM

## 2021-08-31 DIAGNOSIS — D751 Secondary polycythemia: Secondary | ICD-10-CM

## 2021-08-31 DIAGNOSIS — I2510 Atherosclerotic heart disease of native coronary artery without angina pectoris: Secondary | ICD-10-CM

## 2021-08-31 DIAGNOSIS — L820 Inflamed seborrheic keratosis: Secondary | ICD-10-CM | POA: Diagnosis not present

## 2021-08-31 DIAGNOSIS — E291 Testicular hypofunction: Secondary | ICD-10-CM | POA: Diagnosis not present

## 2021-08-31 DIAGNOSIS — F411 Generalized anxiety disorder: Secondary | ICD-10-CM

## 2021-08-31 DIAGNOSIS — Z85828 Personal history of other malignant neoplasm of skin: Secondary | ICD-10-CM | POA: Diagnosis not present

## 2021-08-31 DIAGNOSIS — L720 Epidermal cyst: Secondary | ICD-10-CM | POA: Diagnosis not present

## 2021-08-31 DIAGNOSIS — I200 Unstable angina: Secondary | ICD-10-CM

## 2021-08-31 DIAGNOSIS — Z1283 Encounter for screening for malignant neoplasm of skin: Secondary | ICD-10-CM

## 2021-08-31 DIAGNOSIS — I10 Essential (primary) hypertension: Secondary | ICD-10-CM | POA: Diagnosis not present

## 2021-08-31 DIAGNOSIS — L814 Other melanin hyperpigmentation: Secondary | ICD-10-CM | POA: Diagnosis not present

## 2021-08-31 DIAGNOSIS — R972 Elevated prostate specific antigen [PSA]: Secondary | ICD-10-CM | POA: Diagnosis not present

## 2021-08-31 DIAGNOSIS — F3342 Major depressive disorder, recurrent, in full remission: Secondary | ICD-10-CM

## 2021-08-31 DIAGNOSIS — E782 Mixed hyperlipidemia: Secondary | ICD-10-CM | POA: Diagnosis not present

## 2021-08-31 LAB — MICROALBUMIN, URINE WAIVED
Creatinine, Urine Waived: 100 mg/dL (ref 10–300)
Microalb, Ur Waived: 30 mg/L — ABNORMAL HIGH (ref 0–19)
Microalb/Creat Ratio: <30 mg/g (ref <30)

## 2021-08-31 LAB — URINALYSIS, ROUTINE W REFLEX MICROSCOPIC
Bilirubin, UA: NEGATIVE
Glucose, UA: NEGATIVE
Ketones, UA: NEGATIVE
Leukocytes,UA: NEGATIVE
Nitrite, UA: NEGATIVE
Protein,UA: NEGATIVE
RBC, UA: NEGATIVE
Specific Gravity, UA: 1.02 (ref 1.005–1.030)
Urobilinogen, Ur: 1 mg/dL (ref 0.2–1.0)
pH, UA: 7 (ref 5.0–7.5)

## 2021-08-31 MED ORDER — ASPIRIN 81 MG PO TBEC: 81.0000 mg | DELAYED_RELEASE_TABLET | Freq: Every day | ORAL | 3 refills | Status: AC

## 2021-08-31 MED ORDER — TAMSULOSIN HCL 0.4 MG PO CAPS: 0.4000 mg | ORAL_CAPSULE | Freq: Every day | ORAL | 1 refills | Status: DC

## 2021-08-31 MED ORDER — AMLODIPINE BESYLATE 5 MG PO TABS: ORAL_TABLET | ORAL | 1 refills | Status: DC

## 2021-08-31 MED ORDER — ROSUVASTATIN CALCIUM 20 MG PO TABS: 20.0000 mg | ORAL_TABLET | Freq: Every evening | ORAL | 1 refills | Status: DC

## 2021-08-31 MED ORDER — LOSARTAN POTASSIUM 100 MG PO TABS: ORAL_TABLET | ORAL | 1 refills | Status: DC

## 2021-08-31 MED ORDER — DUTASTERIDE 0.5 MG PO CAPS: 0.5000 mg | ORAL_CAPSULE | Freq: Every evening | ORAL | 1 refills | Status: DC

## 2021-08-31 NOTE — Assessment & Plan Note (Signed)
Would like a 2nd opinion for ?RFA. Will refer to [facility]. Call with any concerns. Continue to monitor.

## 2021-08-31 NOTE — Patient Instructions (Signed)

## 2021-08-31 NOTE — Assessment & Plan Note (Signed)
Under good control on current regimen. Continue current regimen. Continue to monitor. Call with any concerns. Refills given. Labs drawn today. Continue to follow with cardiology.

## 2021-08-31 NOTE — Assessment & Plan Note (Signed)
Under good control on current regimen. Continue current regimen. Continue to monitor. Call with any concerns. Refills given. Labs drawn today.   

## 2021-08-31 NOTE — Assessment & Plan Note (Signed)
Under good control on current regimen. Continue current regimen. Continue to monitor. Call with any concerns. Labs drawn today. Continue to follow with urology.

## 2021-08-31 NOTE — Assessment & Plan Note (Signed)
Rechecking labs today. Await results. Treat as needed.  °

## 2021-08-31 NOTE — Progress Notes (Signed)
BP 114/60    Pulse 94    Temp 98.2 F (36.8 C)    Ht 6\' 1"  (1.854 m)    Wt 214 lb 6.4 oz (97.3 kg)    SpO2 97%    BMI 28.29 kg/m    Subjective:    Patient ID: Kenneth Pruitt, male    DOB: 1951-04-05, 70 y.o.   MRN: [id number]  HPI: Kenneth Pruitt is a 70 y.o. male presenting on 08/31/2021 for comprehensive medical examination. Current medical complaints include:  HYPERTENSION / HYPERLIPIDEMIA Satisfied with current treatment? yes Duration of hypertension: chronic BP monitoring frequency: not checking BP medication side effects: no Past BP meds: losartan, metoprolol, amlodipine, chlorthalidone, hydralazine Duration of hyperlipidemia: chronic Cholesterol medication side effects: no Cholesterol supplements: niacin Past cholesterol medications: crestor Medication compliance: excellent compliance Aspirin: yes Recent stressors: no Recurrent headaches: no Visual changes: no Palpitations: no Dyspnea: no Chest pain: no Lower extremity edema: no Dizzy/lightheaded: no  ANXIETY/DEPRESION Duration: chronic Status:stable Anxious mood: no  Excessive worrying: no Irritability: no  Sweating: no Nausea: no Palpitations:no Hyperventilation: no Panic attacks: no Agoraphobia: no  Obscessions/compulsions: no Depressed mood: no Depression screen [facility] 2/9 08/31/2021 06/06/2021 02/21/2021 08/15/2020 04/30/2019  Decreased Interest 0 1 1 0 0  Down, Depressed, Hopeless 0 1 1 3  0  PHQ - 2 Score 0 2 2 3  0  Altered sleeping 0 0 1 0 -  Tired, decreased energy 1 1 3 3  -  Change in appetite 0 0 0 2 -  Feeling bad or failure about yourself  0 0 1 3 -  Trouble concentrating 0 0 1 0 -  Moving slowly or fidgety/restless 0 0 0 0 -  Suicidal thoughts 0 0 0 0 -  PHQ-9 Score 1 3 8 11  -  Difficult doing work/chores - Not difficult at all Somewhat difficult - -  Some encounter information is confidential and restricted. Go to Review Flowsheets activity to see all data.  Some recent data might be  hidden   Anhedonia: no Weight changes: no Insomnia: no  Hypersomnia: no Fatigue/loss of energy: yes Feelings of worthlessness: no Feelings of guilt: no Impaired concentration/indecisiveness: no Suicidal ideations: no  Crying spells: no Recent Stressors/Life Changes: no   Relationship problems: no   Family stress: no     Financial stress: no    Job stress: no    Recent death/loss: no  BPH BPH status: controlled Satisfied with current treatment?: yes Medication side effects: no Medication compliance: excellent compliance Duration: chronic Nocturia: 1-2x per night Urinary frequency:no Incomplete voiding: no Urgency: no Weak urinary stream: no Straining to start stream: no Dysuria: no Onset: gradual Severity: mild  LOW TESTOSTERONE Duration: chronic Status: stable  Satisfied with current treatment:  yes Previous testosterone therapies: Medication side effects:  no Medication compliance: excellent compliance Decreased libido: no Fatigue: no Depressed mood: no Muscle weakness: no Erectile dysfunction: no  He currently lives with: wife Interim Problems from his last visit: no  Depression Screen done today and results listed below:  Depression screen [facility] 2/9 08/31/2021 06/06/2021 02/21/2021 08/15/2020 04/30/2019  Decreased Interest 0 1 1 0 0  Down, Depressed, Hopeless 0 1 1 3  0  PHQ - 2 Score 0 2 2 3  0  Altered sleeping 0 0 1 0 -  Tired, decreased energy 1 1 3 3  -  Change in appetite 0 0 0 2 -  Feeling bad or failure about yourself  0 0 1 3 -  Trouble concentrating 0  0 1 0 -  Moving slowly or fidgety/restless 0 0 0 0 -  Suicidal thoughts 0 0 0 0 -  PHQ-9 Score 1 3 8 11  -  Difficult doing work/chores - Not difficult at all Somewhat difficult - -  Some encounter information is confidential and restricted. Go to Review Flowsheets activity to see all data.  Some recent data might be hidden     Past Medical History:  Past Medical History:  Diagnosis Date    Arthritis    Coronary artery disease    Depression    Hyperlipidemia     Surgical History:  Past Surgical History:  Procedure Laterality Date   ANKLE FRACTURE SURGERY Right    ARTHRODESIS ANT INTERBODY INC DISCECTOMY, CERVICAL BELOW C2    05/2019   CARDIAC CATHETERIZATION N/A 04/26/2015   Procedure: Left Heart Cath;  Surgeon: Dionisio David, MD;  Location: [place] [facility];  Service: Cardiovascular;  Laterality: N/A;   CLAVICLE SURGERY Right 05/04/2021   CORONARY ANGIOPLASTY     CORONARY STENT INTERVENTION N/A 03/27/2018   Procedure: CORONARY STENT INTERVENTION;  Surgeon: Yolonda Kida, MD;  Location: [place] [facility];  Service: Cardiovascular;  Laterality: N/A;   EYE SURGERY Bilateral    cataract   LEFT HEART CATH AND CORONARY ANGIOGRAPHY Left 03/27/2018   Procedure: Left Heart Cath with possible coronary intervention;  Surgeon: Dionisio David, MD;  Location: [place] [facility];  Service: Cardiovascular;  Laterality: Left;   TOTAL KNEE ARTHROPLASTY Right 10/08/2018   Procedure: TOTAL KNEE ARTHROPLASTY;  Surgeon: Lovell [name], MD;  Location: [facility];  Service: Orthopedics;  Laterality: Right;    Medications:  Current Outpatient Medications on File Prior to Visit  Medication Sig   acetaminophen (TYLENOL) 500 MG tablet Take by mouth.   buPROPion (WELLBUTRIN) 100 MG tablet TAKE ONE TABLET (100 MG) BY MOUTH DAILY   chlorthalidone (HYGROTON) 25 MG tablet Take 25 mg by mouth daily.   Cholecalciferol 125 MCG (5000 UT) capsule Take 5,000 Units by mouth daily.   clopidogrel (PLAVIX) 75 MG tablet Take 75 mg by mouth daily.   clotrimazole-betamethasone (LOTRISONE) cream Apply 1 application topically 2 (two) times daily.   diclofenac Sodium (VOLTAREN) 1 % GEL Voltaren 1 % topical gel  APPLY 2 GRAMS TO THE AFFECTED AREA(S) BY TOPICAL ROUTE 4 TIMES PER DAY   DULoxetine (CYMBALTA) 20 MG capsule Take 1 capsule (20 mg total) by mouth daily. Take along with 30 mg daily    DULoxetine (CYMBALTA) 30 MG capsule Take 1 capsule (30 mg total) by mouth daily. Take along with 20 mg daily   fluticasone (FLONASE) 50 MCG/ACT nasal spray Place 2 sprays into both nostrils daily.   hydrALAZINE (APRESOLINE) 100 MG tablet Take 50-100 mg by mouth See admin instructions. Take 50 mg by mouth in the morning and take 100 mg by mouth at bedtime   hydrocortisone (ANUSOL-HC) 2.5 % rectal cream APPLY RECTALLY TWICE DAILY AS DIRECTED   metoprolol tartrate (LOPRESSOR) 25 MG tablet Take 25 mg by mouth daily.   mupirocin cream (BACTROBAN) 2 % Apply 1 application topically 2 (two) times daily.   niacin (NIASPAN) 1000 MG CR tablet TAKE 2 TABLETS BY MOUTH AT BEDTIME   Omega-3 1000 MG CAPS Take by mouth.   pantoprazole (PROTONIX) 20 MG tablet Take 20 mg by mouth daily.    testosterone cypionate (DEPOTESTOSTERONE CYPIONATE) 200 MG/ML injection INJECT 0.75 ML EVERY 10 DAYS AS DIRECTED   VASCEPA 1 g capsule  vitamin B-12 (CYANOCOBALAMIN) 1000 MCG tablet Take 1,000 mcg by mouth daily.   meloxicam (MOBIC) 15 MG tablet Take 1 tablet (15 mg total) by mouth daily.   No current facility-administered medications on file prior to visit.    Allergies:  Allergies  Allergen Reactions   Clonidine Derivatives Shortness Of Breath and Other (See Comments)    Low heart rate   Clonidine     Mild bradycardia + Somnolence    Social History:  Social History   Socioeconomic History   Marital status: Married    Spouse name: ann   Number of children: 2   Years of education: Not on file   Highest education level: Master's degree (e.g., MA, MS, MEng, MEd, MSW, MBA)  Occupational History    Comment: retired  Tobacco Use   Smoking status: Former    Types: Cigars    Quit date: 07/18/2006    Years since quitting: 15.1   Smokeless tobacco: Never  Vaping Use   Vaping Use: Never used  Substance and Sexual Activity   Alcohol use: No   Drug use: No   Sexual activity: Not Currently  Other Topics Concern    Not on file  Social History Narrative   Not on file   Social Determinants of Health   Financial Resource Strain: Low Risk    Difficulty of Paying Living Expenses: Not hard at all  Food Insecurity: No Food Insecurity   Worried About Charity fundraiser in the Last Year: Never true   [place] in the Last Year: Never true  Transportation Needs: No Transportation Needs   Lack of Transportation (Medical): No   Lack of Transportation (Non-Medical): No  Physical Activity: Inactive   Days of Exercise per Week: 0 days   Minutes of Exercise per Session: 0 min  Stress: No Stress Concern Present   Feeling of Stress : Not at all  Social Connections: Moderately Integrated   Frequency of Communication with Friends and Family: More than three times a week   Frequency of Social Gatherings with Friends and Family: Once a week   Attends Religious Services: More than 4 times per year   Active Member of [employer] or Organizations: No   Attends Music therapist: Never   Marital Status: Married  Human resources officer Violence: Not At Risk   Fear of Current or Ex-Partner: No   Emotionally Abused: No   Physically Abused: No   Sexually Abused: No   Social History   Tobacco Use  Smoking Status Former   Types: Cigars   Quit date: 07/18/2006   Years since quitting: 15.1  Smokeless Tobacco Never   Social History   Substance and Sexual Activity  Alcohol Use No    Family History:  Family History  Problem Relation Age of Onset   Diabetes Mother    Hypertension Mother    Cancer Father    Depression Father    Anxiety disorder Father    Depression Daughter     Past medical history, surgical history, medications, allergies, family history and social history reviewed with patient today and changes made to appropriate areas of the chart.   Review of Systems  Constitutional: Negative.   HENT: Negative.    Eyes: Negative.   Respiratory: Negative.    Cardiovascular: Negative.    Gastrointestinal: Negative.   Genitourinary: Negative.   Musculoskeletal:  Positive for myalgias. Negative for back pain, falls, joint pain and neck pain.  Skin: Negative.  Neurological: Negative.   Endo/Heme/Allergies: Negative.   Psychiatric/Behavioral: Negative.    All other ROS negative except what is listed above and in the HPI.      Objective:    BP 114/60    Pulse 94    Temp 98.2 F (36.8 C)    Ht 6\' 1"  (1.854 m)    Wt 214 lb 6.4 oz (97.3 kg)    SpO2 97%    BMI 28.29 kg/m   Wt Readings from Last 3 Encounters:  08/31/21 214 lb 6.4 oz (97.3 kg)  06/07/21 210 lb (95.3 kg)  06/06/21 210 lb 3.2 oz (95.3 kg)    Physical Exam Vitals and nursing note reviewed.  Constitutional:      General: He is not in acute distress.    Appearance: Normal appearance. He is obese. He is not ill-appearing, toxic-appearing or diaphoretic.  HENT:     Head: Normocephalic and atraumatic.     Right Ear: Tympanic membrane, ear canal and external ear normal. There is no impacted cerumen.     Left Ear: Tympanic membrane, ear canal and external ear normal. There is no impacted cerumen.     Nose: Nose normal. No congestion or rhinorrhea.     Mouth/Throat:     Mouth: Mucous membranes are moist.     Pharynx: Oropharynx is clear. No oropharyngeal exudate or posterior oropharyngeal erythema.  Eyes:     General: No scleral icterus.       Right eye: No discharge.        Left eye: No discharge.     Extraocular Movements: Extraocular movements intact.     Conjunctiva/sclera: Conjunctivae normal.     Pupils: Pupils are equal, round, and reactive to light.  Neck:     Vascular: No carotid bruit.  Cardiovascular:     Rate and Rhythm: Normal rate and regular rhythm.     Pulses: Normal pulses.     Heart sounds: No murmur heard.   No friction rub. No gallop.  Pulmonary:     Effort: Pulmonary effort is normal. No respiratory distress.     Breath sounds: Normal breath sounds. No stridor. No wheezing, rhonchi  or rales.  Chest:     Chest wall: No tenderness.  Abdominal:     General: Abdomen is flat. Bowel sounds are normal. There is no distension.     Palpations: Abdomen is soft. There is no mass.     Tenderness: There is no abdominal tenderness. There is no right CVA tenderness, left CVA tenderness, guarding or rebound.     Hernia: No hernia is present.  Genitourinary:    Comments: Genital exam deferred with shared decision making Musculoskeletal:        General: No swelling, tenderness, deformity or signs of injury.     Cervical back: Normal range of motion and neck supple. No rigidity. No muscular tenderness.     Right lower leg: No edema.     Left lower leg: No edema.  Lymphadenopathy:     Cervical: No cervical adenopathy.  Skin:    General: Skin is warm and dry.     Capillary Refill: Capillary refill takes less than 2 seconds.     Coloration: Skin is not jaundiced or pale.     Findings: No bruising, erythema, lesion or rash.  Neurological:     General: No focal deficit present.     Mental Status: He is alert and oriented to person, place, and time.     Cranial  Nerves: No cranial nerve deficit.     Sensory: No sensory deficit.     Motor: No weakness.     Coordination: Coordination normal.     Gait: Gait normal.     Deep Tendon Reflexes: Reflexes normal.  Psychiatric:        Mood and Affect: Mood normal.        Behavior: Behavior normal.        Thought Content: Thought content normal.        Judgment: Judgment normal.    Results for orders placed or performed in visit on 06/06/21  Testosterone  Result Value Ref Range   Testosterone 201 (L) 264 - 916 ng/dL      Assessment & Plan:   Problem List Items Addressed This Visit       Cardiovascular and Mediastinum   Essential hypertension    Under good control on current regimen. Continue current regimen. Continue to monitor. Call with any concerns. Refills given. Labs drawn today.        Relevant Medications   amLODipine  (NORVASC) 5 MG tablet   aspirin 81 MG EC tablet   losartan (COZAAR) 100 MG tablet   rosuvastatin (CRESTOR) 20 MG tablet   Other Relevant Orders   Comprehensive metabolic panel   CBC with Differential/Platelet   TSH   Microalbumin, Urine Waived   Unstable angina (HCC)    Under good control on current regimen. Continue current regimen. Continue to monitor. Call with any concerns. Refills given. Labs drawn today. Continue to follow with cardiology.       Relevant Medications   amLODipine (NORVASC) 5 MG tablet   aspirin 81 MG EC tablet   losartan (COZAAR) 100 MG tablet   rosuvastatin (CRESTOR) 20 MG tablet   CAD (coronary artery disease)    Under good control on current regimen. Continue current regimen. Continue to monitor. Call with any concerns. Refills given. Labs drawn today. Continue to follow with cardiology.       Relevant Medications   amLODipine (NORVASC) 5 MG tablet   aspirin 81 MG EC tablet   losartan (COZAAR) 100 MG tablet   rosuvastatin (CRESTOR) 20 MG tablet     Endocrine   Hypogonadism in male    Under good control on current regimen. Continue current regimen. Continue to monitor. Call with any concerns. Labs drawn today. Continue to follow with urology.       Relevant Orders   Comprehensive metabolic panel   CBC with Differential/Platelet   Testosterone, free, total([facility])     Nervous and Auditory   Lumbar radiculopathy    Would like a 2nd opinion for ?RFA. Will refer to [facility]. Call with any concerns. Continue to monitor.       Relevant Orders   Ambulatory referral to Pain Clinic     Genitourinary   BPH (benign prostatic hyperplasia)    Under good control on current regimen. Continue current regimen. Continue to monitor. Call with any concerns. Refills given. Labs drawn today. Continue to follow with urology.      Relevant Medications   dutasteride (AVODART) 0.5 MG capsule   tamsulosin (FLOMAX) 0.4 MG CAPS capsule   Other Relevant  Orders   Comprehensive metabolic panel   CBC with Differential/Platelet   PSA   Chronic kidney disease (CKD), stage III (moderate) ([place])    Rechecking labs today. Await results. Treat as needed.       Relevant Orders   Comprehensive metabolic panel   CBC with  Differential/Platelet   Urinalysis, Routine w reflex microscopic     Other   Hyperlipidemia    Under good control on current regimen. Continue current regimen. Continue to monitor. Call with any concerns. Refills given. Labs drawn today.       Relevant Medications   amLODipine (NORVASC) 5 MG tablet   aspirin 81 MG EC tablet   losartan (COZAAR) 100 MG tablet   rosuvastatin (CRESTOR) 20 MG tablet   Other Relevant Orders   Comprehensive metabolic panel   CBC with Differential/Platelet   Lipid Panel w/o Chol/HDL Ratio   Elevated PSA    Rechecking labs today. Await results. Treat as needed.       Relevant Medications   tamsulosin (FLOMAX) 0.4 MG CAPS capsule   Polycythemia, secondary    Rechecking labs today. Await results. Treat as needed.       Relevant Orders   Comprehensive metabolic panel   CBC with Differential/Platelet   GAD (generalized anxiety disorder)    Stable. Continue to follow with psychiatry. Call with any concerns.       MDD (major depressive disorder), recurrent, in full remission ([place])    Stable. Continue to follow with psychiatry. Call with any concerns.       Other Visit Diagnoses     Routine general medical examination at a health care facility    -  Primary   Vaccines up to date. Screening labs checked today. Cologuard up to date. Continue diet and exercise. Call with any concerns.         Discussed aspirin prophylaxis for myocardial infarction prevention and decision was made to continue ASA  LABORATORY TESTING:  Health maintenance labs ordered today as discussed above.   The natural history of prostate cancer and ongoing controversy regarding screening and potential treatment  outcomes of prostate cancer has been discussed with the patient. The meaning of a false positive PSA and a false negative PSA has been discussed. He indicates understanding of the limitations of this screening test and wishes to proceed with screening PSA testing.   IMMUNIZATIONS:   - Tdap: Tetanus vaccination status reviewed: last tetanus booster within 10 years. - Influenza: Up to date - Pneumovax: Up to date - Prevnar: Up to date - COVID: Up to date - HPV: Not applicable - Shingrix vaccine: Given elsewhere  SCREENING: - Colonoscopy: Up to date  Discussed with patient purpose of the colonoscopy is to detect colon cancer at curable precancerous or early stages   PATIENT COUNSELING:    Sexuality: Discussed sexually transmitted diseases, partner selection, use of condoms, avoidance of unintended pregnancy  and contraceptive alternatives.   Advised to avoid cigarette smoking.  I discussed with the patient that most people either abstain from alcohol or drink within safe limits (<=14/week and <=4 drinks/occasion for males, <=7/weeks and <= 3 drinks/occasion for females) and that the risk for alcohol disorders and other health effects rises proportionally with the number of drinks per week and how often a drinker exceeds daily limits.  Discussed cessation/primary prevention of drug use and availability of treatment for abuse.   Diet: Encouraged to adjust caloric intake to maintain  or achieve ideal body weight, to reduce intake of dietary saturated fat and total fat, to limit sodium intake by avoiding high sodium foods and not adding table salt, and to maintain adequate dietary potassium and calcium preferably from fresh fruits, vegetables, and low-fat dairy products.    stressed the importance of regular exercise  Injury prevention: Discussed safety  belts, safety helmets, smoke detector, smoking near bedding or upholstery.   Dental health: Discussed importance of regular tooth brushing,  flossing, and dental visits.   Follow up plan: NEXT PREVENTATIVE PHYSICAL DUE IN 1 YEAR. Return in about 6 months (around 03/01/2022).

## 2021-08-31 NOTE — Assessment & Plan Note (Signed)
Under good control on current regimen. Continue current regimen. Continue to monitor. Call with any concerns. Refills given. Labs drawn today. Continue to follow with urology.

## 2021-08-31 NOTE — Assessment & Plan Note (Signed)
Stable. Continue to follow with psychiatry. Call with any concerns.  

## 2021-08-31 NOTE — Progress Notes (Signed)
° °  New Patient Visit  Subjective  Kenneth Pruitt is a 70 y.o. male who presents for the following: Annual Exam (History of skin cancer of left forearm treated by Dr Golden Pop (per patient) - UBSE today). The patient presents for Upper Body Skin Exam (UBSE) for skin cancer screening and mole check.  The patient has spots, moles and lesions to be evaluated, some may be new or changing and the patient has concerns that these could be cancer.  The following portions of the chart were reviewed this encounter and updated as appropriate:   Tobacco   Allergies   Meds   Problems   Med Hx   Surg Hx   Fam Hx      Review of Systems:  No other skin or systemic complaints except as noted in HPI or Assessment and Plan.  Objective  Well appearing patient in no apparent distress; mood and affect are within normal limits.  All skin waist up examined.  Mid Back Subcutaneous nodules x 3, all measuring ~1.0 cm.  Left forearm, left temple (17) Erythematous stuck-on, waxy papule or plaque    Assessment & Plan   Lentigines - Scattered tan macules - Due to sun exposure - Benign-appearing, observe - Recommend daily broad spectrum sunscreen SPF 30+ to sun-exposed areas, reapply every 2 hours as needed. - Call for any changes  Seborrheic Keratoses - Stuck-on, waxy, tan-brown papules and/or plaques  - Benign-appearing - Discussed benign etiology and prognosis. - Observe - Call for any changes  Melanocytic Nevi - Tan-brown and/or pink-flesh-colored symmetric macules and papules - Benign appearing on exam today - Observation - Call clinic for new or changing moles - Recommend daily use of broad spectrum spf 30+ sunscreen to sun-exposed areas.   Hemangiomas - Red papules - Discussed benign nature - Observe - Call for any changes  Actinic Damage - Chronic condition, secondary to cumulative UV/sun exposure - diffuse scaly erythematous macules with underlying dyspigmentation - Recommend  daily broad spectrum sunscreen SPF 30+ to sun-exposed areas, reapply every 2 hours as needed.  - Staying in the shade or wearing long sleeves, sun glasses (UVA+UVB protection) and wide brim hats (4-inch brim around the entire circumference of the hat) are also recommended for sun protection.  - Call for new or changing lesions.  Skin cancer screening performed today.  History of skin cancer Left Forearm  Clear. Observe for recurrence. Call clinic for new or changing lesions.  Recommend regular skin exams, daily broad-spectrum spf 30+ sunscreen use, and photoprotection.     Epidermal inclusion cyst Mid Back  Benign. Discussed excision.  Inflamed seborrheic keratosis (17) Left forearm, left temple  Destruction of lesion - Left forearm, left temple Complexity: simple   Destruction method: cryotherapy   Informed consent: discussed and consent obtained   Timeout:  patient name, date of birth, surgical site, and procedure verified Lesion destroyed using liquid nitrogen: Yes   Region frozen until ice ball extended beyond lesion: Yes   Outcome: patient tolerated procedure well with no complications   Post-procedure details: wound care instructions given    Skin cancer screening   Return in about 1 year (around 08/31/2022) for UBSE.  I, Ashok Cordia, CMA, am acting as scribe for Sarina Ser, MD . Documentation: I have reviewed the above documentation for accuracy and completeness, and I agree with the above.  Sarina Ser, MD

## 2021-09-02 LAB — CBC WITH DIFFERENTIAL/PLATELET
Basophils Absolute: 0.1 10*3/uL (ref 0.0–0.2)
Basos: 1 %
EOS (ABSOLUTE): 0.1 10*3/uL (ref 0.0–0.4)
Eos: 2 %
Hematocrit: 49.3 % (ref 37.5–51.0)
Hemoglobin: 16.6 g/dL (ref 13.0–17.7)
Immature Grans (Abs): 0 10*3/uL (ref 0.0–0.1)
Immature Granulocytes: 0 %
Lymphocytes Absolute: 1.3 10*3/uL (ref 0.7–3.1)
Lymphs: 22 %
MCH: 29.9 pg (ref 26.6–33.0)
MCHC: 33.7 g/dL (ref 31.5–35.7)
MCV: 89 fL (ref 79–97)
Monocytes Absolute: 0.5 10*3/uL (ref 0.1–0.9)
Monocytes: 8 %
Neutrophils Absolute: 4 10*3/uL (ref 1.4–7.0)
Neutrophils: 67 %
Platelets: 222 10*3/uL (ref 150–450)
RBC: 5.55 x10E6/uL (ref 4.14–5.80)
RDW: 13.6 % (ref 11.6–15.4)
WBC: 5.9 10*3/uL (ref 3.4–10.8)

## 2021-09-02 LAB — COMPREHENSIVE METABOLIC PANEL
ALT: 21 IU/L (ref 0–44)
AST: 20 IU/L (ref 0–40)
Albumin/Globulin Ratio: 2.4 — ABNORMAL HIGH (ref 1.2–2.2)
Albumin: 4.4 g/dL (ref 3.8–4.8)
Alkaline Phosphatase: 67 IU/L (ref 44–121)
BUN/Creatinine Ratio: 19 (ref 10–24)
BUN: 21 mg/dL (ref 8–27)
Bilirubin Total: 0.4 mg/dL (ref 0.0–1.2)
CO2: 24 mmol/L (ref 20–29)
Calcium: 9.2 mg/dL (ref 8.6–10.2)
Chloride: 99 mmol/L (ref 96–106)
Creatinine, Ser: 1.1 mg/dL (ref 0.76–1.27)
Globulin, Total: 1.8 g/dL (ref 1.5–4.5)
Glucose: 126 mg/dL — ABNORMAL HIGH (ref 70–99)
Potassium: 3.6 mmol/L (ref 3.5–5.2)
Sodium: 138 mmol/L (ref 134–144)
Total Protein: 6.2 g/dL (ref 6.0–8.5)
eGFR: 72 mL/min/{1.73_m2} (ref >59)

## 2021-09-02 LAB — LIPID PANEL W/O CHOL/HDL RATIO
Cholesterol, Total: 96 mg/dL — ABNORMAL LOW (ref 100–199)
HDL: 40 mg/dL (ref >39)
LDL Chol Calc (NIH): 33 mg/dL (ref 0–99)
Triglycerides: 128 mg/dL (ref 0–149)
VLDL Cholesterol Cal: 23 mg/dL (ref 5–40)

## 2021-09-02 LAB — TESTOSTERONE, FREE, TOTAL, SHBG
Sex Hormone Binding: 23.3 nmol/L (ref 19.3–76.4)
Testosterone, Free: 10.4 pg/mL (ref 6.6–18.1)
Testosterone: 543 ng/dL (ref 264–916)

## 2021-09-02 LAB — TSH: TSH: 0.939 u[IU]/mL (ref 0.450–4.500)

## 2021-09-02 LAB — PSA: Prostate Specific Ag, Serum: 2.2 ng/mL (ref 0.0–4.0)

## 2021-09-05 ENCOUNTER — Encounter: Payer: Self-pay | Admitting: Dermatology

## 2021-09-14 DIAGNOSIS — M25711 Osteophyte, right shoulder: Secondary | ICD-10-CM | POA: Diagnosis not present

## 2021-09-14 DIAGNOSIS — M25511 Pain in right shoulder: Secondary | ICD-10-CM | POA: Diagnosis not present

## 2021-09-14 DIAGNOSIS — T8484XA Pain due to internal orthopedic prosthetic devices, implants and grafts, initial encounter: Secondary | ICD-10-CM | POA: Diagnosis not present

## 2021-09-14 DIAGNOSIS — T85848A Pain due to other internal prosthetic devices, implants and grafts, initial encounter: Secondary | ICD-10-CM | POA: Diagnosis not present

## 2021-09-14 DIAGNOSIS — S42001D Fracture of unspecified part of right clavicle, subsequent encounter for fracture with routine healing: Secondary | ICD-10-CM | POA: Diagnosis not present

## 2021-09-14 DIAGNOSIS — Z955 Presence of coronary angioplasty implant and graft: Secondary | ICD-10-CM | POA: Diagnosis not present

## 2021-09-14 DIAGNOSIS — Z7902 Long term (current) use of antithrombotics/antiplatelets: Secondary | ICD-10-CM | POA: Diagnosis not present

## 2021-09-20 ENCOUNTER — Other Ambulatory Visit: Payer: Self-pay | Admitting: *Deleted

## 2021-09-20 DIAGNOSIS — D751 Secondary polycythemia: Secondary | ICD-10-CM

## 2021-09-25 ENCOUNTER — Inpatient Hospital Stay: Payer: Medicare PPO | Attending: Oncology

## 2021-09-25 ENCOUNTER — Encounter: Payer: Self-pay | Admitting: Nurse Practitioner

## 2021-09-25 ENCOUNTER — Other Ambulatory Visit: Payer: Self-pay

## 2021-09-25 ENCOUNTER — Inpatient Hospital Stay: Payer: Medicare PPO

## 2021-09-25 ENCOUNTER — Inpatient Hospital Stay: Payer: Medicare PPO | Admitting: Nurse Practitioner

## 2021-09-25 VITALS — BP 132/73 | HR 86 | Temp 97.8°F | Resp 19 | Wt 217.1 lb

## 2021-09-25 DIAGNOSIS — D751 Secondary polycythemia: Secondary | ICD-10-CM | POA: Insufficient documentation

## 2021-09-25 LAB — CBC WITH DIFFERENTIAL/PLATELET
Abs Immature Granulocytes: 0.01 10*3/uL (ref 0.00–0.07)
Basophils Absolute: 0.1 10*3/uL (ref 0.0–0.1)
Basophils Relative: 1 %
Eosinophils Absolute: 0.1 10*3/uL (ref 0.0–0.5)
Eosinophils Relative: 2 %
HCT: 48.6 % (ref 39.0–52.0)
Hemoglobin: 16.4 g/dL (ref 13.0–17.0)
Immature Granulocytes: 0 %
Lymphocytes Relative: 26 %
Lymphs Abs: 1.4 10*3/uL (ref 0.7–4.0)
MCH: 30.5 pg (ref 26.0–34.0)
MCHC: 33.7 g/dL (ref 30.0–36.0)
MCV: 90.5 fL (ref 80.0–100.0)
Monocytes Absolute: 0.7 10*3/uL (ref 0.1–1.0)
Monocytes Relative: 13 %
Neutro Abs: 3.1 10*3/uL (ref 1.7–7.7)
Neutrophils Relative %: 58 %
Platelets: 225 10*3/uL (ref 150–400)
RBC: 5.37 MIL/uL (ref 4.22–5.81)
RDW: 13.7 % (ref 11.5–15.5)
WBC: 5.4 10*3/uL (ref 4.0–10.5)
nRBC: 0 % (ref 0.0–0.2)
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
## 2021-09-25 NOTE — Progress Notes (Signed)
Hgb 16.6 . No phlebotomy required today.

## 2021-09-26 ENCOUNTER — Ambulatory Visit: Payer: Medicare PPO | Admitting: Psychiatry

## 2021-09-26 DIAGNOSIS — M898X1 Other specified disorders of bone, shoulder: Secondary | ICD-10-CM | POA: Insufficient documentation

## 2021-10-02 DIAGNOSIS — M48061 Spinal stenosis, lumbar region without neurogenic claudication: Secondary | ICD-10-CM | POA: Diagnosis not present

## 2021-10-10 DIAGNOSIS — Z96651 Presence of right artificial knee joint: Secondary | ICD-10-CM | POA: Diagnosis not present

## 2021-10-12 ENCOUNTER — Ambulatory Visit (INDEPENDENT_AMBULATORY_CARE_PROVIDER_SITE_OTHER): Payer: Medicare PPO | Admitting: Psychiatry

## 2021-10-12 ENCOUNTER — Encounter: Payer: Self-pay | Admitting: Psychiatry

## 2021-10-12 ENCOUNTER — Other Ambulatory Visit: Payer: Self-pay

## 2021-10-12 VITALS — BP 137/76 | HR 81 | Wt 219.6 lb

## 2021-10-12 DIAGNOSIS — G4701 Insomnia due to medical condition: Secondary | ICD-10-CM

## 2021-10-12 DIAGNOSIS — F411 Generalized anxiety disorder: Secondary | ICD-10-CM

## 2021-10-12 DIAGNOSIS — F330 Major depressive disorder, recurrent, mild: Secondary | ICD-10-CM | POA: Diagnosis not present

## 2021-10-12 MED ORDER — BUPROPION HCL ER (XL) 150 MG PO TB24: 150.0000 mg | ORAL_TABLET | Freq: Every day | ORAL | 0 refills | Status: DC
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
## 2021-10-12 NOTE — Patient Instructions (Signed)
Bupropion Extended-Release Tablets (Depression/Mood Disorders) What is this medication? BUPROPION (byoo PROE pee on) treats depression. It increases norepinephrine and dopamine in the brain, hormones that help regulate mood. It belongs to a group of medications called NDRIs. This medicine may be used for other purposes; ask your health care provider or pharmacist if you have questions. COMMON BRAND NAME(S): Aplenzin, Budeprion XL, Forfivo XL, Wellbutrin XL What should I tell my care team before I take this medication? They need to know if you have any of these conditions: An eating disorder, such as anorexia or bulimia Bipolar disorder or psychosis Diabetes or high blood sugar, treated with medication Glaucoma Head injury or brain tumor Heart disease, previous heart attack, or irregular heart beat High blood pressure Kidney or liver disease Seizures (convulsions) Suicidal thoughts or a previous suicide attempt Tourette's syndrome Weight loss An unusual or allergic reaction to bupropion, other medications, foods, dyes, or preservatives Pregnant or trying to become pregnant Breast-feeding How should I use this medication? Take this medication by mouth with a glass of water. Follow the directions on the prescription label. You can take it with or without food. If it upsets your stomach, take it with food. Do not crush, chew, or cut these tablets. This medication is taken once daily at the same time each day. Do not take your medication more often than directed. Do not stop taking this medication suddenly except upon the advice of your care team. Stopping this medication too quickly may cause serious side effects or your condition may worsen. A special MedGuide will be given to you by the pharmacist with each prescription and refill. Be sure to read this information carefully each time. Talk to your care team about the use of this medication in children. Special care may be needed. Overdosage:  If you think you have taken too much of this medicine contact a poison control center or emergency room at once. NOTE: This medicine is only for you. Do not share this medicine with others. What if I miss a dose? If you miss a dose, skip the missed dose and take your next tablet at the regular time. Do not take double or extra doses. What may interact with this medication? Do not take this medication with any of the following: Linezolid MAOIs like Azilect, Carbex, Eldepryl, Marplan, Nardil, and Parnate Methylene blue (injected into a vein) Other medications that contain bupropion like Zyban This medication may also interact with the following: Alcohol Certain medications for anxiety or sleep Certain medications for blood pressure like metoprolol, propranolol Certain medications for depression or psychotic disturbances Certain medications for HIV or AIDS like efavirenz, lopinavir, nelfinavir, ritonavir Certain medications for irregular heart beat like propafenone, flecainide Certain medications for Parkinson's disease like amantadine, levodopa Certain medications for seizures like carbamazepine, phenytoin, phenobarbital Cimetidine Clopidogrel Cyclophosphamide Digoxin Furazolidone Isoniazid Nicotine Orphenadrine Procarbazine Steroid medications like prednisone or cortisone Stimulant medications for attention disorders, weight loss, or to stay awake Tamoxifen Theophylline Thiotepa Ticlopidine Tramadol Warfarin This list may not describe all possible interactions. Give your health care provider a list of all the medicines, herbs, non-prescription drugs, or dietary supplements you use. Also tell them if you smoke, drink alcohol, or use illegal drugs. Some items may interact with your medicine. What should I watch for while using this medication? Tell your care team if your symptoms do not get better or if they get worse. Visit your care team for regular checks on your progress.  Because it may take several weeks  to see the full effects of this medication, it is important to continue your treatment as prescribed. Watch for new or worsening thoughts of suicide or depression. This includes sudden changes in mood, behavior, or thoughts. These changes can happen at any time but are more common in the beginning of treatment or after a change in dose. Call your care team right away if you experience these thoughts or worsening depression. Manic episodes may happen in patients with bipolar disorder who take this medication. Watch for changes in feelings or behaviors such as feeling anxious, nervous, agitated, panicky, irritable, hostile, aggressive, impulsive, severely restless, overly excited and hyperactive, or trouble sleeping. These symptoms can happen at anytime but are more common in the beginning of treatment or after a change in dose. Call your care team right away if you notice any of these symptoms. This medication may cause serious skin reactions. They can happen weeks to months after starting the medication. Contact your care team right away if you notice fevers or flu-like symptoms with a rash. The rash may be red or purple and then turn into blisters or peeling of the skin. Or, you might notice a red rash with swelling of the face, lips or lymph nodes in your neck or under your arms. Avoid drinks that contain alcohol while taking this medication. Drinking large amounts of alcohol, using sleeping or anxiety medications, or quickly stopping the use of these agents while taking this medication may increase your risk for a seizure. Do not drive or use heavy machinery until you know how this medication affects you. This medication can impair your ability to perform these tasks. Do not take this medication close to bedtime. It may prevent you from sleeping. Your mouth may get dry. Chewing sugarless gum or sucking hard candy, and drinking plenty of water may help. Contact your care  team if the problem does not go away or is severe. The tablet shell for some brands of this medication does not dissolve. This is normal. The tablet shell may appear whole in the stool. This is not a cause for concern. What side effects may I notice from receiving this medication? Side effects that you should report to your care team as soon as possible: Allergic reactions--skin rash, itching, hives, swelling of the face, lips, tongue, or throat Increase in blood pressure Mood and behavior changes--anxiety, nervousness, confusion, hallucinations, irritability, hostility, thoughts of suicide or self-harm, worsening mood, feelings of depression Redness, blistering, peeling, or loosening of the skin, including inside the mouth Seizures Sudden eye pain or change in vision such as blurry vision, seeing halos around lights, vision loss Side effects that usually do not require medical attention (report to your care team if they continue or are bothersome): Constipation Dizziness Dry mouth Loss of appetite Nausea Tremors or shaking Trouble sleeping This list may not describe all possible side effects. Call your doctor for medical advice about side effects. You may report side effects to FDA at 1-800-FDA-1088. Where should I keep my medication? Keep out of the reach of children and pets. Store at room temperature between 15 and 30 degrees C (59 and 86 degrees F). Throw away any unused medication after the expiration date. NOTE: This sheet is a summary. It may not cover all possible information. If you have questions about this medicine, talk to your doctor, pharmacist, or health care provider.  2022 Elsevier/Gold Standard (2020-11-16 00:00:00)

## 2021-10-13 ENCOUNTER — Other Ambulatory Visit: Payer: Self-pay | Admitting: Urology

## 2021-10-19 ENCOUNTER — Other Ambulatory Visit: Payer: Self-pay | Admitting: Psychiatry

## 2021-10-19 DIAGNOSIS — F411 Generalized anxiety disorder: Secondary | ICD-10-CM

## 2021-11-14 ENCOUNTER — Encounter: Payer: Self-pay | Admitting: Psychiatry

## 2021-11-14 ENCOUNTER — Ambulatory Visit (INDEPENDENT_AMBULATORY_CARE_PROVIDER_SITE_OTHER): Payer: Medicare PPO | Admitting: Psychiatry

## 2021-11-14 ENCOUNTER — Other Ambulatory Visit: Payer: Self-pay

## 2021-11-14 VITALS — BP 153/78 | HR 83 | Temp 97.6°F | Wt 222.8 lb

## 2021-11-14 DIAGNOSIS — F3342 Major depressive disorder, recurrent, in full remission: Secondary | ICD-10-CM | POA: Diagnosis not present

## 2021-11-14 DIAGNOSIS — G4701 Insomnia due to medical condition: Secondary | ICD-10-CM | POA: Diagnosis not present

## 2021-11-14 DIAGNOSIS — F411 Generalized anxiety disorder: Secondary | ICD-10-CM

## 2021-11-14 NOTE — Progress Notes (Signed)
[facility] MD OP Progress Note  11/14/2021 9:37 AM Kenneth Pruitt  MRN:  [id number]  Chief Complaint:  Chief Complaint  Patient presents with   Follow-up: 71 year old Caucasian male with history of MDD, GAD, insomnia, chronic pain presented for medication management.   HPI: Kenneth Pruitt is a 71 year old Caucasian male, retired, married, lives in [place], has a history of MDD, GAD, insomnia, coronary artery disease, stent placement, knee pain, testosterone deficiency was evaluated in office today.  Patient today reports he continues to have pain likely due to nerve damage of  lower extremities resulting from his past history of injury.  However reports the Cymbalta was beneficial.  He also reports applying pressure or heat also helps.  During the day he is able to distract himself by doing activities however at night the pain does affect him. Although it is more manageable at this time.  Reports sleep is overall okay.  Does have anxiety mostly about his health as well as the fact that he is aging.  However has been practicing mindfulness, as well as listening to music and going on bike rides which helps.  Does have hydroxyzine available however has not been using it.  Compliant on Cymbalta and the higher dosage of Wellbutrin.  Denies any side effects.  Reports the higher dosage of Wellbutrin does help with energy and motivation during the day.  Denies any suicidality, homicidality or perceptual disturbances.  Patient denies any other concerns today.  Visit Diagnosis:    ICD-10-CM   1. MDD (major depressive disorder), recurrent, in full remission ([place])  F33.42     2. GAD (generalized anxiety disorder)  F41.1     3. Insomnia due to medical condition  G47.01    mood      Past Psychiatric History: Reviewed past psychiatric history from progress note on 12/16/2017.  Past Medical History:  Past Medical History:  Diagnosis Date   Arthritis    Coronary artery disease    Depression     Hyperlipidemia    Skin cancer    left forearm treated years ago by Dr. Golden Pop    Past Surgical History:  Procedure Laterality Date   ANKLE FRACTURE SURGERY Right    ARTHRODESIS ANT INTERBODY INC DISCECTOMY, CERVICAL BELOW C2    05/2019   CARDIAC CATHETERIZATION N/A 04/26/2015   Procedure: Left Heart Cath;  Surgeon: Dionisio David, MD;  Location: [facility];  Service: Cardiovascular;  Laterality: N/A;   CLAVICLE SURGERY Right 05/04/2021   CORONARY ANGIOPLASTY     CORONARY STENT INTERVENTION N/A 03/27/2018   Procedure: CORONARY STENT INTERVENTION;  Surgeon: Yolonda Kida, MD;  Location: [facility];  Service: Cardiovascular;  Laterality: N/A;   EYE SURGERY Bilateral    cataract   LEFT HEART CATH AND CORONARY ANGIOGRAPHY Left 03/27/2018   Procedure: Left Heart Cath with possible coronary intervention;  Surgeon: Dionisio David, MD;  Location: [facility];  Service: Cardiovascular;  Laterality: Left;   TOTAL KNEE ARTHROPLASTY Right 10/08/2018   Procedure: TOTAL KNEE ARTHROPLASTY;  Surgeon: Lovell [name], MD;  Location: [facility];  Service: Orthopedics;  Laterality: Right;    Family Psychiatric History: Reviewed family psychiatric history from progress note on 12/16/2017.  Family History:  Family History  Problem Relation Age of Onset   Diabetes Mother    Hypertension Mother    Cancer Father    Depression Father    Anxiety disorder Father    Depression Daughter  Social History: Reviewed social history from progress note on 12/16/2017. Social History   Socioeconomic History   Marital status: Married    Spouse name: ann   Number of children: 2   Years of education: Not on file   Highest education level: Master's degree (e.g., MA, MS, MEng, MEd, MSW, MBA)  Occupational History    Comment: retired  Tobacco Use   Smoking status: Former    Types: Cigars    Quit date: 07/18/2006    Years since quitting: 15.3   Smokeless tobacco: Never   Vaping Use   Vaping Use: Never used  Substance and Sexual Activity   Alcohol use: No   Drug use: No   Sexual activity: Not Currently  Other Topics Concern   Not on file  Social History Narrative   Not on file   Social Determinants of Health   Financial Resource Strain: Low Risk    Difficulty of Paying Living Expenses: Not hard at all  Food Insecurity: No Food Insecurity   Worried About Charity fundraiser in the Last Year: Never true   Ran Out of Food in the Last Year: Never true  Transportation Needs: No Transportation Needs   Lack of Transportation (Medical): No   Lack of Transportation (Non-Medical): No  Physical Activity: Inactive   Days of Exercise per Week: 0 days   Minutes of Exercise per Session: 0 min  Stress: No Stress Concern Present   Feeling of Stress : Not at all  Social Connections: Moderately Integrated   Frequency of Communication with Friends and Family: More than three times a week   Frequency of Social Gatherings with Friends and Family: Once a week   Attends Religious Services: More than 4 times per year   Active Member of [employer] or Organizations: No   Attends Archivist Meetings: Never   Marital Status: Married    Allergies:  Allergies  Allergen Reactions   Clonidine Derivatives Shortness Of Breath and Other (See Comments)    Low heart rate   Clonidine     Mild bradycardia + Somnolence    Metabolic Disorder Labs: Lab Results  Component Value Date   HGBA1C 5.5 08/15/2020   No results found for: PROLACTIN Lab Results  Component Value Date   CHOL 96 (L) 08/31/2021   TRIG 128 08/31/2021   HDL 40 08/31/2021   CHOLHDL 2.5 08/20/2019   VLDL 37 03/28/2018   LDLCALC 33 08/31/2021   LDLCALC 24 02/21/2021   Lab Results  Component Value Date   TSH 0.939 08/31/2021   TSH 1.020 02/21/2021    Therapeutic Level Labs: No results found for: LITHIUM No results found for: VALPROATE No components found for:  CBMZ  Current  Medications: Current Outpatient Medications  Medication Sig Dispense Refill   acetaminophen (TYLENOL) 500 MG tablet Take by mouth.     amLODipine (NORVASC) 5 MG tablet TAKE ONE TABLET (5 MG) BY MOUTH EVERY DAY 90 tablet 1   aspirin 81 MG EC tablet Take 1 tablet (81 mg total) by mouth daily. 90 tablet 3   buPROPion (WELLBUTRIN XL) 150 MG 24 hr tablet Take 1 tablet (150 mg total) by mouth daily with breakfast. 90 tablet 0   chlorthalidone (HYGROTON) 25 MG tablet Take 25 mg by mouth daily.     Cholecalciferol 125 MCG (5000 UT) capsule Take 5,000 Units by mouth daily.     clopidogrel (PLAVIX) 75 MG tablet Take 75 mg by mouth daily.  clotrimazole-betamethasone (LOTRISONE) cream Apply 1 application topically 2 (two) times daily. 30 g 0   diclofenac Sodium (VOLTAREN) 1 % GEL Voltaren 1 % topical gel  APPLY 2 GRAMS TO THE AFFECTED AREA(S) BY TOPICAL ROUTE 4 TIMES PER DAY     DULoxetine (CYMBALTA) 20 MG capsule Take 1 capsule (20 mg total) by mouth daily. Take along with 30 mg daily 90 capsule 1   DULoxetine (CYMBALTA) 30 MG capsule Take 1 capsule (30 mg total) by mouth daily. Take along with 20 mg daily 90 capsule 1   dutasteride (AVODART) 0.5 MG capsule Take 1 capsule (0.5 mg total) by mouth every evening. 90 capsule 1   fluticasone (FLONASE) 50 MCG/ACT nasal spray Place 2 sprays into both nostrils daily. 16 g 12   hydrALAZINE (APRESOLINE) 100 MG tablet Take 50 mg by mouth daily.     hydrocortisone (ANUSOL-HC) 2.5 % rectal cream APPLY RECTALLY TWICE DAILY AS DIRECTED 30 g 0   losartan (COZAAR) 100 MG tablet TAKE ONE TABLET (100 MG) BY MOUTH EVERY DAY 90 tablet 1   meloxicam (MOBIC) 15 MG tablet Take 1 tablet (15 mg total) by mouth daily. 90 tablet 1   metoprolol tartrate (LOPRESSOR) 25 MG tablet Take 25 mg by mouth daily.     mupirocin cream (BACTROBAN) 2 % Apply 1 application topically 2 (two) times daily. 30 g 2   niacin (NIASPAN) 1000 MG CR tablet TAKE 2 TABLETS BY MOUTH AT BEDTIME 180 tablet 2    Omega-3 1000 MG CAPS Take by mouth.     pantoprazole (PROTONIX) 20 MG tablet Take 20 mg by mouth daily.      rosuvastatin (CRESTOR) 20 MG tablet Take 1 tablet (20 mg total) by mouth every evening. (Patient taking differently: Take 40 mg by mouth every evening.) 90 tablet 1   tamsulosin (FLOMAX) 0.4 MG CAPS capsule Take 1 capsule (0.4 mg total) by mouth daily. 90 capsule 1   testosterone cypionate (DEPOTESTOSTERONE CYPIONATE) 200 MG/ML injection INJECT 0.75 ML EVERY 10 DAYS AS DIRECTED 10 mL 0   VASCEPA 1 g capsule      vitamin B-12 (CYANOCOBALAMIN) 1000 MCG tablet Take 1,000 mcg by mouth daily.     hydrOXYzine (ATARAX) 25 MG tablet Take 25 mg by mouth every 8 (eight) hours as needed. (Patient not taking: Reported on 11/14/2021)     No current facility-administered medications for this visit.     Musculoskeletal: Strength & Muscle Tone: within normal limits Gait & Station:  slow Patient leans: N/A  Psychiatric Specialty Exam: Review of Systems  Musculoskeletal:        Lower extremities pain - chronic  Psychiatric/Behavioral:  The patient is nervous/anxious.   All other systems reviewed and are negative.  Blood pressure (!) 153/78, pulse 83, temperature 97.6 F (36.4 C), temperature source Temporal, weight 222 lb 12.8 oz (101.1 kg).Body mass index is 29.39 kg/m.  General Appearance: Casual  Eye Contact:  Good  Speech:  Clear and Coherent  Volume:  Normal  Mood:  Anxious coping well  Affect:  Appropriate  Thought Process:  Goal Directed and Descriptions of Associations: Intact  Orientation:  Full (Time, Place, and Person)  Thought Content: Logical   Suicidal Thoughts:  No  Homicidal Thoughts:  No  Memory:  Immediate;   Fair Recent;   Fair Remote;   Fair  Judgement:  Fair  Insight:  Fair  Psychomotor Activity:  Normal  Concentration:  Concentration: Fair and Attention Span: Fair  Recall:  Fair  Fund of Knowledge: Fair  Language: Fair  Akathisia:  No  Handed:  Right   AIMS (if indicated): not done  Assets:  Communication Skills Desire for Improvement Housing Intimacy Social Support  ADL's:  Intact  Cognition: WNL  Sleep:   fair   Screenings: GAD-7    Flowsheet Row Office Visit from 08/31/2021 in [facility] Video Visit from 06/29/2021 in [facility] Video Visit from 04/17/2021 in [facility] Office Visit from 02/21/2021 in [facility] Video Visit from 02/09/2021 in [facility]  Total GAD-7 Score 1 0 6 5 4       PHQ2-9    [facility] Visit from 11/14/2021 in [facility] Office Visit from 10/12/2021 in [facility] Office Visit from 08/31/2021 in [facility] Video Visit from 06/29/2021 in [facility] from 06/06/2021 in [facility]  PHQ-2 Total Score 2 2 0 1 2  PHQ-9 Total Score 7 5 1 2 3       [facility] Office Visit from 10/12/2021 in [facility] ED from 04/22/2021 in [facility] No Risk No Risk        Assessment and Plan: Kenneth Pruitt is a 71 year old Caucasian male who has a history of MDD, anxiety disorder, multiple medical problems including recent orthopedic surgery for fracture of clavicle, hypertension, coronary artery disease, status post stent placement, hyperlipidemia, BPH and chronic pain, was evaluated in office today.  Patient is currently improving although he continues to have neuropathy pain as well as low energy although improving on the current medication regimen.  Plan as noted below.  Plan MDD in remission Wellbutrin XL 150 mg p.o. daily Cymbalta 50 mg p.o. daily  GAD-stable Cymbalta 50 mg p.o. daily Continue practicing mindfulness.  Insomnia-stable Cymbalta 50 mg p.o. daily Continue  pain management.  Follow-up in clinic in 3 months or sooner if needed.   Consent: Patient/Guardian gives verbal consent for treatment and assignment of benefits for services provided during this visit. Patient/Guardian expressed understanding and agreed to proceed.   This note was generated in part or whole with voice recognition software. Voice recognition is usually quite accurate but there are transcription errors that can and very often do occur. I apologize for any typographical errors that were not detected and corrected.  ]    Ursula Alert, MD 11/14/2021, 1:00 PM

## 2021-11-16 ENCOUNTER — Other Ambulatory Visit: Payer: Self-pay | Admitting: Family Medicine

## 2021-11-16 NOTE — Telephone Encounter (Signed)
Requested Prescriptions  ?Pending Prescriptions Disp Refills  ?? meloxicam (MOBIC) 15 MG tablet [Pharmacy Med Name: MELOXICAM 15 MG TAB] 90 tablet 1  ?  Sig: TAKE 1 TABLET BY MOUTH DAILY  ?  ? Analgesics:  COX2 Inhibitors Failed - 11/16/2021  9:42 AM  ?  ?  Failed - Manual Review: Labs are only required if the patient has taken medication for more than 8 weeks.  ?  ?  Passed - HGB in normal range and within 360 days  ?  Hemoglobin  ?Date Value Ref Range Status  ?09/25/2021 16.4 13.0 - 17.0 g/dL Final  ?08/31/2021 16.6 13.0 - 17.7 g/dL Final  ?   ?  ?  Passed - Cr in normal range and within 360 days  ?  Creatinine, Ser  ?Date Value Ref Range Status  ?08/31/2021 1.10 0.76 - 1.27 mg/dL Final  ?   ?  ?  Passed - HCT in normal range and within 360 days  ?  HCT  ?Date Value Ref Range Status  ?09/25/2021 48.6 39.0 - 52.0 % Final  ? ?Hematocrit  ?Date Value Ref Range Status  ?08/31/2021 49.3 37.5 - 51.0 % Final  ?   ?  ?  Passed - AST in normal range and within 360 days  ?  AST  ?Date Value Ref Range Status  ?08/31/2021 20 0 - 40 IU/L Final  ? ?AST (SGOT) Piccolo, Waived  ?Date Value Ref Range Status  ?08/06/2016 28 11 - 38 U/L Final  ?   ?  ?  Passed - ALT in normal range and within 360 days  ?  ALT  ?Date Value Ref Range Status  ?08/31/2021 21 0 - 44 IU/L Final  ? ?ALT (SGPT) Piccolo, Waived  ?Date Value Ref Range Status  ?08/06/2016 23 10 - 47 U/L Final  ?   ?  ?  Passed - eGFR is 30 or above and within 360 days  ?  GFR calc Af Amer  ?Date Value Ref Range Status  ?08/15/2020 75 >59 mL/min/1.73 Final  ?  Comment:  ?  **In accordance with recommendations from the NKF-ASN Task force,** ?  [facility] is in the process of updating its eGFR calculation to the ?  2021 CKD-EPI creatinine equation that estimates kidney function ?  without a race variable. ?  ? ?GFR, Estimated  ?Date Value Ref Range Status  ?04/22/2021 57 (L) >60 mL/min Final  ?  Comment:  ?  (NOTE) ?Calculated using the CKD-EPI Creatinine Equation (2021) ?  ? ?eGFR   ?Date Value Ref Range Status  ?08/31/2021 72 >59 mL/min/1.73 Final  ?   ?  ?  Passed - Patient is not pregnant  ?  ?  Passed - Valid encounter within last 12 months  ?  Recent Outpatient Visits   ?      ? 2 months ago Routine general medical examination at a health care facility  ? [place] P, DO  ? 5 months ago Abrasion of right knee, initial encounter  ? [place], [name], DO  ? 8 months ago Essential hypertension  ? [place], [name], DO  ? 1 year ago Essential hypertension  ? [place], DO  ? 1 year ago Routine general medical examination at a health care facility  ? [place], [name], DO  ?  ?  ?Future Appointments   ?        ?  In 3 months [name], [name], DO [place]  ? In 6 months [name], [name], MD [place]  ? In 9 months [name], MD [place]  ?  ? ?  ?  ?  ? ? ?

## 2021-12-04 ENCOUNTER — Other Ambulatory Visit: Payer: Self-pay | Admitting: Family Medicine

## 2021-12-05 ENCOUNTER — Other Ambulatory Visit: Payer: Self-pay

## 2021-12-05 ENCOUNTER — Other Ambulatory Visit: Payer: Medicare PPO

## 2021-12-05 DIAGNOSIS — E291 Testicular hypofunction: Secondary | ICD-10-CM | POA: Diagnosis not present

## 2021-12-05 NOTE — Telephone Encounter (Signed)
Requested medication (s) are due for refill today: expired medication ? ?Requested medication (s) are on the active medication list: yes ? ?Last refill:  02/12/20 #16 g 12 refills ? ?Future visit scheduled: yes in 2 months ? ?Notes to clinic:  expired medication. Do you want to renew Rx? ? ? ?  ?Requested Prescriptions  ?Pending Prescriptions Disp Refills  ? fluticasone (FLONASE) 50 MCG/ACT nasal spray [Pharmacy Med Name: FLUTICASONE PROPIONATE 50 MCG/ACT N] 16 g 12  ?  Sig: Place 2 sprays into both nostrils daily.  ?  ? Not Delegated - Ear, Nose, and Throat: Nasal Preparations - Corticosteroids Failed - 12/04/2021  9:31 AM  ?  ?  Failed - This refill cannot be delegated  ?  ?  Passed - Valid encounter within last 12 months  ?  Recent Outpatient Visits   ? ?      ? 3 months ago Routine general medical examination at a health care facility  ? [place] P, DO  ? 6 months ago Abrasion of right knee, initial encounter  ? [place], [name], DO  ? 9 months ago Essential hypertension  ? [place], [name], DO  ? 1 year ago Essential hypertension  ? [place], DO  ? 1 year ago Routine general medical examination at a health care facility  ? [place], [name], DO  ? ?  ?  ?Future Appointments   ? ?        ? In 2 months [name], [name], DO [place]  ? In 6 months [name], [name], MD [place]  ? In 9 months [name], MD [place]  ? ?  ? ?  ?  ?  ? ?

## 2021-12-06 ENCOUNTER — Encounter: Payer: Self-pay | Admitting: Urology

## 2021-12-06 LAB — TESTOSTERONE: Testosterone: 478 ng/dL (ref 264–916)

## 2021-12-06 LAB — HEMATOCRIT: Hematocrit: 52.2 % — ABNORMAL HIGH (ref 37.5–51.0)

## 2021-12-08 DIAGNOSIS — M79671 Pain in right foot: Secondary | ICD-10-CM | POA: Diagnosis not present

## 2021-12-08 DIAGNOSIS — M79672 Pain in left foot: Secondary | ICD-10-CM | POA: Diagnosis not present

## 2021-12-12 ENCOUNTER — Ambulatory Visit
Payer: Medicare PPO | Attending: Student in an Organized Health Care Education/Training Program | Admitting: Student in an Organized Health Care Education/Training Program

## 2021-12-12 ENCOUNTER — Encounter: Payer: Self-pay | Admitting: Student in an Organized Health Care Education/Training Program

## 2021-12-12 ENCOUNTER — Other Ambulatory Visit: Payer: Self-pay

## 2021-12-12 VITALS — BP 139/70 | HR 99 | Temp 97.3°F | Resp 18 | Ht 73.0 in | Wt 220.0 lb

## 2021-12-12 DIAGNOSIS — Z96651 Presence of right artificial knee joint: Secondary | ICD-10-CM | POA: Diagnosis not present

## 2021-12-12 DIAGNOSIS — M792 Neuralgia and neuritis, unspecified: Secondary | ICD-10-CM | POA: Insufficient documentation

## 2021-12-12 DIAGNOSIS — Z8042 Family history of malignant neoplasm of prostate: Secondary | ICD-10-CM | POA: Diagnosis not present

## 2021-12-12 DIAGNOSIS — N183 Chronic kidney disease, stage 3 unspecified: Secondary | ICD-10-CM

## 2021-12-12 DIAGNOSIS — Z981 Arthrodesis status: Secondary | ICD-10-CM

## 2021-12-12 NOTE — Progress Notes (Signed)
Safety precautions to be maintained throughout the outpatient stay will include: orient to surroundings, keep bed in low position, maintain call bell within reach at all times, provide assistance with transfer out of bed and ambulation.  
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
## 2021-12-19 DIAGNOSIS — M2041 Other hammer toe(s) (acquired), right foot: Secondary | ICD-10-CM | POA: Diagnosis not present

## 2021-12-19 DIAGNOSIS — M2042 Other hammer toe(s) (acquired), left foot: Secondary | ICD-10-CM | POA: Diagnosis not present

## 2021-12-19 DIAGNOSIS — I2510 Atherosclerotic heart disease of native coronary artery without angina pectoris: Secondary | ICD-10-CM | POA: Diagnosis not present

## 2021-12-19 DIAGNOSIS — M79672 Pain in left foot: Secondary | ICD-10-CM | POA: Diagnosis not present

## 2021-12-19 DIAGNOSIS — M79671 Pain in right foot: Secondary | ICD-10-CM | POA: Diagnosis not present

## 2021-12-19 DIAGNOSIS — M792 Neuralgia and neuritis, unspecified: Secondary | ICD-10-CM | POA: Diagnosis not present

## 2021-12-26 DIAGNOSIS — I2510 Atherosclerotic heart disease of native coronary artery without angina pectoris: Secondary | ICD-10-CM | POA: Diagnosis not present

## 2021-12-26 DIAGNOSIS — I340 Nonrheumatic mitral (valve) insufficiency: Secondary | ICD-10-CM | POA: Diagnosis not present

## 2021-12-26 DIAGNOSIS — R0602 Shortness of breath: Secondary | ICD-10-CM | POA: Diagnosis not present

## 2021-12-26 DIAGNOSIS — E782 Mixed hyperlipidemia: Secondary | ICD-10-CM | POA: Diagnosis not present

## 2021-12-26 DIAGNOSIS — I351 Nonrheumatic aortic (valve) insufficiency: Secondary | ICD-10-CM | POA: Diagnosis not present

## 2021-12-26 DIAGNOSIS — I10 Essential (primary) hypertension: Secondary | ICD-10-CM | POA: Diagnosis not present

## 2022-01-08 ENCOUNTER — Other Ambulatory Visit: Payer: Self-pay | Admitting: Psychiatry

## 2022-01-08 DIAGNOSIS — F330 Major depressive disorder, recurrent, mild: Secondary | ICD-10-CM

## 2022-01-15 DIAGNOSIS — S300XXA Contusion of lower back and pelvis, initial encounter: Secondary | ICD-10-CM | POA: Diagnosis not present

## 2022-01-17 DIAGNOSIS — G959 Disease of spinal cord, unspecified: Secondary | ICD-10-CM | POA: Diagnosis not present

## 2022-01-19 ENCOUNTER — Other Ambulatory Visit: Payer: Self-pay | Admitting: Neurosurgery

## 2022-01-19 DIAGNOSIS — G959 Disease of spinal cord, unspecified: Secondary | ICD-10-CM

## 2022-01-22 ENCOUNTER — Ambulatory Visit (INDEPENDENT_AMBULATORY_CARE_PROVIDER_SITE_OTHER): Payer: Medicare PPO | Admitting: Surgery

## 2022-01-22 ENCOUNTER — Encounter: Payer: Self-pay | Admitting: Surgery

## 2022-01-22 VITALS — BP 153/86 | HR 81 | Temp 98.0°F | Ht 73.0 in | Wt 221.0 lb

## 2022-01-22 DIAGNOSIS — S300XXA Contusion of lower back and pelvis, initial encounter: Secondary | ICD-10-CM

## 2022-01-22 NOTE — Progress Notes (Signed)
?01/22/2022 ? ?Reason for Visit:  Right buttocks hematoma ? ?Requesting Provider:  Gerrit Halls, PA-C ? ?History of Present Illness: ?Kenneth Pruitt is a 71 y.o. male presenting for evaluation of a right buttocks hematoma.  The patient reports that he suffered a fall on 01/14/2022 where he landed on his back and buttocks against a brick wall.  Patient reports that he has had some balance issues recently and has a history of neck surgery and fusion in 2020.  He has an MRI of his cervical spine scheduled for next week.  When he fell, he was having low back pain as well as right buttocks pain.  He presented to [facility] where he was evaluated.  He had x-rays of his lumbar spine which did not show any acute fractures.  However he was noted to have a very extensive significant hematoma to the right buttocks.  He was referred to [place] for further evaluation and possible management if needed.  The patient reports that since his fall, he has noticed some of the swelling has improved although initially he had an area that was very swollen in the upper right buttocks that he could see bulging underneath his clothes.  He also reports that the bruising was very purple in color and he feels that it has expanded since the fall although now some of the colors are changing.  Reports that the pain is also improving but there is still persistent soreness particular in the area of the most swelling.  Denies any drainage, fevers, chills. ? ?Of note, he has a history of 3 coronary stents and is currently on Plavix.  He has not stopped his Plavix after the fall. ? ?Past Medical History: ?Past Medical History:  ?Diagnosis Date  ? Arthritis   ? Coronary artery disease   ? Depression   ? Hyperlipidemia   ? Skin cancer   ? left forearm treated years ago by Dr. Golden Pop  ?  ? ?Past Surgical History: ?Past Surgical History:  ?Procedure Laterality Date  ? ANKLE FRACTURE SURGERY Right   ? ARTHRODESIS ANT INTERBODY INC DISCECTOMY, CERVICAL  BELOW C2    05/2019  ? CARDIAC CATHETERIZATION N/A 04/26/2015  ? Procedure: Left Heart Cath;  Surgeon: Dionisio David, MD;  Location: [place] [facility];  Service: Cardiovascular;  Laterality: N/A;  ? CLAVICLE SURGERY Right 05/04/2021  ? CORONARY ANGIOPLASTY    ? CORONARY STENT INTERVENTION N/A 03/27/2018  ? Procedure: CORONARY STENT INTERVENTION;  Surgeon: Yolonda Kida, MD;  Location: [place] [facility];  Service: Cardiovascular;  Laterality: N/A;  ? EYE SURGERY Bilateral   ? cataract  ? LEFT HEART CATH AND CORONARY ANGIOGRAPHY Left 03/27/2018  ? Procedure: Left Heart Cath with possible coronary intervention;  Surgeon: Dionisio David, MD;  Location: [place] [facility];  Service: Cardiovascular;  Laterality: Left;  ? TOTAL KNEE ARTHROPLASTY Right 10/08/2018  ? Procedure: TOTAL KNEE ARTHROPLASTY;  Surgeon: Lovell [name], MD;  Location: [facility];  Service: Orthopedics;  Laterality: Right;  ? ? ?Home Medications: ?Prior to Admission medications   ?Medication Sig Start Date End Date Taking? Authorizing Provider  ?acetaminophen (TYLENOL) 500 MG tablet Take by mouth.   Yes [provider]  ?amLODipine (NORVASC) 5 MG tablet TAKE ONE TABLET (5 MG) BY MOUTH EVERY DAY 08/31/21  Yes Johnson, [name], DO  ?aspirin 81 MG EC tablet Take 1 tablet (81 mg total) by mouth daily. 08/31/21  Yes Johnson, [name], DO  ?buPROPion [facility]  XL) 150 MG 24 hr tablet TAKE 1 TABLET BY MOUTH DAILY WITH BREAKFAST 01/08/22  Yes Eappen, Ria Clock, MD  ?chlorthalidone (HYGROTON) 25 MG tablet Take 25 mg by mouth daily. 05/26/20  Yes [provider]  ?Cholecalciferol 125 MCG (5000 UT) capsule Take 5,000 Units by mouth daily.   Yes [provider]  ?clopidogrel (PLAVIX) 75 MG tablet Take 75 mg by mouth daily.   Yes [provider]  ?clotrimazole-betamethasone (LOTRISONE) cream Apply 1 application topically 2 (two) times daily. 02/12/20  Yes Johnson, [name], DO  ?diclofenac Sodium (VOLTAREN) 1 % GEL  Voltaren 1 % topical gel ? APPLY 2 GRAMS TO THE AFFECTED AREA(S) BY TOPICAL ROUTE 4 TIMES PER DAY   Yes [provider]  ?DULoxetine (CYMBALTA) 20 MG capsule Take 1 capsule (20 mg total) by mouth daily. Take along with 30 mg daily 06/29/21  Yes Eappen, Ria Clock, MD  ?DULoxetine (CYMBALTA) 30 MG capsule Take 1 capsule (30 mg total) by mouth daily. Take along with 20 mg daily 06/29/21  Yes Eappen, Ria Clock, MD  ?dutasteride (AVODART) 0.5 MG capsule Take 1 capsule (0.5 mg total) by mouth every evening. 08/31/21  Yes Johnson, [name], DO  ?fluticasone (FLONASE) 50 MCG/ACT nasal spray PLACE 2 SPRAYS INTO BOTH NOSTRILS DAILY 12/05/21  Yes Johnson, [name], DO  ?hydrALAZINE (APRESOLINE) 100 MG tablet Take 50 mg by mouth daily.   Yes [provider]  ?hydrocortisone (ANUSOL-HC) 2.5 % rectal cream APPLY RECTALLY TWICE DAILY AS DIRECTED 11/11/20  Yes Johnson, [name], DO  ?hydrOXYzine (ATARAX) 25 MG tablet Take 25 mg by mouth every 8 (eight) hours as needed.   Yes [provider]  ?losartan (COZAAR) 100 MG tablet TAKE ONE TABLET (100 MG) BY MOUTH EVERY DAY 08/31/21  Yes Johnson, [name], DO  ?meloxicam (MOBIC) 15 MG tablet TAKE 1 TABLET BY MOUTH DAILY 11/16/21  Yes Johnson, [name], DO  ?metoprolol tartrate (LOPRESSOR) 25 MG tablet Take 25 mg by mouth daily.   Yes [provider]  ?mupirocin cream (BACTROBAN) 2 % Apply 1 application topically 2 (two) times daily. 06/06/21  Yes Johnson, [name], DO  ?niacin (NIASPAN) 1000 MG CR tablet TAKE 2 TABLETS BY MOUTH AT BEDTIME 07/03/21  Yes Johnson, [name], DO  ?Omega-3 1000 MG CAPS Take by mouth.   Yes [provider]  ?pantoprazole (PROTONIX) 20 MG tablet Take 20 mg by mouth daily.  11/07/15  Yes [provider]  ?rosuvastatin (CRESTOR) 20 MG tablet Take 1 tablet (20 mg total) by mouth every evening. ?Patient taking differently: Take 40 mg by mouth every evening. 08/31/21  Yes Johnson, [name], DO  ?tamsulosin (FLOMAX) 0.4 MG CAPS  capsule Take 1 capsule (0.4 mg total) by mouth daily. 08/31/21  Yes Johnson, [name], DO  ?testosterone cypionate (DEPOTESTOSTERONE CYPIONATE) 200 MG/ML injection INJECT 0.75 ML EVERY 10 DAYS AS DIRECTED 10/15/21  Yes McGowan, Larene Beach A, PA-C  ?VASCEPA 1 g capsule  12/16/19  Yes [provider]  ?vitamin B-12 (CYANOCOBALAMIN) 1000 MCG tablet Take 1,000 mcg by mouth daily.   Yes [provider]  ? ? ?Allergies: ?Allergies  ?Allergen Reactions  ? Clonidine Derivatives Shortness Of Breath and Other (See Comments)  ?  Low heart rate  ? Clonidine   ?  Mild bradycardia + Somnolence  ? ? ?Social History: ? reports that he quit smoking about 15 years ago. His smoking use included cigars. He has never used smokeless tobacco. He reports that he does  not drink alcohol and does not use drugs.  ? ?Family History: ?Family History  ?Problem Relation Age of Onset  ? Diabetes Mother   ? Hypertension Mother   ? Cancer Father   ? Depression Father   ? Anxiety disorder Father   ? Depression Daughter   ? ? ?Review of Systems: ?Review of Systems  ?Constitutional:  Negative for chills and fever.  ?Respiratory:  Negative for shortness of breath.   ?Cardiovascular:  Negative for chest pain.  ?Gastrointestinal:  Negative for abdominal pain, nausea and vomiting.  ?Skin:   ?     Significant bruising and swelling in the upper right buttocks  ? ?Physical Exam ?BP (!) 153/86   Pulse 81   Temp 98 ?F (36.7 ?C)   Ht '6\' 1"'$  (1.854 m)   Wt 221 lb (100.2 kg)   SpO2 97%   BMI 29.16 kg/m?  ?CONSTITUTIONAL: No acute distress, well-nourished ?HEENT:  Normocephalic, atraumatic, extraocular motion intact.  ?RESPIRATORY:  Normal respiratory effort without pathologic use of accessory muscles. ?CARDIOVASCULAR: Regular rhythm and rate. ?MUSCULOSKELETAL:  Normal muscle strength and tone in all four extremities.  No peripheral edema or cyanosis. ?SKIN: The patient has a pretty extensive hematoma involving the entire right buttocks with  extension superiorly towards the lower back, inferiorly towards the bottom edge of his buttocks, to the right extending laterally towards the hip into the left expanding to the medial upper left buttocks.  In the upper right bu

## 2022-01-22 NOTE — Patient Instructions (Addendum)
You may apply Ice to area. Avoid heat.  ? ? ?If you have any concerns or questions, please feel free to call our office. Follow up as needed.  ? ?Hematoma ?A hematoma is a collection of blood. A hematoma can happen: ?Under the skin. ?In an organ. ?In a body space. ?In a joint space. ?In other tissues. ?The blood can thicken (clot) to form a lump that you can see and feel. The lump is often hard and may become sore and tender. The lump can be very small or very big. Most hematomas get better in a few days to weeks. However, some hematomas may be serious and need medical care. ?What are the causes? ?This condition is caused by: ?An injury. ?Blood that leaks under the skin. ?Problems from surgeries. ?Medical conditions that cause bleeding or bruising. ?What increases the risk? ?You are more likely to develop this condition if: ?You are an older adult. ?You use medicines that thin your blood. ?What are the signs or symptoms? ?Symptoms depend on where the hematoma is in your body. ?If the hematoma is under the skin, there is: ?A firm lump on the body. ?Pain and tenderness in the area. ?Bruising. The skin above the lump may be blue, dark blue, purple-red, or yellowish. ?If the hematoma is deep in the tissues or body spaces, there may be: ?Blood in the stomach. This may cause pain in the belly (abdomen), weakness, passing out (fainting), and shortness of breath. ?Blood in the head. This may cause a headache, weakness, trouble speaking or understanding speech, or passing out. ?How is this diagnosed? ?This condition is diagnosed based on: ?Your medical history. ?A physical exam. ?Imaging tests, such as ultrasound or CT scan. ?Blood tests. ?How is this treated? ?Treatment depends on the cause, size, and location of the hematoma. Treatment may include: ?Doing nothing. Many hematomas go away on their own without treatment. ?Surgery or close monitoring. This may be needed for large hematomas or hematomas that affect the body's  organs. ?Medicines. These may be given if a medical condition caused the hematoma. ?Follow these instructions at home: ?Managing pain, stiffness, and swelling ? ?If told, put ice on the area. ?Put ice in a plastic bag. ?Place a towel between your skin and the bag. ?Leave the ice on for 20 minutes, 2-3 times a day for the first two days. ?If told, put heat on the affected area after putting ice on the area for two days. Use the heat source that your doctor tells you to use. This could be a moist heat pack or a heating pad. To do this: ?Place a towel between your skin and the heat source. ?Leave the heat on for 20-30 minutes. ?Remove the heat if your skin turns bright red. This is very important if you are unable to feel pain, heat, or cold. You may have a greater risk of getting burned. ?Raise (elevate) the affected area above the level of your heart while you are sitting or lying down. ?Wrap the affected area with an elastic bandage, if told by your doctor. Do not wrap the bandage too tightly. ?If your hematoma is on a leg or foot and is painful, your doctor may give you crutches. Use them as told by your doctor. ?General instructions ?Take over-the-counter and prescription medicines only as told by your doctor. ?Keep all follow-up visits as told by your doctor. This is important. ?Contact a doctor if: ?You have a fever. ?The swelling or bruising gets worse. ?You start  to get more hematomas. ?Get help right away if: ?Your pain gets worse. ?Your pain is not getting better with medicine. ?Your skin over the hematoma breaks or starts to bleed. ?Your hematoma is in your chest or belly and you: ?Pass out. ?Feel weak. ?Become short of breath. ?You have a hematoma on your scalp that is caused by a fall or injury, and you: ?Have a headache that gets worse. ?Have trouble speaking or understanding speech. ?Become less alert or you pass out. ?Summary ?A hematoma is a collection of blood in any part of your body. ?Most  hematomas get better on their own in a few days to weeks. Some may need medical care. ?Follow instructions from your doctor about how to care for your hematoma. ?Contact a doctor if the swelling or bruising gets worse, or if you are short of breath. ?This information is not intended to replace advice given to you by your health care provider. Make sure you discuss any questions you have with your health care provider. ?Document Revised: 06/29/2021 Document Reviewed: 06/29/2021 ?Elsevier Patient Education ? [place]. ? ?

## 2022-01-24 ENCOUNTER — Other Ambulatory Visit: Payer: Self-pay | Admitting: Psychiatry

## 2022-01-24 ENCOUNTER — Telehealth: Payer: Self-pay | Admitting: Psychiatry

## 2022-01-24 DIAGNOSIS — F3342 Major depressive disorder, recurrent, in full remission: Secondary | ICD-10-CM

## 2022-01-24 DIAGNOSIS — F411 Generalized anxiety disorder: Secondary | ICD-10-CM

## 2022-01-24 DIAGNOSIS — G4701 Insomnia due to medical condition: Secondary | ICD-10-CM

## 2022-01-24 MED ORDER — DULOXETINE HCL 30 MG PO CPEP: 30.0000 mg | ORAL_CAPSULE | Freq: Every day | ORAL | 1 refills | Status: DC

## 2022-01-24 NOTE — Telephone Encounter (Signed)
I have sent duloxetine 50 mg to pharmacy. ?

## 2022-01-29 ENCOUNTER — Ambulatory Visit
Admission: RE | Admit: 2022-01-29 | Discharge: 2022-01-29 | Disposition: A | Discharge status: Home / Self Care | Payer: Medicare PPO | Source: Ambulatory Visit | Attending: Neurosurgery | Admitting: Neurosurgery

## 2022-01-29 DIAGNOSIS — G959 Disease of spinal cord, unspecified: Secondary | ICD-10-CM | POA: Diagnosis not present

## 2022-01-29 DIAGNOSIS — M542 Cervicalgia: Secondary | ICD-10-CM | POA: Diagnosis not present

## 2022-02-03 DIAGNOSIS — M5416 Radiculopathy, lumbar region: Secondary | ICD-10-CM | POA: Diagnosis not present

## 2022-02-06 ENCOUNTER — Ambulatory Visit (INDEPENDENT_AMBULATORY_CARE_PROVIDER_SITE_OTHER): Payer: Medicare PPO | Admitting: Psychiatry

## 2022-02-06 ENCOUNTER — Encounter: Payer: Self-pay | Admitting: Psychiatry

## 2022-02-06 VITALS — BP 142/67 | HR 79 | Temp 98.3°F | Wt 220.6 lb

## 2022-02-06 DIAGNOSIS — F331 Major depressive disorder, recurrent, moderate: Secondary | ICD-10-CM | POA: Diagnosis not present

## 2022-02-06 DIAGNOSIS — F411 Generalized anxiety disorder: Secondary | ICD-10-CM | POA: Diagnosis not present

## 2022-02-06 DIAGNOSIS — G4701 Insomnia due to medical condition: Secondary | ICD-10-CM | POA: Diagnosis not present

## 2022-02-06 MED ORDER — DULOXETINE HCL 60 MG PO CPEP: 60.0000 mg | ORAL_CAPSULE | Freq: Every day | ORAL | 0 refills | Status: DC

## 2022-02-06 MED ORDER — DULOXETINE HCL 20 MG PO CPEP: ORAL_CAPSULE | ORAL | 0 refills | Status: DC

## 2022-02-06 NOTE — Progress Notes (Signed)
[facility] MD OP Progress Note  02/06/2022 1:59 PM PRYNCE Pruitt  MRN:  [id number]  Chief Complaint:  Chief Complaint  Patient presents with   Follow-up: 71 year old Caucasian male with history of MDD, GAD, insomnia, chronic pain, presented for medication management.   HPI: Kenneth Pruitt is a 71 year old Caucasian male, retired, married, lives in [place], has a history of MDD, GAD, insomnia, coronary artery disease, stent placement, knee pain, testosterone deficiency was evaluated in office today.  Patient today reports he has had multiple falls since his last visit.  Patient does report nerve damage of his lower extremities as well as back as well as history of multiple surgeries.  Patient did have an MRI of his spine recently, currently awaiting a follow-up appointment with Dr. Cari Caraway.  Patient reports he does struggle with some depressive symptoms like sadness, low motivation, low energy, since the past several weeks.  Patient reports he is currently retired and struggles with not having a structured in his life right now.  That does affect his motivation to do things.  Patient reports sleep is overall okay.  Patient is compliant on Cymbalta, agreeable to dosage increase.  Denies side effects.  Continues to be compliant on Wellbutrin.  Denies side effects.  Patient denies suicidality, homicidality or perceptual disturbances.  Patient denies any other concerns today.  Visit Diagnosis:    ICD-10-CM   1. MDD (major depressive disorder), recurrent episode, moderate (HCC)  F33.1 DULoxetine (CYMBALTA) 60 MG capsule    DULoxetine (CYMBALTA) 20 MG capsule    2. GAD (generalized anxiety disorder)  F41.1 DULoxetine (CYMBALTA) 60 MG capsule    DULoxetine (CYMBALTA) 20 MG capsule    3. Insomnia due to medical condition  G47.01 DULoxetine (CYMBALTA) 20 MG capsule   depression, pain      Past Psychiatric History: Reviewed past psychiatric history from progress note on 12/16/2017.  Past  Medical History:  Past Medical History:  Diagnosis Date   Arthritis    Coronary artery disease    Depression    Hyperlipidemia    Skin cancer    left forearm treated years ago by Dr. Golden Pop    Past Surgical History:  Procedure Laterality Date   ANKLE FRACTURE SURGERY Right    ARTHRODESIS ANT INTERBODY INC DISCECTOMY, CERVICAL BELOW C2    05/2019   CARDIAC CATHETERIZATION N/A 04/26/2015   Procedure: Left Heart Cath;  Surgeon: Dionisio David, MD;  Location: [facility];  Service: Cardiovascular;  Laterality: N/A;   CLAVICLE SURGERY Right 05/04/2021   CORONARY ANGIOPLASTY     CORONARY STENT INTERVENTION N/A 03/27/2018   Procedure: CORONARY STENT INTERVENTION;  Surgeon: Yolonda Kida, MD;  Location: [facility];  Service: Cardiovascular;  Laterality: N/A;   EYE SURGERY Bilateral    cataract   LEFT HEART CATH AND CORONARY ANGIOGRAPHY Left 03/27/2018   Procedure: Left Heart Cath with possible coronary intervention;  Surgeon: Dionisio David, MD;  Location: [facility];  Service: Cardiovascular;  Laterality: Left;   TOTAL KNEE ARTHROPLASTY Right 10/08/2018   Procedure: TOTAL KNEE ARTHROPLASTY;  Surgeon: Lovell [name], MD;  Location: [facility];  Service: Orthopedics;  Laterality: Right;    Family Psychiatric History: Reviewed family psychiatric history from progress note on 12/16/2017.  Family History:  Family History  Problem Relation Age of Onset   Diabetes Mother    Hypertension Mother    Cancer Father    Depression Father    Anxiety disorder Father  Depression Daughter     Social History: Reviewed social history from progress note on 12/16/2017. Social History   Socioeconomic History   Marital status: Married    Spouse name: ann   Number of children: 2   Years of education: Not on file   Highest education level: Master's degree (e.g., MA, MS, MEng, MEd, MSW, MBA)  Occupational History    Comment: retired  Tobacco Use   Smoking  status: Former    Types: Cigars    Quit date: 07/18/2006    Years since quitting: 15.5   Smokeless tobacco: Never  Vaping Use   Vaping Use: Never used  Substance and Sexual Activity   Alcohol use: No   Drug use: No   Sexual activity: Not Currently  Other Topics Concern   Not on file  Social History Narrative   Not on file   Social Determinants of Health   Financial Resource Strain: Low Risk    Difficulty of Paying Living Expenses: Not hard at all  Food Insecurity: No Food Insecurity   Worried About Charity fundraiser in the Last Year: Never true   Ran Out of Food in the Last Year: Never true  Transportation Needs: No Transportation Needs   Lack of Transportation (Medical): No   Lack of Transportation (Non-Medical): No  Physical Activity: Inactive   Days of Exercise per Week: 0 days   Minutes of Exercise per Session: 0 min  Stress: No Stress Concern Present   Feeling of Stress : Not at all  Social Connections: Moderately Integrated   Frequency of Communication with Friends and Family: More than three times a week   Frequency of Social Gatherings with Friends and Family: Once a week   Attends Religious Services: More than 4 times per year   Active Member of [employer] or Organizations: No   Attends Archivist Meetings: Never   Marital Status: Married    Allergies:  Allergies  Allergen Reactions   Clonidine Derivatives Shortness Of Breath and Other (See Comments)    Low heart rate   Clonidine     Mild bradycardia + Somnolence    Metabolic Disorder Labs: Lab Results  Component Value Date   HGBA1C 5.5 08/15/2020   No results found for: PROLACTIN Lab Results  Component Value Date   CHOL 96 (L) 08/31/2021   TRIG 128 08/31/2021   HDL 40 08/31/2021   CHOLHDL 2.5 08/20/2019   VLDL 37 03/28/2018   LDLCALC 33 08/31/2021   LDLCALC 24 02/21/2021   Lab Results  Component Value Date   TSH 0.939 08/31/2021   TSH 1.020 02/21/2021    Therapeutic Level  Labs: No results found for: LITHIUM No results found for: VALPROATE No components found for:  CBMZ  Current Medications: Current Outpatient Medications  Medication Sig Dispense Refill   acetaminophen (TYLENOL) 500 MG tablet Take by mouth.     amLODipine (NORVASC) 5 MG tablet TAKE ONE TABLET (5 MG) BY MOUTH EVERY DAY 90 tablet 1   aspirin 81 MG EC tablet Take 1 tablet (81 mg total) by mouth daily. 90 tablet 3   buPROPion (WELLBUTRIN XL) 150 MG 24 hr tablet TAKE 1 TABLET BY MOUTH DAILY WITH BREAKFAST 90 tablet 0   chlorthalidone (HYGROTON) 25 MG tablet Take 25 mg by mouth daily.     Cholecalciferol 125 MCG (5000 UT) capsule Take 5,000 Units by mouth daily.     clopidogrel (PLAVIX) 75 MG tablet Take 75 mg by mouth daily.  clotrimazole-betamethasone (LOTRISONE) cream Apply 1 application topically 2 (two) times daily. 30 g 0   diclofenac Sodium (VOLTAREN) 1 % GEL Voltaren 1 % topical gel  APPLY 2 GRAMS TO THE AFFECTED AREA(S) BY TOPICAL ROUTE 4 TIMES PER DAY     DULoxetine (CYMBALTA) 60 MG capsule Take 1 capsule (60 mg total) by mouth daily. Take along with 20 mg - total of 80 mg 90 capsule 0   dutasteride (AVODART) 0.5 MG capsule Take 1 capsule (0.5 mg total) by mouth every evening. 90 capsule 1   fluticasone (FLONASE) 50 MCG/ACT nasal spray PLACE 2 SPRAYS INTO BOTH NOSTRILS DAILY 16 g 12   hydrALAZINE (APRESOLINE) 100 MG tablet Take 50 mg by mouth daily.     hydrocortisone (ANUSOL-HC) 2.5 % rectal cream APPLY RECTALLY TWICE DAILY AS DIRECTED 30 g 0   hydrOXYzine (ATARAX) 25 MG tablet Take 25 mg by mouth every 8 (eight) hours as needed.     losartan (COZAAR) 100 MG tablet TAKE ONE TABLET (100 MG) BY MOUTH EVERY DAY 90 tablet 1   meloxicam (MOBIC) 15 MG tablet TAKE 1 TABLET BY MOUTH DAILY 90 tablet 1   metoprolol tartrate (LOPRESSOR) 25 MG tablet Take 25 mg by mouth daily.     mupirocin cream (BACTROBAN) 2 % Apply 1 application topically 2 (two) times daily. 30 g 2   niacin (NIASPAN) 1000  MG CR tablet TAKE 2 TABLETS BY MOUTH AT BEDTIME 180 tablet 2   pantoprazole (PROTONIX) 20 MG tablet Take 20 mg by mouth daily.      rosuvastatin (CRESTOR) 20 MG tablet Take 1 tablet (20 mg total) by mouth every evening. (Patient taking differently: Take 40 mg by mouth every evening.) 90 tablet 1   tamsulosin (FLOMAX) 0.4 MG CAPS capsule Take 1 capsule (0.4 mg total) by mouth daily. 90 capsule 1   testosterone cypionate (DEPOTESTOSTERONE CYPIONATE) 200 MG/ML injection INJECT 0.75 ML EVERY 10 DAYS AS DIRECTED 10 mL 0   VASCEPA 1 g capsule      vitamin B-12 (CYANOCOBALAMIN) 1000 MCG tablet Take 1,000 mcg by mouth daily.     DULoxetine (CYMBALTA) 20 MG capsule Take 20 mg daily along with 60 mg - total of 80 mg . 90 capsule 0   No current facility-administered medications for this visit.     Musculoskeletal: Strength & Muscle Tone: within normal limits Gait & Station: normal Patient leans: N/A  Psychiatric Specialty Exam: Review of Systems  Musculoskeletal:  Positive for back pain (chronic).  Psychiatric/Behavioral:  Positive for dysphoric mood.   All other systems reviewed and are negative.  Blood pressure (!) 142/67, pulse 79, temperature 98.3 F (36.8 C), temperature source Temporal, weight 220 lb 9.6 oz (100.1 kg).Body mass index is 29.1 kg/m.  General Appearance: Casual  Eye Contact:  Fair  Speech:  Clear and Coherent  Volume:  Normal  Mood:  Depressed  Affect:  Congruent  Thought Process:  Goal Directed and Descriptions of Associations: Intact  Orientation:  Full (Time, Place, and Person)  Thought Content: Logical   Suicidal Thoughts:  No  Homicidal Thoughts:  No  Memory:  Immediate;   Fair Recent;   Fair Remote;   Fair  Judgement:  Fair  Insight:  Fair  Psychomotor Activity:  Normal  Concentration:  Concentration: Fair and Attention Span: Fair  Recall:  [employer] of Knowledge: Fair  Language: Fair  Akathisia:  No  Handed:  Right  AIMS (if indicated): done  Assets:   Communication  Skills Desire for Improvement Housing Intimacy Social Support  ADL's:  Intact  Cognition: WNL  Sleep:  Fair   Screenings: [facility] Office Visit from 02/06/2022 in [facility] Total Score 0      [facility] Office Visit from 02/06/2022 in [facility] Office Visit from 08/31/2021 in [facility] Video Visit from 06/29/2021 in [facility] Video Visit from 04/17/2021 in [facility] Office Visit from 02/21/2021 in [facility]  Total GAD-7 Score 1 1 0 6 5      PHQ2-9    [facility] [place] Visit from 02/06/2022 in [facility] Office Visit from 12/12/2021 in [facility] Office Visit from 11/14/2021 in [facility] Office Visit from 10/12/2021 in [facility] Office Visit from 08/31/2021 in [facility]  PHQ-2 Total Score 3 0 2 2 0  PHQ-9 Total Score 12 -- '7 5 1      '$ Flowsheet Row Office Visit from 02/06/2022 in [facility] Office Visit from 10/12/2021 in [facility] from 04/22/2021 in [facility] No Risk No Risk No Risk        Assessment and Plan: SERGE MAIN is a 71 year old Caucasian male who has a history of MDD, anxiety disorder, multiple medical problems including recent orthopedic surgery for fracture of the clavicle, hypertension, coronary artery disease, status post stent placement, hyperlipidemia, BPH, chronic pain was evaluated in office today.  Patient is currently struggling with depressive symptoms will benefit from dosage readjustment of his medications.  Plan as noted below.  Plan MDD-unstable Increase Cymbalta to 80 mg p.o.  daily Wellbutrin XL 150 mg p.o. daily  GAD-Stable Cymbalta to 80 mg p.o. daily Continue practicing mindfulness.  Insomnia-stable Continue pain management.  Follow-up in clinic in 2 months or sooner if needed.  This note was generated in part or whole with voice recognition software. Voice recognition is usually quite accurate but there are transcription errors that can and very often do occur. I apologize for any typographical errors that were not detected and corrected.      Ursula Alert, MD 02/06/2022, 2:00 PM

## 2022-02-15 ENCOUNTER — Other Ambulatory Visit: Payer: Self-pay | Admitting: Urology

## 2022-02-15 ENCOUNTER — Telehealth: Payer: Self-pay | Admitting: Urology

## 2022-02-15 DIAGNOSIS — E291 Testicular hypofunction: Secondary | ICD-10-CM

## 2022-02-15 NOTE — Telephone Encounter (Signed)
Patient notified and voiced understanding. He will call Dr. Gary Fleet office and move his next appointment sooner.

## 2022-02-15 NOTE — Telephone Encounter (Signed)
We went ahead and refilled the testosterone injections, but his HCT is creeping up again, so Dr. Bernardo Heater recommends he see Dr. Grayland Ormond again soon.

## 2022-02-21 ENCOUNTER — Other Ambulatory Visit: Payer: Self-pay | Admitting: Neurosurgery

## 2022-02-21 DIAGNOSIS — M4714 Other spondylosis with myelopathy, thoracic region: Secondary | ICD-10-CM

## 2022-02-27 ENCOUNTER — Other Ambulatory Visit: Payer: Self-pay

## 2022-02-27 ENCOUNTER — Inpatient Hospital Stay: Payer: Medicare PPO | Attending: Oncology

## 2022-02-27 ENCOUNTER — Inpatient Hospital Stay (HOSPITAL_BASED_OUTPATIENT_CLINIC_OR_DEPARTMENT_OTHER): Payer: Medicare PPO | Admitting: Nurse Practitioner

## 2022-02-27 ENCOUNTER — Inpatient Hospital Stay: Payer: Medicare PPO

## 2022-02-27 VITALS — BP 128/80 | HR 78 | Temp 97.0°F | Wt 219.3 lb

## 2022-02-27 DIAGNOSIS — F17290 Nicotine dependence, other tobacco product, uncomplicated: Secondary | ICD-10-CM | POA: Diagnosis not present

## 2022-02-27 DIAGNOSIS — Z79899 Other long term (current) drug therapy: Secondary | ICD-10-CM | POA: Insufficient documentation

## 2022-02-27 DIAGNOSIS — Z7982 Long term (current) use of aspirin: Secondary | ICD-10-CM | POA: Insufficient documentation

## 2022-02-27 DIAGNOSIS — D751 Secondary polycythemia: Secondary | ICD-10-CM | POA: Insufficient documentation

## 2022-02-27 LAB — CBC WITH DIFFERENTIAL/PLATELET
Abs Immature Granulocytes: 0.01 10*3/uL (ref 0.00–0.07)
Basophils Absolute: 0 10*3/uL (ref 0.0–0.1)
Basophils Relative: 1 %
Eosinophils Absolute: 0.1 10*3/uL (ref 0.0–0.5)
Eosinophils Relative: 2 %
HCT: 46.3 % (ref 39.0–52.0)
Hemoglobin: 15.6 g/dL (ref 13.0–17.0)
Immature Granulocytes: 0 %
Lymphocytes Relative: 23 %
Lymphs Abs: 1.2 10*3/uL (ref 0.7–4.0)
MCH: 30.8 pg (ref 26.0–34.0)
MCHC: 33.7 g/dL (ref 30.0–36.0)
MCV: 91.3 fL (ref 80.0–100.0)
Monocytes Absolute: 0.6 10*3/uL (ref 0.1–1.0)
Monocytes Relative: 12 %
Neutro Abs: 3.2 10*3/uL (ref 1.7–7.7)
Neutrophils Relative %: 62 %
Platelets: 198 10*3/uL (ref 150–400)
RBC: 5.07 MIL/uL (ref 4.22–5.81)
RDW: 14 % (ref 11.5–15.5)
WBC: 5.1 10*3/uL (ref 4.0–10.5)
nRBC: 0 % (ref 0.0–0.2)
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
## 2022-02-27 NOTE — Progress Notes (Signed)
HCT 46.3. No phlebotomy required today.

## 2022-03-01 ENCOUNTER — Ambulatory Visit: Payer: Medicare PPO | Admitting: Family Medicine

## 2022-03-01 ENCOUNTER — Encounter: Payer: Self-pay | Admitting: Family Medicine

## 2022-03-01 VITALS — BP 106/57 | HR 93 | Temp 98.3°F | Wt 214.0 lb

## 2022-03-01 DIAGNOSIS — I10 Essential (primary) hypertension: Secondary | ICD-10-CM

## 2022-03-01 DIAGNOSIS — E291 Testicular hypofunction: Secondary | ICD-10-CM | POA: Diagnosis not present

## 2022-03-01 DIAGNOSIS — N401 Enlarged prostate with lower urinary tract symptoms: Secondary | ICD-10-CM

## 2022-03-01 DIAGNOSIS — M48061 Spinal stenosis, lumbar region without neurogenic claudication: Secondary | ICD-10-CM | POA: Diagnosis not present

## 2022-03-01 DIAGNOSIS — R350 Frequency of micturition: Secondary | ICD-10-CM | POA: Diagnosis not present

## 2022-03-01 DIAGNOSIS — E782 Mixed hyperlipidemia: Secondary | ICD-10-CM

## 2022-03-01 DIAGNOSIS — N183 Chronic kidney disease, stage 3 unspecified: Secondary | ICD-10-CM

## 2022-03-01 DIAGNOSIS — R972 Elevated prostate specific antigen [PSA]: Secondary | ICD-10-CM | POA: Diagnosis not present

## 2022-03-01 MED ORDER — NIACIN ER (ANTIHYPERLIPIDEMIC) 1000 MG PO TBCR: 2000.0000 mg | EXTENDED_RELEASE_TABLET | Freq: Every day | ORAL | 2 refills | Status: DC

## 2022-03-01 MED ORDER — AMLODIPINE BESYLATE 2.5 MG PO TABS: 2.5000 mg | ORAL_TABLET | Freq: Every day | ORAL | 1 refills | Status: DC

## 2022-03-01 MED ORDER — TAMSULOSIN HCL 0.4 MG PO CAPS: 0.4000 mg | ORAL_CAPSULE | Freq: Every day | ORAL | 1 refills | Status: DC

## 2022-03-01 MED ORDER — DUTASTERIDE 0.5 MG PO CAPS: 0.5000 mg | ORAL_CAPSULE | Freq: Every evening | ORAL | 1 refills | Status: DC

## 2022-03-01 MED ORDER — ROSUVASTATIN CALCIUM 40 MG PO TABS: 40.0000 mg | ORAL_TABLET | Freq: Every evening | ORAL | 1 refills | Status: DC

## 2022-03-01 MED ORDER — MELOXICAM 15 MG PO TABS: 15.0000 mg | ORAL_TABLET | Freq: Every day | ORAL | 1 refills | Status: DC

## 2022-03-01 MED ORDER — LOSARTAN POTASSIUM 100 MG PO TABS: ORAL_TABLET | ORAL | 1 refills | Status: DC

## 2022-03-01 NOTE — Assessment & Plan Note (Signed)
BP running low, will cut his amlodipine to 2.'5mg'$  and recheck 2 months. Call with any concerns.

## 2022-03-01 NOTE — Progress Notes (Signed)
BP (!) 106/57   Pulse 93   Temp 98.3 F (36.8 C)   Wt 214 lb (97.1 kg)   SpO2 96%   BMI 28.23 kg/m    Subjective:    Patient ID: Kenneth Pruitt, male    DOB: 1951-02-07, 71 y.o.   MRN: [id number]  HPI: Kenneth Pruitt is a 71 y.o. male  Chief Complaint  Patient presents with   Hypertension   Hyperlipidemia   Chronic Kidney Disease   HYPERTENSION / [place] Satisfied with current treatment? yes Duration of hypertension: chronic BP monitoring frequency: a few times a month BP medication side effects: no Duration of hyperlipidemia: chronic Cholesterol medication side effects: no Cholesterol supplements: niacin Past cholesterol medications: crestor Medication compliance: excellent compliance Aspirin: yes Recent stressors: no Recurrent headaches: no Visual changes: no Palpitations: no Dyspnea: no Chest pain: no Lower extremity edema: no Dizzy/lightheaded: no  BPH BPH status: controlled Satisfied with current treatment?: yes Medication side effects: no Medication compliance: excellent compliance Duration: chronic Nocturia: 1/night Urinary frequency:no Incomplete voiding: no Urgency: no Weak urinary stream: no Straining to start stream: no Dysuria: no Onset: gradual Severity: mild  Relevant past medical, surgical, family and social history reviewed and updated as indicated. Interim medical history since our last visit reviewed. Allergies and medications reviewed and updated.  Review of Systems  Constitutional: Negative.   Respiratory: Negative.    Cardiovascular: Negative.   Gastrointestinal: Negative.   Musculoskeletal: Negative.   Neurological: Negative.   Psychiatric/Behavioral: Negative.      Per HPI unless specifically indicated above     Objective:    BP (!) 106/57   Pulse 93   Temp 98.3 F (36.8 C)   Wt 214 lb (97.1 kg)   SpO2 96%   BMI 28.23 kg/m   Wt Readings from Last 3 Encounters:  03/01/22 214 lb (97.1 kg)  02/27/22  219 lb 4.8 oz (99.5 kg)  01/22/22 221 lb (100.2 kg)    Physical Exam Vitals and nursing note reviewed.  Constitutional:      General: He is not in acute distress.    Appearance: Normal appearance. He is not ill-appearing, toxic-appearing or diaphoretic.  HENT:     Head: Normocephalic and atraumatic.     Right Ear: External ear normal.     Left Ear: External ear normal.     Nose: Nose normal.     Mouth/Throat:     Mouth: Mucous membranes are moist.     Pharynx: Oropharynx is clear.  Eyes:     General: No scleral icterus.       Right eye: No discharge.        Left eye: No discharge.     Extraocular Movements: Extraocular movements intact.     Conjunctiva/sclera: Conjunctivae normal.     Pupils: Pupils are equal, round, and reactive to light.  Cardiovascular:     Rate and Rhythm: Normal rate and regular rhythm.     Pulses: Normal pulses.     Heart sounds: Normal heart sounds. No murmur heard.    No friction rub. No gallop.  Pulmonary:     Effort: Pulmonary effort is normal. No respiratory distress.     Breath sounds: Normal breath sounds. No stridor. No wheezing, rhonchi or rales.  Chest:     Chest wall: No tenderness.  Musculoskeletal:        General: Normal range of motion.     Cervical back: Normal range of motion and neck supple.  Skin:  General: Skin is warm and dry.     Capillary Refill: Capillary refill takes less than 2 seconds.     Coloration: Skin is not jaundiced or pale.     Findings: No bruising, erythema, lesion or rash.  Neurological:     General: No focal deficit present.     Mental Status: He is alert and oriented to person, place, and time. Mental status is at baseline.  Psychiatric:        Mood and Affect: Mood normal.        Behavior: Behavior normal.        Thought Content: Thought content normal.        Judgment: Judgment normal.     Results for orders placed or performed in visit on 02/27/22  CBC with Differential  Result Value Ref Range    WBC 5.1 4.0 - 10.5 K/uL   RBC 5.07 4.22 - 5.81 MIL/uL   Hemoglobin 15.6 13.0 - 17.0 g/dL   HCT 46.3 39.0 - 52.0 %   MCV 91.3 80.0 - 100.0 fL   MCH 30.8 26.0 - 34.0 pg   MCHC 33.7 30.0 - 36.0 g/dL   RDW 14.0 11.5 - 15.5 %   Platelets 198 150 - 400 K/uL   nRBC 0.0 0.0 - 0.2 %   Neutrophils Relative % 62 %   Neutro Abs 3.2 1.7 - 7.7 K/uL   Lymphocytes Relative 23 %   Lymphs Abs 1.2 0.7 - 4.0 K/uL   Monocytes Relative 12 %   Monocytes Absolute 0.6 0.1 - 1.0 K/uL   Eosinophils Relative 2 %   Eosinophils Absolute 0.1 0.0 - 0.5 K/uL   Basophils Relative 1 %   Basophils Absolute 0.0 0.0 - 0.1 K/uL   Immature Granulocytes 0 %   Abs Immature Granulocytes 0.01 0.00 - 0.07 K/uL      Assessment & Plan:   Problem List Items Addressed This Visit       Cardiovascular and Mediastinum   Essential hypertension - Primary    BP running low, will cut his amlodipine to 2.'5mg'$  and recheck 2 months. Call with any concerns.       Relevant Medications   amLODipine (NORVASC) 2.5 MG tablet   losartan (COZAAR) 100 MG tablet   niacin (NIASPAN) 1000 MG CR tablet   rosuvastatin (CRESTOR) 40 MG tablet   Other Relevant Orders   Comprehensive metabolic panel   CBC with Differential/Platelet     Endocrine   Hypogonadism in male    Managed by urology. Rechecking labs today- will send copy of them to urology. Call with any concerns.       Relevant Orders   Testosterone, free, total([facility])     Genitourinary   BPH (benign prostatic hyperplasia)    Under good control on current regimen. Continue current regimen. Continue to monitor. Call with any concerns. Refills given.        Relevant Medications   dutasteride (AVODART) 0.5 MG capsule   tamsulosin (FLOMAX) 0.4 MG CAPS capsule   Chronic kidney disease (CKD), stage III (moderate) (HCC)    Rechecking labs today. Await results. Treat as needed.       Relevant Orders   Comprehensive metabolic panel   CBC with Differential/Platelet      Other   Hyperlipidemia    Under good control on current regimen. Continue current regimen. Continue to monitor. Call with any concerns. Refills given. Labs drawn today.        Relevant Medications  amLODipine (NORVASC) 2.5 MG tablet   losartan (COZAAR) 100 MG tablet   niacin (NIASPAN) 1000 MG CR tablet   rosuvastatin (CRESTOR) 40 MG tablet   Other Relevant Orders   Comprehensive metabolic panel   CBC with Differential/Platelet   Lipid Panel w/o Chol/HDL Ratio   Elevated PSA    Rechecking labs today. Await results. Treat as needed.       Relevant Medications   tamsulosin (FLOMAX) 0.4 MG CAPS capsule   Other Relevant Orders   PSA     Follow up plan: Return in about 2 months (around 05/01/2022).

## 2022-03-01 NOTE — Assessment & Plan Note (Signed)
Rechecking labs today. Await results. Treat as needed.  °

## 2022-03-01 NOTE — Assessment & Plan Note (Signed)
Under good control on current regimen. Continue current regimen. Continue to monitor. Call with any concerns. Refills given.   

## 2022-03-01 NOTE — Assessment & Plan Note (Signed)
Managed by urology. Rechecking labs today- will send copy of them to urology. Call with any concerns.

## 2022-03-01 NOTE — Assessment & Plan Note (Signed)
Under good control on current regimen. Continue current regimen. Continue to monitor. Call with any concerns. Refills given. Labs drawn today.   

## 2022-03-02 ENCOUNTER — Other Ambulatory Visit: Payer: Self-pay | Admitting: Family Medicine

## 2022-03-02 DIAGNOSIS — E876 Hypokalemia: Secondary | ICD-10-CM

## 2022-03-06 LAB — CBC WITH DIFFERENTIAL/PLATELET
Basophils Absolute: 0 10*3/uL (ref 0.0–0.2)
Basos: 1 %
EOS (ABSOLUTE): 0.1 10*3/uL (ref 0.0–0.4)
Eos: 3 %
Hematocrit: 49.1 % (ref 37.5–51.0)
Hemoglobin: 16.6 g/dL (ref 13.0–17.7)
Immature Grans (Abs): 0 10*3/uL (ref 0.0–0.1)
Immature Granulocytes: 0 %
Lymphocytes Absolute: 1.1 10*3/uL (ref 0.7–3.1)
Lymphs: 25 %
MCH: 31.3 pg (ref 26.6–33.0)
MCHC: 33.8 g/dL (ref 31.5–35.7)
MCV: 93 fL (ref 79–97)
Monocytes Absolute: 0.3 10*3/uL (ref 0.1–0.9)
Monocytes: 6 %
Neutrophils Absolute: 2.8 10*3/uL (ref 1.4–7.0)
Neutrophils: 65 %
Platelets: 194 10*3/uL (ref 150–450)
RBC: 5.3 x10E6/uL (ref 4.14–5.80)
RDW: 13.9 % (ref 11.6–15.4)
WBC: 4.3 10*3/uL (ref 3.4–10.8)

## 2022-03-06 LAB — LIPID PANEL W/O CHOL/HDL RATIO
Cholesterol, Total: 94 mg/dL — ABNORMAL LOW (ref 100–199)
HDL: 43 mg/dL (ref >39)
LDL Chol Calc (NIH): 29 mg/dL (ref 0–99)
Triglycerides: 126 mg/dL (ref 0–149)
VLDL Cholesterol Cal: 22 mg/dL (ref 5–40)

## 2022-03-06 LAB — COMPREHENSIVE METABOLIC PANEL
ALT: 19 IU/L (ref 0–44)
AST: 19 IU/L (ref 0–40)
Albumin/Globulin Ratio: 2.3 — ABNORMAL HIGH (ref 1.2–2.2)
Albumin: 4.5 g/dL (ref 3.8–4.8)
Alkaline Phosphatase: 55 IU/L (ref 44–121)
BUN/Creatinine Ratio: 21 (ref 10–24)
BUN: 26 mg/dL (ref 8–27)
Bilirubin Total: 0.5 mg/dL (ref 0.0–1.2)
CO2: 22 mmol/L (ref 20–29)
Calcium: 9.4 mg/dL (ref 8.6–10.2)
Chloride: 100 mmol/L (ref 96–106)
Creatinine, Ser: 1.23 mg/dL (ref 0.76–1.27)
Globulin, Total: 2 g/dL (ref 1.5–4.5)
Glucose: 154 mg/dL — ABNORMAL HIGH (ref 70–99)
Potassium: 3.3 mmol/L — ABNORMAL LOW (ref 3.5–5.2)
Sodium: 139 mmol/L (ref 134–144)
Total Protein: 6.5 g/dL (ref 6.0–8.5)
eGFR: 63 mL/min/{1.73_m2} (ref >59)

## 2022-03-06 LAB — TESTOSTERONE, FREE, TOTAL, SHBG
Sex Hormone Binding: 24.1 nmol/L (ref 19.3–76.4)
Testosterone, Free: 16 pg/mL (ref 6.6–18.1)
Testosterone: 998 ng/dL — ABNORMAL HIGH (ref 264–916)

## 2022-03-06 LAB — PSA: Prostate Specific Ag, Serum: 3.1 ng/mL (ref 0.0–4.0)

## 2022-03-12 ENCOUNTER — Ambulatory Visit
Admission: RE | Admit: 2022-03-12 | Discharge: 2022-03-12 | Disposition: A | Discharge status: Home / Self Care | Payer: Medicare PPO | Source: Ambulatory Visit | Attending: Neurosurgery | Admitting: Neurosurgery

## 2022-03-12 DIAGNOSIS — M4313 Spondylolisthesis, cervicothoracic region: Secondary | ICD-10-CM | POA: Diagnosis not present

## 2022-03-12 DIAGNOSIS — R531 Weakness: Secondary | ICD-10-CM | POA: Diagnosis not present

## 2022-03-12 DIAGNOSIS — M5124 Other intervertebral disc displacement, thoracic region: Secondary | ICD-10-CM | POA: Diagnosis not present

## 2022-03-12 DIAGNOSIS — M4714 Other spondylosis with myelopathy, thoracic region: Secondary | ICD-10-CM | POA: Diagnosis not present

## 2022-03-12 DIAGNOSIS — M47814 Spondylosis without myelopathy or radiculopathy, thoracic region: Secondary | ICD-10-CM | POA: Diagnosis not present

## 2022-03-15 DIAGNOSIS — M4807 Spinal stenosis, lumbosacral region: Secondary | ICD-10-CM | POA: Diagnosis not present

## 2022-03-15 DIAGNOSIS — M48062 Spinal stenosis, lumbar region with neurogenic claudication: Secondary | ICD-10-CM | POA: Diagnosis not present

## 2022-03-15 DIAGNOSIS — M4317 Spondylolisthesis, lumbosacral region: Secondary | ICD-10-CM | POA: Diagnosis not present

## 2022-03-15 DIAGNOSIS — M4316 Spondylolisthesis, lumbar region: Secondary | ICD-10-CM | POA: Diagnosis not present

## 2022-03-20 DIAGNOSIS — M4317 Spondylolisthesis, lumbosacral region: Secondary | ICD-10-CM | POA: Diagnosis not present

## 2022-03-21 DIAGNOSIS — R202 Paresthesia of skin: Secondary | ICD-10-CM | POA: Diagnosis not present
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
## 2022-03-27 ENCOUNTER — Ambulatory Visit: Payer: Medicare PPO | Admitting: Oncology

## 2022-03-27 ENCOUNTER — Other Ambulatory Visit: Payer: Medicare PPO

## 2022-04-03 DIAGNOSIS — I340 Nonrheumatic mitral (valve) insufficiency: Secondary | ICD-10-CM | POA: Diagnosis not present

## 2022-04-03 DIAGNOSIS — I10 Essential (primary) hypertension: Secondary | ICD-10-CM | POA: Diagnosis not present

## 2022-04-03 DIAGNOSIS — E785 Hyperlipidemia, unspecified: Secondary | ICD-10-CM | POA: Diagnosis not present

## 2022-04-03 DIAGNOSIS — I2510 Atherosclerotic heart disease of native coronary artery without angina pectoris: Secondary | ICD-10-CM | POA: Diagnosis not present

## 2022-04-03 DIAGNOSIS — I351 Nonrheumatic aortic (valve) insufficiency: Secondary | ICD-10-CM | POA: Diagnosis not present

## 2022-04-05 DIAGNOSIS — M4316 Spondylolisthesis, lumbar region: Secondary | ICD-10-CM | POA: Diagnosis not present

## 2022-04-05 DIAGNOSIS — M48062 Spinal stenosis, lumbar region with neurogenic claudication: Secondary | ICD-10-CM | POA: Diagnosis not present

## 2022-04-05 DIAGNOSIS — M4727 Other spondylosis with radiculopathy, lumbosacral region: Secondary | ICD-10-CM | POA: Diagnosis not present

## 2022-04-09 ENCOUNTER — Encounter: Payer: Self-pay | Admitting: Psychiatry

## 2022-04-09 ENCOUNTER — Ambulatory Visit (INDEPENDENT_AMBULATORY_CARE_PROVIDER_SITE_OTHER): Payer: Medicare PPO | Admitting: Psychiatry

## 2022-04-09 VITALS — BP 151/74 | HR 80 | Temp 97.9°F | Wt 214.6 lb

## 2022-04-09 DIAGNOSIS — G4701 Insomnia due to medical condition: Secondary | ICD-10-CM

## 2022-04-09 DIAGNOSIS — F411 Generalized anxiety disorder: Secondary | ICD-10-CM

## 2022-04-09 DIAGNOSIS — F3342 Major depressive disorder, recurrent, in full remission: Secondary | ICD-10-CM | POA: Diagnosis not present

## 2022-04-09 MED ORDER — DULOXETINE HCL 60 MG PO CPEP: 60.0000 mg | ORAL_CAPSULE | Freq: Every day | ORAL | 1 refills | Status: DC

## 2022-04-09 MED ORDER — DULOXETINE HCL 20 MG PO CPEP: ORAL_CAPSULE | ORAL | 1 refills | Status: DC

## 2022-04-09 MED ORDER — BUPROPION HCL ER (XL) 150 MG PO TB24: 150.0000 mg | ORAL_TABLET | Freq: Every day | ORAL | 1 refills | Status: DC
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
## 2022-04-23 ENCOUNTER — Telehealth: Payer: Self-pay

## 2022-04-23 NOTE — Telephone Encounter (Signed)
Pt scheduled 8/15

## 2022-04-23 NOTE — Telephone Encounter (Signed)
Please call patient to get scheduled for surgery clearance, has surgery scheduled 05/08/22. Will place form in incomplete bin.

## 2022-04-24 DIAGNOSIS — M48062 Spinal stenosis, lumbar region with neurogenic claudication: Secondary | ICD-10-CM | POA: Diagnosis not present

## 2022-04-24 DIAGNOSIS — Z01818 Encounter for other preprocedural examination: Secondary | ICD-10-CM | POA: Diagnosis not present

## 2022-05-01 ENCOUNTER — Ambulatory Visit: Payer: Medicare PPO | Admitting: Family Medicine

## 2022-05-01 ENCOUNTER — Encounter: Payer: Self-pay | Admitting: Family Medicine

## 2022-05-01 VITALS — BP 106/56 | HR 92 | Temp 98.8°F | Wt 211.6 lb

## 2022-05-01 DIAGNOSIS — I10 Essential (primary) hypertension: Secondary | ICD-10-CM | POA: Diagnosis not present

## 2022-05-01 DIAGNOSIS — Z01818 Encounter for other preprocedural examination: Secondary | ICD-10-CM

## 2022-05-01 DIAGNOSIS — I952 Hypotension due to drugs: Secondary | ICD-10-CM

## 2022-05-01 LAB — URINALYSIS, ROUTINE W REFLEX MICROSCOPIC
Bilirubin, UA: NEGATIVE
Glucose, UA: NEGATIVE
Leukocytes,UA: NEGATIVE
Nitrite, UA: NEGATIVE
Protein,UA: NEGATIVE
RBC, UA: NEGATIVE
Specific Gravity, UA: 1.015 (ref 1.005–1.030)
Urobilinogen, Ur: 1 mg/dL (ref 0.2–1.0)
pH, UA: 7 (ref 5.0–7.5)

## 2022-05-01 MED ORDER — LOSARTAN POTASSIUM 100 MG PO TABS: 50.0000 mg | ORAL_TABLET | Freq: Every day | ORAL | 1 refills | Status: DC

## 2022-05-01 NOTE — Assessment & Plan Note (Signed)
Still running low, will cut his benazepril to '20mg'$  and recheck 1 month. Call with any concerns.

## 2022-05-01 NOTE — Progress Notes (Signed)
BP (!) 106/56   Pulse 92   Temp 98.8 F (37.1 C)   Wt 211 lb 9.6 oz (96 kg)   SpO2 97%   BMI 27.92 kg/m    Subjective:    Patient ID: Kenneth Pruitt, male    DOB: [birth year], 71 y.o.   MRN: [id number]  HPI: Kenneth Pruitt is a 71 y.o. male  Chief Complaint  Patient presents with   Hypertension    Patient states his cardiologist took him off of the amlodipine completely.    surgery clearance    Patient has surgery scheduled for 05/08/22   HYPERTENSION Hypertension status: overtreated  Satisfied with current treatment? no Duration of hypertension: chronic BP monitoring frequency:   occasionally BP medication side effects:  yes- dizziness Medication compliance: excellent compliance Previous BP meds:chlorthalidone, hydralazine, losartan Aspirin: yes Recurrent headaches: no Visual changes: no Palpitations: no Dyspnea: no Chest pain: no Lower extremity edema: no Dizzy/lightheaded: yes  Having spinal stenosis surgery on his lumbar next week. He is here for clearance. He has had surgeries in the past, his most recent one was in August 2022 on his clavicle. Never had any problems with anesthesia in the past. No post-op N/V, no issues with extubation. Has always gone home when he was supposed to No family history of issues with anesthesia, no family history of malignant hyperthermia. Able to walk up 2 flights of stairs. Rode 40 miles on his bike yesterday. No SOB, no CP. He is feeling well and very much looking forward to his surgery.   Active Ambulatory Problems    Diagnosis Date Noted   Hypogonadism in male 11/17/2015   Hyperlipidemia 11/17/2015   Essential hypertension 11/17/2015   Knee pain, right 08/06/2016   Elevated PSA 08/06/2016   Polycythemia, secondary 08/28/2016   Advanced care planning/counseling discussion 02/05/2017   Skin lesions 10/16/2017   BPH (benign prostatic hyperplasia) 10/16/2017   Arthritis of knee 12/18/2011   Erectile dysfunction 03/07/2015    Primary localized osteoarthrosis, lower leg 11/21/2011   Unstable angina ([place]) 03/24/2018   CAD (coronary artery disease) 03/27/2018   Hypokalemia 09/30/2018   S/P TKR (total knee replacement) using cement, right 10/08/2018   GAD (generalized anxiety disorder) 08/05/2019   Insomnia due to mental disorder 08/05/2019   Weakness of right leg 05/10/2019   Weakness of right arm 05/10/2019   OSA on CPAP 05/19/2019   Orthostasis 05/28/2019   Chronic kidney disease (CKD), stage III (moderate) ([employer]) 05/19/2019   Allergic rhinitis 05/26/2019   MDD (major depressive disorder), recurrent, in full remission ([place]) 12/31/2019   Bereavement 12/31/2019   Family history of prostate cancer 06/08/2020   Lumbar radiculopathy 08/31/2021   Clavicle pain 09/26/2021   Insomnia due to medical condition 11/14/2021   S/P cervical spinal fusion 12/12/2021   Neuropathic pain, leg, right 12/12/2021   Contusion of buttock 01/15/2022   Resolved Ambulatory Problems    Diagnosis Date Noted   Coronary artery disease 11/17/2015   Depression 11/17/2015   Elevated hematocrit 08/07/2016   MDD (major depressive disorder), recurrent episode, mild ([place]) 08/05/2019   Elevated glucose 05/19/2019   Coronary artery disease involving native coronary artery of native heart without angina pectoris 11/17/2015   Past Medical History:  Diagnosis Date   Arthritis    Skin cancer    Past Surgical History:  Procedure Laterality Date   ANKLE FRACTURE SURGERY Right    ARTHRODESIS ANT INTERBODY INC DISCECTOMY, CERVICAL BELOW C2    05/2019   CARDIAC  CATHETERIZATION N/A 04/26/2015   Procedure: Left Heart Cath;  Surgeon: Dionisio David, MD;  Location: [facility];  Service: Cardiovascular;  Laterality: N/A;   CLAVICLE SURGERY Right 05/04/2021   CORONARY ANGIOPLASTY     CORONARY STENT INTERVENTION N/A 03/27/2018   Procedure: CORONARY STENT INTERVENTION;  Surgeon: Yolonda Kida, MD;  Location: [facility];   Service: Cardiovascular;  Laterality: N/A;   EYE SURGERY Bilateral    cataract   LEFT HEART CATH AND CORONARY ANGIOGRAPHY Left 03/27/2018   Procedure: Left Heart Cath with possible coronary intervention;  Surgeon: Dionisio David, MD;  Location: [facility];  Service: Cardiovascular;  Laterality: Left;   TOTAL KNEE ARTHROPLASTY Right 10/08/2018   Procedure: TOTAL KNEE ARTHROPLASTY;  Surgeon: Lovell [name], MD;  Location: [facility];  Service: Orthopedics;  Laterality: Right;   Outpatient Encounter Medications as of 05/01/2022  Medication Sig Note   acetaminophen (TYLENOL) 500 MG tablet Take by mouth.    aspirin 81 MG EC tablet Take 1 tablet (81 mg total) by mouth daily. 05/01/2022: Stopped for procedure   buPROPion (WELLBUTRIN XL) 150 MG 24 hr tablet Take 1 tablet (150 mg total) by mouth daily with breakfast.    chlorthalidone (HYGROTON) 25 MG tablet Take 25 mg by mouth daily.    Cholecalciferol 125 MCG (5000 UT) capsule Take 5,000 Units by mouth daily.    clopidogrel (PLAVIX) 75 MG tablet Take 75 mg by mouth daily. 05/01/2022: Stopped for procedure   clotrimazole-betamethasone (LOTRISONE) cream Apply 1 application topically 2 (two) times daily.    diclofenac Sodium (VOLTAREN) 1 % GEL Voltaren 1 % topical gel  APPLY 2 GRAMS TO THE AFFECTED AREA(S) BY TOPICAL ROUTE 4 TIMES PER DAY    DULoxetine (CYMBALTA) 20 MG capsule Take 20 mg daily along with 60 mg - total of 80 mg .    DULoxetine (CYMBALTA) 60 MG capsule Take 1 capsule (60 mg total) by mouth daily. Take along with 20 mg - total of 80 mg    dutasteride (AVODART) 0.5 MG capsule Take 1 capsule (0.5 mg total) by mouth every evening.    fluticasone (FLONASE) 50 MCG/ACT nasal spray PLACE 2 SPRAYS INTO BOTH NOSTRILS DAILY    hydrALAZINE (APRESOLINE) 50 MG tablet Take 25 mg by mouth 2 (two) times daily. 05/01/2022: 12.'5mg'$  BID    hydrocortisone (ANUSOL-HC) 2.5 % rectal cream APPLY RECTALLY TWICE DAILY AS DIRECTED    hydrOXYzine (ATARAX) 25  MG tablet Take 25 mg by mouth every 8 (eight) hours as needed.    meloxicam (MOBIC) 15 MG tablet Take 1 tablet (15 mg total) by mouth daily.    mupirocin cream (BACTROBAN) 2 % Apply 1 application topically 2 (two) times daily.    niacin (NIASPAN) 1000 MG CR tablet Take 2 tablets (2,000 mg total) by mouth at bedtime.    pantoprazole (PROTONIX) 20 MG tablet Take 20 mg by mouth daily.     rosuvastatin (CRESTOR) 40 MG tablet Take 1 tablet (40 mg total) by mouth every evening.    tamsulosin (FLOMAX) 0.4 MG CAPS capsule Take 1 capsule (0.4 mg total) by mouth daily.    testosterone cypionate (DEPOTESTOSTERONE CYPIONATE) 200 MG/ML injection INJECT 0.75 ML EVERY 10 DAYS AS DIRECTED    VASCEPA 1 g capsule  02/21/2021: 2 tablets BID    vitamin B-12 (CYANOCOBALAMIN) 1000 MCG tablet Take 1,000 mcg by mouth daily.    [DISCONTINUED] losartan (COZAAR) 100 MG tablet TAKE ONE TABLET (100 MG)  BY MOUTH EVERY DAY    losartan (COZAAR) 100 MG tablet Take 0.5 tablets (50 mg total) by mouth daily. TAKE ONE TABLET (100 MG) BY MOUTH EVERY DAY    No facility-administered encounter medications on file as of 05/01/2022.   Allergies  Allergen Reactions   Clonidine Derivatives Shortness Of Breath and Other (See Comments)    Low heart rate   Clonidine     Mild bradycardia + Somnolence   Social History   Socioeconomic History   Marital status: Married    Spouse name: ann   Number of children: 2   Years of education: Not on file   Highest education level: Master's degree (e.g., MA, MS, MEng, MEd, MSW, MBA)  Occupational History    Comment: retired  Tobacco Use   Smoking status: Former    Types: Cigars    Quit date: 07/18/2006    Years since quitting: 15.7   Smokeless tobacco: Never  Vaping Use   Vaping Use: Never used  Substance and Sexual Activity   Alcohol use: No   Drug use: No   Sexual activity: Not Currently  Other Topics Concern   Not on file  Social History Narrative   Not on file   Social  Determinants of Health   Financial Resource Strain: Low Risk  (06/06/2021)   Overall Financial Resource Strain (CARDIA)    Difficulty of Paying Living Expenses: Not hard at all  Food Insecurity: No Food Insecurity (06/06/2021)   Hunger Vital Sign    Worried About Running Out of Food in the Last Year: Never true    [place] in the Last Year: Never true  Transportation Needs: No Transportation Needs (06/06/2021)   PRAPARE - Hydrologist (Medical): No    Lack of Transportation (Non-Medical): No  Physical Activity: Inactive (06/06/2021)   Exercise Vital Sign    Days of Exercise per Week: 0 days    Minutes of Exercise per Session: 0 min  Stress: No Stress Concern Present (06/06/2021)   [place]    Feeling of Stress : Not at all  Social Connections: Moderately Integrated (06/06/2021)   Social Connection and Isolation Panel [NHANES]    Frequency of Communication with Friends and Family: More than three times a week    Frequency of Social Gatherings with Friends and Family: Once a week    Attends Religious Services: More than 4 times per year    Active Member of [employer] or Organizations: No    Attends Archivist Meetings: Never    Marital Status: Married   Family History  Problem Relation Age of Onset   Diabetes Mother    Hypertension Mother    Cancer Father    Depression Father    Anxiety disorder Father    Depression Daughter     Review of Systems  Constitutional: Negative.   Respiratory: Negative.    Cardiovascular: Negative.   Gastrointestinal: Negative.   Musculoskeletal: Negative.   Neurological: Negative.   Psychiatric/Behavioral: Negative.      Per HPI unless specifically indicated above     Objective:    BP (!) 106/56   Pulse 92   Temp 98.8 F (37.1 C)   Wt 211 lb 9.6 oz (96 kg)   SpO2 97%   BMI 27.92 kg/m   Wt Readings from Last 3 Encounters:   05/01/22 211 lb 9.6 oz (96 kg)  03/01/22 214 lb (97.1  kg)  02/27/22 219 lb 4.8 oz (99.5 kg)    Physical Exam Vitals and nursing note reviewed.  Constitutional:      General: He is not in acute distress.    Appearance: Normal appearance. He is normal weight. He is not ill-appearing, toxic-appearing or diaphoretic.  HENT:     Head: Normocephalic and atraumatic.     Right Ear: External ear normal.     Left Ear: External ear normal.     Nose: Nose normal.     Mouth/Throat:     Mouth: Mucous membranes are moist.     Pharynx: Oropharynx is clear.  Eyes:     General: No scleral icterus.       Right eye: No discharge.        Left eye: No discharge.     Extraocular Movements: Extraocular movements intact.     Conjunctiva/sclera: Conjunctivae normal.     Pupils: Pupils are equal, round, and reactive to light.  Cardiovascular:     Rate and Rhythm: Normal rate and regular rhythm.     Pulses: Normal pulses.     Heart sounds: Normal heart sounds. No murmur heard.    No friction rub. No gallop.  Pulmonary:     Effort: Pulmonary effort is normal. No respiratory distress.     Breath sounds: Normal breath sounds. No stridor. No wheezing, rhonchi or rales.  Chest:     Chest wall: No tenderness.  Musculoskeletal:        General: Normal range of motion.     Cervical back: Normal range of motion and neck supple.  Skin:    General: Skin is warm and dry.     Capillary Refill: Capillary refill takes less than 2 seconds.     Coloration: Skin is not jaundiced or pale.     Findings: No bruising, erythema, lesion or rash.  Neurological:     General: No focal deficit present.     Mental Status: He is alert and oriented to person, place, and time. Mental status is at baseline.  Psychiatric:        Mood and Affect: Mood normal.        Behavior: Behavior normal.        Thought Content: Thought content normal.        Judgment: Judgment normal.     Results for orders placed or performed in visit  on 05/01/22  Urinalysis, Routine w reflex microscopic  Result Value Ref Range   Specific Gravity, UA 1.015 1.005 - 1.030   pH, UA 7.0 5.0 - 7.5   Color, UA Yellow Yellow   Appearance Ur Clear Clear   Leukocytes,UA Negative Negative   Protein,UA Negative Negative/Trace   Glucose, UA Negative Negative   Ketones, UA Trace (A) Negative   RBC, UA Negative Negative   Bilirubin, UA Negative Negative   Urobilinogen, Ur 1.0 0.2 - 1.0 mg/dL   Nitrite, UA Negative Negative      Assessment & Plan:   Problem List Items Addressed This Visit       Cardiovascular and Mediastinum   Essential hypertension    Still running low, will cut his benazepril to '20mg'$  and recheck 1 month. Call with any concerns.       Relevant Medications   losartan (COZAAR) 100 MG tablet   Other Visit Diagnoses     Pre-op evaluation    -  Primary   EKG normal. Will await labs and if normal clear for surgery. Await results.  Relevant Orders   EKG 12-Lead (Completed)   Comprehensive metabolic panel   CBC with Differential/Platelet   Hgb A1c w/o eAG   Urinalysis, Routine w reflex microscopic (Completed)   Hypotension due to drugs       Still running low, will cut his benazepril to '20mg'$  and recheck 1 month. Call with any concerns.    Relevant Medications   losartan (COZAAR) 100 MG tablet        Follow up plan: Return in about 4 weeks (around 05/29/2022).

## 2022-05-02 DIAGNOSIS — M5451 Vertebrogenic low back pain: Secondary | ICD-10-CM | POA: Diagnosis not present

## 2022-05-02 DIAGNOSIS — R262 Difficulty in walking, not elsewhere classified: Secondary | ICD-10-CM | POA: Diagnosis not present

## 2022-05-02 LAB — COMPREHENSIVE METABOLIC PANEL
ALT: 18 IU/L (ref 0–44)
AST: 17 IU/L (ref 0–40)
Albumin/Globulin Ratio: 2.4 — ABNORMAL HIGH (ref 1.2–2.2)
Albumin: 4.6 g/dL (ref 3.8–4.8)
Alkaline Phosphatase: 53 IU/L (ref 44–121)
BUN/Creatinine Ratio: 18 (ref 10–24)
BUN: 25 mg/dL (ref 8–27)
Bilirubin Total: 0.5 mg/dL (ref 0.0–1.2)
CO2: 22 mmol/L (ref 20–29)
Calcium: 9.5 mg/dL (ref 8.6–10.2)
Chloride: 100 mmol/L (ref 96–106)
Creatinine, Ser: 1.39 mg/dL — ABNORMAL HIGH (ref 0.76–1.27)
Globulin, Total: 1.9 g/dL (ref 1.5–4.5)
Glucose: 176 mg/dL — ABNORMAL HIGH (ref 70–99)
Potassium: 3.3 mmol/L — ABNORMAL LOW (ref 3.5–5.2)
Sodium: 140 mmol/L (ref 134–144)
Total Protein: 6.5 g/dL (ref 6.0–8.5)
eGFR: 54 mL/min/{1.73_m2} — ABNORMAL LOW (ref >59)

## 2022-05-02 LAB — CBC WITH DIFFERENTIAL/PLATELET
Basophils Absolute: 0 10*3/uL (ref 0.0–0.2)
Basos: 1 %
EOS (ABSOLUTE): 0.1 10*3/uL (ref 0.0–0.4)
Eos: 2 %
Hematocrit: 48.1 % (ref 37.5–51.0)
Hemoglobin: 16.6 g/dL (ref 13.0–17.7)
Immature Grans (Abs): 0 10*3/uL (ref 0.0–0.1)
Immature Granulocytes: 0 %
Lymphocytes Absolute: 1.1 10*3/uL (ref 0.7–3.1)
Lymphs: 29 %
MCH: 30.9 pg (ref 26.6–33.0)
MCHC: 34.5 g/dL (ref 31.5–35.7)
MCV: 90 fL (ref 79–97)
Monocytes Absolute: 0.3 10*3/uL (ref 0.1–0.9)
Monocytes: 8 %
Neutrophils Absolute: 2.4 10*3/uL (ref 1.4–7.0)
Neutrophils: 60 %
Platelets: 207 10*3/uL (ref 150–450)
RBC: 5.37 x10E6/uL (ref 4.14–5.80)
RDW: 13.3 % (ref 11.6–15.4)
WBC: 3.9 10*3/uL (ref 3.4–10.8)

## 2022-05-02 LAB — HGB A1C W/O EAG: Hgb A1c MFr Bld: 6.1 % — ABNORMAL HIGH (ref 4.8–5.6)

## 2022-05-02 NOTE — Progress Notes (Signed)
Interpreted by me on 05/01/22. NSR at 84bpm. No significant change from 04/22/21

## 2022-05-02 NOTE — Progress Notes (Signed)
Labs reviewed. Cleared for surgery. Will send paperwork to his surgeon.

## 2022-05-02 NOTE — Progress Notes (Signed)
Patient notified of results  by [facility] are stable. A1c in prediabetic range. Will clear for surgery.

## 2022-05-08 DIAGNOSIS — M48062 Spinal stenosis, lumbar region with neurogenic claudication: Secondary | ICD-10-CM | POA: Diagnosis not present

## 2022-05-08 DIAGNOSIS — M4726 Other spondylosis with radiculopathy, lumbar region: Secondary | ICD-10-CM | POA: Diagnosis not present

## 2022-05-08 DIAGNOSIS — M4807 Spinal stenosis, lumbosacral region: Secondary | ICD-10-CM | POA: Diagnosis not present

## 2022-05-08 DIAGNOSIS — M4727 Other spondylosis with radiculopathy, lumbosacral region: Secondary | ICD-10-CM | POA: Diagnosis not present

## 2022-05-08 DIAGNOSIS — M4317 Spondylolisthesis, lumbosacral region: Secondary | ICD-10-CM | POA: Diagnosis not present

## 2022-05-08 DIAGNOSIS — R338 Other retention of urine: Secondary | ICD-10-CM | POA: Diagnosis not present

## 2022-05-08 DIAGNOSIS — M4316 Spondylolisthesis, lumbar region: Secondary | ICD-10-CM | POA: Diagnosis not present

## 2022-05-08 DIAGNOSIS — I2510 Atherosclerotic heart disease of native coronary artery without angina pectoris: Secondary | ICD-10-CM | POA: Diagnosis not present

## 2022-05-08 DIAGNOSIS — N400 Enlarged prostate without lower urinary tract symptoms: Secondary | ICD-10-CM | POA: Diagnosis not present

## 2022-05-08 DIAGNOSIS — N189 Chronic kidney disease, unspecified: Secondary | ICD-10-CM | POA: Diagnosis not present

## 2022-05-08 HISTORY — PX: LUMBAR FUSION: SHX111

## 2022-05-11 DIAGNOSIS — R262 Difficulty in walking, not elsewhere classified: Secondary | ICD-10-CM | POA: Diagnosis not present

## 2022-05-11 DIAGNOSIS — M5451 Vertebrogenic low back pain: Secondary | ICD-10-CM | POA: Diagnosis not present

## 2022-05-15 DIAGNOSIS — M5451 Vertebrogenic low back pain: Secondary | ICD-10-CM | POA: Diagnosis not present

## 2022-05-17 DIAGNOSIS — M5451 Vertebrogenic low back pain: Secondary | ICD-10-CM | POA: Diagnosis not present

## 2022-05-17 DIAGNOSIS — R262 Difficulty in walking, not elsewhere classified: Secondary | ICD-10-CM | POA: Diagnosis not present

## 2022-05-24 ENCOUNTER — Other Ambulatory Visit: Payer: Self-pay

## 2022-05-24 DIAGNOSIS — E291 Testicular hypofunction: Secondary | ICD-10-CM

## 2022-05-24 DIAGNOSIS — N401 Enlarged prostate with lower urinary tract symptoms: Secondary | ICD-10-CM

## 2022-05-24 DIAGNOSIS — Z4889 Encounter for other specified surgical aftercare: Secondary | ICD-10-CM | POA: Diagnosis not present

## 2022-05-25 DIAGNOSIS — R262 Difficulty in walking, not elsewhere classified: Secondary | ICD-10-CM | POA: Diagnosis not present

## 2022-05-25 DIAGNOSIS — M5451 Vertebrogenic low back pain: Secondary | ICD-10-CM | POA: Diagnosis not present

## 2022-05-28 ENCOUNTER — Other Ambulatory Visit: Payer: Self-pay | Admitting: Family Medicine

## 2022-05-29 NOTE — Telephone Encounter (Signed)
Requested medication (s) are due for refill today: yes  Requested medication (s) are on the active medication list: yes  Last refill:  06/06/21 30 grams 2 RF  Future visit scheduled: yes  Notes to clinic:  med not assigned to a protocol   Requested Prescriptions  Pending Prescriptions Disp Refills   mupirocin ointment (BACTROBAN) 2 % [Pharmacy Med Name: MUPIROCIN 2% TOP OINT GM] 22 g     Sig: APPLY 1 APPLICATION TOPICALLY 2 TIMES DAILY     Off-Protocol Failed - 05/28/2022  9:27 AM      Failed - Medication not assigned to a protocol, review manually.      Passed - Valid encounter within last 12 months    Recent Outpatient Visits           4 weeks ago Pre-op evaluation   [name], [name], DO   2 months ago Essential hypertension   [name], [name], DO   9 months ago Routine general medical examination at a health care facility   [name], [name], DO   11 months ago Abrasion of right knee, initial encounter   [name] [name], DO   1 year ago Essential hypertension   [name], [name], DO       Future Appointments             In 6 days [name], [name], DO [place], [name]   In 1 week [name], [name], MD [name]   In 3 months [name], MD [name]

## 2022-05-30 ENCOUNTER — Telehealth: Payer: Self-pay

## 2022-05-30 NOTE — Telephone Encounter (Signed)
Called and LVM asking for patient to please return my call to discuss scheduling his annual wellness exam.   OK for PEC to speak to patient if he calls back. Patient is due after 06/06/22. Can be scheduled for a virtual wellness with me starting on 06/07/22 or I can do it in office when he sees Dr. [name] on 06/11/22. Patient can come it at 8:40 that morning to do wellness before he sees Dr. [name], or I can complete it after the visit with Dr. [name].

## 2022-06-04 ENCOUNTER — Ambulatory Visit: Payer: Medicare PPO | Admitting: Family Medicine

## 2022-06-04 DIAGNOSIS — M2021 Hallux rigidus, right foot: Secondary | ICD-10-CM | POA: Diagnosis not present

## 2022-06-04 DIAGNOSIS — M79672 Pain in left foot: Secondary | ICD-10-CM | POA: Diagnosis not present

## 2022-06-04 DIAGNOSIS — M2020 Hallux rigidus, unspecified foot: Secondary | ICD-10-CM | POA: Insufficient documentation

## 2022-06-04 DIAGNOSIS — M19072 Primary osteoarthritis, left ankle and foot: Secondary | ICD-10-CM | POA: Diagnosis not present

## 2022-06-04 DIAGNOSIS — M79673 Pain in unspecified foot: Secondary | ICD-10-CM | POA: Insufficient documentation

## 2022-06-04 DIAGNOSIS — M2042 Other hammer toe(s) (acquired), left foot: Secondary | ICD-10-CM | POA: Diagnosis not present

## 2022-06-04 DIAGNOSIS — M79671 Pain in right foot: Secondary | ICD-10-CM | POA: Diagnosis not present

## 2022-06-04 DIAGNOSIS — M19071 Primary osteoarthritis, right ankle and foot: Secondary | ICD-10-CM | POA: Diagnosis not present

## 2022-06-04 DIAGNOSIS — M2041 Other hammer toe(s) (acquired), right foot: Secondary | ICD-10-CM | POA: Diagnosis not present

## 2022-06-04 DIAGNOSIS — M205X2 Other deformities of toe(s) (acquired), left foot: Secondary | ICD-10-CM | POA: Diagnosis not present

## 2022-06-04 DIAGNOSIS — M2040 Other hammer toe(s) (acquired), unspecified foot: Secondary | ICD-10-CM | POA: Insufficient documentation

## 2022-06-04 DIAGNOSIS — M205X1 Other deformities of toe(s) (acquired), right foot: Secondary | ICD-10-CM | POA: Diagnosis not present

## 2022-06-05 ENCOUNTER — Other Ambulatory Visit: Payer: Medicare PPO

## 2022-06-05 DIAGNOSIS — N401 Enlarged prostate with lower urinary tract symptoms: Secondary | ICD-10-CM

## 2022-06-05 DIAGNOSIS — E291 Testicular hypofunction: Secondary | ICD-10-CM | POA: Diagnosis not present

## 2022-06-07 ENCOUNTER — Ambulatory Visit: Payer: Medicare PPO | Admitting: Urology

## 2022-06-07 LAB — TESTOSTERONE: Testosterone: 192 ng/dL — ABNORMAL LOW (ref 264–916)

## 2022-06-07 LAB — PSA: Prostate Specific Ag, Serum: 2.7 ng/mL (ref 0.0–4.0)

## 2022-06-07 LAB — HEMATOCRIT: Hematocrit: 49.2 % (ref 37.5–51.0)

## 2022-06-08 ENCOUNTER — Ambulatory Visit: Payer: Medicare PPO | Admitting: Urology

## 2022-06-08 ENCOUNTER — Encounter: Payer: Self-pay | Admitting: Urology

## 2022-06-08 VITALS — BP 120/73 | HR 92 | Ht 73.0 in | Wt 195.0 lb

## 2022-06-08 DIAGNOSIS — N401 Enlarged prostate with lower urinary tract symptoms: Secondary | ICD-10-CM

## 2022-06-08 DIAGNOSIS — E291 Testicular hypofunction: Secondary | ICD-10-CM | POA: Diagnosis not present

## 2022-06-08 MED ORDER — MIRABEGRON ER 50 MG PO TB24: 50.0000 mg | ORAL_TABLET | Freq: Every day | ORAL | 0 refills | Status: DC

## 2022-06-08 MED ORDER — TESTOSTERONE CYPIONATE 200 MG/ML IM SOLN: INTRAMUSCULAR | 0 refills | Status: DC

## 2022-06-08 NOTE — Progress Notes (Signed)
06/08/2022 8:06 AM   Kenneth Pruitt 04-Jul-1951 [id number]  Referring provider: [name], DO [place],   [id number]  Chief Complaint  Patient presents with   Hypogonadism    HPI: 71 y.o.male presents for follow-up of hypogonadism.  No significant problems since last years visit.  He was given Myrbetriq samples for urgency last year which he felt did help.  He had lumbar spine surgery 1 month ago and some increased urgency with rare episodes of urge incontinence Remains on tamsulosin/dutasteride Denies dysuria, gross hematuria No flank, abdominal or pelvic pain Labs 06/05/2022: Testosterone 192 ng/dL; PSA 2.7; hematocrit 49.2.  He was 17 days since his last injection at the time of this blood draw   PMH: Past Medical History:  Diagnosis Date   Arthritis    Coronary artery disease    Depression    Hyperlipidemia    Skin cancer    left forearm treated years ago by Dr. Golden Pop    Surgical History: Past Surgical History:  Procedure Laterality Date   ANKLE FRACTURE SURGERY Right    ARTHRODESIS ANT INTERBODY INC DISCECTOMY, CERVICAL BELOW C2    05/2019   CARDIAC CATHETERIZATION N/A 04/26/2015   Procedure: Left Heart Cath;  Surgeon: Dionisio David, MD;  Location: [facility];  Service: Cardiovascular;  Laterality: N/A;   CLAVICLE SURGERY Right 05/04/2021   CORONARY ANGIOPLASTY     CORONARY STENT INTERVENTION N/A 03/27/2018   Procedure: CORONARY STENT INTERVENTION;  Surgeon: Yolonda Kida, MD;  Location: [facility];  Service: Cardiovascular;  Laterality: N/A;   EYE SURGERY Bilateral    cataract   LEFT HEART CATH AND CORONARY ANGIOGRAPHY Left 03/27/2018   Procedure: Left Heart Cath with possible coronary intervention;  Surgeon: Dionisio David, MD;  Location: [place];  Service: Cardiovascular;  Laterality: Left;   TOTAL KNEE ARTHROPLASTY Right 10/08/2018   Procedure: TOTAL KNEE ARTHROPLASTY;  Surgeon: Lovell [name], MD;  Location: [facility];  Service: Orthopedics;  Laterality: Right;    Home Medications:  Allergies as of 06/08/2022       Reactions   Clonidine Derivatives Shortness Of Breath, Other (See Comments)   Low heart rate   Clonidine    Mild bradycardia + Somnolence        Medication List        Accurate as of June 08, 2022  8:06 AM. If you have any questions, ask your nurse or doctor.          acetaminophen 500 MG tablet Commonly known as: TYLENOL Take by mouth.   aspirin EC 81 MG tablet Take 1 tablet (81 mg total) by mouth daily.   buPROPion 150 MG 24 hr tablet Commonly known as: WELLBUTRIN XL Take 1 tablet (150 mg total) by mouth daily with breakfast.   chlorthalidone 25 MG tablet Commonly known as: HYGROTON Take 25 mg by mouth daily.   Cholecalciferol 125 MCG (5000 UT) capsule Take 5,000 Units by mouth daily.   clopidogrel 75 MG tablet Commonly known as: PLAVIX Take 75 mg by mouth daily.   clotrimazole-betamethasone cream Commonly known as: Lotrisone Apply 1 application topically 2 (two) times daily.   cyanocobalamin 1000 MCG tablet Commonly known as: VITAMIN B12 Take 1,000 mcg by mouth daily.   diclofenac Sodium 1 % Gel Commonly known as: VOLTAREN Voltaren 1 % topical gel  APPLY 2 GRAMS TO THE AFFECTED AREA(S) BY TOPICAL ROUTE 4 TIMES PER DAY  DULoxetine 60 MG capsule Commonly known as: Cymbalta Take 1 capsule (60 mg total) by mouth daily. Take along with 20 mg - total of 80 mg   DULoxetine 20 MG capsule Commonly known as: CYMBALTA Take 20 mg daily along with 60 mg - total of 80 mg .   dutasteride 0.5 MG capsule Commonly known as: AVODART Take 1 capsule (0.5 mg total) by mouth every evening.   fluticasone 50 MCG/ACT nasal spray Commonly known as: FLONASE PLACE 2 SPRAYS INTO BOTH NOSTRILS DAILY   hydrALAZINE 50 MG tablet Commonly known as: APRESOLINE Take 25 mg by mouth 2 (two) times daily.   hydrocortisone 2.5 % rectal  cream Commonly known as: ANUSOL-HC APPLY RECTALLY TWICE DAILY AS DIRECTED   hydrOXYzine 25 MG tablet Commonly known as: ATARAX Take 25 mg by mouth every 8 (eight) hours as needed.   losartan 100 MG tablet Commonly known as: COZAAR Take 0.5 tablets (50 mg total) by mouth daily. TAKE ONE TABLET (100 MG) BY MOUTH EVERY DAY   meloxicam 15 MG tablet Commonly known as: MOBIC Take 1 tablet (15 mg total) by mouth daily.   mupirocin ointment 2 % Commonly known as: BACTROBAN APPLY 1 APPLICATION TOPICALLY 2 TIMES DAILY   niacin 1000 MG CR tablet Commonly known as: NIASPAN Take 2 tablets (2,000 mg total) by mouth at bedtime.   pantoprazole 20 MG tablet Commonly known as: PROTONIX Take 20 mg by mouth daily.   rosuvastatin 40 MG tablet Commonly known as: CRESTOR Take 1 tablet (40 mg total) by mouth every evening.   tamsulosin 0.4 MG Caps capsule Commonly known as: FLOMAX Take 1 capsule (0.4 mg total) by mouth daily.   testosterone cypionate 200 MG/ML injection Commonly known as: DEPOTESTOSTERONE CYPIONATE INJECT 0.75 ML EVERY 10 DAYS AS DIRECTED   Vascepa 1 g capsule Generic drug: icosapent Ethyl        Allergies:  Allergies  Allergen Reactions   Clonidine Derivatives Shortness Of Breath and Other (See Comments)    Low heart rate   Clonidine     Mild bradycardia + Somnolence    Family History: Family History  Problem Relation Age of Onset   Diabetes Mother    Hypertension Mother    Cancer Father    Depression Father    Anxiety disorder Father    Depression Daughter     Social History:  reports that he quit smoking about 15 years ago. His smoking use included cigars. He has never used smokeless tobacco. He reports that he does not drink alcohol and does not use drugs.   Physical Exam: BP 120/73   Pulse 92   Ht '6\' 1"'$  (1.854 m)   Wt 195 lb (88.5 kg)   BMI 25.73 kg/m   Constitutional:  Alert and oriented, No acute distress. HEENT: [place] AT, moist mucus  membranes.  Trachea midline, no masses. Cardiovascular: No clubbing, cyanosis, or edema. Respiratory: Normal respiratory effort, no increased work of breathing.   Assessment & Plan:    Hypogonadism Stable on TRT Lab visit 6 months testosterone/hematocrit 1 year follow-up PSA, testosterone, hematocrit   2.  BPH with LUTS Some increased urinary urgency Myrbetriq 50 mg daily-samples given Continue tamsulosin/dutasteride   Abbie Sons, MD  [facility] [address], [place], [place] [address] [phone]

## 2022-06-11 ENCOUNTER — Encounter: Payer: Self-pay | Admitting: Family Medicine

## 2022-06-11 ENCOUNTER — Ambulatory Visit: Payer: Medicare PPO | Admitting: Family Medicine

## 2022-06-11 VITALS — BP 119/76 | HR 91 | Temp 98.4°F | Wt 204.1 lb

## 2022-06-11 DIAGNOSIS — I10 Essential (primary) hypertension: Secondary | ICD-10-CM | POA: Diagnosis not present

## 2022-06-11 DIAGNOSIS — Z23 Encounter for immunization: Secondary | ICD-10-CM | POA: Diagnosis not present

## 2022-06-11 MED ORDER — LOSARTAN POTASSIUM 50 MG PO TABS: 50.0000 mg | ORAL_TABLET | Freq: Every day | ORAL | 1 refills | Status: DC

## 2022-06-11 NOTE — Progress Notes (Signed)
BP 119/76   Pulse 91   Temp 98.4 F (36.9 C)   Wt 204 lb 1.6 oz (92.6 kg)   SpO2 97%   BMI 26.93 kg/m    Subjective:    Patient ID: Kenneth Pruitt, male    DOB: [birth year], 71 y.o.   MRN: [id number]  HPI: Kenneth Pruitt is a 71 y.o. male  Chief Complaint  Patient presents with   Hypotension   HYPOTENSION  Hypotension status: better  Satisfied with current treatment? yes Duration of hypertension: chronic BP monitoring frequency:  a few times a month BP medication side effects:  no Medication compliance: excellent compliance Previous BP meds:losartan Aspirin: no Recurrent headaches: no Visual changes: no Palpitations: no Dyspnea: no Chest pain: no Lower extremity edema: no Dizzy/lightheaded: no  Relevant past medical, surgical, family and social history reviewed and updated as indicated. Interim medical history since our last visit reviewed. Allergies and medications reviewed and updated.  Review of Systems  Constitutional: Negative.   Respiratory: Negative.    Cardiovascular: Negative.   Musculoskeletal: Negative.   Neurological: Negative.   Psychiatric/Behavioral: Negative.      Per HPI unless specifically indicated above     Objective:    BP 119/76   Pulse 91   Temp 98.4 F (36.9 C)   Wt 204 lb 1.6 oz (92.6 kg)   SpO2 97%   BMI 26.93 kg/m   Wt Readings from Last 3 Encounters:  06/11/22 204 lb 1.6 oz (92.6 kg)  06/08/22 195 lb (88.5 kg)  05/01/22 211 lb 9.6 oz (96 kg)    Physical Exam Vitals and nursing note reviewed.  Constitutional:      General: He is not in acute distress.    Appearance: Normal appearance. He is normal weight. He is not ill-appearing, toxic-appearing or diaphoretic.  HENT:     Head: Normocephalic and atraumatic.     Right Ear: External ear normal.     Left Ear: External ear normal.     Nose: Nose normal.     Mouth/Throat:     Mouth: Mucous membranes are moist.     Pharynx: Oropharynx is clear.  Eyes:      General: No scleral icterus.       Right eye: No discharge.        Left eye: No discharge.     Extraocular Movements: Extraocular movements intact.     Conjunctiva/sclera: Conjunctivae normal.     Pupils: Pupils are equal, round, and reactive to light.  Cardiovascular:     Rate and Rhythm: Normal rate and regular rhythm.     Pulses: Normal pulses.     Heart sounds: Normal heart sounds. No murmur heard.    No friction rub. No gallop.  Pulmonary:     Effort: Pulmonary effort is normal. No respiratory distress.     Breath sounds: Normal breath sounds. No stridor. No wheezing, rhonchi or rales.  Chest:     Chest wall: No tenderness.  Musculoskeletal:        General: Normal range of motion.     Cervical back: Normal range of motion and neck supple.  Skin:    General: Skin is warm and dry.     Capillary Refill: Capillary refill takes less than 2 seconds.     Coloration: Skin is not jaundiced or pale.     Findings: No bruising, erythema, lesion or rash.  Neurological:     General: No focal deficit present.  Mental Status: He is alert and oriented to person, place, and time. Mental status is at baseline.  Psychiatric:        Mood and Affect: Mood normal.        Behavior: Behavior normal.        Thought Content: Thought content normal.        Judgment: Judgment normal.     Results for orders placed or performed in visit on 06/05/22  Hematocrit  Result Value Ref Range   Hematocrit 49.2 37.5 - 51.0 %  PSA  Result Value Ref Range   Prostate Specific Ag, Serum 2.7 0.0 - 4.0 ng/mL  Testosterone  Result Value Ref Range   Testosterone 192 (L) 264 - 916 ng/dL      Assessment & Plan:   Problem List Items Addressed This Visit       Cardiovascular and Mediastinum   Essential hypertension - Primary    Doing much better. Continue current regimen. Call with any concerns.       Relevant Medications   losartan (COZAAR) 50 MG tablet   Other Relevant Orders   Basic metabolic panel    Other Visit Diagnoses     Need for influenza vaccination       Relevant Orders   Flu Vaccine QUAD High Dose(Fluad) (Completed)        Follow up plan: Return in about 5 months (around 11/11/2022), or physical.

## 2022-06-11 NOTE — Assessment & Plan Note (Signed)
Doing much better. Continue current regimen. Call with any concerns.

## 2022-06-12 ENCOUNTER — Ambulatory Visit (INDEPENDENT_AMBULATORY_CARE_PROVIDER_SITE_OTHER): Payer: Medicare PPO | Admitting: *Deleted

## 2022-06-12 ENCOUNTER — Other Ambulatory Visit: Payer: Self-pay | Admitting: Family Medicine

## 2022-06-12 DIAGNOSIS — Z0000 Encounter for general adult medical examination without abnormal findings: Secondary | ICD-10-CM

## 2022-06-12 DIAGNOSIS — E782 Mixed hyperlipidemia: Secondary | ICD-10-CM

## 2022-06-12 LAB — BASIC METABOLIC PANEL
BUN/Creatinine Ratio: 14 (ref 10–24)
BUN: 17 mg/dL (ref 8–27)
CO2: 23 mmol/L (ref 20–29)
Calcium: 9.2 mg/dL (ref 8.6–10.2)
Chloride: 100 mmol/L (ref 96–106)
Creatinine, Ser: 1.18 mg/dL (ref 0.76–1.27)
Glucose: 129 mg/dL — ABNORMAL HIGH (ref 70–99)
Potassium: 3.5 mmol/L (ref 3.5–5.2)
Sodium: 139 mmol/L (ref 134–144)
eGFR: 66 mL/min/{1.73_m2} (ref >59)

## 2022-06-12 NOTE — Telephone Encounter (Signed)
Refilled 03/01/2022 #[id number] rf. Requested Prescriptions  Pending Prescriptions Disp Refills  . rosuvastatin (CRESTOR) 40 MG tablet [Pharmacy Med Name: ROSUVASTATIN CALCIUM 40 MG TAB] 90 tablet 1    Sig: TAKE 1 TABLET BY MOUTH EVERY EVENING     Cardiovascular:  Antilipid - Statins 2 Failed - 06/12/2022 11:57 AM      Failed - Lipid Panel in normal range within the last 12 months    Cholesterol, Total  Date Value Ref Range Status  03/01/2022 94 (L) 100 - 199 mg/dL Final   Cholesterol Piccolo, Waived  Date Value Ref Range Status  08/06/2016 115 <200 mg/dL Final    Comment:                            Desirable                <200                         Borderline High      200- 239                         High                     >239    LDL Chol Calc (NIH)  Date Value Ref Range Status  03/01/2022 29 0 - 99 mg/dL Final   HDL  Date Value Ref Range Status  03/01/2022 43 >39 mg/dL Final   Triglycerides  Date Value Ref Range Status  03/01/2022 126 0 - 149 mg/dL Final   Triglycerides Piccolo,Waived  Date Value Ref Range Status  08/06/2016 188 (H) <150 mg/dL Final    Comment:                            Normal                   <150                         Borderline High     150 - 199                         High                200 - 499                         Very High                >499          Passed - Cr in normal range and within 360 days    Creatinine, Ser  Date Value Ref Range Status  06/11/2022 1.18 0.76 - 1.27 mg/dL Final         Passed - Patient is not pregnant      Passed - Valid encounter within last 12 months    Recent Outpatient Visits          Yesterday Essential hypertension   [place], [name], DO   1 month ago Pre-op evaluation   [place], [place], DO   3 months ago Essential hypertension   [place], [place], DO  9 months ago Routine general medical examination at a health  care facility   [place], [place], DO   1 year ago Abrasion of right knee, initial encounter   [facility] [name], DO      Future Appointments            In 2 months [name], DO [facility], [place]   In 2 months [name], MD [place]   In 5 months [name], [name], DO [facility], [place]   In 1 year [place], [name], [facility]

## 2022-06-12 NOTE — Progress Notes (Signed)
Subjective:   Kenneth Pruitt is a 71 y.o. male who presents for Medicare Annual/Subsequent preventive examination.   I connected with  [name] on 06/12/22 by a telephone enabled telemedicine application and verified that I am speaking with the correct person using two identifiers.   I discussed the limitations of evaluation and management by telemedicine. The patient expressed understanding and agreed to proceed.  Patient location: home  Provider location:  Tele-health-home    Review of Systems     Cardiac Risk Factors include: advanced age (>87mn, >>71women);hypertension;male gender;obesity (BMI >30kg/m2)     Objective:    Today's Vitals   There is no height or weight on file to calculate BMI.     06/12/2022    9:35 AM 02/27/2022    1:06 PM 12/12/2021    9:12 AM 09/25/2021    1:11 PM 06/06/2021    5:27 PM 04/22/2021   12:57 PM 03/21/2021    2:34 PM  Advanced Directives  Does Patient Have a Medical Advance Directive? No No No No No No No  Would patient like information on creating a medical advance directive? No - Patient declined No - Patient declined No - Patient declined No - Patient declined No - Patient declined  No - Patient declined    Current Medications (verified) Outpatient Encounter Medications as of 06/12/2022  Medication Sig   acetaminophen (TYLENOL) 500 MG tablet Take by mouth.   amoxicillin (AMOXIL) 500 MG capsule Take 1,000 mg by mouth 2 (two) times daily.   aspirin 81 MG EC tablet Take 1 tablet (81 mg total) by mouth daily.   buPROPion (WELLBUTRIN XL) 150 MG 24 hr tablet Take 1 tablet (150 mg total) by mouth daily with breakfast.   chlorthalidone (HYGROTON) 25 MG tablet Take 25 mg by mouth daily.   Cholecalciferol 125 MCG (5000 UT) capsule Take 5,000 Units by mouth daily.   clopidogrel (PLAVIX) 75 MG tablet Take 75 mg by mouth daily.   clotrimazole-betamethasone (LOTRISONE) cream Apply 1 application topically 2 (two) times daily.   diclofenac  Sodium (VOLTAREN) 1 % GEL Voltaren 1 % topical gel  APPLY 2 GRAMS TO THE AFFECTED AREA(S) BY TOPICAL ROUTE 4 TIMES PER DAY   DULoxetine (CYMBALTA) 20 MG capsule Take 20 mg daily along with 60 mg - total of 80 mg .   DULoxetine (CYMBALTA) 60 MG capsule Take 1 capsule (60 mg total) by mouth daily. Take along with 20 mg - total of 80 mg   dutasteride (AVODART) 0.5 MG capsule Take 1 capsule (0.5 mg total) by mouth every evening.   fluticasone (FLONASE) 50 MCG/ACT nasal spray PLACE 2 SPRAYS INTO BOTH NOSTRILS DAILY   hydrALAZINE (APRESOLINE) 50 MG tablet Take 25 mg by mouth 2 (two) times daily.   HYDROcodone-acetaminophen (NORCO/VICODIN) 5-325 MG tablet Take 1 tablet by mouth every 6 (six) hours as needed.   hydrocortisone (ANUSOL-HC) 2.5 % rectal cream APPLY RECTALLY TWICE DAILY AS DIRECTED   hydrOXYzine (ATARAX) 25 MG tablet Take 25 mg by mouth every 8 (eight) hours as needed.   losartan (COZAAR) 50 MG tablet Take 1 tablet (50 mg total) by mouth daily.   meloxicam (MOBIC) 15 MG tablet Take 1 tablet (15 mg total) by mouth daily.   mirabegron ER (MYRBETRIQ) 50 MG TB24 tablet Take 1 tablet (50 mg total) by mouth daily.   mupirocin ointment (BACTROBAN) 2 % APPLY 1 APPLICATION TOPICALLY 2 TIMES DAILY   niacin (NIASPAN) 1000 MG CR tablet Take  2 tablets (2,000 mg total) by mouth at bedtime.   pantoprazole (PROTONIX) 20 MG tablet Take 20 mg by mouth daily.    rosuvastatin (CRESTOR) 40 MG tablet Take 1 tablet (40 mg total) by mouth every evening.   tamsulosin (FLOMAX) 0.4 MG CAPS capsule Take 1 capsule (0.4 mg total) by mouth daily.   testosterone cypionate (DEPOTESTOSTERONE CYPIONATE) 200 MG/ML injection INJECT 0.75 ML EVERY 10 DAYS AS DIRECTED   VASCEPA 1 g capsule    vitamin B-12 (CYANOCOBALAMIN) 1000 MCG tablet Take 1,000 mcg by mouth daily.   No facility-administered encounter medications on file as of 06/12/2022.    Allergies (verified) Clonidine derivatives and Clonidine   History: Past  Medical History:  Diagnosis Date   Arthritis    Coronary artery disease    Depression    Hyperlipidemia    Skin cancer    left forearm treated years ago by Dr. Golden Pop   Past Surgical History:  Procedure Laterality Date   ANKLE FRACTURE SURGERY Right    ARTHRODESIS ANT INTERBODY INC DISCECTOMY, CERVICAL BELOW C2    05/2019   CARDIAC CATHETERIZATION N/A 04/26/2015   Procedure: Left Heart Cath;  Surgeon: Dionisio David, MD;  Location: [facility];  Service: Cardiovascular;  Laterality: N/A;   CLAVICLE SURGERY Right 05/04/2021   CORONARY ANGIOPLASTY     CORONARY STENT INTERVENTION N/A 03/27/2018   Procedure: CORONARY STENT INTERVENTION;  Surgeon: Yolonda Kida, MD;  Location: [place];  Service: Cardiovascular;  Laterality: N/A;   EYE SURGERY Bilateral    cataract   LEFT HEART CATH AND CORONARY ANGIOGRAPHY Left 03/27/2018   Procedure: Left Heart Cath with possible coronary intervention;  Surgeon: Dionisio David, MD;  Location: [facility];  Service: Cardiovascular;  Laterality: Left;   LUMBAR FUSION  05/08/2022   TOTAL KNEE ARTHROPLASTY Right 10/08/2018   Procedure: TOTAL KNEE ARTHROPLASTY;  Surgeon: Lovell [name], MD;  Location: [facility];  Service: Orthopedics;  Laterality: Right;   Family History  Problem Relation Age of Onset   Diabetes Mother    Hypertension Mother    Cancer Father    Depression Father    Anxiety disorder Father    Depression Daughter    Social History   Socioeconomic History   Marital status: Married    Spouse name: ann   Number of children: 2   Years of education: Not on file   Highest education level: Master's degree (e.g., MA, MS, MEng, MEd, MSW, MBA)  Occupational History    Comment: retired  Tobacco Use   Smoking status: Former    Types: Cigars    Quit date: 07/18/2006    Years since quitting: 15.9   Smokeless tobacco: Never  Vaping Use   Vaping Use: Never used  Substance and Sexual Activity    Alcohol use: No   Drug use: No   Sexual activity: Not Currently  Other Topics Concern   Not on file  Social History Narrative   Not on file   Social Determinants of Health   Financial Resource Strain: Low Risk  (06/12/2022)   Overall Financial Resource Strain (CARDIA)    Difficulty of Paying Living Expenses: Not hard at all  Food Insecurity: No Food Insecurity (06/12/2022)   Hunger Vital Sign    Worried About Running Out of Food in the Last Year: Never true    Ran Out of Food in the Last Year: Never true  Transportation Needs: No Transportation Needs (  06/12/2022)   PRAPARE - Hydrologist (Medical): No    Lack of Transportation (Non-Medical): No  Physical Activity: Insufficiently Active (06/12/2022)   Exercise Vital Sign    Days of Exercise per Week: 3 days    Minutes of Exercise per Session: 30 min  Stress: No Stress Concern Present (06/12/2022)   [place]    Feeling of Stress : Not at all  Social Connections: Moderately Integrated (06/12/2022)   Social Connection and Isolation Panel [NHANES]    Frequency of Communication with Friends and Family: Twice a week    Frequency of Social Gatherings with Friends and Family: More than three times a week    Attends Religious Services: 1 to 4 times per year    Active Member of [employer] or Organizations: No    Attends Music therapist: Never    Marital Status: Married    Tobacco Counseling Counseling given: Not Answered   Clinical Intake:  Pre-visit preparation completed: Yes  Pain : No/denies pain     Diabetes: No  How often do you need to have someone help you when you read instructions, pamphlets, or other written materials from your doctor or pharmacy?: 1 - Never  Diabetic?  no  Interpreter Needed?: No  Information entered by :: Leroy Kennedy LPN   Activities of Daily Living    06/12/2022    9:45 AM 08/31/2021     9:11 AM  In your present state of health, do you have any difficulty performing the following activities:  Hearing? 0 0  Vision? 0 0  Difficulty concentrating or making decisions? 0 0  Walking or climbing stairs? 0 0  Dressing or bathing? 0 0  Doing errands, shopping? 0 0  Preparing Food and eating ? N   Using the Toilet? N   In the past six months, have you accidently leaked urine? N   Do you have problems with loss of bowel control? N   Managing your Medications? N   Managing your Finances? N   Housekeeping or managing your Housekeeping? N     Patient Care Team: [name], DO as PCP - General (Family Medicine) Brendolyn Patty, MD (Dermatology) Earnestine Leys, MD (Specialist) Dionisio David, MD as Consulting Physician (Cardiology) Troxler, Adele Schilder (Inactive) as Attending Physician (Podiatry) Lloyd Huger, MD as Consulting Physician (Oncology)  Indicate any recent Medical Services you may have received from other than [facility] providers in the past year (date may be approximate).     Assessment:   This is a routine wellness examination for Kenneth Pruitt.  Hearing/Vision screen Hearing Screening - Comments:: No trouble hearing Vision Screening - Comments:: Not up to date Cataract surgery Going to change  Dietary issues and exercise activities discussed: Current Exercise Habits: Home exercise routine, Type of exercise: walking;strength training/weights, Time (Minutes): 30, Frequency (Times/Week): 3, Weekly Exercise (Minutes/Week): 90, Intensity: Mild   Goals Addressed             This Visit's Progress    DIET - INCREASE WATER INTAKE   On track    Recommend drinking at least 6-8 glasses of water a day      Patient Stated       Get health so can ride bike 500 miles next year       Depression Screen    06/12/2022    9:44 AM 06/11/2022    9:34 AM 05/01/2022  9:09 AM 04/09/2022    9:19 AM 03/01/2022    8:21 AM 02/06/2022   10:09 AM 12/12/2021    9:12 AM   PHQ 2/9 Scores  PHQ - 2 Score 0 '2 2  2  '$ 0  PHQ- 9 Score 0 '8 9  5       '$ Information is confidential and restricted. Go to Review Flowsheets to unlock data.    Fall Risk    06/12/2022    9:36 AM 03/01/2022    8:21 AM 12/12/2021    9:12 AM 06/06/2021    5:27 PM 08/15/2020    9:58 AM  Fall Risk   Falls in the past year? 1 1 0 1 1  Number falls in past yr: 0 1  0 1  Injury with Fall? 0 1  1 0  Risk for fall due to :  History of fall(s)  Impaired balance/gait   Follow up Falls evaluation completed;Education provided;Falls prevention discussed Falls evaluation completed  Falls evaluation completed     FALL RISK PREVENTION PERTAINING TO THE HOME:  Any stairs in or around the home? Yes  If so, are there any without handrails? No  Home free of loose throw rugs in walkways, pet beds, electrical cords, etc? Yes  Adequate lighting in your home to reduce risk of falls? Yes   ASSISTIVE DEVICES UTILIZED TO PREVENT FALLS:  Life alert? No  Use of a cane, walker or w/c? No  Grab bars in the bathroom? Yes  Shower chair or bench in shower? Yes  Elevated toilet seat or a handicapped toilet? Yes   TIMED UP AND GO:  Was the test performed? No .    Cognitive Function:        06/12/2022    9:37 AM 06/06/2021    5:29 PM 10/11/2017    8:31 AM  6CIT Screen  What Year? 0 points 0 points 0 points  What month? 0 points 0 points 0 points  What time? 0 points 0 points 0 points  Count back from 20 0 points 0 points 0 points  Months in reverse 0 points 0 points 0 points  Repeat phrase 0 points 0 points 0 points  Total Score 0 points 0 points 0 points    Immunizations Immunization History  Administered Date(s) Administered   Fluad Quad(high Dose 65+) 06/11/2022   Influenza, High Dose Seasonal PF 07/18/2017, 07/13/2019   Influenza,inj,Quad PF,6+ Mos 09/13/2015   Influenza-Unspecified 07/13/2016, 07/28/2018, 06/17/2021   Moderna Sars-Covid-2 Vaccination 10/22/2019, 11/20/2019, 07/10/2020,  06/23/2021   Pneumococcal Conjugate-13 10/11/2017   Pneumococcal Polysaccharide-23 11/26/2018   Tdap 07/30/2011, 04/22/2021   Zoster Recombinat (Shingrix) 02/24/2020   Zoster, Live 07/30/2011    TDAP status: Up to date  Flu Vaccine status: Up to date  Pneumococcal vaccine status: Up to date  Covid-19 vaccine status: Information provided on how to obtain vaccines.   Qualifies for Shingles Vaccine? Yes   Zostavax completed Yes   Shingrix Completed?: No.    Education has been provided regarding the importance of this vaccine. Patient has been advised to call insurance company to determine out of pocket expense if they have not yet received this vaccine. Advised may also receive vaccine at local pharmacy or Health Dept. Verbalized acceptance and understanding.  Screening Tests Health Maintenance  Topic Date Due   Zoster Vaccines- Shingrix (2 of 2) 04/20/2020   COVID-19 Vaccine (5 - Moderna risk series) 06/28/2022 (Originally 08/18/2021)   Fecal DNA (Cologuard)  02/25/2023   TETANUS/TDAP  04/23/2031   Pneumonia Vaccine 68+ Years old  Completed   INFLUENZA VACCINE  Completed   Hepatitis C Screening  Completed   HPV VACCINES  Aged Out   COLONOSCOPY (Pts 45-16yr Insurance coverage will need to be confirmed)  Discontinued    Health Maintenance  Health Maintenance Due  Topic Date Due   Zoster Vaccines- Shingrix (2 of 2) 04/20/2020    Colorectal cancer screening: Type of screening: Cologuard. Completed 2021. Repeat every 3 years  Lung Cancer Screening: (Low Dose CT Chest recommended if Age 71-80years, 30 pack-year currently smoking OR have quit w/in 15years.) does not qualify.   Lung Cancer Screening Referral:   Additional Screening:  Hepatitis C Screening: does not qualify; Completed 2017  Vision Screening: Recommended annual ophthalmology exams for early detection of glaucoma and other disorders of the eye. Is the patient up to date with their annual eye exam?  no Who is  the provider or what is the name of the office in which the patient attends annual eye exams?  If pt is not established with a provider, would they like to be referred to a provider to establish care? No .   Dental Screening: Recommended annual dental exams for proper oral hygiene  Community Resource Referral / Chronic Care Management: CRR required this visit?  No   CCM required this visit?  No      Plan:     I have personally reviewed and noted the following in the patient's chart:   Medical and social history Use of alcohol, tobacco or illicit drugs  Current medications and supplements including opioid prescriptions. Patient is not currently taking opioid prescriptions. Functional ability and status Nutritional status Physical activity Advanced directives List of other physicians Hospitalizations, surgeries, and ER visits in previous 12 months Vitals Screenings to include cognitive, depression, and falls Referrals and appointments  In addition, I have reviewed and discussed with patient certain preventive protocols, quality metrics, and best practice recommendations. A written personalized care plan for preventive services as well as general preventive health recommendations were provided to patient.     JLeroy Kennedy LPN   [id number]  Nurse Notes:

## 2022-06-12 NOTE — Patient Instructions (Signed)
[name] , Thank you for taking time to come for your Medicare Wellness Visit. I appreciate your ongoing commitment to your health goals. Please review the following plan we discussed and let me know if I can assist you in the future.   Screening recommendations/referrals: Colonoscopy: up to date Recommended yearly ophthalmology/optometry visit for glaucoma screening and checkup Recommended yearly dental visit for hygiene and checkup  Vaccinations: Influenza vaccine: up to date Pneumococcal vaccine: up to date Tdap vaccine: up to date Shingles vaccine: 1 of 2    Advanced directives: Education provided  Conditions/risks identified:   Next appointment: 11-12-2021 @ 9:20  Preventive Care 71 Years and Older, Male Preventive care refers to lifestyle choices and visits with your health care provider that can promote health and wellness. What does preventive care include? A yearly physical exam. This is also called an annual well check. Dental exams once or twice a year. Routine eye exams. Ask your health care provider how often you should have your eyes checked. Personal lifestyle choices, including: Daily care of your teeth and gums. Regular physical activity. Eating a healthy diet. Avoiding tobacco and drug use. Limiting alcohol use. Practicing safe sex. Taking low doses of aspirin every day. Taking vitamin and mineral supplements as recommended by your health care provider. What happens during an annual well check? The services and screenings done by your health care provider during your annual well check will depend on your age, overall health, lifestyle risk factors, and family history of disease. Counseling  Your health care provider may ask you questions about your: Alcohol use. Tobacco use. Drug use. Emotional well-being. Home and relationship well-being. Sexual activity. Eating habits. History of falls. Memory and ability to understand (cognition). Work and work  Statistician. Screening  You may have the following tests or measurements: Height, weight, and BMI. Blood pressure. Lipid and cholesterol levels. These may be checked every 5 years, or more frequently if you are over 55 years old. Skin check. Lung cancer screening. You may have this screening every year starting at age 48 if you have a 30-pack-year history of smoking and currently smoke or have quit within the past 15 years. Fecal occult blood test (FOBT) of the stool. You may have this test every year starting at age 64. Flexible sigmoidoscopy or colonoscopy. You may have a sigmoidoscopy every 5 years or a colonoscopy every 10 years starting at age 39. Prostate cancer screening. Recommendations will vary depending on your family history and other risks. Hepatitis C blood test. Hepatitis B blood test. Sexually transmitted disease (STD) testing. Diabetes screening. This is done by checking your blood sugar (glucose) after you have not eaten for a while (fasting). You may have this done every 1-3 years. Abdominal aortic aneurysm (AAA) screening. You may need this if you are a current or former smoker. Osteoporosis. You may be screened starting at age 77 if you are at high risk. Talk with your health care provider about your test results, treatment options, and if necessary, the need for more tests. Vaccines  Your health care provider may recommend certain vaccines, such as: Influenza vaccine. This is recommended every year. Tetanus, diphtheria, and acellular pertussis (Tdap, Td) vaccine. You may need a Td booster every 10 years. Zoster vaccine. You may need this after age 18. Pneumococcal 13-valent conjugate (PCV13) vaccine. One dose is recommended after age 54. Pneumococcal polysaccharide (PPSV23) vaccine. One dose is recommended after age 70. Talk to your health care provider about which screenings and vaccines  you need and how often you need them. This information is not intended to replace  advice given to you by your health care provider. Make sure you discuss any questions you have with your health care provider. Document Released: 09/30/2015 Document Revised: 05/23/2016 Document Reviewed: 07/05/2015 Elsevier Interactive Patient Education  2017 [employer] Prevention in the Home Falls can cause injuries. They can happen to people of all ages. There are many things you can do to make your home safe and to help prevent falls. What can I do on the outside of my home? Regularly fix the edges of walkways and driveways and fix any cracks. Remove anything that might make you trip as you walk through a door, such as a raised step or threshold. Trim any bushes or trees on the path to your home. Use bright outdoor lighting. Clear any walking paths of anything that might make someone trip, such as rocks or tools. Regularly check to see if handrails are loose or broken. Make sure that both sides of any steps have handrails. Any raised decks and porches should have guardrails on the edges. Have any leaves, snow, or ice cleared regularly. Use sand or salt on walking paths during winter. Clean up any spills in your garage right away. This includes oil or grease spills. What can I do in the bathroom? Use night lights. Install grab bars by the toilet and in the tub and shower. Do not use towel bars as grab bars. Use non-skid mats or decals in the tub or shower. If you need to sit down in the shower, use a plastic, non-slip stool. Keep the floor dry. Clean up any water that spills on the floor as soon as it happens. Remove soap buildup in the tub or shower regularly. Attach bath mats securely with double-sided non-slip rug tape. Do not have throw rugs and other things on the floor that can make you trip. What can I do in the bedroom? Use night lights. Make sure that you have a light by your bed that is easy to reach. Do not use any sheets or blankets that are too big for your bed.  They should not hang down onto the floor. Have a firm chair that has side arms. You can use this for support while you get dressed. Do not have throw rugs and other things on the floor that can make you trip. What can I do in the kitchen? Clean up any spills right away. Avoid walking on wet floors. Keep items that you use a lot in easy-to-reach places. If you need to reach something above you, use a strong step stool that has a grab bar. Keep electrical cords out of the way. Do not use floor polish or wax that makes floors slippery. If you must use wax, use non-skid floor wax. Do not have throw rugs and other things on the floor that can make you trip. What can I do with my stairs? Do not leave any items on the stairs. Make sure that there are handrails on both sides of the stairs and use them. Fix handrails that are broken or loose. Make sure that handrails are as long as the stairways. Check any carpeting to make sure that it is firmly attached to the stairs. Fix any carpet that is loose or worn. Avoid having throw rugs at the top or bottom of the stairs. If you do have throw rugs, attach them to the floor with carpet tape. Make sure  that you have a light switch at the top of the stairs and the bottom of the stairs. If you do not have them, ask someone to add them for you. What else can I do to help prevent falls? Wear shoes that: Do not have high heels. Have rubber bottoms. Are comfortable and fit you well. Are closed at the toe. Do not wear sandals. If you use a stepladder: Make sure that it is fully opened. Do not climb a closed stepladder. Make sure that both sides of the stepladder are locked into place. Ask someone to hold it for you, if possible. Clearly mark and make sure that you can see: Any grab bars or handrails. First and last steps. Where the edge of each step is. Use tools that help you move around (mobility aids) if they are needed. These  include: Canes. Walkers. Scooters. Crutches. Turn on the lights when you go into a dark area. Replace any light bulbs as soon as they burn out. Set up your furniture so you have a clear path. Avoid moving your furniture around. If any of your floors are uneven, fix them. If there are any pets around you, be aware of where they are. Review your medicines with your doctor. Some medicines can make you feel dizzy. This can increase your chance of falling. Ask your doctor what other things that you can do to help prevent falls. This information is not intended to replace advice given to you by your health care provider. Make sure you discuss any questions you have with your health care provider. Document Released: 06/30/2009 Document Revised: 02/09/2016 Document Reviewed: 10/08/2014 Elsevier Interactive Patient Education  2017 [employer].

## 2022-07-03 ENCOUNTER — Other Ambulatory Visit: Payer: Self-pay | Admitting: Family Medicine

## 2022-07-03 DIAGNOSIS — N401 Enlarged prostate with lower urinary tract symptoms: Secondary | ICD-10-CM

## 2022-07-03 NOTE — Telephone Encounter (Signed)
Requested Prescriptions  Pending Prescriptions Disp Refills  . dutasteride (AVODART) 0.5 MG capsule [Pharmacy Med Name: DUTASTERIDE 0.5 MG CAP] 90 capsule 1    Sig: TAKE 1 CAPSULE BY MOUTH EVERY DAY IN [place]     Urology: 5-alpha Reductase Inhibitors Passed - 07/03/2022 11:50 AM      Passed - PSA in normal range and within 360 days    Prostate Specific Ag, Serum  Date Value Ref Range Status  06/05/2022 2.7 0.0 - 4.0 ng/mL Final    Comment:    Roche ECLIA methodology. According to the American Urological Association, Serum PSA should decrease and remain at undetectable levels after radical prostatectomy. The AUA defines biochemical recurrence as an initial PSA value 0.2 ng/mL or greater followed by a subsequent confirmatory PSA value 0.2 ng/mL or greater. Values obtained with different assay methods or kits cannot be used interchangeably. Results cannot be interpreted as absolute evidence of the presence or absence of malignant disease.          Passed - Valid encounter within last 12 months    Recent Outpatient Visits          3 weeks ago Essential hypertension   [place], Andrews, DO   2 months ago Pre-op evaluation   [place], Selden, DO   4 months ago Essential hypertension   [place], [name], DO   10 months ago Routine general medical examination at a health care facility   [place], Bazine, DO   1 year ago Abrasion of right knee, initial encounter   [facility] [name], DO      Future Appointments            In 1 month [name], [name], DO [facility] [place]   In 2 months [name], MD [place]   In 4 months Johnson, [name], DO [place], [place]   In 11 months [name], [name], MD [place]

## 2022-07-09 ENCOUNTER — Other Ambulatory Visit: Payer: Self-pay | Admitting: Psychiatry

## 2022-07-09 DIAGNOSIS — F3342 Major depressive disorder, recurrent, in full remission: Secondary | ICD-10-CM

## 2022-07-16 ENCOUNTER — Encounter: Payer: Self-pay | Admitting: Psychiatry

## 2022-07-16 ENCOUNTER — Ambulatory Visit (INDEPENDENT_AMBULATORY_CARE_PROVIDER_SITE_OTHER): Payer: Medicare PPO | Admitting: Psychiatry

## 2022-07-16 VITALS — BP 131/70 | HR 80 | Temp 98.3°F | Ht 73.0 in | Wt 210.4 lb

## 2022-07-16 DIAGNOSIS — F411 Generalized anxiety disorder: Secondary | ICD-10-CM | POA: Diagnosis not present

## 2022-07-16 DIAGNOSIS — F3342 Major depressive disorder, recurrent, in full remission: Secondary | ICD-10-CM

## 2022-07-16 DIAGNOSIS — G4701 Insomnia due to medical condition: Secondary | ICD-10-CM | POA: Diagnosis not present

## 2022-07-16 DIAGNOSIS — S42009A Fracture of unspecified part of unspecified clavicle, initial encounter for closed fracture: Secondary | ICD-10-CM | POA: Insufficient documentation

## 2022-07-16 DIAGNOSIS — Z9889 Other specified postprocedural states: Secondary | ICD-10-CM | POA: Insufficient documentation

## 2022-07-16 DIAGNOSIS — N9989 Other postprocedural complications and disorders of genitourinary system: Secondary | ICD-10-CM | POA: Insufficient documentation

## 2022-07-16 NOTE — Patient Instructions (Addendum)
   MR. ROBERT MILAN [email or web address] [address] [place], [place] [address]   [phone]

## 2022-07-16 NOTE — Progress Notes (Unsigned)
[place] MD OP Progress Note  07/16/2022 10:07 AM Kenneth Pruitt  MRN:  [id number]  Chief Complaint:  Chief Complaint  Patient presents with   Follow-up   Depression   Anxiety   Medication Refill   HPI: Kenneth Pruitt is a 71 year old Caucasian male, retired, married, lives in [place], has a history of MDD, GAD, insomnia, coronary artery disease, stent placement, knee pain, testosterone deficiency was evaluated in office today.  Patient today reports he is currently recovering from his surgical pain.  Patient with history of chronic back pain, knee pain.  Reports he has started exercising-mostly walks at this time.  He has to wait another 6 months before he can start riding his bike again.  Reports currently managing his pain better than before.  Was taking hydrocodone however did not stay on it for too long.  Patient reports he did struggle with appetite changes, had reduced appetite while he was recovering from his surgery and he was on opioid medication.  He did lose a few pounds at that time.  However he has started eating better and has started gaining the weight back.  Patient is currently compliant on the Cymbalta, Wellbutrin.  Denies any significant sadness, low motivation, anhedonia or anxiety.  Reports sleep is good.  Does have good support system from his family.  Denies any suicidality, homicidality or perceptual disturbances.  Interested in finding a psychotherapist, will provide community resources.  Denies any other concerns today.  Visit Diagnosis:    ICD-10-CM   1. MDD (major depressive disorder), recurrent, in full remission ([place])  F33.42     2. GAD (generalized anxiety disorder)  F41.1     3. Insomnia due to medical condition  G47.01    depression, pain      Past Psychiatric History: Reviewed past psychiatric history from progress note on 12/16/2017.  Past Medical History:  Past Medical History:  Diagnosis Date   Arthritis    Coronary artery disease     Depression    Hyperlipidemia    Skin cancer    left forearm treated years ago by Dr. Golden Pop    Past Surgical History:  Procedure Laterality Date   ANKLE FRACTURE SURGERY Right    ARTHRODESIS ANT INTERBODY INC DISCECTOMY, CERVICAL BELOW C2    05/2019   CARDIAC CATHETERIZATION N/A 04/26/2015   Procedure: Left Heart Cath;  Surgeon: Dionisio David, MD;  Location: [facility];  Service: Cardiovascular;  Laterality: N/A;   CLAVICLE SURGERY Right 05/04/2021   CORONARY ANGIOPLASTY     CORONARY STENT INTERVENTION N/A 03/27/2018   Procedure: CORONARY STENT INTERVENTION;  Surgeon: Yolonda Kida, MD;  Location: [facility];  Service: Cardiovascular;  Laterality: N/A;   EYE SURGERY Bilateral    cataract   LEFT HEART CATH AND CORONARY ANGIOGRAPHY Left 03/27/2018   Procedure: Left Heart Cath with possible coronary intervention;  Surgeon: Dionisio David, MD;  Location: [facility];  Service: Cardiovascular;  Laterality: Left;   LUMBAR FUSION  05/08/2022   TOTAL KNEE ARTHROPLASTY Right 10/08/2018   Procedure: TOTAL KNEE ARTHROPLASTY;  Surgeon: Lovell [name], MD;  Location: [facility];  Service: Orthopedics;  Laterality: Right;    Family Psychiatric History: Reviewed family psychiatric history from progress note on 12/16/2017.  Family History:  Family History  Problem Relation Age of Onset   Diabetes Mother    Hypertension Mother    Cancer Father    Depression Father    Anxiety  disorder Father    Depression Daughter     Social History: Reviewed social history from progress note on 12/16/2017. Social History   Socioeconomic History   Marital status: Married    Spouse name: ann   Number of children: 2   Years of education: Not on file   Highest education level: Master's degree (e.g., MA, MS, MEng, MEd, MSW, MBA)  Occupational History    Comment: retired  Tobacco Use   Smoking status: Former    Types: Cigars    Quit date: 07/18/2006    Years since  quitting: 16.0   Smokeless tobacco: Never  Vaping Use   Vaping Use: Never used  Substance and Sexual Activity   Alcohol use: No   Drug use: No   Sexual activity: Not Currently  Other Topics Concern   Not on file  Social History Narrative   Not on file   Social Determinants of Health   Financial Resource Strain: Low Risk  (06/12/2022)   Overall Financial Resource Strain (CARDIA)    Difficulty of Paying Living Expenses: Not hard at all  Food Insecurity: No Food Insecurity (06/12/2022)   Hunger Vital Sign    Worried About Running Out of Food in the Last Year: Never true    Ran Out of Food in the Last Year: Never true  Transportation Needs: No Transportation Needs (06/12/2022)   PRAPARE - Hydrologist (Medical): No    Lack of Transportation (Non-Medical): No  Physical Activity: Insufficiently Active (06/12/2022)   Exercise Vital Sign    Days of Exercise per Week: 3 days    Minutes of Exercise per Session: 30 min  Stress: No Stress Concern Present (06/12/2022)   [place]    Feeling of Stress : Not at all  Social Connections: Moderately Integrated (06/12/2022)   Social Connection and Isolation Panel [NHANES]    Frequency of Communication with Friends and Family: Twice a week    Frequency of Social Gatherings with Friends and Family: More than three times a week    Attends Religious Services: 1 to 4 times per year    Active Member of [employer] or Organizations: No    Attends Archivist Meetings: Never    Marital Status: Married    Allergies:  Allergies  Allergen Reactions   Clonidine Derivatives Shortness Of Breath and Other (See Comments)    Low heart rate   Clonidine     Mild bradycardia + Somnolence    Metabolic Disorder Labs: Lab Results  Component Value Date   HGBA1C 6.1 (H) 05/01/2022   No results found for: "PROLACTIN" Lab Results  Component Value Date   CHOL 94  (L) 03/01/2022   TRIG 126 03/01/2022   HDL 43 03/01/2022   CHOLHDL 2.5 08/20/2019   VLDL 37 03/28/2018   LDLCALC 29 03/01/2022   LDLCALC 33 08/31/2021   Lab Results  Component Value Date   TSH 0.939 08/31/2021   TSH 1.020 02/21/2021    Therapeutic Level Labs: No results found for: "LITHIUM" No results found for: "VALPROATE" No results found for: "CBMZ"  Current Medications: Current Outpatient Medications  Medication Sig Dispense Refill   acetaminophen (TYLENOL) 500 MG tablet Take by mouth.     amoxicillin (AMOXIL) 500 MG capsule Take 1,000 mg by mouth 2 (two) times daily.     aspirin 81 MG EC tablet Take 1 tablet (81 mg total) by mouth daily. 90 tablet 3  buPROPion (WELLBUTRIN XL) 150 MG 24 hr tablet Take 1 tablet (150 mg total) by mouth daily with breakfast. 90 tablet 1   chlorthalidone (HYGROTON) 25 MG tablet Take 25 mg by mouth daily.     Cholecalciferol 125 MCG (5000 UT) capsule Take 5,000 Units by mouth daily.     clopidogrel (PLAVIX) 75 MG tablet Take 75 mg by mouth daily.     clotrimazole-betamethasone (LOTRISONE) cream Apply 1 application topically 2 (two) times daily. 30 g 0   DULoxetine (CYMBALTA) 20 MG capsule Take 20 mg daily along with 60 mg - total of 80 mg . 90 capsule 1   DULoxetine (CYMBALTA) 60 MG capsule Take 1 capsule (60 mg total) by mouth daily. Take along with 20 mg - total of 80 mg 90 capsule 1   dutasteride (AVODART) 0.5 MG capsule TAKE 1 CAPSULE BY MOUTH EVERY DAY IN THEEVENING 90 capsule 0   fluticasone (FLONASE) 50 MCG/ACT nasal spray PLACE 2 SPRAYS INTO BOTH NOSTRILS DAILY 16 g 12   hydrALAZINE (APRESOLINE) 50 MG tablet Take 25 mg by mouth 2 (two) times daily.     hydrocortisone (ANUSOL-HC) 2.5 % rectal cream APPLY RECTALLY TWICE DAILY AS DIRECTED 30 g 0   hydrOXYzine (ATARAX) 25 MG tablet Take 25 mg by mouth every 8 (eight) hours as needed.     losartan (COZAAR) 50 MG tablet Take 1 tablet (50 mg total) by mouth daily. 90 tablet 1   niacin (NIASPAN)  1000 MG CR tablet Take 2 tablets (2,000 mg total) by mouth at bedtime. 180 tablet 2   pantoprazole (PROTONIX) 20 MG tablet Take 20 mg by mouth daily.      rosuvastatin (CRESTOR) 40 MG tablet Take 1 tablet (40 mg total) by mouth every evening. 90 tablet 1   tamsulosin (FLOMAX) 0.4 MG CAPS capsule Take 1 capsule (0.4 mg total) by mouth daily. 90 capsule 1   testosterone cypionate (DEPOTESTOSTERONE CYPIONATE) 200 MG/ML injection INJECT 0.75 ML EVERY 10 DAYS AS DIRECTED 10 mL 0   VASCEPA 1 g capsule      vitamin B-12 (CYANOCOBALAMIN) 1000 MCG tablet Take 1,000 mcg by mouth daily.     diclofenac Sodium (VOLTAREN) 1 % GEL Voltaren 1 % topical gel  APPLY 2 GRAMS TO THE AFFECTED AREA(S) BY TOPICAL ROUTE 4 TIMES PER DAY (Patient not taking: Reported on 07/16/2022)     meloxicam (MOBIC) 15 MG tablet Take 1 tablet (15 mg total) by mouth daily. (Patient not taking: Reported on 07/16/2022) 90 tablet 1   mirabegron ER (MYRBETRIQ) 50 MG TB24 tablet Take 1 tablet (50 mg total) by mouth daily. (Patient not taking: Reported on 07/16/2022) 30 tablet 0   mupirocin ointment (BACTROBAN) 2 % APPLY 1 APPLICATION TOPICALLY 2 TIMES DAILY (Patient not taking: Reported on 07/16/2022) 22 g 0   No current facility-administered medications for this visit.     Musculoskeletal: Strength & Muscle Tone: within normal limits Gait & Station:  slow Patient leans: N/A  Psychiatric Specialty Exam: Review of Systems  Musculoskeletal:  Positive for arthralgias and back pain.       BL knee pain- chronic  Psychiatric/Behavioral: Negative.    All other systems reviewed and are negative.   Blood pressure 131/70, pulse 80, temperature 98.3 F (36.8 C), temperature source Oral, height '6\' 1"'$  (1.854 m), weight 210 lb 6.4 oz (95.4 kg).Body mass index is 27.76 kg/m.  General Appearance: Casual  Eye Contact:  Fair  Speech:  Clear and Coherent  Volume:  Normal  Mood:  Euthymic  Affect:  Congruent  Thought Process:  Goal Directed  and Descriptions of Associations: Intact  Orientation:  Full (Time, Place, and Person)  Thought Content: Logical   Suicidal Thoughts:  No  Homicidal Thoughts:  No  Memory:  Immediate;   Fair Recent;   Fair Remote;   Fair  Judgement:  Fair  Insight:  Fair  Psychomotor Activity:  Normal  Concentration:  Concentration: Fair and Attention Span: Fair  Recall:  [employer] of Knowledge: Fair  Language: Fair  Akathisia:  No  Handed:  Right  AIMS (if indicated): not done  Assets:  Communication Skills Desire for Improvement Housing Intimacy Social Support  ADL's:  Intact  Cognition: WNL  Sleep:  Fair   Screenings: [place] Office Visit from 04/09/2022 in [facility] Office Visit from 02/06/2022 in [facility] Total Score 0 0      GAD-7    [place] Visit from 07/16/2022 in [place] Office Visit from 06/11/2022 in [place] Visit from 05/01/2022 in [facility] Visit from 04/09/2022 in [facility] [place] Office Visit from 03/01/2022 in [place]  Total GAD-7 Score '3 7 7 4 2      '$ PHQ2-9    [place] Office Visit from 07/16/2022 in [place] from 06/12/2022 in [place] Visit from 06/11/2022 in [place] Visit from 05/01/2022 in [place] Visit from 04/09/2022 in [place]  PHQ-2 Total Score 2 0 '2 2 1  '$ PHQ-9 Total Score 5 0 8 9 --      [place] Office Visit from 07/16/2022 in [place] Office Visit from 02/06/2022 in [place] Office Visit from 10/12/2021 in [place] No Risk No Risk No Risk        Assessment and Plan: Kenneth Pruitt is a 71 year old Caucasian male who has a history of MDD, anxiety disorder, multiple medical problems including MDD, GAD, hyperlipidemia, BPH, coronary artery disease status post stent placement, chronic pain was evaluated in office today.  Patient is currently stable on current medication regimen.  Plan MDD in remission Cymbalta 80 mg p.o. daily Wellbutrin XL 150 mg p.o. daily  GAD-stable Cymbalta 80 mg p.o. daily   Insomnia-stable Continue pain management  Provided resources for psychotherapist in the community-Mr. Delphi.  Follow-up in clinic in 4 to 5 months or sooner if needed.   This note was generated in part or whole with voice recognition software. Voice recognition is usually quite accurate but there are transcription errors that can and very often do occur. I apologize for any typographical errors that were not detected and corrected.     Ursula Alert, MD 07/17/2022, 8:23 AM

## 2022-08-06 ENCOUNTER — Ambulatory Visit: Payer: Medicare PPO | Admitting: Family Medicine

## 2022-08-06 ENCOUNTER — Encounter: Payer: Self-pay | Admitting: Family Medicine

## 2022-08-06 VITALS — BP 120/67 | HR 89 | Temp 98.6°F | Ht 73.0 in | Wt 214.1 lb

## 2022-08-06 DIAGNOSIS — I2510 Atherosclerotic heart disease of native coronary artery without angina pectoris: Secondary | ICD-10-CM | POA: Diagnosis not present

## 2022-08-06 DIAGNOSIS — I351 Nonrheumatic aortic (valve) insufficiency: Secondary | ICD-10-CM | POA: Diagnosis not present

## 2022-08-06 DIAGNOSIS — Z01818 Encounter for other preprocedural examination: Secondary | ICD-10-CM

## 2022-08-06 DIAGNOSIS — E785 Hyperlipidemia, unspecified: Secondary | ICD-10-CM | POA: Diagnosis not present

## 2022-08-06 DIAGNOSIS — I340 Nonrheumatic mitral (valve) insufficiency: Secondary | ICD-10-CM | POA: Diagnosis not present

## 2022-08-06 DIAGNOSIS — R0602 Shortness of breath: Secondary | ICD-10-CM | POA: Diagnosis not present

## 2022-08-06 DIAGNOSIS — I10 Essential (primary) hypertension: Secondary | ICD-10-CM | POA: Diagnosis not present

## 2022-08-06 LAB — COAGUCHEK XS/INR WAIVED
INR: 1 (ref 0.9–1.1)
Prothrombin Time: 11.8 s

## 2022-08-06 NOTE — Progress Notes (Signed)
BP 120/67   Pulse 89   Temp 98.6 F (37 C) (Oral)   Ht '6\' 1"'$  (1.854 m)   Wt 214 lb 1.6 oz (97.1 kg)   SpO2 95%   BMI 28.25 kg/m    Subjective:    Patient ID: Kenneth Pruitt, male    DOB: 10-Mar-1951, 71 y.o.   MRN: [id number]  HPI: Kenneth Pruitt is a 71 y.o. male  Chief Complaint  Patient presents with   Medical Clearance   Here today for medical clearance. To have his L foot fusion done 08/22/22 with [facility]. He saw cardiology this morning and had his got his clearance there. He has never had any problems with anesthesia. Always went home when he was supposed to. No post-op N/V. No family history of problems with anesthesia. No family history of malignant hyperthermia. His last surgery was 05/08/22 and he did well with his surgery. He has been able to exercise. Has been able to walk. Able to walk up several flights of stairs without SOB. No CP. No other concerns or complaints at this time.    Active Ambulatory Problems    Diagnosis Date Noted   Hypogonadism in male 11/17/2015   Hyperlipidemia 11/17/2015   Essential hypertension 11/17/2015   Knee pain, right 08/06/2016   Elevated PSA 08/06/2016   Polycythemia, secondary 08/28/2016   Advanced care planning/counseling discussion 02/05/2017   Skin lesions 10/16/2017   BPH (benign prostatic hyperplasia) 10/16/2017   Arthritis of knee 12/18/2011   Erectile dysfunction 03/07/2015   Primary localized osteoarthrosis, lower leg 11/21/2011   Unstable angina ([place]) 03/24/2018   CAD (coronary artery disease) 03/27/2018   Hypokalemia 09/30/2018   S/P TKR (total knee replacement) using cement, right 10/08/2018   GAD (generalized anxiety disorder) 08/05/2019   Insomnia due to mental disorder 08/05/2019   Weakness of right leg 05/10/2019   Weakness of right arm 05/10/2019   OSA on CPAP 05/19/2019   Orthostasis 05/28/2019   Chronic kidney disease (CKD), stage III (moderate) ([place]) 05/19/2019   Allergic rhinitis 05/26/2019   MDD  (major depressive disorder), recurrent, in full remission ([place]) 12/31/2019   Bereavement 12/31/2019   Family history of prostate cancer 06/08/2020   Lumbar radiculopathy 08/31/2021   Clavicle pain 09/26/2021   Insomnia due to medical condition 11/14/2021   S/P cervical spinal fusion 12/12/2021   Neuropathic pain, leg, right 12/12/2021   Contusion of buttock 01/15/2022   History of cardiac catheterization 07/16/2022   Hammer toe 06/04/2022   Fracture of clavicle 07/16/2022   Foot pain 06/04/2022   Acquired hallux rigidus 06/04/2022   Postoperative retention of urine 07/16/2022   Resolved Ambulatory Problems    Diagnosis Date Noted   Coronary artery disease 11/17/2015   Depression 11/17/2015   Elevated hematocrit 08/07/2016   MDD (major depressive disorder), recurrent episode, mild ([place]) 08/05/2019   Elevated glucose 05/19/2019   Coronary artery disease involving native coronary artery of native heart without angina pectoris 11/17/2015   Past Medical History:  Diagnosis Date   Arthritis    Skin cancer    Past Surgical History:  Procedure Laterality Date   ANKLE FRACTURE SURGERY Right    ARTHRODESIS ANT INTERBODY INC DISCECTOMY, CERVICAL BELOW C2    05/2019   CARDIAC CATHETERIZATION N/A 04/26/2015   Procedure: Left Heart Cath;  Surgeon: Dionisio David, MD;  Location: [facility];  Service: Cardiovascular;  Laterality: N/A;   CLAVICLE SURGERY Right 05/04/2021   CORONARY ANGIOPLASTY  CORONARY STENT INTERVENTION N/A 03/27/2018   Procedure: CORONARY STENT INTERVENTION;  Surgeon: Yolonda Kida, MD;  Location: [facility];  Service: Cardiovascular;  Laterality: N/A;   EYE SURGERY Bilateral    cataract   LEFT HEART CATH AND CORONARY ANGIOGRAPHY Left 03/27/2018   Procedure: Left Heart Cath with possible coronary intervention;  Surgeon: Dionisio David, MD;  Location: [facility];  Service: Cardiovascular;  Laterality: Left;   LUMBAR FUSION   05/08/2022   TOTAL KNEE ARTHROPLASTY Right 10/08/2018   Procedure: TOTAL KNEE ARTHROPLASTY;  Surgeon: Lovell [name], MD;  Location: [facility];  Service: Orthopedics;  Laterality: Right;   Outpatient Encounter Medications as of 08/06/2022  Medication Sig Note   acetaminophen (TYLENOL) 500 MG tablet Take by mouth.    aspirin 81 MG EC tablet Take 1 tablet (81 mg total) by mouth daily. 05/01/2022: Stopped for procedure   buPROPion (WELLBUTRIN XL) 150 MG 24 hr tablet Take 1 tablet (150 mg total) by mouth daily with breakfast.    chlorthalidone (HYGROTON) 25 MG tablet Take 25 mg by mouth daily.    Cholecalciferol 125 MCG (5000 UT) capsule Take 5,000 Units by mouth daily.    clopidogrel (PLAVIX) 75 MG tablet Take 75 mg by mouth daily. 05/01/2022: Stopped for procedure   clotrimazole-betamethasone (LOTRISONE) cream Apply 1 application topically 2 (two) times daily.    diclofenac Sodium (VOLTAREN) 1 % GEL     DULoxetine (CYMBALTA) 20 MG capsule Take 20 mg daily along with 60 mg - total of 80 mg .    DULoxetine (CYMBALTA) 60 MG capsule Take 1 capsule (60 mg total) by mouth daily. Take along with 20 mg - total of 80 mg    dutasteride (AVODART) 0.5 MG capsule TAKE 1 CAPSULE BY MOUTH EVERY DAY IN THEEVENING    fluticasone (FLONASE) 50 MCG/ACT nasal spray PLACE 2 SPRAYS INTO BOTH NOSTRILS DAILY    hydrALAZINE (APRESOLINE) 50 MG tablet Take 25 mg by mouth 2 (two) times daily. 05/01/2022: 12.'5mg'$  BID    hydrocortisone (ANUSOL-HC) 2.5 % rectal cream APPLY RECTALLY TWICE DAILY AS DIRECTED    hydrOXYzine (ATARAX) 25 MG tablet Take 25 mg by mouth every 8 (eight) hours as needed.    losartan (COZAAR) 50 MG tablet Take 1 tablet (50 mg total) by mouth daily.    mupirocin ointment (BACTROBAN) 2 % APPLY 1 APPLICATION TOPICALLY 2 TIMES DAILY    niacin (NIASPAN) 1000 MG CR tablet Take 2 tablets (2,000 mg total) by mouth at bedtime.    pantoprazole (PROTONIX) 20 MG tablet Take 20 mg by mouth daily.     rosuvastatin  (CRESTOR) 40 MG tablet Take 1 tablet (40 mg total) by mouth every evening.    tamsulosin (FLOMAX) 0.4 MG CAPS capsule Take 1 capsule (0.4 mg total) by mouth daily.    testosterone cypionate (DEPOTESTOSTERONE CYPIONATE) 200 MG/ML injection INJECT 0.75 ML EVERY 10 DAYS AS DIRECTED    VASCEPA 1 g capsule  02/21/2021: 2 tablets BID    vitamin B-12 (CYANOCOBALAMIN) 1000 MCG tablet Take 1,000 mcg by mouth daily.    amoxicillin (AMOXIL) 500 MG capsule Take 1,000 mg by mouth 2 (two) times daily. (Patient not taking: Reported on 08/06/2022)    meloxicam (MOBIC) 15 MG tablet Take 1 tablet (15 mg total) by mouth daily. (Patient not taking: Reported on 07/16/2022)    mirabegron ER (MYRBETRIQ) 50 MG TB24 tablet Take 1 tablet (50 mg total) by mouth daily. (Patient not taking: Reported on  07/16/2022)    No facility-administered encounter medications on file as of 08/06/2022.   Allergies  Allergen Reactions   Clonidine Derivatives Shortness Of Breath and Other (See Comments)    Low heart rate   Clonidine     Mild bradycardia + Somnolence   Social History   Socioeconomic History   Marital status: Married    Spouse name: ann   Number of children: 2   Years of education: Not on file   Highest education level: Master's degree (e.g., MA, MS, MEng, MEd, MSW, MBA)  Occupational History    Comment: retired  Tobacco Use   Smoking status: Former    Types: Cigars    Quit date: 07/18/2006    Years since quitting: 16.0   Smokeless tobacco: Never  Vaping Use   Vaping Use: Never used  Substance and Sexual Activity   Alcohol use: No   Drug use: No   Sexual activity: Not Currently  Other Topics Concern   Not on file  Social History Narrative   Not on file   Social Determinants of Health   Financial Resource Strain: Low Risk  (06/12/2022)   Overall Financial Resource Strain (CARDIA)    Difficulty of Paying Living Expenses: Not hard at all  Food Insecurity: No Food Insecurity (06/12/2022)   Hunger Vital  Sign    Worried About Running Out of Food in the Last Year: Never true    [place] in the Last Year: Never true  Transportation Needs: No Transportation Needs (06/12/2022)   PRAPARE - Hydrologist (Medical): No    Lack of Transportation (Non-Medical): No  Physical Activity: Insufficiently Active (06/12/2022)   Exercise Vital Sign    Days of Exercise per Week: 3 days    Minutes of Exercise per Session: 30 min  Stress: No Stress Concern Present (06/12/2022)   [place]    Feeling of Stress : Not at all  Social Connections: Moderately Integrated (06/12/2022)   Social Connection and Isolation Panel [NHANES]    Frequency of Communication with Friends and Family: Twice a week    Frequency of Social Gatherings with Friends and Family: More than three times a week    Attends Religious Services: 1 to 4 times per year    Active Member of [employer] or Organizations: No    Attends Archivist Meetings: Never    Marital Status: Married   Family History  Problem Relation Age of Onset   Diabetes Mother    Hypertension Mother    Cancer Father    Depression Father    Anxiety disorder Father    Depression Daughter    Review of Systems  Constitutional: Negative.   Respiratory: Negative.    Cardiovascular: Negative.   Gastrointestinal: Negative.   Musculoskeletal: Negative.   Psychiatric/Behavioral: Negative.      Per HPI unless specifically indicated above     Objective:    BP 120/67   Pulse 89   Temp 98.6 F (37 C) (Oral)   Ht '6\' 1"'$  (1.854 m)   Wt 214 lb 1.6 oz (97.1 kg)   SpO2 95%   BMI 28.25 kg/m   Wt Readings from Last 3 Encounters:  08/06/22 214 lb 1.6 oz (97.1 kg)  06/11/22 204 lb 1.6 oz (92.6 kg)  06/08/22 195 lb (88.5 kg)    Physical Exam Vitals and nursing note reviewed.  Constitutional:      General:  He is not in acute distress.    Appearance: Normal  appearance. He is normal weight. He is not ill-appearing, toxic-appearing or diaphoretic.  HENT:     Head: Normocephalic and atraumatic.     Right Ear: External ear normal.     Left Ear: External ear normal.     Nose: Nose normal.     Mouth/Throat:     Mouth: Mucous membranes are moist.     Pharynx: Oropharynx is clear.  Eyes:     General: No scleral icterus.       Right eye: No discharge.        Left eye: No discharge.     Extraocular Movements: Extraocular movements intact.     Conjunctiva/sclera: Conjunctivae normal.     Pupils: Pupils are equal, round, and reactive to light.  Cardiovascular:     Rate and Rhythm: Normal rate and regular rhythm.     Pulses: Normal pulses.     Heart sounds: Normal heart sounds. No murmur heard.    No friction rub. No gallop.  Pulmonary:     Effort: Pulmonary effort is normal. No respiratory distress.     Breath sounds: Normal breath sounds. No stridor. No wheezing, rhonchi or rales.  Chest:     Chest wall: No tenderness.  Musculoskeletal:        General: Normal range of motion.     Cervical back: Normal range of motion and neck supple.  Skin:    General: Skin is warm and dry.     Capillary Refill: Capillary refill takes less than 2 seconds.     Coloration: Skin is not jaundiced or pale.     Findings: No bruising, erythema, lesion or rash.  Neurological:     General: No focal deficit present.     Mental Status: He is alert and oriented to person, place, and time. Mental status is at baseline.  Psychiatric:        Mood and Affect: Mood normal.        Behavior: Behavior normal.        Thought Content: Thought content normal.        Judgment: Judgment normal.     Results for orders placed or performed in visit on 08/06/22  CoaguChek XS/INR Waived  Result Value Ref Range   INR 1.0 0.9 - 1.1   Prothrombin Time 11.8 sec      Assessment & Plan:   Problem List Items Addressed This Visit   None Visit Diagnoses     Preop exam for  internal medicine    -  Primary   Cleared by cardiology this AM. Will check labs today. Await results. If normal, will clear.   Relevant Orders   CBC with Differential/Platelet   Comprehensive metabolic panel   CoaguChek XS/INR Waived (Completed)        Follow up plan: Return as scheduled.

## 2022-08-07 LAB — COMPREHENSIVE METABOLIC PANEL
ALT: 22 IU/L (ref 0–44)
AST: 22 IU/L (ref 0–40)
Albumin/Globulin Ratio: 2.2 (ref 1.2–2.2)
Albumin: 4.7 g/dL (ref 3.8–4.8)
Alkaline Phosphatase: 71 IU/L (ref 44–121)
BUN/Creatinine Ratio: 10 (ref 10–24)
BUN: 14 mg/dL (ref 8–27)
Bilirubin Total: 0.4 mg/dL (ref 0.0–1.2)
CO2: 26 mmol/L (ref 20–29)
Calcium: 9.7 mg/dL (ref 8.6–10.2)
Chloride: 98 mmol/L (ref 96–106)
Creatinine, Ser: 1.41 mg/dL — ABNORMAL HIGH (ref 0.76–1.27)
Globulin, Total: 2.1 g/dL (ref 1.5–4.5)
Glucose: 94 mg/dL (ref 70–99)
Potassium: 3.5 mmol/L (ref 3.5–5.2)
Sodium: 139 mmol/L (ref 134–144)
Total Protein: 6.8 g/dL (ref 6.0–8.5)
eGFR: 53 mL/min/{1.73_m2} — ABNORMAL LOW (ref >59)

## 2022-08-07 LAB — CBC WITH DIFFERENTIAL/PLATELET
Basophils Absolute: 0.1 10*3/uL (ref 0.0–0.2)
Basos: 1 %
EOS (ABSOLUTE): 0.1 10*3/uL (ref 0.0–0.4)
Eos: 2 %
Hematocrit: 53.9 % — ABNORMAL HIGH (ref 37.5–51.0)
Hemoglobin: 17.9 g/dL — ABNORMAL HIGH (ref 13.0–17.7)
Immature Grans (Abs): 0 10*3/uL (ref 0.0–0.1)
Immature Granulocytes: 0 %
Lymphocytes Absolute: 1.4 10*3/uL (ref 0.7–3.1)
Lymphs: 28 %
MCH: 29.5 pg (ref 26.6–33.0)
MCHC: 33.2 g/dL (ref 31.5–35.7)
MCV: 89 fL (ref 79–97)
Monocytes Absolute: 0.5 10*3/uL (ref 0.1–0.9)
Monocytes: 10 %
Neutrophils Absolute: 2.8 10*3/uL (ref 1.4–7.0)
Neutrophils: 59 %
Platelets: 234 10*3/uL (ref 150–450)
RBC: 6.07 x10E6/uL — ABNORMAL HIGH (ref 4.14–5.80)
RDW: 14.7 % (ref 11.6–15.4)
WBC: 4.9 10*3/uL (ref 3.4–10.8)

## 2022-08-14 ENCOUNTER — Ambulatory Visit: Payer: Medicare PPO | Admitting: Family Medicine

## 2022-08-14 DIAGNOSIS — M2021 Hallux rigidus, right foot: Secondary | ICD-10-CM | POA: Diagnosis not present

## 2022-08-14 DIAGNOSIS — M205X1 Other deformities of toe(s) (acquired), right foot: Secondary | ICD-10-CM | POA: Diagnosis not present

## 2022-08-14 DIAGNOSIS — M79672 Pain in left foot: Secondary | ICD-10-CM | POA: Diagnosis not present

## 2022-08-14 DIAGNOSIS — M205X2 Other deformities of toe(s) (acquired), left foot: Secondary | ICD-10-CM | POA: Diagnosis not present

## 2022-08-14 DIAGNOSIS — M2042 Other hammer toe(s) (acquired), left foot: Secondary | ICD-10-CM | POA: Diagnosis not present

## 2022-08-14 DIAGNOSIS — M19072 Primary osteoarthritis, left ankle and foot: Secondary | ICD-10-CM | POA: Diagnosis not present

## 2022-08-14 DIAGNOSIS — M2041 Other hammer toe(s) (acquired), right foot: Secondary | ICD-10-CM | POA: Diagnosis not present

## 2022-08-14 DIAGNOSIS — M19071 Primary osteoarthritis, right ankle and foot: Secondary | ICD-10-CM | POA: Diagnosis not present

## 2022-08-14 DIAGNOSIS — M79671 Pain in right foot: Secondary | ICD-10-CM | POA: Diagnosis not present

## 2022-08-16 DIAGNOSIS — Z981 Arthrodesis status: Secondary | ICD-10-CM | POA: Diagnosis not present

## 2022-08-22 DIAGNOSIS — M205X2 Other deformities of toe(s) (acquired), left foot: Secondary | ICD-10-CM | POA: Diagnosis not present

## 2022-08-22 DIAGNOSIS — M898X7 Other specified disorders of bone, ankle and foot: Secondary | ICD-10-CM | POA: Diagnosis not present

## 2022-08-22 DIAGNOSIS — M19072 Primary osteoarthritis, left ankle and foot: Secondary | ICD-10-CM | POA: Diagnosis not present

## 2022-08-22 DIAGNOSIS — S93145A Subluxation of metatarsophalangeal joint of left lesser toe(s), initial encounter: Secondary | ICD-10-CM | POA: Diagnosis not present

## 2022-08-22 DIAGNOSIS — M25872 Other specified joint disorders, left ankle and foot: Secondary | ICD-10-CM | POA: Diagnosis not present

## 2022-08-22 DIAGNOSIS — G8918 Other acute postprocedural pain: Secondary | ICD-10-CM | POA: Diagnosis not present

## 2022-08-22 DIAGNOSIS — M84478A Pathological fracture, left toe(s), initial encounter for fracture: Secondary | ICD-10-CM | POA: Diagnosis not present

## 2022-08-22 DIAGNOSIS — M2012 Hallux valgus (acquired), left foot: Secondary | ICD-10-CM | POA: Diagnosis not present

## 2022-08-22 DIAGNOSIS — M659 Synovitis and tenosynovitis, unspecified: Secondary | ICD-10-CM | POA: Diagnosis not present

## 2022-08-22 DIAGNOSIS — L608 Other nail disorders: Secondary | ICD-10-CM | POA: Diagnosis not present

## 2022-08-22 DIAGNOSIS — M2042 Other hammer toe(s) (acquired), left foot: Secondary | ICD-10-CM | POA: Diagnosis not present

## 2022-08-22 HISTORY — PX: FOOT SURGERY: SHX648

## 2022-08-30 DIAGNOSIS — M25572 Pain in left ankle and joints of left foot: Secondary | ICD-10-CM | POA: Diagnosis not present

## 2022-09-01 ENCOUNTER — Other Ambulatory Visit: Payer: Self-pay | Admitting: Family Medicine

## 2022-09-03 ENCOUNTER — Ambulatory Visit: Payer: Medicare PPO | Admitting: Dermatology

## 2022-09-03 VITALS — BP 159/85 | HR 77

## 2022-09-03 DIAGNOSIS — L820 Inflamed seborrheic keratosis: Secondary | ICD-10-CM

## 2022-09-03 DIAGNOSIS — L814 Other melanin hyperpigmentation: Secondary | ICD-10-CM

## 2022-09-03 DIAGNOSIS — H61001 Unspecified perichondritis of right external ear: Secondary | ICD-10-CM | POA: Diagnosis not present

## 2022-09-03 DIAGNOSIS — Z1283 Encounter for screening for malignant neoplasm of skin: Secondary | ICD-10-CM | POA: Diagnosis not present

## 2022-09-03 DIAGNOSIS — L570 Actinic keratosis: Secondary | ICD-10-CM | POA: Diagnosis not present

## 2022-09-03 DIAGNOSIS — Z85828 Personal history of other malignant neoplasm of skin: Secondary | ICD-10-CM | POA: Diagnosis not present

## 2022-09-03 DIAGNOSIS — L821 Other seborrheic keratosis: Secondary | ICD-10-CM

## 2022-09-03 DIAGNOSIS — L578 Other skin changes due to chronic exposure to nonionizing radiation: Secondary | ICD-10-CM | POA: Diagnosis not present

## 2022-09-03 DIAGNOSIS — L720 Epidermal cyst: Secondary | ICD-10-CM

## 2022-09-03 DIAGNOSIS — D229 Melanocytic nevi, unspecified: Secondary | ICD-10-CM

## 2022-09-03 NOTE — Telephone Encounter (Signed)
Requested medication (s) are due for refill today: yes  Requested medication (s) are on the active medication list: yes  Last refill:  11/11/20 #30 g  Future visit scheduled: yes  Notes to clinic:  med not assigned to a protocol   Requested Prescriptions  Pending Prescriptions Disp Refills   hydrocortisone (ANUSOL-HC) 2.5 % rectal cream [Pharmacy Med Name: HYDROCORTISONE (PERIANAL) 2.5% TOP] 30 g 0    Sig: APPLY RECTALLY TWICE DAILY AS DIRECTED     Off-Protocol Failed - 09/01/2022 10:19 AM      Failed - Medication not assigned to a protocol, review manually.      Passed - Valid encounter within last 12 months    Recent Outpatient Visits           4 weeks ago Preop exam for internal medicine   [place], [name], DO   2 months ago Essential hypertension   [place], Skene, DO   4 months ago Pre-op evaluation   [place], [place], DO   6 months ago Essential hypertension   [place], [place]ley, DO   1 year ago Routine general medical examination at a health care facility   [place], [name], DO       Future Appointments             In 2 months [name], [name], DO [place], [place]   In 9 months [name], [name], MD [place]   In 1 year [name], MD [place]           Not Delegated - Over the Counter: OTC 2 Failed - 09/01/2022 10:19 AM      Failed - This refill cannot be delegated      Passed - Valid encounter within last 12 months    Recent Outpatient Visits           4 weeks ago Preop exam for internal medicine   [place], [name], DO   2 months ago Essential hypertension   [place], [place], DO   4 months ago Pre-op evaluation   [place], [place], DO   6 months ago Essential hypertension   [place], [place], DO   1 year ago Routine general medical examination at a health care facility   [facility], [name], DO       Future Appointments             In 2 months [name], [name], DO [place], [place]   In 9 months [name], [name], MD [place]   In 1 year [name], MD [place]

## 2022-09-03 NOTE — Progress Notes (Signed)
Follow-Up Visit   Subjective  Kenneth Pruitt is a 71 y.o. male who presents for the following: Annual Exam (History of skin cancer - The patient presents for Upper Body Skin Exam (UBSE) for skin cancer screening and mole check.  The patient has spots, moles and lesions to be evaluated, some may be new or changing and the patient has concerns that these could be cancer./).  The following portions of the chart were reviewed this encounter and updated as appropriate:   Tobacco  Allergies  Meds  Problems  Med Hx  Surg Hx  Fam Hx     Review of Systems:  No other skin or systemic complaints except as noted in HPI or Assessment and Plan.  Objective  Well appearing patient in no apparent distress; mood and affect are within normal limits.  All skin waist up examined.  Left mid back 1.0 cm Subcutaneous nodule.   Arms (15) Erythematous stuck-on, waxy papule or plaque  Right Ear Flesh colored papule  Face, ears (7) Erythematous thin papules/macules with gritty scale.    Assessment & Plan   History of Skin Cancer  Clear. Observe for recurrence.  Call clinic for new or changing lesions.   Recommend regular skin exams, daily broad-spectrum spf 30+ sunscreen use, and photoprotection.      Lentigines - Scattered tan macules - Due to sun exposure - Benign-appearing, observe - Recommend daily broad spectrum sunscreen SPF 30+ to sun-exposed areas, reapply every 2 hours as needed. - Call for any changes  Seborrheic Keratoses - Stuck-on, waxy, tan-brown papules and/or plaques  - Benign-appearing - Discussed benign etiology and prognosis. - Observe - Call for any changes  Melanocytic Nevi - Tan-brown and/or pink-flesh-colored symmetric macules and papules - Benign appearing on exam today - Observation - Call clinic for new or changing moles - Recommend daily use of broad spectrum spf 30+ sunscreen to sun-exposed areas.   Hemangiomas - Red papules - Discussed benign  nature - Observe - Call for any changes  Actinic Damage - Chronic condition, secondary to cumulative UV/sun exposure - diffuse scaly erythematous macules with underlying dyspigmentation - Recommend daily broad spectrum sunscreen SPF 30+ to sun-exposed areas, reapply every 2 hours as needed.  - Staying in the shade or wearing long sleeves, sun glasses (UVA+UVB protection) and wide brim hats (4-inch brim around the entire circumference of the hat) are also recommended for sun protection.  - Call for new or changing lesions.  Skin cancer screening performed today.  Epidermal inclusion cyst Left mid back Benign-appearing. Exam most consistent with an epidermal inclusion cyst. Discussed that a cyst is a benign growth that can grow over time and sometimes get irritated or inflamed. Recommend observation if it is not bothersome. Discussed option of surgical excision to remove it if it is growing, symptomatic, or other changes noted. Please call for new or changing lesions so they can be evaluated.  Inflamed seborrheic keratosis (15) Arms Symptomatic, irritating, patient would like treated. 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  Trauma or pressure from sleeping on the ear or from cell phone use and sun damage may be exacerbating factors.  Treatment may  include using a C-shaped airplane neck pillow for sleeping on the side of the head so no pressure is on the ear.  Other treatments include topical or intralesional steroids; liquid nitrogen or laser destruction; shave removal or excision.  The condition can be difficult to treat and persist or recur despite treatment. Benign-appearing.  Observation.  Call clinic for new or changing lesions.  Recommend daily use of broad spectrum spf 30+ sunscreen to sun-exposed areas.   AK (actinic keratosis) (7) Face, ears Destruction of lesion - Face, ears Complexity: simple   Destruction method: cryotherapy   Informed consent: discussed and consent obtained   Timeout:  patient name, date of birth, surgical site, and procedure verified Lesion destroyed using liquid nitrogen: Yes   Region frozen until ice ball extended beyond lesion: Yes   Outcome: patient tolerated procedure well with no complications   Post-procedure details: wound care instructions given    Return in about 1 year (around 09/04/2023) for UBSE. Documentation: I have reviewed the above documentation for accuracy and completeness, and I agree with the above.  Sarina Ser, MD

## 2022-09-03 NOTE — Patient Instructions (Signed)
Cryotherapy Aftercare  Wash gently with soap and water everyday.   Apply Vaseline and Band-Aid daily until healed.     Due to recent changes in healthcare laws, you may see results of your pathology and/or laboratory studies on MyChart before the doctors have had a chance to review them. We understand that in some cases there may be results that are confusing or concerning to you. Please understand that not all results are received at the same time and often the doctors may need to interpret multiple results in order to provide you with the best plan of care or course of treatment. Therefore, we ask that you please give us 2 business days to thoroughly review all your results before contacting the office for clarification. Should we see a critical lab result, you will be contacted sooner.   If You Need Anything After Your Visit  If you have any questions or concerns for your doctor, please call our main line at [phone] and press option 4 to reach your doctor's medical assistant. If no one answers, please leave a voicemail as directed and we will return your call as soon as possible. Messages left after 4 pm will be answered the following business day.   You may also send us a message via MyChart. We typically respond to MyChart messages within 1-2 business days.  For prescription refills, please ask your pharmacy to contact our office. Our fax number is [phone].  If you have an urgent issue when the clinic is closed that cannot wait until the next business day, you can page your doctor at the number below.    Please note that while we do our best to be available for urgent issues outside of office hours, we are not available 24/7.   If you have an urgent issue and are unable to reach us, you may choose to seek medical care at your doctor's office, retail clinic, urgent care center, or emergency room.  If you have a medical emergency, please immediately call 911 or go to the  emergency department.  Pager Numbers  - Dr. Kowalski: [phone]  - Dr. Moye: [phone]  - Dr. Stewart: [phone]  In the event of inclement weather, please call our main line at [phone] for an update on the status of any delays or closures.  Dermatology Medication Tips: Please keep the boxes that topical medications come in in order to help keep track of the instructions about where and how to use these. Pharmacies typically print the medication instructions only on the boxes and not directly on the medication tubes.   If your medication is too expensive, please contact our office at [phone] option 4 or send us a message through MyChart.   We are unable to tell what your co-pay for medications will be in advance as this is different depending on your insurance coverage. However, we may be able to find a substitute medication at lower cost or fill out paperwork to get insurance to cover a needed medication.   If a prior authorization is required to get your medication covered by your insurance company, please allow us 1-2 business days to complete this process.  Drug prices often vary depending on where the prescription is filled and some pharmacies may offer cheaper prices.  The website www.goodrx.com contains coupons for medications through different pharmacies. The prices here do not account for what the cost may be with help from insurance (it may be cheaper with your insurance), but the website can   give you the price if you did not use any insurance.  - You can print the associated coupon and take it with your prescription to the pharmacy.  - You may also stop by our office during regular business hours and pick up a GoodRx coupon card.  - If you need your prescription sent electronically to a different pharmacy, notify our office through [facility] MyChart or by phone at [phone] option 4.     Si Usted Necesita Algo Despus de Su Visita  Tambin puede  enviarnos un mensaje a travs de MyChart. Por lo general respondemos a los mensajes de MyChart en el transcurso de 1 a 2 das hbiles.  Para renovar recetas, por favor pida a su farmacia que se ponga en contacto con nuestra oficina. Nuestro nmero de fax es el [phone].  Si tiene un asunto urgente cuando la clnica est cerrada y que no puede esperar hasta el siguiente da hbil, puede llamar/localizar a su doctor(a) al nmero que aparece a continuacin.   Por favor, tenga en cuenta que aunque hacemos todo lo posible para estar disponibles para asuntos urgentes fuera del horario de oficina, no estamos disponibles las 24 horas del da, los 7 das de la semana.   Si tiene un problema urgente y no puede comunicarse con nosotros, puede optar por buscar atencin mdica  en el consultorio de su doctor(a), en una clnica privada, en un centro de atencin urgente o en una sala de emergencias.  Si tiene una emergencia mdica, por favor llame inmediatamente al 911 o vaya a la sala de emergencias.  Nmeros de bper  - Dr. Kowalski: [phone]  - Dra. Moye: [phone]  - Dra. Stewart: [phone]  En caso de inclemencias del tiempo, por favor llame a nuestra lnea principal al [phone] para una actualizacin sobre el estado de cualquier retraso o cierre.  Consejos para la medicacin en dermatologa: Por favor, guarde las cajas en las que vienen los medicamentos de uso tpico para ayudarle a seguir las instrucciones sobre dnde y cmo usarlos. Las farmacias generalmente imprimen las instrucciones del medicamento slo en las cajas y no directamente en los tubos del medicamento.   Si su medicamento es muy caro, por favor, pngase en contacto con nuestra oficina llamando al [phone] y presione la opcin 4 o envenos un mensaje a travs de MyChart.   No podemos decirle cul ser su copago por los medicamentos por adelantado ya que esto es diferente dependiendo de la cobertura de su seguro.  Sin embargo, es posible que podamos encontrar un medicamento sustituto a menor costo o llenar un formulario para que el seguro cubra el medicamento que se considera necesario.   Si se requiere una autorizacin previa para que su compaa de seguros cubra su medicamento, por favor permtanos de 1 a 2 das hbiles para completar este proceso.  Los precios de los medicamentos varan con frecuencia dependiendo del lugar de dnde se surte la receta y alguna farmacias pueden ofrecer precios ms baratos.  El sitio web www.goodrx.com tiene cupones para medicamentos de diferentes farmacias. Los precios aqu no tienen en cuenta lo que podra costar con la ayuda del seguro (puede ser ms barato con su seguro), pero el sitio web puede darle el precio si no utiliz ningn seguro.  - Puede imprimir el cupn correspondiente y llevarlo con su receta a la farmacia.  - Tambin puede pasar por nuestra oficina durante el horario de atencin regular y recoger una tarjeta de cupones de GoodRx.  -   Si necesita que su receta se enve electrnicamente a una farmacia diferente, informe a nuestra oficina a travs de MyChart de [place] o por telfono llamando al [phone] y presione la opcin 4.  

## 2022-09-04 ENCOUNTER — Inpatient Hospital Stay: Payer: Medicare PPO | Admitting: Nurse Practitioner

## 2022-09-04 ENCOUNTER — Encounter: Payer: Self-pay | Admitting: Nurse Practitioner

## 2022-09-04 ENCOUNTER — Inpatient Hospital Stay: Payer: Medicare PPO | Attending: Oncology

## 2022-09-04 ENCOUNTER — Inpatient Hospital Stay: Payer: Medicare PPO

## 2022-09-04 VITALS — BP 141/83 | HR 88 | Temp 97.1°F | Ht 73.0 in | Wt 210.0 lb

## 2022-09-04 DIAGNOSIS — Z87891 Personal history of nicotine dependence: Secondary | ICD-10-CM | POA: Diagnosis not present

## 2022-09-04 DIAGNOSIS — D751 Secondary polycythemia: Secondary | ICD-10-CM

## 2022-09-04 LAB — CBC WITH DIFFERENTIAL/PLATELET
Abs Immature Granulocytes: 0.01 10*3/uL (ref 0.00–0.07)
Basophils Absolute: 0.1 10*3/uL (ref 0.0–0.1)
Basophils Relative: 1 %
Eosinophils Absolute: 0.2 10*3/uL (ref 0.0–0.5)
Eosinophils Relative: 3 %
HCT: 50.2 % (ref 39.0–52.0)
Hemoglobin: 16.6 g/dL (ref 13.0–17.0)
Immature Granulocytes: 0 %
Lymphocytes Relative: 24 %
Lymphs Abs: 1.6 10*3/uL (ref 0.7–4.0)
MCH: 28.7 pg (ref 26.0–34.0)
MCHC: 33.1 g/dL (ref 30.0–36.0)
MCV: 86.7 fL (ref 80.0–100.0)
Monocytes Absolute: 0.8 10*3/uL (ref 0.1–1.0)
Monocytes Relative: 12 %
Neutro Abs: 3.9 10*3/uL (ref 1.7–7.7)
Neutrophils Relative %: 60 %
Platelets: 299 10*3/uL (ref 150–400)
RBC: 5.79 MIL/uL (ref 4.22–5.81)
RDW: 14.6 % (ref 11.5–15.5)
WBC: 6.6 10*3/uL (ref 4.0–10.5)
nRBC: 0 % (ref 0.0–0.2)

## 2022-09-04 NOTE — Progress Notes (Signed)
No phelb today per PA

## 2022-09-04 NOTE — Progress Notes (Signed)
[facility]  [phone] [phone]  ID: Kenneth Pruitt OB: 1951/07/10  MR#: [id number]  RJJ#:[id number]  Patient Care Team: [name], DO as PCP - General (Family Medicine) Brendolyn Patty, MD (Dermatology) Earnestine Leys, MD (Specialist) Dionisio David, MD as Consulting Physician (Cardiology) Troxler, Kenneth Pruitt (Inactive) as Attending Physician (Podiatry) Lloyd Huger, MD as Consulting Physician (Oncology)  CHIEF COMPLAINT: Secondary polycythemia  INTERVAL HISTORY: Patient returns to clinic today for repeat laboratory work, further evaluation, and consideration of additional phlebotomy. He feels well and is recovering from surgery to his foot. Has plans to have other foot done as well. Has chronic pains due to many surgeries. Planning to travel to [place] next year. Unable to bike currently. He otherwise feels at baseline. He has no neurologic complaints.  He denies any recent fevers or illnesses. He has a good appetite and denies weight loss.  He denies any chest pain, shortness of breath, cough, or hemoptysis.  He denies any nausea, vomiting, constipation, or diarrhea. He has no urinary complaints.  Patient offers no specific complaints today.  REVIEW OF SYSTEMS:   Review of Systems  Constitutional: Negative.  Negative for fever, malaise/fatigue and weight loss.  Respiratory: Negative.  Negative for cough and shortness of breath.   Cardiovascular: Negative.  Negative for chest pain and leg swelling.  Gastrointestinal: Negative.  Negative for abdominal pain, blood in stool and melena.  Genitourinary: Negative.  Negative for dysuria.  Musculoskeletal: Negative.  Negative for back pain, joint pain and neck pain.  Skin: Negative.  Negative for rash.  Neurological: Negative.  Negative for dizziness, tingling, sensory change, focal weakness, weakness and headaches.  Psychiatric/Behavioral: Negative.  The patient is not nervous/anxious.    As per HPI. Otherwise, a complete review of systems is negative.  PAST MEDICAL HISTORY: Past Medical History:  Diagnosis Date   Arthritis    Coronary artery disease    Depression    Hyperlipidemia    Skin cancer    left forearm treated years ago by Dr. Golden Pop    PAST SURGICAL HISTORY: Past Surgical History:  Procedure Laterality Date   ANKLE FRACTURE SURGERY Right    ARTHRODESIS ANT INTERBODY INC DISCECTOMY, CERVICAL BELOW C2    05/2019   CARDIAC CATHETERIZATION N/A 04/26/2015   Procedure: Left Heart Cath;  Surgeon: Dionisio David, MD;  Location: [facility];  Service: Cardiovascular;  Laterality: N/A;   CLAVICLE SURGERY Right 05/04/2021   CORONARY ANGIOPLASTY     CORONARY STENT INTERVENTION N/A 03/27/2018   Procedure: CORONARY STENT INTERVENTION;  Surgeon: Yolonda Kida, MD;  Location: [facility];  Service: Cardiovascular;  Laterality: N/A;   EYE SURGERY Bilateral    cataract   LEFT HEART CATH AND CORONARY ANGIOGRAPHY Left 03/27/2018   Procedure: Left Heart Cath with possible coronary intervention;  Surgeon: Dionisio David, MD;  Location: [facility];  Service: Cardiovascular;  Laterality: Left;   LUMBAR FUSION  05/08/2022   TOTAL KNEE ARTHROPLASTY Right 10/08/2018   Procedure: TOTAL KNEE ARTHROPLASTY;  Surgeon: Lovell [name], MD;  Location: [facility];  Service: Orthopedics;  Laterality: Right;    FAMILY HISTORY: Family History  Problem Relation Age of Onset   Diabetes Mother    Hypertension Mother    Cancer Father    Depression Father    Anxiety disorder Father    Depression Daughter     ADVANCED DIRECTIVES (Y/N):  N  HEALTH MAINTENANCE: Social History  Tobacco Use   Smoking status: Former    Types: Cigars    Quit date: 07/18/2006    Years since quitting: 16.1   Smokeless tobacco: Never  Vaping Use   Vaping Use: Never used  Substance Use Topics   Alcohol use: No   Drug use: No  Loves dogs. Has 2 shiloh  shepherds. One named Acupuncturist.     Colonoscopy:  PAP:  Bone density:  Lipid panel:  Allergies  Allergen Reactions   Clonidine Derivatives Shortness Of Breath and Other (See Comments)    Low heart rate   Clonidine     Mild bradycardia + Somnolence    Current Outpatient Medications  Medication Sig Dispense Refill   acetaminophen (TYLENOL) 500 MG tablet Take by mouth.     aspirin 81 MG EC tablet Take 1 tablet (81 mg total) by mouth daily. 90 tablet 3   buPROPion (WELLBUTRIN XL) 150 MG 24 hr tablet Take 1 tablet (150 mg total) by mouth daily with breakfast. 90 tablet 1   chlorthalidone (HYGROTON) 25 MG tablet Take 25 mg by mouth daily.     Cholecalciferol 125 MCG (5000 UT) capsule Take 5,000 Units by mouth daily.     clopidogrel (PLAVIX) 75 MG tablet Take 75 mg by mouth daily.     clotrimazole-betamethasone (LOTRISONE) cream Apply 1 application topically 2 (two) times daily. 30 g 0   diclofenac Sodium (VOLTAREN) 1 % GEL      DULoxetine (CYMBALTA) 20 MG capsule Take 20 mg daily along with 60 mg - total of 80 mg . 90 capsule 1   DULoxetine (CYMBALTA) 60 MG capsule Take 1 capsule (60 mg total) by mouth daily. Take along with 20 mg - total of 80 mg 90 capsule 1   dutasteride (AVODART) 0.5 MG capsule TAKE 1 CAPSULE BY MOUTH EVERY DAY IN THEEVENING 90 capsule 0   fluticasone (FLONASE) 50 MCG/ACT nasal spray PLACE 2 SPRAYS INTO BOTH NOSTRILS DAILY 16 g 12   hydrALAZINE (APRESOLINE) 50 MG tablet Take 25 mg by mouth 2 (two) times daily.     hydrocortisone (ANUSOL-HC) 2.5 % rectal cream APPLY RECTALLY TWICE DAILY AS DIRECTED 30 g 0   hydrOXYzine (ATARAX) 25 MG tablet Take 25 mg by mouth every 8 (eight) hours as needed.     losartan (COZAAR) 50 MG tablet Take 1 tablet (50 mg total) by mouth daily. 90 tablet 1   mupirocin ointment (BACTROBAN) 2 % APPLY 1 APPLICATION TOPICALLY 2 TIMES DAILY 22 g 0   niacin (NIASPAN) 1000 MG CR tablet Take 2 tablets (2,000 mg total) by mouth at bedtime. 180 tablet  2   pantoprazole (PROTONIX) 20 MG tablet Take 20 mg by mouth daily.      rosuvastatin (CRESTOR) 40 MG tablet Take 1 tablet (40 mg total) by mouth every evening. 90 tablet 1   tamsulosin (FLOMAX) 0.4 MG CAPS capsule Take 1 capsule (0.4 mg total) by mouth daily. 90 capsule 1   testosterone cypionate (DEPOTESTOSTERONE CYPIONATE) 200 MG/ML injection INJECT 0.75 ML EVERY 10 DAYS AS DIRECTED 10 mL 0   VASCEPA 1 g capsule      vitamin B-12 (CYANOCOBALAMIN) 1000 MCG tablet Take 1,000 mcg by mouth daily.     amoxicillin (AMOXIL) 500 MG capsule Take 1,000 mg by mouth 2 (two) times daily. (Patient not taking: Reported on 08/06/2022)     meloxicam (MOBIC) 15 MG tablet Take 1 tablet (15 mg total) by mouth daily. (Patient not taking: Reported on 07/16/2022) 90  tablet 1   mirabegron ER (MYRBETRIQ) 50 MG TB24 tablet Take 1 tablet (50 mg total) by mouth daily. (Patient not taking: Reported on 07/16/2022) 30 tablet 0   No current facility-administered medications for this visit.    OBJECTIVE: Vitals:   09/04/22 1314  BP: (!) 141/83  Pulse: 88  Temp: (!) 97.1 F (36.2 C)     Body mass index is 27.71 kg/m.    ECOG FS:0 - Asymptomatic  General: Well-developed, well-nourished, no acute distress. Lungs: No audible wheezing or coughing Heart: Regular rate and rhythm.  Musculoskeletal: No edema, cyanosis, or clubbing. Post op shoe on left foot Neuro: Alert, answering all questions appropriately. Cranial nerves grossly intact.  Skin: No rashes or petechiae noted. Skin tear right shin.  Psych: Normal affect.  LAB RESULTS: Lab Results  Component Value Date   WBC 6.6 09/04/2022   NEUTROABS 3.9 09/04/2022   HGB 16.6 09/04/2022   HCT 50.2 09/04/2022   MCV 86.7 09/04/2022   PLT 299 09/04/2022   STUDIES: No results found.  ASSESSMENT: Secondary polycythemia  PLAN:    1. 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  Patient last had phlebotomy in July 2021. Previously, it was agreed upon the  goal hemoglobin will be between 17.0 and 17.5. Hemoglobin remains normal and no phlebotomy indicated today. Patient expressed understanding that as long as he requires testosterone, he likely will require periodic phlebotomies.  Return to clinic in 6 months with repeat laboratory, further evaluation, and continuation of phlebotomy if needed.  2. Low testosterone: Chronic and unchanged. Continue to treatment with testosterone per urology.  3.  Hypertension: Blood pressure is within normal limits today.  Disposition 6 mo- lab (cbc), Dr. Grayland Ormond or me, +/- phlebotomy- la  Patient expressed understanding and was in agreement with this plan. He also understands that He can call clinic at any time with any questions, concerns, or complaints.   Thank you for allowing me to participate in the care of this very pleasant patient.   Kenneth Au, NP   09/04/2022

## 2022-09-05 ENCOUNTER — Other Ambulatory Visit: Payer: Self-pay | Admitting: Psychiatry

## 2022-09-05 ENCOUNTER — Other Ambulatory Visit: Payer: Self-pay | Admitting: Family Medicine

## 2022-09-05 DIAGNOSIS — E782 Mixed hyperlipidemia: Secondary | ICD-10-CM

## 2022-09-05 DIAGNOSIS — F411 Generalized anxiety disorder: Secondary | ICD-10-CM

## 2022-09-06 NOTE — Telephone Encounter (Signed)
Requested Prescriptions  Pending Prescriptions Disp Refills   rosuvastatin (CRESTOR) 40 MG tablet [Pharmacy Med Name: ROSUVASTATIN CALCIUM 40 MG TAB] 90 tablet 1    Sig: TAKE 1 TABLET BY MOUTH EVERY EVENING     Cardiovascular:  Antilipid - Statins 2 Failed - 09/05/2022  2:01 PM      Failed - Cr in normal range and within 360 days    Creatinine, Ser  Date Value Ref Range Status  08/06/2022 1.41 (H) 0.76 - 1.27 mg/dL Final         Failed - Lipid Panel in normal range within the last 12 months    Cholesterol, Total  Date Value Ref Range Status  03/01/2022 94 (L) 100 - 199 mg/dL Final   Cholesterol Piccolo, Waived  Date Value Ref Range Status  08/06/2016 115 <200 mg/dL Final    Comment:                            Desirable                <200                         Borderline High      200- 239                         High                     >239    LDL Chol Calc (NIH)  Date Value Ref Range Status  03/01/2022 29 0 - 99 mg/dL Final   HDL  Date Value Ref Range Status  03/01/2022 43 >39 mg/dL Final   Triglycerides  Date Value Ref Range Status  03/01/2022 126 0 - 149 mg/dL Final   Triglycerides Piccolo,Waived  Date Value Ref Range Status  08/06/2016 188 (H) <150 mg/dL Final    Comment:                            Normal                   <150                         Borderline High     150 - 199                         High                200 - 499                         Very High                >499          Passed - Patient is not pregnant      Passed - Valid encounter within last 12 months    Recent Outpatient Visits           1 month ago Preop exam for internal medicine   [place], [name], DO   2 months ago Essential hypertension   [place], [name], DO   4 months ago Pre-op evaluation   [place],  [name], DO   6 months ago Essential hypertension   [place],  [name], DO   1 year ago Routine general medical examination at a health care facility   [place], [name], DO       Future Appointments             In 2 months [name], [name], DO [facility], [place]   In 9 months [name], [name], MD [place]   In 12 months [name], MD [place]

## 2022-09-13 DIAGNOSIS — R051 Acute cough: Secondary | ICD-10-CM | POA: Diagnosis not present

## 2022-09-13 DIAGNOSIS — Z6826 Body mass index (BMI) 26.0-26.9, adult: Secondary | ICD-10-CM | POA: Diagnosis not present

## 2022-09-14 ENCOUNTER — Encounter: Payer: Self-pay | Admitting: Dermatology

## 2022-09-25 DIAGNOSIS — M2021 Hallux rigidus, right foot: Secondary | ICD-10-CM | POA: Diagnosis not present

## 2022-09-25 DIAGNOSIS — Z4889 Encounter for other specified surgical aftercare: Secondary | ICD-10-CM | POA: Diagnosis not present

## 2022-09-25 DIAGNOSIS — M2022 Hallux rigidus, left foot: Secondary | ICD-10-CM | POA: Diagnosis not present

## 2022-09-28 ENCOUNTER — Other Ambulatory Visit: Payer: Self-pay | Admitting: Family Medicine

## 2022-09-28 DIAGNOSIS — N401 Enlarged prostate with lower urinary tract symptoms: Secondary | ICD-10-CM

## 2022-09-28 NOTE — Telephone Encounter (Signed)
Requested Prescriptions  Pending Prescriptions Disp Refills   dutasteride (AVODART) 0.5 MG capsule [Pharmacy Med Name: DUTASTERIDE 0.5 MG CAP] 90 capsule 0    Sig: TAKE 1 CAPSULE BY MOUTH EVERY DAY IN [place]     Urology: 5-alpha Reductase Inhibitors Passed - 09/28/2022 11:31 AM      Passed - PSA in normal range and within 360 days    Prostate Specific Ag, Serum  Date Value Ref Range Status  06/05/2022 2.7 0.0 - 4.0 ng/mL Final    Comment:    Roche ECLIA methodology. According to the American Urological Association, Serum PSA should decrease and remain at undetectable levels after radical prostatectomy. The AUA defines biochemical recurrence as an initial PSA value 0.2 ng/mL or greater followed by a subsequent confirmatory PSA value 0.2 ng/mL or greater. Values obtained with different assay methods or kits cannot be used interchangeably. Results cannot be interpreted as absolute evidence of the presence or absence of malignant disease.          Passed - Valid encounter within last 12 months    Recent Outpatient Visits           1 month ago Preop exam for internal medicine   [place], [name], DO   3 months ago Essential hypertension   [place], [place], DO   5 months ago Pre-op evaluation   [place], [place], DO   7 months ago Essential hypertension   [place], Dw[place], DO   1 year ago Routine general medical examination at a health care facility   [facility], [name], DO       Future Appointments             In 1 month Johnson, [name], DO [employer], [place]   In 8 months [name], [name], MD [place]   In 11 months [name], MD [place]

## 2022-10-02 ENCOUNTER — Other Ambulatory Visit: Payer: Self-pay | Admitting: Family Medicine

## 2022-10-02 DIAGNOSIS — M2021 Hallux rigidus, right foot: Secondary | ICD-10-CM | POA: Diagnosis not present

## 2022-10-02 DIAGNOSIS — N401 Enlarged prostate with lower urinary tract symptoms: Secondary | ICD-10-CM

## 2022-10-02 DIAGNOSIS — M2042 Other hammer toe(s) (acquired), left foot: Secondary | ICD-10-CM | POA: Diagnosis not present

## 2022-10-02 DIAGNOSIS — R972 Elevated prostate specific antigen [PSA]: Secondary | ICD-10-CM

## 2022-10-02 DIAGNOSIS — M2041 Other hammer toe(s) (acquired), right foot: Secondary | ICD-10-CM | POA: Diagnosis not present

## 2022-10-02 DIAGNOSIS — Z4889 Encounter for other specified surgical aftercare: Secondary | ICD-10-CM | POA: Diagnosis not present

## 2022-10-02 NOTE — Telephone Encounter (Signed)
Requested medication (s) are due for refill today: yes  Requested medication (s) are on the active medication list: yes  Last refill:  03/01/22 #90 1 refills  Future visit scheduled: yes 1 month  Notes to clinic:  BP 141/83 on 09/04/22. Protocol failed. Do you want to refill Rx?     Requested Prescriptions  Pending Prescriptions Disp Refills   tamsulosin (FLOMAX) 0.4 MG CAPS capsule [Pharmacy Med Name: TAMSULOSIN HCL 0.4 MG CAP] 90 capsule 1    Sig: TAKE 1 CAPSULE BY MOUTH ONCE DAILY     Urology: Alpha-Adrenergic Blocker Failed - 10/02/2022 11:08 AM      Failed - Last BP in normal range    BP Readings from Last 1 Encounters:  09/04/22 (!) 141/83         Passed - PSA in normal range and within 360 days    Prostate Specific Ag, Serum  Date Value Ref Range Status  06/05/2022 2.7 0.0 - 4.0 ng/mL Final    Comment:    Roche ECLIA methodology. According to the American Urological Association, Serum PSA should decrease and remain at undetectable levels after radical prostatectomy. The AUA defines biochemical recurrence as an initial PSA value 0.2 ng/mL or greater followed by a subsequent confirmatory PSA value 0.2 ng/mL or greater. Values obtained with different assay methods or kits cannot be used interchangeably. Results cannot be interpreted as absolute evidence of the presence or absence of malignant disease.          Passed - Valid encounter within last 12 months    Recent Outpatient Visits           1 month ago Preop exam for internal medicine   [place], [name], DO   3 months ago Essential hypertension   [place], [place], DO   5 months ago Pre-op evaluation   [place], [place], DO   7 months ago Essential hypertension   [place], [place], DO   1 year ago Routine general medical examination at a health care facility   [facility], [name], DO        Future Appointments             In 1 month Johnson, [name], DO [employer], [place]   In 8 months [name], [name], MD [place]   In 11 months [name], MD [place]

## 2022-10-05 ENCOUNTER — Other Ambulatory Visit: Payer: Self-pay | Admitting: Psychiatry

## 2022-10-05 DIAGNOSIS — F3342 Major depressive disorder, recurrent, in full remission: Secondary | ICD-10-CM

## 2022-10-15 DIAGNOSIS — M818 Other osteoporosis without current pathological fracture: Secondary | ICD-10-CM | POA: Diagnosis not present

## 2022-10-15 DIAGNOSIS — Z13820 Encounter for screening for osteoporosis: Secondary | ICD-10-CM | POA: Diagnosis not present

## 2022-10-15 DIAGNOSIS — Z79899 Other long term (current) drug therapy: Secondary | ICD-10-CM | POA: Diagnosis not present

## 2022-10-19 ENCOUNTER — Other Ambulatory Visit: Payer: Self-pay | Admitting: Family Medicine

## 2022-10-19 DIAGNOSIS — I10 Essential (primary) hypertension: Secondary | ICD-10-CM

## 2022-10-19 NOTE — Telephone Encounter (Signed)
Requested Prescriptions  Pending Prescriptions Disp Refills   losartan (COZAAR) 50 MG tablet [Pharmacy Med Name: LOSARTAN POTASSIUM 50 MG TAB] 90 tablet 1    Sig: TAKE 1 TABLET BY MOUTH DAILY     Cardiovascular:  Angiotensin Receptor Blockers Failed - 10/19/2022  2:31 PM      Failed - Cr in normal range and within 180 days    Creatinine, Ser  Date Value Ref Range Status  08/06/2022 1.41 (H) 0.76 - 1.27 mg/dL Final         Failed - Last BP in normal range    BP Readings from Last 1 Encounters:  09/04/22 (!) 141/83         Passed - K in normal range and within 180 days    Potassium  Date Value Ref Range Status  08/06/2022 3.5 3.5 - 5.2 mmol/L Final         Passed - Patient is not pregnant      Passed - Valid encounter within last 6 months    Recent Outpatient Visits           2 months ago Preop exam for internal medicine   [place], [name], DO   4 months ago Essential hypertension   [place], [place], DO   5 months ago Pre-op evaluation   [place], [place], DO   7 months ago Essential hypertension   [place], DO   1 year ago Routine general medical examination at a health care facility   [place], [name], DO       Future Appointments             In 3 weeks [name], [name], DO [place], [place]   In 7 months [name], [name], MD [place]   In 10 months [name], MD [place]

## 2022-10-25 DIAGNOSIS — Z4889 Encounter for other specified surgical aftercare: Secondary | ICD-10-CM | POA: Diagnosis not present

## 2022-10-29 ENCOUNTER — Other Ambulatory Visit: Payer: Self-pay | Admitting: Urology

## 2022-10-29 DIAGNOSIS — M2021 Hallux rigidus, right foot: Secondary | ICD-10-CM | POA: Diagnosis not present

## 2022-10-29 DIAGNOSIS — M2042 Other hammer toe(s) (acquired), left foot: Secondary | ICD-10-CM | POA: Diagnosis not present

## 2022-10-29 DIAGNOSIS — Z01818 Encounter for other preprocedural examination: Secondary | ICD-10-CM | POA: Diagnosis not present

## 2022-10-29 DIAGNOSIS — E291 Testicular hypofunction: Secondary | ICD-10-CM

## 2022-10-29 DIAGNOSIS — M2041 Other hammer toe(s) (acquired), right foot: Secondary | ICD-10-CM | POA: Diagnosis not present

## 2022-10-31 DIAGNOSIS — L608 Other nail disorders: Secondary | ICD-10-CM | POA: Diagnosis not present

## 2022-10-31 DIAGNOSIS — M2041 Other hammer toe(s) (acquired), right foot: Secondary | ICD-10-CM | POA: Diagnosis not present

## 2022-10-31 DIAGNOSIS — M85471 Solitary bone cyst, right ankle and foot: Secondary | ICD-10-CM | POA: Diagnosis not present

## 2022-10-31 DIAGNOSIS — M19071 Primary osteoarthritis, right ankle and foot: Secondary | ICD-10-CM | POA: Diagnosis not present

## 2022-10-31 DIAGNOSIS — I2510 Atherosclerotic heart disease of native coronary artery without angina pectoris: Secondary | ICD-10-CM | POA: Diagnosis not present

## 2022-10-31 DIAGNOSIS — M24574 Contracture, right foot: Secondary | ICD-10-CM | POA: Diagnosis not present

## 2022-10-31 DIAGNOSIS — M2021 Hallux rigidus, right foot: Secondary | ICD-10-CM | POA: Diagnosis not present

## 2022-10-31 DIAGNOSIS — G4733 Obstructive sleep apnea (adult) (pediatric): Secondary | ICD-10-CM | POA: Diagnosis not present

## 2022-10-31 DIAGNOSIS — G8918 Other acute postprocedural pain: Secondary | ICD-10-CM | POA: Diagnosis not present

## 2022-10-31 DIAGNOSIS — M2011 Hallux valgus (acquired), right foot: Secondary | ICD-10-CM | POA: Diagnosis not present

## 2022-10-31 DIAGNOSIS — S93524A Sprain of metatarsophalangeal joint of right lesser toe(s), initial encounter: Secondary | ICD-10-CM | POA: Diagnosis not present

## 2022-10-31 DIAGNOSIS — L84 Corns and callosities: Secondary | ICD-10-CM | POA: Diagnosis not present

## 2022-10-31 HISTORY — PX: FOOT SURGERY: SHX648

## 2022-11-03 ENCOUNTER — Other Ambulatory Visit: Payer: Self-pay | Admitting: Cardiovascular Disease

## 2022-11-06 ENCOUNTER — Ambulatory Visit: Payer: Medicare PPO | Admitting: Cardiovascular Disease

## 2022-11-06 ENCOUNTER — Encounter: Payer: Self-pay | Admitting: Cardiovascular Disease

## 2022-11-06 VITALS — BP 130/70 | HR 62 | Ht 73.0 in | Wt 214.4 lb

## 2022-11-06 DIAGNOSIS — Z9889 Other specified postprocedural states: Secondary | ICD-10-CM | POA: Diagnosis not present

## 2022-11-06 DIAGNOSIS — I10 Essential (primary) hypertension: Secondary | ICD-10-CM

## 2022-11-06 DIAGNOSIS — I25110 Atherosclerotic heart disease of native coronary artery with unstable angina pectoris: Secondary | ICD-10-CM | POA: Diagnosis not present

## 2022-11-06 DIAGNOSIS — E782 Mixed hyperlipidemia: Secondary | ICD-10-CM | POA: Diagnosis not present

## 2022-11-06 NOTE — Progress Notes (Signed)
Cardiology Office Note   Date:  11/06/2022   ID:  [name], [name] 30-Sep-1950, MRN [id number]  PCP:  [name], DO  Cardiologist:  Neoma Laming, MD      History of Present Illness: Kenneth Pruitt is a 72 y.o. male who presents for  Chief Complaint  Patient presents with   Follow-up    3 month fu    Shortness of Breath This is a chronic problem. The current episode started more than 1 year ago. The problem has been resolved.      Past Medical History:  Diagnosis Date   Arthritis    Coronary artery disease    Depression    Hyperlipidemia    Skin cancer    left forearm treated years ago by Dr. Golden Pop     Past Surgical History:  Procedure Laterality Date   ANKLE FRACTURE SURGERY Right    ARTHRODESIS ANT INTERBODY INC DISCECTOMY, CERVICAL BELOW C2    05/2019   CARDIAC CATHETERIZATION N/A 04/26/2015   Procedure: Left Heart Cath;  Surgeon: Dionisio David, MD;  Location: [facility];  Service: Cardiovascular;  Laterality: N/A;   CLAVICLE SURGERY Right 05/04/2021   CORONARY ANGIOPLASTY     CORONARY STENT INTERVENTION N/A 03/27/2018   Procedure: CORONARY STENT INTERVENTION;  Surgeon: Yolonda Kida, MD;  Location: [facility];  Service: Cardiovascular;  Laterality: N/A;   EYE SURGERY Bilateral    cataract   LEFT HEART CATH AND CORONARY ANGIOGRAPHY Left 03/27/2018   Procedure: Left Heart Cath with possible coronary intervention;  Surgeon: Dionisio David, MD;  Location: [facility];  Service: Cardiovascular;  Laterality: Left;   LUMBAR FUSION  05/08/2022   TOTAL KNEE ARTHROPLASTY Right 10/08/2018   Procedure: TOTAL KNEE ARTHROPLASTY;  Surgeon: Lovell [name], MD;  Location: [facility];  Service: Orthopedics;  Laterality: Right;     Current Outpatient Medications  Medication Sig Dispense Refill   acetaminophen (TYLENOL) 500 MG tablet Take by mouth.     aspirin 81 MG EC tablet Take 1 tablet (81 mg total) by mouth daily.  90 tablet 3   buPROPion (WELLBUTRIN XL) 150 MG 24 hr tablet TAKE 1 TABLET BY MOUTH ONCE DAILY WITH BREAKFAST 90 tablet 1   chlorthalidone (HYGROTON) 25 MG tablet Take 25 mg by mouth daily.     clopidogrel (PLAVIX) 75 MG tablet Take 75 mg by mouth daily.     DULoxetine (CYMBALTA) 60 MG capsule TAKE 1 CAPSULE BY MOUTH DAILY ALONG WITHTHE 20MG FOR A TOTAL OF 80MG 90 capsule 1   dutasteride (AVODART) 0.5 MG capsule TAKE 1 CAPSULE BY MOUTH EVERY DAY IN THEEVENING 90 capsule 1   fluticasone (FLONASE) 50 MCG/ACT nasal spray PLACE 2 SPRAYS INTO BOTH NOSTRILS DAILY 16 g 12   hydrALAZINE (APRESOLINE) 50 MG tablet Take 25 mg by mouth 2 (two) times daily.     hydrocortisone (ANUSOL-HC) 2.5 % rectal cream APPLY RECTALLY TWICE DAILY AS DIRECTED 30 g 0   hydrOXYzine (ATARAX) 25 MG tablet Take 25 mg by mouth every 8 (eight) hours as needed.     losartan (COZAAR) 50 MG tablet Take 1 tablet (50 mg total) by mouth daily. 90 tablet 1   mupirocin ointment (BACTROBAN) 2 % APPLY 1 APPLICATION TOPICALLY 2 TIMES DAILY 22 g 0   niacin (NIASPAN) 1000 MG CR tablet Take 2 tablets (2,000 mg total) by mouth at bedtime. 180 tablet 2   pantoprazole (PROTONIX)  20 MG tablet Take 20 mg by mouth daily.      rosuvastatin (CRESTOR) 40 MG tablet TAKE 1 TABLET BY MOUTH EVERY EVENING 90 tablet 1   tamsulosin (FLOMAX) 0.4 MG CAPS capsule TAKE 1 CAPSULE BY MOUTH ONCE DAILY 90 capsule 1   testosterone cypionate (DEPOTESTOSTERONE CYPIONATE) 200 MG/ML injection INJECT 0.75 ML INTRAMUSCULARLY EVERY 10 DAYS AS DIRECTED 10 mL 0   VASCEPA 1 g capsule      vitamin B-12 (CYANOCOBALAMIN) 1000 MCG tablet Take 1,000 mcg by mouth daily.     amoxicillin (AMOXIL) 500 MG capsule Take 1,000 mg by mouth 2 (two) times daily. (Patient not taking: Reported on 08/06/2022)     Cholecalciferol 125 MCG (5000 UT) capsule Take 5,000 Units by mouth daily. (Patient not taking: Reported on 11/06/2022)     clotrimazole-betamethasone (LOTRISONE) cream Apply 1 application  topically 2 (two) times daily. (Patient not taking: Reported on 11/06/2022) 30 g 0   diclofenac Sodium (VOLTAREN) 1 % GEL  (Patient not taking: Reported on 11/06/2022)     DULoxetine (CYMBALTA) 20 MG capsule Take 20 mg daily along with 60 mg - total of 80 mg . (Patient not taking: Reported on 11/06/2022) 90 capsule 1   meloxicam (MOBIC) 15 MG tablet Take 1 tablet (15 mg total) by mouth daily. (Patient not taking: Reported on 07/16/2022) 90 tablet 1   mirabegron ER (MYRBETRIQ) 50 MG TB24 tablet Take 1 tablet (50 mg total) by mouth daily. (Patient not taking: Reported on 07/16/2022) 30 tablet 0   No current facility-administered medications for this visit.    Allergies:   Clonidine derivatives and Clonidine    Social History:   reports that he quit smoking about 16 years ago. His smoking use included cigars. He has never used smokeless tobacco. He reports that he does not drink alcohol and does not use drugs.   Family History:  family history includes Anxiety disorder in his father; Cancer in his father; Depression in his daughter and father; Diabetes in his mother; Hypertension in his mother.    ROS:     Review of Systems  Constitutional: Negative.   HENT: Negative.    Eyes: Negative.   Respiratory:  Positive for shortness of breath.   Gastrointestinal: Negative.   Genitourinary: Negative.   Musculoskeletal: Negative.   Skin: Negative.   Neurological: Negative.   Endo/Heme/Allergies: Negative.   Psychiatric/Behavioral: Negative.    All other systems reviewed and are negative.     All other systems are reviewed and negative.    PHYSICAL EXAM: VS:  BP 130/70   Pulse 62   Ht 6' 1"$  (1.854 m)   Wt 214 lb 6.4 oz (97.3 kg)   SpO2 96%   BMI 28.29 kg/m  , BMI Body mass index is 28.29 kg/m. Last weight:  Wt Readings from Last 3 Encounters:  11/06/22 214 lb 6.4 oz (97.3 kg)  09/04/22 210 lb (95.3 kg)  08/06/22 214 lb 1.6 oz (97.1 kg)     Physical Exam Vitals reviewed.   Constitutional:      Appearance: Normal appearance. He is normal weight.  HENT:     Head: Normocephalic.     Nose: Nose normal.     Mouth/Throat:     Mouth: Mucous membranes are moist.  Eyes:     Pupils: Pupils are equal, round, and reactive to light.  Cardiovascular:     Rate and Rhythm: Normal rate and regular rhythm.     Pulses: Normal pulses.  Heart sounds: Normal heart sounds.  Pulmonary:     Effort: Pulmonary effort is normal.  Abdominal:     General: Abdomen is flat. Bowel sounds are normal.  Musculoskeletal:        General: Normal range of motion.     Cervical back: Normal range of motion.  Skin:    General: Skin is warm.  Neurological:     General: No focal deficit present.     Mental Status: He is alert.  Psychiatric:        Mood and Affect: Mood normal.       EKG:   Recent Labs: 08/06/2022: ALT 22; BUN 14; Creatinine, Ser 1.41; Potassium 3.5; Sodium 139 09/04/2022: Hemoglobin 16.6; Platelets 299    Lipid Panel    Component Value Date/Time   CHOL 94 (L) 03/01/2022 0831   CHOL 115 08/06/2016 0954   TRIG 126 03/01/2022 0831   TRIG 188 (H) 08/06/2016 0954   HDL 43 03/01/2022 0831   CHOLHDL 2.5 08/20/2019 0836   CHOLHDL 3.3 03/28/2018 0854   VLDL 37 03/28/2018 0854   VLDL 38 (H) 08/06/2016 0954   LDLCALC 29 03/01/2022 0831      Other studies Reviewed: REASON FOR VISIT  Visit for: Echocardiogram/I35.1  Sex:   Male          wt=213    lbs.  BP=130/70  Height= 73   inches.        TESTS  Imaging: Echocardiogram:  An echocardiogram in (2-d) mode was performed and in Doppler mode with color flow velocity mapping was performed. The aortic valve cusps are abnormal 1.4   cm, flow velocity 1.6   m/s, and systolic calculated mean flow gradient 6  mmHg. Mitral valve diastolic peak flow velocity E .66   m/s and E/A ratio 0.7. Aortic root diameter 3.1   cm. The LVOT internal diameter 2.0  cm and flow velocity was abnormal 1.5    m/s. LV systolic  dimension 3.2   cm, diastolic 4.9  cm, posterior wall thickness 1.1   cm, fractional shortening 30 %, and EF 60  %. IVS thickness 1.1   cm. LA dimension 4.9 cm  RIGHT atrium=  12.1  cm2. Tricuspid Valve has Trace Regurgitation.     ASSESSMENT  Technically adequate study.  Normal chamber sizes.  Normal left ventricular systolic function.  Mild left ventricular hypertrophy with GRADE 1 (relaxation abnormality) diastolic dysfunction.  Normal right ventricular systolic function.  Normal right ventricular diastolic function.  Normal left ventricular wall motion.  Normal right ventricular wall motion.  Trace tricuspid regurgitation.  Normal pulmonary artery pressure.  No pericardial effusion.  No LVH.     THERAPY   Referring physician: Dionisio David  Sonographer: STU.   Neoma Laming MD  Electronically signed by: Neoma Laming     Date: 08/24/2021 11:38 Additional studies/ records that were reviewed today  [facility] [address] [place], [place] [address] [phone] STUDY:  Gated Stress / Rest Myocardial Perfusion Imaging Tomographic (SPECT) Including attenuation correction Wall Motion, Left Ventricular Ejection Fraction By Gated Technique.Treadmill Stress Test. SEX: Male  WEIGHT: 210 lbs  HEIGHT: 73 in      ARMS UP: YES/NO                                                                        REFERRING PHYSICIAN: Dr.Aryiah Monterosso Humphrey Rolls                                                                                                                                                                                                                       INDICATION FOR STUDY: R55, Z95.5                                                                                                                                                                                                                     TECHNIQUE:  Approximately 20 minutes following the intravenous administration of 9.8 mCi of Tc-75mSestamibi after stress testing in a reclined supine position with arms above their head if able to do so, gated SPECT imaging of the heart was performed. After about a 2hr break, the patient was injected intravenously with 32.1  mCi of Tc-23mSestamibi.  Approximately 45 minutes later in the same position as stress imaging SPECT rest imaging of the heart was performed.  STRESS BY:  SNeoma Laming MD PROTOCOL:  BDarnell Level                                                                                       MAX PRED HR: 151                     85%: 128               75%: 113                                                                                                                   RESTING BP: 144/76  RESTING HR: 79  PEAK BP:  162/86   PEAK HR: 116 (77%)                                                                    EXERCISE DURATION:  8:03                                            METS: 7.1     REASON FOR TEST TERMINATION:  Leg discomfort                                                                                                                                 SYMPTOMS: Leg discomfort  [facility] TREADMILL SCORE: 8                                       RISK:  Low  EKG RESULTS: NSR. 79/min. No significant ST changes at peak exercise.                                                             IMAGE QUALITY: Good                                                                                                                                                                                                                                                                                                                                   PERFUSION/WALL MOTION FINDINGS: EF = 67%. Small mild reversible apex (17), apical inferior, mid inferior and basal inferior wall defects, normal wall motion.                                                                            IMPRESSION: Equivocal stress test with normal LVEF.  Neoma Laming, MD Stress Interpreting Physician / Nuclear Interpreting Physician                 Neoma Laming MD  Electronically signed by: Neoma Laming     Date: 08/28/2021 09:43include:  REASON FOR VISIT  Referred by Dr.Evan Osburn Humphrey Rolls.        TESTS  Imaging: Computed Tomographic Angiography:  Cardiac multidetector CT was performed paying particular attention to the coronary arteries for the diagnosis of: CAD. Diagnostic Drugs:  Administered iohexol (Omnipaque) through an antecubital vein and images from the examination were analyzed for the presence and extent of coronary artery disease, using 3D image processing software. 100 mL of non-ionic contrast (Omnipaque) was used.        ASSESSMENT   The mid-LAD artery was abnormal Calcified.  Diagnal stent has no restenosis  The proximal right coronary artery (RCA) was abnormal:1  The mid right coronary artery (RCA) was abnormal:1  The distal right coronary artery (RCA) was abnormal:1  The posterior descending coronary arteries were abnormal:1    Postero-Lateral Branch:1     TEST CONCLUSIONS  Quality of study: Good.  1-Calcium score: 1385.4  2-Right dominant system.  3-LCX and RCA normal, mild to moderate disease in mid LAD. Diagnal 1 has 70% disease to stent but stent has no restenosis.     PLAN     Athscl heart disease of native coronary  artery w/o ang pctrs Lab: BUN [place], [place] Lab: Creatinine [place], [place] Lab: CBC With Differential/Platelet      Neoma Laming MD  Electronically signed by: Neoma Laming     Date: 03/26/2018 09:49 Review of the above records demonstrates:       No data to display            ASSESSMENT AND PLAN:    ICD-10-CM   1. Coronary artery disease with unstable angina pectoris, unspecified vessel or lesion type, unspecified whether native or transplanted heart ([place])  I25.110     2. Essential hypertension  I10     3. History of cardiac catheterization  Z98.890     4. Mixed hyperlipidemia  E78.2        Problem List Items Addressed This Visit       Cardiovascular and Mediastinum   Essential hypertension   CAD (coronary artery disease) - Primary    Doing well, no chest pain or SOB        Other   Hyperlipidemia   History of cardiac catheterization       Disposition:   Return in about 2 months (around 01/05/2023).    Total time spent: 35 minutes  Signed,  Neoma Laming, MD  11/06/2022 10:22 AM    [facility]

## 2022-11-06 NOTE — Assessment & Plan Note (Signed)
Doing well, no chest pain or SOB

## 2022-11-12 ENCOUNTER — Encounter: Payer: Medicare PPO | Admitting: Family Medicine

## 2022-11-12 DIAGNOSIS — M2021 Hallux rigidus, right foot: Secondary | ICD-10-CM | POA: Diagnosis not present

## 2022-11-23 DIAGNOSIS — G4733 Obstructive sleep apnea (adult) (pediatric): Secondary | ICD-10-CM | POA: Diagnosis not present

## 2022-11-23 DIAGNOSIS — I10 Essential (primary) hypertension: Secondary | ICD-10-CM | POA: Diagnosis not present

## 2022-11-26 ENCOUNTER — Encounter: Payer: Self-pay | Admitting: Family Medicine

## 2022-11-26 ENCOUNTER — Ambulatory Visit (INDEPENDENT_AMBULATORY_CARE_PROVIDER_SITE_OTHER): Payer: Medicare PPO | Admitting: Family Medicine

## 2022-11-26 VITALS — BP 99/65 | HR 83 | Temp 98.1°F | Ht 73.0 in | Wt 215.4 lb

## 2022-11-26 DIAGNOSIS — N183 Chronic kidney disease, stage 3 unspecified: Secondary | ICD-10-CM | POA: Diagnosis not present

## 2022-11-26 DIAGNOSIS — R972 Elevated prostate specific antigen [PSA]: Secondary | ICD-10-CM | POA: Diagnosis not present

## 2022-11-26 DIAGNOSIS — Z1211 Encounter for screening for malignant neoplasm of colon: Secondary | ICD-10-CM

## 2022-11-26 DIAGNOSIS — Z0000 Encounter for general adult medical examination without abnormal findings: Secondary | ICD-10-CM | POA: Diagnosis not present

## 2022-11-26 DIAGNOSIS — N401 Enlarged prostate with lower urinary tract symptoms: Secondary | ICD-10-CM | POA: Diagnosis not present

## 2022-11-26 DIAGNOSIS — R350 Frequency of micturition: Secondary | ICD-10-CM | POA: Diagnosis not present

## 2022-11-26 DIAGNOSIS — E782 Mixed hyperlipidemia: Secondary | ICD-10-CM

## 2022-11-26 DIAGNOSIS — I10 Essential (primary) hypertension: Secondary | ICD-10-CM | POA: Diagnosis not present

## 2022-11-26 LAB — URINALYSIS, ROUTINE W REFLEX MICROSCOPIC
Bilirubin, UA: NEGATIVE
Glucose, UA: NEGATIVE
Leukocytes,UA: NEGATIVE
Nitrite, UA: NEGATIVE
RBC, UA: NEGATIVE
Specific Gravity, UA: 1.015 (ref 1.005–1.030)
Urobilinogen, Ur: 2 mg/dL — ABNORMAL HIGH (ref 0.2–1.0)
pH, UA: 7 (ref 5.0–7.5)

## 2022-11-26 LAB — MICROALBUMIN, URINE WAIVED
Creatinine, Urine Waived: 100 mg/dL (ref 10–300)
Microalb, Ur Waived: 80 mg/L — ABNORMAL HIGH (ref 0–19)

## 2022-11-26 MED ORDER — NIACIN ER (ANTIHYPERLIPIDEMIC) 1000 MG PO TBCR: 2000.0000 mg | EXTENDED_RELEASE_TABLET | Freq: Every day | ORAL | 2 refills | Status: DC

## 2022-11-26 MED ORDER — TAMSULOSIN HCL 0.4 MG PO CAPS: 0.4000 mg | ORAL_CAPSULE | Freq: Every day | ORAL | 1 refills | Status: DC

## 2022-11-26 MED ORDER — LOSARTAN POTASSIUM 50 MG PO TABS: 50.0000 mg | ORAL_TABLET | Freq: Every day | ORAL | 1 refills | Status: DC

## 2022-11-26 MED ORDER — DUTASTERIDE 0.5 MG PO CAPS: ORAL_CAPSULE | ORAL | 1 refills | Status: DC

## 2022-11-26 MED ORDER — ROSUVASTATIN CALCIUM 40 MG PO TABS: 40.0000 mg | ORAL_TABLET | Freq: Every evening | ORAL | 1 refills | Status: DC

## 2022-11-26 MED ORDER — FLUTICASONE PROPIONATE 50 MCG/ACT NA SUSP: 2.0000 | Freq: Every day | NASAL | 12 refills | Status: DC

## 2022-11-26 NOTE — Assessment & Plan Note (Signed)
Rechecking labs today. Await results. Treat as needed.  °

## 2022-11-26 NOTE — Assessment & Plan Note (Signed)
Under good control on current regimen. Continue current regimen. Continue to monitor. Call with any concerns. Refills given. Labs drawn today. He is running a little low- given history of low blood pressure, will continue to monitor at home. May have to stop his amlodipine.

## 2022-11-26 NOTE — Assessment & Plan Note (Signed)
Under good control on current regimen. Continue current regimen. Continue to monitor. Call with any concerns. Refills given. Labs drawn today.   

## 2022-11-26 NOTE — Assessment & Plan Note (Signed)
Stable. Labs drawn today. Await results. Treat as needed.

## 2022-11-26 NOTE — Progress Notes (Signed)
BP 99/65   Pulse 83   Temp 98.1 F (36.7 C) (Oral)   Ht '6\' 1"'$  (1.854 m)   Wt 215 lb 6.4 oz (97.7 kg)   SpO2 99%   BMI 28.42 kg/m    Subjective:    Patient ID: Kenneth Pruitt, male    DOB: 17-Oct-1950, 72 y.o.   MRN: [id number]  HPI: Kenneth Pruitt is a 72 y.o. male presenting on 11/26/2022 for comprehensive medical examination. Current medical complaints include:  HYPERTENSION / HYPERLIPIDEMIA Satisfied with current treatment? yes Duration of hypertension: chronic BP monitoring frequency: a few times a month BP medication side effects: no Duration of hyperlipidemia: chronic Cholesterol medication side effects: no Cholesterol supplements: fish oil, niacin Past cholesterol medications: crestor Medication compliance: excellent compliance Aspirin: no Recent stressors: no Recurrent headaches: no Visual changes: no Palpitations: no Dyspnea: no Chest pain: no Lower extremity edema: no Dizzy/lightheaded: no  BPH BPH status: controlled Satisfied with current treatment?: yes Medication side effects: no Medication compliance: excellent compliance Duration: chronic Nocturia: 1-2x per night Urinary frequency:no Incomplete voiding: no Urgency: no Weak urinary stream: no Straining to start stream: no Dysuria: no Onset: gradual Severity: mild   He currently lives with: wife Interim Problems from his last visit: no  Depression Screen done today and results listed below:     07/16/2022    9:48 AM 06/12/2022    9:44 AM 06/11/2022    9:34 AM 05/01/2022    9:09 AM 04/09/2022    9:19 AM  Depression screen PHQ 2/9  Decreased Interest  0 1 1   Down, Depressed, Hopeless  0 1 1   PHQ - 2 Score  0 2 2   Altered sleeping  0 1 0   Tired, decreased energy  0 1 3   Change in appetite  0 0 1   Feeling bad or failure about yourself   0 2 1   Trouble concentrating  0 2 1   Moving slowly or fidgety/restless  0 0 0   Suicidal thoughts  0 0 1   PHQ-9 Score  0 8 9   Difficult  doing work/chores  Not difficult at all Somewhat difficult Somewhat difficult      Information is confidential and restricted. Go to Review Flowsheets to unlock data.    Past Medical History:  Past Medical History:  Diagnosis Date   Arthritis    Coronary artery disease    Depression    Hyperlipidemia    Skin cancer    left forearm treated years ago by Dr. Golden Pop    Surgical History:  Past Surgical History:  Procedure Laterality Date   ANKLE FRACTURE SURGERY Right    ARTHRODESIS ANT INTERBODY INC DISCECTOMY, CERVICAL BELOW C2    05/2019   CARDIAC CATHETERIZATION N/A 04/26/2015   Procedure: Left Heart Cath;  Surgeon: Dionisio David, MD;  Location: [facility];  Service: Cardiovascular;  Laterality: N/A;   CLAVICLE SURGERY Right 05/04/2021   CORONARY ANGIOPLASTY     CORONARY STENT INTERVENTION N/A 03/27/2018   Procedure: CORONARY STENT INTERVENTION;  Surgeon: Yolonda Kida, MD;  Location: [facility];  Service: Cardiovascular;  Laterality: N/A;   EYE SURGERY Bilateral    cataract   FOOT SURGERY Left 08/22/2022   FOOT SURGERY Right 10/31/2022   LEFT HEART CATH AND CORONARY ANGIOGRAPHY Left 03/27/2018   Procedure: Left Heart Cath with possible coronary intervention;  Surgeon: Dionisio David, MD;  Location: [facility]  INVASIVE [facility];  Service: Cardiovascular;  Laterality: Left;   LUMBAR FUSION  05/08/2022   TOTAL KNEE ARTHROPLASTY Right 10/08/2018   Procedure: TOTAL KNEE ARTHROPLASTY;  Surgeon: Lovell [name], MD;  Location: [facility];  Service: Orthopedics;  Laterality: Right;    Medications:  Current Outpatient Medications on File Prior to Visit  Medication Sig   acetaminophen (TYLENOL) 500 MG tablet Take by mouth.   amLODipine (NORVASC) 5 MG tablet Take by mouth.   aspirin 81 MG EC tablet Take 1 tablet (81 mg total) by mouth daily.   buPROPion (WELLBUTRIN XL) 150 MG 24 hr tablet TAKE 1 TABLET BY MOUTH ONCE DAILY WITH BREAKFAST   chlorthalidone  (HYGROTON) 25 MG tablet TAKE ONE TABLET BY MOUTH EVERY DAY   Cholecalciferol 125 MCG (5000 UT) capsule Take 5,000 Units by mouth daily.   clopidogrel (PLAVIX) 75 MG tablet Take 75 mg by mouth daily.   clotrimazole-betamethasone (LOTRISONE) cream Apply 1 application topically 2 (two) times daily.   diclofenac Sodium (VOLTAREN) 1 % GEL    DULoxetine (CYMBALTA) 20 MG capsule Take 20 mg daily along with 60 mg - total of 80 mg .   DULoxetine (CYMBALTA) 60 MG capsule TAKE 1 CAPSULE BY MOUTH DAILY ALONG WITHTHE '20MG'$  FOR A TOTAL OF '80MG'$    hydrALAZINE (APRESOLINE) 50 MG tablet Take 25 mg by mouth 2 (two) times daily.   hydrocortisone (ANUSOL-HC) 2.5 % rectal cream APPLY RECTALLY TWICE DAILY AS DIRECTED   hydrOXYzine (ATARAX) 25 MG tablet Take 25 mg by mouth every 8 (eight) hours as needed.   mupirocin ointment (BACTROBAN) 2 % APPLY 1 APPLICATION TOPICALLY 2 TIMES DAILY   pantoprazole (PROTONIX) 20 MG tablet Take 20 mg by mouth daily.    testosterone cypionate (DEPOTESTOSTERONE CYPIONATE) 200 MG/ML injection INJECT 0.75 ML INTRAMUSCULARLY EVERY 10 DAYS AS DIRECTED   VASCEPA 1 g capsule    vitamin B-12 (CYANOCOBALAMIN) 1000 MCG tablet Take 1,000 mcg by mouth daily.   No current facility-administered medications on file prior to visit.    Allergies:  Allergies  Allergen Reactions   Clonidine Derivatives Shortness Of Breath and Other (See Comments)    Low heart rate   Clonidine     Mild bradycardia + Somnolence    Social History:  Social History   Socioeconomic History   Marital status: Married    Spouse name: ann   Number of children: 2   Years of education: Not on file   Highest education level: Master's degree (e.g., MA, MS, MEng, MEd, MSW, MBA)  Occupational History    Comment: retired  Tobacco Use   Smoking status: Former    Types: Cigars    Quit date: 07/18/2006    Years since quitting: 16.3   Smokeless tobacco: Never  Vaping Use   Vaping Use: Never used  Substance and Sexual  Activity   Alcohol use: No   Drug use: No   Sexual activity: Not Currently  Other Topics Concern   Not on file  Social History Narrative   Not on file   Social Determinants of Health   Financial Resource Strain: Low Risk  (06/12/2022)   Overall Financial Resource Strain (CARDIA)    Difficulty of Paying Living Expenses: Not hard at all  Food Insecurity: No Food Insecurity (06/12/2022)   Hunger Vital Sign    Worried About Running Out of Food in the Last Year: Never true    Ran Out of Food in the Last Year: Never true  Transportation Needs:  No Transportation Needs (06/12/2022)   PRAPARE - Hydrologist (Medical): No    Lack of Transportation (Non-Medical): No  Physical Activity: Insufficiently Active (06/12/2022)   Exercise Vital Sign    Days of Exercise per Week: 3 days    Minutes of Exercise per Session: 30 min  Stress: No Stress Concern Present (06/12/2022)   [place]    Feeling of Stress : Not at all  Social Connections: Moderately Integrated (06/12/2022)   Social Connection and Isolation Panel [NHANES]    Frequency of Communication with Friends and Family: Twice a week    Frequency of Social Gatherings with Friends and Family: More than three times a week    Attends Religious Services: 1 to 4 times per year    Active Member of [employer] or Organizations: No    Attends Archivist Meetings: Never    Marital Status: Married  Human resources officer Violence: Not At Risk (06/12/2022)   Humiliation, Afraid, Rape, and Kick questionnaire    Fear of Current or Ex-Partner: No    Emotionally Abused: No    Physically Abused: No    Sexually Abused: No   Social History   Tobacco Use  Smoking Status Former   Types: Cigars   Quit date: 07/18/2006   Years since quitting: 16.3  Smokeless Tobacco Never   Social History   Substance and Sexual Activity  Alcohol Use No    Family History:   Family History  Problem Relation Age of Onset   Diabetes Mother    Hypertension Mother    Cancer Father    Depression Father    Anxiety disorder Father    Depression Daughter     Past medical history, surgical history, medications, allergies, family history and social history reviewed with patient today and changes made to appropriate areas of the chart.   Review of Systems  Constitutional: Negative.   HENT: Negative.    Eyes: Negative.   Respiratory: Negative.    Cardiovascular: Negative.   Gastrointestinal: Negative.   Genitourinary: Negative.   Musculoskeletal: Negative.   Skin: Negative.   Neurological:  Positive for tingling (from the surgery in his R foot). Negative for dizziness, tremors, sensory change, speech change, focal weakness, seizures, loss of consciousness, weakness and headaches.  Endo/Heme/Allergies:  Negative for environmental allergies and polydipsia. Bruises/bleeds easily.  Psychiatric/Behavioral: Negative.     All other ROS negative except what is listed above and in the HPI.      Objective:    BP 99/65   Pulse 83   Temp 98.1 F (36.7 C) (Oral)   Ht '6\' 1"'$  (1.854 m)   Wt 215 lb 6.4 oz (97.7 kg)   SpO2 99%   BMI 28.42 kg/m   Wt Readings from Last 3 Encounters:  11/26/22 215 lb 6.4 oz (97.7 kg)  11/06/22 214 lb 6.4 oz (97.3 kg)  09/04/22 210 lb (95.3 kg)    Physical Exam Vitals and nursing note reviewed.  Constitutional:      General: He is not in acute distress.    Appearance: Normal appearance. He is not ill-appearing, toxic-appearing or diaphoretic.  HENT:     Head: Normocephalic and atraumatic.     Right Ear: Tympanic membrane, ear canal and external ear normal. There is no impacted cerumen.     Left Ear: Tympanic membrane, ear canal and external ear normal. There is no impacted cerumen.     Nose: Nose  normal. No congestion or rhinorrhea.     Mouth/Throat:     Mouth: Mucous membranes are moist.     Pharynx: Oropharynx is clear. No  oropharyngeal exudate or posterior oropharyngeal erythema.  Eyes:     General: No scleral icterus.       Right eye: No discharge.        Left eye: No discharge.     Extraocular Movements: Extraocular movements intact.     Conjunctiva/sclera: Conjunctivae normal.     Pupils: Pupils are equal, round, and reactive to light.  Neck:     Vascular: No carotid bruit.  Cardiovascular:     Rate and Rhythm: Normal rate and regular rhythm.     Pulses: Normal pulses.     Heart sounds: No murmur heard.    No friction rub. No gallop.  Pulmonary:     Effort: Pulmonary effort is normal. No respiratory distress.     Breath sounds: Normal breath sounds. No stridor. No wheezing, rhonchi or rales.  Chest:     Chest wall: No tenderness.  Abdominal:     General: Abdomen is flat. Bowel sounds are normal. There is no distension.     Palpations: Abdomen is soft. There is no mass.     Tenderness: There is no abdominal tenderness. There is no right CVA tenderness, left CVA tenderness, guarding or rebound.     Hernia: No hernia is present.  Genitourinary:    Comments: Genital exam deferred with shared decision making Musculoskeletal:        General: No swelling, tenderness, deformity or signs of injury.     Cervical back: Normal range of motion and neck supple. No rigidity. No muscular tenderness.     Right lower leg: No edema.     Left lower leg: No edema.  Lymphadenopathy:     Cervical: No cervical adenopathy.  Skin:    General: Skin is warm and dry.     Capillary Refill: Capillary refill takes less than 2 seconds.     Coloration: Skin is not jaundiced or pale.     Findings: No bruising, erythema, lesion or rash.  Neurological:     General: No focal deficit present.     Mental Status: He is alert and oriented to person, place, and time.     Cranial Nerves: No cranial nerve deficit.     Sensory: No sensory deficit.     Motor: No weakness.     Coordination: Coordination normal.     Gait: Gait  normal.     Deep Tendon Reflexes: Reflexes normal.  Psychiatric:        Mood and Affect: Mood normal.        Behavior: Behavior normal.        Thought Content: Thought content normal.        Judgment: Judgment normal.     Results for orders placed or performed in visit on 09/04/22  CBC with Differential  Result Value Ref Range   WBC 6.6 4.0 - 10.5 K/uL   RBC 5.79 4.22 - 5.81 MIL/uL   Hemoglobin 16.6 13.0 - 17.0 g/dL   HCT 50.2 39.0 - 52.0 %   MCV 86.7 80.0 - 100.0 fL   MCH 28.7 26.0 - 34.0 pg   MCHC 33.1 30.0 - 36.0 g/dL   RDW 14.6 11.5 - 15.5 %   Platelets 299 150 - 400 K/uL   nRBC 0.0 0.0 - 0.2 %   Neutrophils Relative % 60 %  Neutro Abs 3.9 1.7 - 7.7 K/uL   Lymphocytes Relative 24 %   Lymphs Abs 1.6 0.7 - 4.0 K/uL   Monocytes Relative 12 %   Monocytes Absolute 0.8 0.1 - 1.0 K/uL   Eosinophils Relative 3 %   Eosinophils Absolute 0.2 0.0 - 0.5 K/uL   Basophils Relative 1 %   Basophils Absolute 0.1 0.0 - 0.1 K/uL   Immature Granulocytes 0 %   Abs Immature Granulocytes 0.01 0.00 - 0.07 K/uL      Assessment & Plan:   Problem List Items Addressed This Visit       Cardiovascular and Mediastinum   Essential hypertension    Under good control on current regimen. Continue current regimen. Continue to monitor. Call with any concerns. Refills given. Labs drawn today. He is running a little low- given history of low blood pressure, will continue to monitor at home. May have to stop his amlodipine.        Relevant Medications   amLODipine (NORVASC) 5 MG tablet   losartan (COZAAR) 50 MG tablet   niacin (NIASPAN) 1000 MG CR tablet   rosuvastatin (CRESTOR) 40 MG tablet   Other Relevant Orders   Comprehensive metabolic panel   CBC with Differential/Platelet   TSH   Urinalysis, Routine w reflex microscopic   Microalbumin, Urine Waived     Genitourinary   BPH (benign prostatic hyperplasia)    Under good control on current regimen. Continue current regimen. Continue to  monitor. Call with any concerns. Refills given. Labs drawn today.        Relevant Medications   dutasteride (AVODART) 0.5 MG capsule   tamsulosin (FLOMAX) 0.4 MG CAPS capsule   Other Relevant Orders   Comprehensive metabolic panel   CBC with Differential/Platelet   PSA   Chronic kidney disease (CKD), stage III (moderate) (HCC)    Stable. Labs drawn today. Await results. Treat as needed.         Other   Hyperlipidemia    Under good control on current regimen. Continue current regimen. Continue to monitor. Call with any concerns. Refills given. Labs drawn today.        Relevant Medications   amLODipine (NORVASC) 5 MG tablet   losartan (COZAAR) 50 MG tablet   niacin (NIASPAN) 1000 MG CR tablet   rosuvastatin (CRESTOR) 40 MG tablet   Other Relevant Orders   Comprehensive metabolic panel   CBC with Differential/Platelet   Lipid Panel w/o Chol/HDL Ratio   Elevated PSA    Rechecking labs today. Await results. Treat as needed.       Relevant Medications   tamsulosin (FLOMAX) 0.4 MG CAPS capsule   Other Relevant Orders   Comprehensive metabolic panel   CBC with Differential/Platelet   PSA   Other Visit Diagnoses     Routine general medical examination at a health care facility    -  Primary   Vaccines up to date. Screening labs checked today. Cologuard ordered today. Continue diet and exercise. Call with any concerns.   Screening for colon cancer       Due in June. Cologuard ordered today.   Relevant Orders   Cologuard       LABORATORY TESTING:  Health maintenance labs ordered today as discussed above.   The natural history of prostate cancer and ongoing controversy regarding screening and potential treatment outcomes of prostate cancer has been discussed with the patient. The meaning of a false positive PSA and a false negative PSA has been  discussed. He indicates understanding of the limitations of this screening test and wishes to proceed with screening PSA  testing.   IMMUNIZATIONS:   - Tdap: Tetanus vaccination status reviewed: last tetanus booster within 10 years. - Influenza: Up to date - Pneumovax: Up to date - Prevnar: Up to date - COVID: Up to date - HPV: Not applicable - Shingrix vaccine: Given elsewhere  SCREENING: - Colonoscopy: Up to date  Discussed with patient purpose of the colonoscopy is to detect colon cancer at curable precancerous or early stages   PATIENT COUNSELING:    Sexuality: Discussed sexually transmitted diseases, partner selection, use of condoms, avoidance of unintended pregnancy  and contraceptive alternatives.   Advised to avoid cigarette smoking.  I discussed with the patient that most people either abstain from alcohol or drink within safe limits (<=14/week and <=4 drinks/occasion for males, <=7/weeks and <= 3 drinks/occasion for females) and that the risk for alcohol disorders and other health effects rises proportionally with the number of drinks per week and how often a drinker exceeds daily limits.  Discussed cessation/primary prevention of drug use and availability of treatment for abuse.   Diet: Encouraged to adjust caloric intake to maintain  or achieve ideal body weight, to reduce intake of dietary saturated fat and total fat, to limit sodium intake by avoiding high sodium foods and not adding table salt, and to maintain adequate dietary potassium and calcium preferably from fresh fruits, vegetables, and low-fat dairy products.    stressed the importance of regular exercise  Injury prevention: Discussed safety belts, safety helmets, smoke detector, smoking near bedding or upholstery.   Dental health: Discussed importance of regular tooth brushing, flossing, and dental visits.   Follow up plan: NEXT PREVENTATIVE PHYSICAL DUE IN 1 YEAR. Return in about 6 months (around 05/29/2023).

## 2022-11-27 LAB — CBC WITH DIFFERENTIAL/PLATELET
Basophils Absolute: 0 10*3/uL (ref 0.0–0.2)
Basos: 0 %
EOS (ABSOLUTE): 0.4 10*3/uL (ref 0.0–0.4)
Eos: 5 %
Hematocrit: 48.3 % (ref 37.5–51.0)
Hemoglobin: 16.4 g/dL (ref 13.0–17.7)
Immature Grans (Abs): 0 10*3/uL (ref 0.0–0.1)
Immature Granulocytes: 0 %
Lymphocytes Absolute: 1.4 10*3/uL (ref 0.7–3.1)
Lymphs: 21 %
MCH: 29.2 pg (ref 26.6–33.0)
MCHC: 34 g/dL (ref 31.5–35.7)
MCV: 86 fL (ref 79–97)
Monocytes Absolute: 0.7 10*3/uL (ref 0.1–0.9)
Monocytes: 10 %
Neutrophils Absolute: 4.4 10*3/uL (ref 1.4–7.0)
Neutrophils: 64 %
Platelets: 226 10*3/uL (ref 150–450)
RBC: 5.62 x10E6/uL (ref 4.14–5.80)
RDW: 14.9 % (ref 11.6–15.4)
WBC: 6.9 10*3/uL (ref 3.4–10.8)

## 2022-11-27 LAB — COMPREHENSIVE METABOLIC PANEL
ALT: 17 IU/L (ref 0–44)
AST: 15 IU/L (ref 0–40)
Albumin/Globulin Ratio: 2.5 — ABNORMAL HIGH (ref 1.2–2.2)
Albumin: 4.5 g/dL (ref 3.8–4.8)
Alkaline Phosphatase: 71 IU/L (ref 44–121)
BUN/Creatinine Ratio: 11 (ref 10–24)
BUN: 14 mg/dL (ref 8–27)
Bilirubin Total: 0.4 mg/dL (ref 0.0–1.2)
CO2: 25 mmol/L (ref 20–29)
Calcium: 9.4 mg/dL (ref 8.6–10.2)
Chloride: 99 mmol/L (ref 96–106)
Creatinine, Ser: 1.27 mg/dL (ref 0.76–1.27)
Globulin, Total: 1.8 g/dL (ref 1.5–4.5)
Glucose: 130 mg/dL — ABNORMAL HIGH (ref 70–99)
Potassium: 3.7 mmol/L (ref 3.5–5.2)
Sodium: 140 mmol/L (ref 134–144)
Total Protein: 6.3 g/dL (ref 6.0–8.5)
eGFR: 60 mL/min/{1.73_m2} (ref >59)

## 2022-11-27 LAB — PSA: Prostate Specific Ag, Serum: 2.3 ng/mL (ref 0.0–4.0)

## 2022-11-27 LAB — LIPID PANEL W/O CHOL/HDL RATIO
Cholesterol, Total: 89 mg/dL — ABNORMAL LOW (ref 100–199)
HDL: 36 mg/dL — ABNORMAL LOW (ref >39)
LDL Chol Calc (NIH): 22 mg/dL (ref 0–99)
Triglycerides: 193 mg/dL — ABNORMAL HIGH (ref 0–149)
VLDL Cholesterol Cal: 31 mg/dL (ref 5–40)

## 2022-11-27 LAB — TSH: TSH: 1.12 u[IU]/mL (ref 0.450–4.500)

## 2022-12-03 ENCOUNTER — Other Ambulatory Visit: Payer: Self-pay | Admitting: Cardiovascular Disease

## 2022-12-03 DIAGNOSIS — E782 Mixed hyperlipidemia: Secondary | ICD-10-CM

## 2022-12-07 ENCOUNTER — Other Ambulatory Visit: Payer: Medicare PPO

## 2022-12-10 ENCOUNTER — Other Ambulatory Visit: Payer: Medicare PPO

## 2022-12-11 ENCOUNTER — Other Ambulatory Visit: Payer: Medicare PPO

## 2022-12-11 DIAGNOSIS — E291 Testicular hypofunction: Secondary | ICD-10-CM | POA: Diagnosis not present

## 2022-12-12 ENCOUNTER — Encounter: Payer: Self-pay | Admitting: Urology

## 2022-12-12 LAB — TESTOSTERONE: Testosterone: 615 ng/dL (ref 264–916)

## 2022-12-13 DIAGNOSIS — M2022 Hallux rigidus, left foot: Secondary | ICD-10-CM | POA: Diagnosis not present

## 2022-12-13 DIAGNOSIS — M2021 Hallux rigidus, right foot: Secondary | ICD-10-CM | POA: Diagnosis not present

## 2022-12-17 ENCOUNTER — Ambulatory Visit (INDEPENDENT_AMBULATORY_CARE_PROVIDER_SITE_OTHER): Payer: Medicare PPO | Admitting: Psychiatry

## 2022-12-17 ENCOUNTER — Encounter: Payer: Self-pay | Admitting: Psychiatry

## 2022-12-17 VITALS — BP 131/70 | HR 91 | Temp 97.6°F | Ht 73.0 in | Wt 215.0 lb

## 2022-12-17 DIAGNOSIS — G4701 Insomnia due to medical condition: Secondary | ICD-10-CM

## 2022-12-17 DIAGNOSIS — F411 Generalized anxiety disorder: Secondary | ICD-10-CM | POA: Diagnosis not present

## 2022-12-17 DIAGNOSIS — F3342 Major depressive disorder, recurrent, in full remission: Secondary | ICD-10-CM

## 2022-12-17 MED ORDER — DULOXETINE HCL 20 MG PO CPEP: ORAL_CAPSULE | ORAL | 1 refills | Status: DC

## 2022-12-17 NOTE — Patient Instructions (Signed)
August Luz LCSW Phone - [phone]

## 2022-12-17 NOTE — Progress Notes (Signed)
[facility] OP Progress Note  12/17/2022 12:50 PM Kenneth Pruitt  MRN:  [id number]  Chief Complaint:  Chief Complaint  Patient presents with   Follow-up   Anxiety   Depression   Medication Refill   HPI: Kenneth Pruitt is a 72 year old Caucasian male, retired, married, lives in [place], has a history of MDD, GAD, insomnia, coronary artery disease, stent placement, knee pain, testosterone deficiency was evaluated in the office today.  Patient today reports he does have anxiety about his wife's health problems.  She has medical issues as well as possible memory problems.  Trying to get her connected with all the right providers.  That has been stressful.  Patient also reports he went through multiple surgeries since August 2023.  Had back surgery, bilateral foot surgery.  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  Patient reports he has been recovering well.  Patient reports he has started riding his bike as well as distracts himself by being occupied with several different hobbies including having a greenhouse, playing music, painting by numbers, woodwork.  Patient however reports he has had recent episodes when he had dreams about things that he may have done wrong while he was younger.  He hence is motivated to start psychotherapy.  Patient denies any suicidality, homicidality or perceptual disturbances.  Patient denies any other concerns today.  Visit Diagnosis:    ICD-10-CM   1. MDD (major depressive disorder), recurrent, in full remission  F33.42     2. GAD (generalized anxiety disorder)  F41.1 DULoxetine (CYMBALTA) 20 MG capsule    3. Insomnia due to medical condition  G47.01    pain      Past Psychiatric History: I have reviewed past psychiatric history from progress note on 12/16/2017  Past Medical History:  Past Medical History:  Diagnosis Date   Arthritis    Coronary artery disease    Depression    Hyperlipidemia    Skin cancer     left forearm treated years ago by Dr. Golden Pop    Past Surgical History:  Procedure Laterality Date   ANKLE FRACTURE SURGERY Right    ARTHRODESIS ANT INTERBODY INC DISCECTOMY, CERVICAL BELOW C2    05/2019   CARDIAC CATHETERIZATION N/A 04/26/2015   Procedure: Left Heart Cath;  Surgeon: Dionisio David, MD;  Location: [facility];  Service: Cardiovascular;  Laterality: N/A;   CLAVICLE SURGERY Right 05/04/2021   CORONARY ANGIOPLASTY     CORONARY STENT INTERVENTION N/A 03/27/2018   Procedure: CORONARY STENT INTERVENTION;  Surgeon: Yolonda Kida, MD;  Location: [facility];  Service: Cardiovascular;  Laterality: N/A;   EYE SURGERY Bilateral    cataract   FOOT SURGERY Left 08/22/2022   FOOT SURGERY Right 10/31/2022   LEFT HEART CATH AND CORONARY ANGIOGRAPHY Left 03/27/2018   Procedure: Left Heart Cath with possible coronary intervention;  Surgeon: Dionisio David, MD;  Location: [facility];  Service: Cardiovascular;  Laterality: Left;   LUMBAR FUSION  05/08/2022   TOTAL KNEE ARTHROPLASTY Right 10/08/2018   Procedure: TOTAL KNEE ARTHROPLASTY;  Surgeon: Lovell [name], MD;  Location: [facility];  Service: Orthopedics;  Laterality: Right;    Family Psychiatric History: Reviewed family psychiatric history from progress note on 12/16/2017  Family History:  Family History  Problem Relation Age of Onset   Diabetes Mother    Hypertension Mother    Cancer Father    Depression Father  Anxiety disorder Father    Depression Daughter     Social History: Reviewed social history from progress note on 12/16/2017 Social History   Socioeconomic History   Marital status: Married    Spouse name: ann   Number of children: 2   Years of education: Not on file   Highest education level: Master's degree (e.g., MA, MS, MEng, MEd, MSW, MBA)  Occupational History    Comment: retired  Tobacco Use   Smoking status: Former    Types: Cigars    Quit date: 07/18/2006     Years since quitting: 16.4   Smokeless tobacco: Never  Vaping Use   Vaping Use: Never used  Substance and Sexual Activity   Alcohol use: No   Drug use: No   Sexual activity: Not Currently  Other Topics Concern   Not on file  Social History Narrative   Not on file   Social Determinants of Health   Financial Resource Strain: Low Risk  (06/12/2022)   Overall Financial Resource Strain (CARDIA)    Difficulty of Paying Living Expenses: Not hard at all  Food Insecurity: No Food Insecurity (06/12/2022)   Hunger Vital Sign    Worried About Running Out of Food in the Last Year: Never true    [place] in the Last Year: Never true  Transportation Needs: No Transportation Needs (06/12/2022)   PRAPARE - Hydrologist (Medical): No    Lack of Transportation (Non-Medical): No  Physical Activity: Insufficiently Active (06/12/2022)   Exercise Vital Sign    Days of Exercise per Week: 3 days    Minutes of Exercise per Session: 30 min  Stress: No Stress Concern Present (06/12/2022)   [place]    Feeling of Stress : Not at all  Social Connections: Moderately Integrated (06/12/2022)   Social Connection and Isolation Panel [NHANES]    Frequency of Communication with Friends and Family: Twice a week    Frequency of Social Gatherings with Friends and Family: More than three times a week    Attends Religious Services: 1 to 4 times per year    Active Member of [employer] or Organizations: No    Attends Archivist Meetings: Never    Marital Status: Married    Allergies:  Allergies  Allergen Reactions   Clonidine Derivatives Shortness Of Breath and Other (See Comments)    Low heart rate   Clonidine     Mild bradycardia + Somnolence    Metabolic Disorder Labs: Lab Results  Component Value Date   HGBA1C 6.1 (H) 05/01/2022   No results found for: "PROLACTIN" Lab Results  Component Value Date    CHOL 89 (L) 11/26/2022   TRIG 193 (H) 11/26/2022   HDL 36 (L) 11/26/2022   CHOLHDL 2.5 08/20/2019   VLDL 37 03/28/2018   LDLCALC 22 11/26/2022   LDLCALC 29 03/01/2022   Lab Results  Component Value Date   TSH 1.120 11/26/2022   TSH 0.939 08/31/2021    Therapeutic Level Labs: No results found for: "LITHIUM" No results found for: "VALPROATE" No results found for: "CBMZ"  Current Medications: Current Outpatient Medications  Medication Sig Dispense Refill   acetaminophen (TYLENOL) 500 MG tablet Take by mouth.     amLODipine (NORVASC) 5 MG tablet Take by mouth.     aspirin 81 MG EC tablet Take 1 tablet (81 mg total) by mouth daily. 90 tablet 3   buPROPion (  WELLBUTRIN XL) 150 MG 24 hr tablet TAKE 1 TABLET BY MOUTH ONCE DAILY WITH BREAKFAST 90 tablet 1   chlorthalidone (HYGROTON) 25 MG tablet TAKE ONE TABLET BY MOUTH EVERY DAY 90 tablet 1   Cholecalciferol 125 MCG (5000 UT) capsule Take 5,000 Units by mouth daily.     clopidogrel (PLAVIX) 75 MG tablet Take 75 mg by mouth daily.     clotrimazole-betamethasone (LOTRISONE) cream Apply 1 application topically 2 (two) times daily. 30 g 0   diclofenac Sodium (VOLTAREN) 1 % GEL      DULoxetine (CYMBALTA) 60 MG capsule TAKE 1 CAPSULE BY MOUTH DAILY ALONG WITHTHE 20MG  FOR A TOTAL OF 80MG  90 capsule 1   dutasteride (AVODART) 0.5 MG capsule TAKE 1 CAPSULE BY MOUTH EVERY DAY IN THEEVENING 90 capsule 1   fluticasone (FLONASE) 50 MCG/ACT nasal spray Place 2 sprays into both nostrils daily. 16 g 12   hydrALAZINE (APRESOLINE) 50 MG tablet Take 25 mg by mouth 2 (two) times daily.     hydrocortisone (ANUSOL-HC) 2.5 % rectal cream APPLY RECTALLY TWICE DAILY AS DIRECTED 30 g 0   hydrOXYzine (ATARAX) 25 MG tablet Take 25 mg by mouth every 8 (eight) hours as needed.     icosapent Ethyl (VASCEPA) 1 g capsule TAKE 2 CAPSULES BY MOUTH TWICE DAILY 360 capsule 0   losartan (COZAAR) 50 MG tablet Take 1 tablet (50 mg total) by mouth daily. 90 tablet 1    mupirocin ointment (BACTROBAN) 2 % APPLY 1 APPLICATION TOPICALLY 2 TIMES DAILY 22 g 0   niacin (NIASPAN) 1000 MG CR tablet Take 2 tablets (2,000 mg total) by mouth at bedtime. 180 tablet 2   pantoprazole (PROTONIX) 20 MG tablet Take 20 mg by mouth daily.      rosuvastatin (CRESTOR) 40 MG tablet Take 1 tablet (40 mg total) by mouth every evening. 90 tablet 1   tamsulosin (FLOMAX) 0.4 MG CAPS capsule Take 1 capsule (0.4 mg total) by mouth daily. 90 capsule 1   testosterone cypionate (DEPOTESTOSTERONE CYPIONATE) 200 MG/ML injection INJECT 0.75 ML INTRAMUSCULARLY EVERY 10 DAYS AS DIRECTED 10 mL 0   vitamin B-12 (CYANOCOBALAMIN) 1000 MCG tablet Take 1,000 mcg by mouth daily.     DULoxetine (CYMBALTA) 20 MG capsule Take 20 mg daily along with 60 mg - total of 80 mg . 90 capsule 1   No current facility-administered medications for this visit.     Musculoskeletal: Strength & Muscle Tone: within normal limits Gait & Station: normal Patient leans: N/A  Psychiatric Specialty Exam: Review of Systems  Musculoskeletal:        Rt.Foot pain - surgical  Psychiatric/Behavioral:  Positive for sleep disturbance. The patient is nervous/anxious.   All other systems reviewed and are negative.   Blood pressure 131/70, pulse 91, temperature 97.6 F (36.4 C), temperature source Skin, height 6\' 1"  (1.854 m), weight 215 lb (97.5 kg).Body mass index is 28.37 kg/m.  General Appearance: Casual  Eye Contact:  Fair  Speech:  Clear and Coherent  Volume:  Normal  Mood:  Anxious  Affect:  Appropriate  Thought Process:  Goal Directed and Descriptions of Associations: Intact  Orientation:  Full (Time, Place, and Person)  Thought Content: WDL   Suicidal Thoughts:  No  Homicidal Thoughts:  No  Memory:  Immediate;   Fair Recent;   Fair Remote;   Fair  Judgement:  Fair  Insight:  Fair  Psychomotor Activity:  Normal  Concentration:  Concentration: Fair and Attention Span: Fair  Recall:  Smiley Houseman of Knowledge:  Fair  Language: Fair  Akathisia:  No  Handed:  Right  AIMS (if indicated): not done  Assets:  Communication Skills Desire for Improvement Housing Social Support  ADL's:  Intact  Cognition: WNL  Sleep:   restless past two days, improving   Screenings: [place] Office Visit from 04/09/2022 in [place] Office Visit from 02/06/2022 in [place] Total Score 0 0      GAD-7    [place] Office Visit from 12/17/2022 in [place] Office Visit from 07/16/2022 in [place] Office Visit from 06/11/2022 in [place] Office Visit from 05/01/2022 in [place] Office Visit from 04/09/2022 in [place]  Total GAD-7 Score 5 3 7 7 4       PHQ2-9    [place] Office Visit from 12/17/2022 in [place] Office Visit from 07/16/2022 in [place] from 06/12/2022 in [place] Office Visit from 06/11/2022 in [place] Office Visit from 05/01/2022 in [place]  PHQ-2 Total Score 2 2 0 2 2  PHQ-9 Total Score 12 5 0 8 [place] Visit from 12/17/2022 in [place] Office Visit from 07/16/2022 in [place] Office Visit from 02/06/2022 in [place] No Risk No Risk No Risk        Assessment and Plan: Kenneth Pruitt is a 72 year old Caucasian male, has a history of MDD, GAD, multiple medical problems including hyperlipidemia, coronary artery disease status post stent placement, chronic  pain, BPH was evaluated in office today.  Patient with multiple situational stressors, anxiety due to the same, will benefit from following plan.  Plan MDD in remission Cymbalta 80 mg p.o. daily Wellbutrin XL 150 mg p.o. daily  GAD-unstable Patient to establish care with therapist Mr. Bob Millan-provided resources again. Cymbalta 80 mg p.o. daily   Insomnia-improving Patient to work on sleep hygiene. Continue pain management  Follow-up in clinic in 4 months or sooner if needed.   Consent: Patient/Guardian gives verbal consent for treatment and assignment of benefits for services provided during this visit. Patient/Guardian expressed understanding and agreed to proceed.   This note was generated in part or whole with voice recognition software. Voice recognition is usually quite accurate but there are transcription errors that can and very often do occur. I apologize for any typographical errors that were not detected and corrected.    Ursula Alert, MD 12/17/2022, 12:50 PM

## 2023-01-07 ENCOUNTER — Ambulatory Visit: Payer: Medicare PPO | Admitting: Cardiovascular Disease

## 2023-01-07 ENCOUNTER — Encounter: Payer: Self-pay | Admitting: Cardiovascular Disease

## 2023-01-07 VITALS — BP 120/70 | HR 96 | Ht 73.0 in | Wt 213.2 lb

## 2023-01-07 DIAGNOSIS — I2510 Atherosclerotic heart disease of native coronary artery without angina pectoris: Secondary | ICD-10-CM | POA: Diagnosis not present

## 2023-01-07 DIAGNOSIS — G4733 Obstructive sleep apnea (adult) (pediatric): Secondary | ICD-10-CM | POA: Diagnosis not present

## 2023-01-07 DIAGNOSIS — I10 Essential (primary) hypertension: Secondary | ICD-10-CM | POA: Diagnosis not present

## 2023-01-07 DIAGNOSIS — E782 Mixed hyperlipidemia: Secondary | ICD-10-CM | POA: Diagnosis not present

## 2023-01-07 MED ORDER — HYDRALAZINE HCL 50 MG PO TABS: 50.0000 mg | ORAL_TABLET | Freq: Two times a day (BID) | ORAL | 2 refills | Status: DC

## 2023-01-07 NOTE — Progress Notes (Addendum)
Cardiology Office Note   Date:  01/07/2023   ID:  Kenneth Pruitt Apr 22, 1951, MRN [id number]  PCP:  Dorcas Carrow, DO  Cardiologist:  Adrian Blackwater, MD      History of Present Illness: Kenneth Pruitt is a 72 y.o. male who presents for  Chief Complaint  Patient presents with   Follow-up    2 month follow up    Patient in office for 2 month follow up. Denies chest pain, shortness of breath. Patient complains of feeling tired.     Past Medical History:  Diagnosis Date   Arthritis    Coronary artery disease    Depression    Hyperlipidemia    Skin cancer    left forearm treated years ago by Dr. Vonita Moss     Past Surgical History:  Procedure Laterality Date   ANKLE FRACTURE SURGERY Right    ARTHRODESIS ANT INTERBODY INC DISCECTOMY, CERVICAL BELOW C2    05/2019   CARDIAC CATHETERIZATION N/A 04/26/2015   Procedure: Left Heart Cath;  Surgeon: Laurier Nancy, MD;  Location: [facility];  Service: Cardiovascular;  Laterality: N/A;   CLAVICLE SURGERY Right 05/04/2021   CORONARY ANGIOPLASTY     CORONARY STENT INTERVENTION N/A 03/27/2018   Procedure: CORONARY STENT INTERVENTION;  Surgeon: Alwyn Pea, MD;  Location: [facility];  Service: Cardiovascular;  Laterality: N/A;   EYE SURGERY Bilateral    cataract   FOOT SURGERY Left 08/22/2022   FOOT SURGERY Right 10/31/2022   LEFT HEART CATH AND CORONARY ANGIOGRAPHY Left 03/27/2018   Procedure: Left Heart Cath with possible coronary intervention;  Surgeon: Laurier Nancy, MD;  Location: [facility];  Service: Cardiovascular;  Laterality: Left;   LUMBAR FUSION  05/08/2022   TOTAL KNEE ARTHROPLASTY Right 10/08/2018   Procedure: TOTAL KNEE ARTHROPLASTY;  Surgeon: Lyndle Herrlich, MD;  Location: [facility];  Service: Orthopedics;  Laterality: Right;     Current Outpatient Medications  Medication Sig Dispense Refill   acetaminophen (TYLENOL) 500 MG tablet Take by mouth.      amLODipine (NORVASC) 5 MG tablet Take by mouth.     aspirin 81 MG EC tablet Take 1 tablet (81 mg total) by mouth daily. 90 tablet 3   buPROPion (WELLBUTRIN XL) 150 MG 24 hr tablet TAKE 1 TABLET BY MOUTH ONCE DAILY WITH BREAKFAST 90 tablet 1   chlorthalidone (HYGROTON) 25 MG tablet TAKE ONE TABLET BY MOUTH EVERY DAY 90 tablet 1   Cholecalciferol 125 MCG (5000 UT) capsule Take 5,000 Units by mouth daily.     clopidogrel (PLAVIX) 75 MG tablet Take 75 mg by mouth daily.     clotrimazole-betamethasone (LOTRISONE) cream Apply 1 application topically 2 (two) times daily. 30 g 0   diclofenac Sodium (VOLTAREN) 1 % GEL      DULoxetine (CYMBALTA) 20 MG capsule Take 20 mg daily along with 60 mg - total of 80 mg . 90 capsule 1   DULoxetine (CYMBALTA) 60 MG capsule TAKE 1 CAPSULE BY MOUTH DAILY ALONG WITHTHE  FOR A TOTAL OF  90 capsule 1   dutasteride (AVODART) 0.5 MG capsule TAKE 1 CAPSULE BY MOUTH EVERY DAY IN THEEVENING 90 capsule 1   fluticasone (FLONASE) 50 MCG/ACT nasal spray Place 2 sprays into both nostrils daily. 16 g 12   hydrocortisone (ANUSOL-HC) 2.5 % rectal cream APPLY RECTALLY TWICE DAILY AS DIRECTED 30 g 0   hydrOXYzine (ATARAX) 25 MG tablet Take 25  mg by mouth every 8 (eight) hours as needed.     icosapent Ethyl (VASCEPA) 1 g capsule TAKE 2 CAPSULES BY MOUTH TWICE DAILY 360 capsule 0   losartan (COZAAR) 50 MG tablet Take 1 tablet (50 mg total) by mouth daily. 90 tablet 1   mupirocin ointment (BACTROBAN) 2 % APPLY 1 APPLICATION TOPICALLY 2 TIMES DAILY 22 g 0   niacin (NIASPAN) 1000 MG CR tablet Take 2 tablets (2,000 mg total) by mouth at bedtime. 180 tablet 2   pantoprazole (PROTONIX) 20 MG tablet Take 20 mg by mouth daily.      rosuvastatin (CRESTOR) 40 MG tablet Take 1 tablet (40 mg total) by mouth every evening. 90 tablet 1   tamsulosin (FLOMAX) 0.4 MG CAPS capsule Take 1 capsule (0.4 mg total) by mouth daily. 90 capsule 1   testosterone cypionate (DEPOTESTOSTERONE CYPIONATE) 200  MG/ML injection INJECT 0.75 ML INTRAMUSCULARLY EVERY 10 DAYS AS DIRECTED 10 mL 0   vitamin B-12 (CYANOCOBALAMIN) 1000 MCG tablet Take 1,000 mcg by mouth daily.     hydrALAZINE (APRESOLINE) 50 MG tablet Take 1 tablet (50 mg total) by mouth 2 (two) times daily. 120 tablet 2   No current facility-administered medications for this visit.    Allergies:   Clonidine derivatives and Clonidine    Social History:   reports that he quit smoking about 16 years ago. His smoking use included cigars. He has never used smokeless tobacco. He reports that he does not drink alcohol and does not use drugs.   Family History:  family history includes Anxiety disorder in his father; Cancer in his father; Depression in his daughter and father; Diabetes in his mother; Hypertension in his mother.    ROS:     Review of Systems  Constitutional:  Positive for malaise/fatigue.  HENT: Negative.    Eyes: Negative.   Respiratory: Negative.    Cardiovascular: Negative.   Gastrointestinal: Negative.   Genitourinary: Negative.   Musculoskeletal: Negative.   Skin: Negative.   Neurological: Negative.   Endo/Heme/Allergies: Negative.   Psychiatric/Behavioral: Negative.    All other systems reviewed and are negative.   All other systems are reviewed and negative.   PHYSICAL EXAM: VS:  BP 120/70 (BP Location: Right Arm, Patient Position: Sitting, Cuff Size: Small)   Pulse 96   Ht 6\' 1"  (1.854 m)   Wt 213 lb 3.2 oz (96.7 kg)   SpO2 97%   BMI 28.13 kg/m  , BMI Body mass index is 28.13 kg/m. Last weight:  Wt Readings from Last 3 Encounters:  01/07/23 213 lb 3.2 oz (96.7 kg)  11/26/22 215 lb 6.4 oz (97.7 kg)  11/06/22 214 lb 6.4 oz (97.3 kg)     Physical Exam Vitals reviewed.  Constitutional:      Appearance: Normal appearance. He is normal weight.  HENT:     Head: Normocephalic.     Nose: Nose normal.     Mouth/Throat:     Mouth: Mucous membranes are moist.  Eyes:     Pupils: Pupils are equal, round,  and reactive to light.  Cardiovascular:     Rate and Rhythm: Normal rate and regular rhythm.     Pulses: Normal pulses.     Heart sounds: Normal heart sounds.  Pulmonary:     Effort: Pulmonary effort is normal.  Abdominal:     General: Abdomen is flat. Bowel sounds are normal.  Musculoskeletal:        General: Normal range of motion.  Cervical back: Normal range of motion.  Skin:    General: Skin is warm.  Neurological:     General: No focal deficit present.     Mental Status: He is alert.  Psychiatric:        Mood and Affect: Mood normal.     EKG: none today  Recent Labs: 11/26/2022: ALT 17; BUN 14; Creatinine, Ser 1.27; Hemoglobin 16.4; Platelets 226; Potassium 3.7; Sodium 140; TSH 1.120    Lipid Panel    Component Value Date/Time   CHOL 89 (L) 11/26/2022 1443   CHOL 115 08/06/2016 0954   TRIG 193 (H) 11/26/2022 1443   TRIG 188 (H) 08/06/2016 0954   HDL 36 (L) 11/26/2022 1443   CHOLHDL 2.5 08/20/2019 0836   CHOLHDL 3.3 03/28/2018 0854   VLDL 37 03/28/2018 0854   VLDL 38 (H) 08/06/2016 0954   LDLCALC 22 11/26/2022 1443    Other studies Reviewed: none today   ASSESSMENT AND PLAN:    ICD-10-CM   1. Coronary artery disease involving native coronary artery of native heart without angina pectoris  I25.10     2. Essential hypertension  I10     3. Mixed hyperlipidemia  E78.2     4. OSA on CPAP  G47.33        Problem List Items Addressed This Visit       Cardiovascular and Mediastinum   Essential hypertension    Better controlled on recheck.       Relevant Medications   hydrALAZINE (APRESOLINE) 50 MG tablet   CAD (coronary artery disease) - Primary    Patient denies chest pain. Only complaint is feeling tired. Has not been able to exercise due to recent foot surgery.       Relevant Medications   hydrALAZINE (APRESOLINE) 50 MG tablet     Respiratory   OSA on CPAP    Patient using and benefiting from CPAP machine. Patient's current machine is  almost 72 years old, will need replaced in May 2024.         Other   Hyperlipidemia    11/26/22 LDL 22.       Relevant Medications   hydrALAZINE (APRESOLINE) 50 MG tablet     Disposition:   Return in about 3 months (around 04/08/2023).    Total time spent: 30 minutes  Signed,  Adrian Blackwater, MD  01/07/2023 10:22 AM    [facility]

## 2023-01-07 NOTE — Assessment & Plan Note (Signed)
11/26/22 LDL 22.

## 2023-01-07 NOTE — Assessment & Plan Note (Signed)
Better controlled on recheck.

## 2023-01-07 NOTE — Assessment & Plan Note (Signed)
Patient using and benefiting from CPAP machine. Patient's current machine is almost 72 years old, will need replaced in May 2024.

## 2023-01-07 NOTE — Assessment & Plan Note (Signed)
Patient denies chest pain. Only complaint is feeling tired. Has not been able to exercise due to recent foot surgery.

## 2023-01-10 DIAGNOSIS — M2021 Hallux rigidus, right foot: Secondary | ICD-10-CM | POA: Diagnosis not present

## 2023-01-10 DIAGNOSIS — M2022 Hallux rigidus, left foot: Secondary | ICD-10-CM | POA: Diagnosis not present

## 2023-02-01 ENCOUNTER — Other Ambulatory Visit: Payer: Self-pay | Admitting: Cardiovascular Disease

## 2023-02-18 ENCOUNTER — Other Ambulatory Visit: Payer: Self-pay | Admitting: Cardiovascular Disease

## 2023-02-18 ENCOUNTER — Other Ambulatory Visit: Payer: Self-pay | Admitting: Urology

## 2023-02-18 DIAGNOSIS — E291 Testicular hypofunction: Secondary | ICD-10-CM

## 2023-02-28 DIAGNOSIS — Z135 Encounter for screening for eye and ear disorders: Secondary | ICD-10-CM | POA: Diagnosis not present

## 2023-02-28 DIAGNOSIS — H26491 Other secondary cataract, right eye: Secondary | ICD-10-CM | POA: Diagnosis not present

## 2023-02-28 DIAGNOSIS — Z961 Presence of intraocular lens: Secondary | ICD-10-CM | POA: Diagnosis not present

## 2023-02-28 DIAGNOSIS — H5213 Myopia, bilateral: Secondary | ICD-10-CM | POA: Diagnosis not present

## 2023-02-28 DIAGNOSIS — H52223 Regular astigmatism, bilateral: Secondary | ICD-10-CM | POA: Diagnosis not present

## 2023-02-28 DIAGNOSIS — H524 Presbyopia: Secondary | ICD-10-CM | POA: Diagnosis not present

## 2023-03-04 ENCOUNTER — Other Ambulatory Visit: Payer: Self-pay

## 2023-03-04 DIAGNOSIS — D751 Secondary polycythemia: Secondary | ICD-10-CM

## 2023-03-05 ENCOUNTER — Inpatient Hospital Stay: Payer: Medicare PPO

## 2023-03-05 ENCOUNTER — Encounter: Payer: Self-pay | Admitting: Nurse Practitioner

## 2023-03-05 ENCOUNTER — Inpatient Hospital Stay: Payer: Medicare PPO | Attending: Nurse Practitioner | Admitting: Nurse Practitioner

## 2023-03-05 VITALS — BP 123/77 | HR 69 | Temp 96.6°F | Wt 215.0 lb

## 2023-03-05 DIAGNOSIS — Z87891 Personal history of nicotine dependence: Secondary | ICD-10-CM | POA: Diagnosis not present

## 2023-03-05 DIAGNOSIS — I10 Essential (primary) hypertension: Secondary | ICD-10-CM | POA: Diagnosis not present

## 2023-03-05 DIAGNOSIS — D751 Secondary polycythemia: Secondary | ICD-10-CM | POA: Diagnosis not present

## 2023-03-05 LAB — CBC WITH DIFFERENTIAL/PLATELET
Abs Immature Granulocytes: 0 10*3/uL (ref 0.00–0.07)
Basophils Absolute: 0 10*3/uL (ref 0.0–0.1)
Basophils Relative: 1 %
Eosinophils Absolute: 0.1 10*3/uL (ref 0.0–0.5)
Eosinophils Relative: 2 %
HCT: 41.4 % (ref 39.0–52.0)
Hemoglobin: 13.8 g/dL (ref 13.0–17.0)
Immature Granulocytes: 0 %
Lymphocytes Relative: 29 %
Lymphs Abs: 1.1 10*3/uL (ref 0.7–4.0)
MCH: 28.9 pg (ref 26.0–34.0)
MCHC: 33.3 g/dL (ref 30.0–36.0)
MCV: 86.8 fL (ref 80.0–100.0)
Monocytes Absolute: 0.6 10*3/uL (ref 0.1–1.0)
Monocytes Relative: 15 %
Neutro Abs: 2 10*3/uL (ref 1.7–7.7)
Neutrophils Relative %: 53 %
Platelets: 206 10*3/uL (ref 150–400)
RBC: 4.77 MIL/uL (ref 4.22–5.81)
RDW: 16 % — ABNORMAL HIGH (ref 11.5–15.5)
WBC: 3.8 10*3/uL — ABNORMAL LOW (ref 4.0–10.5)
nRBC: 0 % (ref 0.0–0.2)

## 2023-03-05 NOTE — Progress Notes (Signed)
[facility]  [phone] [phone]  ID: Cheri Guppy OB: 10/22/1950  MR#: [id number]  AOZ#:[id number]  Patient Care Team: Dorcas Carrow, DO as PCP - General (Family Medicine) Willeen Niece, MD (Dermatology) Deeann Saint, MD (Specialist) Laurier Nancy, MD as Consulting Physician (Cardiology) Orland Jarred, Blanchie Serve (Inactive) as Attending Physician (Podiatry) Jeralyn Ruths, MD as Consulting Physician (Oncology) Jeralyn Ruths, MD as Consulting Physician (Oncology)  CHIEF COMPLAINT: Secondary polycythemia  INTERVAL HISTORY: Patient returns to clinic today for repeat laboratory work, further evaluation, and consideration of additional phlebotomy. He feels well. Cycled 40 miles yesterday. Recovered from his back and foot surgeries. Planning to travel to [place] later this year. Continues to enjoy retirement. He has no neurologic complaints.  He denies any recent fevers or illnesses. He has a good appetite and denies weight loss.  He denies any chest pain, shortness of breath, cough, or hemoptysis.  He denies any nausea, vomiting, constipation, or diarrhea. He has no urinary complaints.  Patient offers no specific complaints today.  REVIEW OF SYSTEMS:   Review of Systems  Constitutional: Negative.  Negative for fever, malaise/fatigue and weight loss.  Respiratory: Negative.  Negative for cough and shortness of breath.   Cardiovascular: Negative.  Negative for chest pain and leg swelling.  Gastrointestinal: Negative.  Negative for abdominal pain, blood in stool and melena.  Genitourinary: Negative.  Negative for dysuria.  Musculoskeletal: Negative.  Negative for back pain, joint pain and neck pain.  Skin: Negative.  Negative for rash.  Neurological: Negative.  Negative for dizziness, tingling, sensory change, focal weakness, weakness and headaches.  Psychiatric/Behavioral: Negative.  The patient is not nervous/anxious.   As per HPI.  Otherwise, a complete review of systems is negative.  PAST MEDICAL HISTORY: Past Medical History:  Diagnosis Date   Arthritis    Coronary artery disease    Depression    Hyperlipidemia    Skin cancer    left forearm treated years ago by Dr. Vonita Moss    PAST SURGICAL HISTORY: Past Surgical History:  Procedure Laterality Date   ANKLE FRACTURE SURGERY Right    ARTHRODESIS ANT INTERBODY INC DISCECTOMY, CERVICAL BELOW C2    05/2019   CARDIAC CATHETERIZATION N/A 04/26/2015   Procedure: Left Heart Cath;  Surgeon: Laurier Nancy, MD;  Location: [facility] INVASIVE CV LAB;  Service: Cardiovascular;  Laterality: N/A;   CLAVICLE SURGERY Right 05/04/2021   CORONARY ANGIOPLASTY     CORONARY STENT INTERVENTION N/A 03/27/2018   Procedure: CORONARY STENT INTERVENTION;  Surgeon: Alwyn Pea, MD;  Location: [facility] INVASIVE CV LAB;  Service: Cardiovascular;  Laterality: N/A;   EYE SURGERY Bilateral    cataract   FOOT SURGERY Left 08/22/2022   FOOT SURGERY Right 10/31/2022   LEFT HEART CATH AND CORONARY ANGIOGRAPHY Left 03/27/2018   Procedure: Left Heart Cath with possible coronary intervention;  Surgeon: Laurier Nancy, MD;  Location: [facility] INVASIVE CV LAB;  Service: Cardiovascular;  Laterality: Left;   LUMBAR FUSION  05/08/2022   TOTAL KNEE ARTHROPLASTY Right 10/08/2018   Procedure: TOTAL KNEE ARTHROPLASTY;  Surgeon: Lyndle Herrlich, MD;  Location: [facility] ORS;  Service: Orthopedics;  Laterality: Right;    FAMILY HISTORY: Family History  Problem Relation Age of Onset   Diabetes Mother    Hypertension Mother    Cancer Father    Depression Father    Anxiety disorder Father    Depression Daughter     ADVANCED DIRECTIVES (Y/N):  N  HEALTH  MAINTENANCE: Social History   Tobacco Use   Smoking status: Former    Types: Cigars    Quit date: 07/18/2006    Years since quitting: 16.6   Smokeless tobacco: Never  Vaping Use   Vaping Use: Never used  Substance Use Topics   Alcohol use: No    Drug use: No  Loves dogs. Has 2 shiloh shepherds. One named Artist.     Colonoscopy:  Bone density:  Lipid panel:  Allergies  Allergen Reactions   Clonidine Derivatives Shortness Of Breath and Other (See Comments)    Low heart rate   Clonidine     Mild bradycardia + Somnolence    Current Outpatient Medications  Medication Sig Dispense Refill   acetaminophen (TYLENOL) 500 MG tablet Take by mouth.     amLODipine (NORVASC) 5 MG tablet Take by mouth.     aspirin 81 MG EC tablet Take 1 tablet (81 mg total) by mouth daily. 90 tablet 3   buPROPion (WELLBUTRIN XL) 150 MG 24 hr tablet TAKE 1 TABLET BY MOUTH ONCE DAILY WITH BREAKFAST 90 tablet 1   chlorthalidone (HYGROTON) 25 MG tablet TAKE 1 TABLET BY MOUTH DAILY 90 tablet 0   Cholecalciferol 125 MCG (5000 UT) capsule Take 5,000 Units by mouth daily.     clopidogrel (PLAVIX) 75 MG tablet TAKE 1 TABLET BY MOUTH DAILY 90 tablet 0   clotrimazole-betamethasone (LOTRISONE) cream Apply 1 application topically 2 (two) times daily. 30 g 0   diclofenac Sodium (VOLTAREN) 1 % GEL      DULoxetine (CYMBALTA) 20 MG capsule Take 20 mg daily along with 60 mg - total of 80 mg . 90 capsule 1   DULoxetine (CYMBALTA) 60 MG capsule TAKE 1 CAPSULE BY MOUTH DAILY ALONG WITHTHE 20MG  FOR A TOTAL OF 80MG  90 capsule 1   dutasteride (AVODART) 0.5 MG capsule TAKE 1 CAPSULE BY MOUTH EVERY DAY IN THEEVENING 90 capsule 1   fluticasone (FLONASE) 50 MCG/ACT nasal spray Place 2 sprays into both nostrils daily. 16 g 12   hydrALAZINE (APRESOLINE) 50 MG tablet Take 1 tablet (50 mg total) by mouth 2 (two) times daily. 120 tablet 2   hydrocortisone (ANUSOL-HC) 2.5 % rectal cream APPLY RECTALLY TWICE DAILY AS DIRECTED 30 g 0   hydrOXYzine (ATARAX) 25 MG tablet Take 25 mg by mouth every 8 (eight) hours as needed.     icosapent Ethyl (VASCEPA) 1 g capsule TAKE 2 CAPSULES BY MOUTH TWICE DAILY 360 capsule 0   losartan (COZAAR) 50 MG tablet Take 1 tablet (50 mg total) by mouth  daily. 90 tablet 1   mupirocin ointment (BACTROBAN) 2 % APPLY 1 APPLICATION TOPICALLY 2 TIMES DAILY 22 g 0   niacin (NIASPAN) 1000 MG CR tablet Take 2 tablets (2,000 mg total) by mouth at bedtime. 180 tablet 2   pantoprazole (PROTONIX) 20 MG tablet Take 20 mg by mouth daily.      rosuvastatin (CRESTOR) 40 MG tablet Take 1 tablet (40 mg total) by mouth every evening. 90 tablet 1   tamsulosin (FLOMAX) 0.4 MG CAPS capsule Take 1 capsule (0.4 mg total) by mouth daily. 90 capsule 1   testosterone cypionate (DEPOTESTOSTERONE CYPIONATE) 200 MG/ML injection INJECT 0.75 ML INTRAMUSCULARLY EVERY 10 DAYS AS DIRECTED 10 mL 0   vitamin B-12 (CYANOCOBALAMIN) 1000 MCG tablet Take 1,000 mcg by mouth daily.     No current facility-administered medications for this visit.    OBJECTIVE: Vitals:   03/05/23 1305  BP: 123/77  Pulse:  69  Temp: (!) 96.6 F (35.9 C)  SpO2: 99%     Body mass index is 28.37 kg/m.    ECOG FS:0 - Asymptomatic  General: Well-developed, well-nourished, no acute distress. Lungs: No audible wheezing or coughing Musculoskeletal: No edema, cyanosis, or clubbing.  Neuro: Alert, answering all questions appropriately. Skin: No rashes or petechiae noted.  Psych: Normal affect.  LAB RESULTS: Lab Results  Component Value Date   WBC 3.8 (L) 03/05/2023   NEUTROABS 2.0 03/05/2023   HGB 13.8 03/05/2023   HCT 41.4 03/05/2023   MCV 86.8 03/05/2023   PLT 206 03/05/2023   STUDIES: No results found.  ASSESSMENT: Secondary polycythemia  PLAN:    1. 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  Patient last had phlebotomy in July 2021. Previously, it was agreed upon the goal hemoglobin will be between 17.0 and 17.5. Hemoglobin remains normal and no phlebotomy indicated today. Patient expressed understanding that as long as he requires testosterone, he likely will require periodic phlebotomies.  Return to clinic in 6 months with repeat laboratory and continuation of phlebotomy  if needed. I will plan to see him back in one year.   2. Low testosterone: Chronic and unchanged. Continue to treatment with testosterone per urology.  3.  Hypertension: Blood pressure is within normal limits today.  Disposition 6 mo- lab (cbc), poss phlebotomy 1 year- lab (cbc), see me, poss phlebotomy- la  Patient expressed understanding and was in agreement with this plan. He also understands that He can call clinic at any time with any questions, concerns, or complaints.   Thank you for allowing me to participate in the care of this very pleasant patient.   Alinda Dooms, NP   03/05/2023

## 2023-03-07 ENCOUNTER — Other Ambulatory Visit: Payer: Self-pay | Admitting: Cardiovascular Disease

## 2023-03-07 DIAGNOSIS — E782 Mixed hyperlipidemia: Secondary | ICD-10-CM

## 2023-03-07 DIAGNOSIS — Z1211 Encounter for screening for malignant neoplasm of colon: Secondary | ICD-10-CM | POA: Diagnosis not present

## 2023-03-11 LAB — COLOGUARD: COLOGUARD: NEGATIVE

## 2023-04-05 ENCOUNTER — Other Ambulatory Visit: Payer: Self-pay | Admitting: Psychiatry

## 2023-04-05 DIAGNOSIS — F3342 Major depressive disorder, recurrent, in full remission: Secondary | ICD-10-CM

## 2023-04-08 ENCOUNTER — Ambulatory Visit: Payer: Medicare PPO | Admitting: Cardiology

## 2023-04-08 ENCOUNTER — Encounter: Payer: Self-pay | Admitting: Cardiology

## 2023-04-08 VITALS — BP 135/70 | HR 84 | Ht 73.0 in | Wt 215.2 lb

## 2023-04-08 DIAGNOSIS — E782 Mixed hyperlipidemia: Secondary | ICD-10-CM

## 2023-04-08 DIAGNOSIS — I2510 Atherosclerotic heart disease of native coronary artery without angina pectoris: Secondary | ICD-10-CM | POA: Diagnosis not present

## 2023-04-08 DIAGNOSIS — I10 Essential (primary) hypertension: Secondary | ICD-10-CM | POA: Diagnosis not present

## 2023-04-08 NOTE — Assessment & Plan Note (Addendum)
Well controlled. Continue same regimen. Last echo 08/2021, will repeat prior to  next visit.

## 2023-04-08 NOTE — Progress Notes (Signed)
Cardiology Office Note   Date:  04/08/2023   ID:  [name], [name] [birth year], MRN [id number]  PCP:  Dorcas Carrow, DO  Cardiologist:  Marisue Ivan, NP      History of Present Illness: Kenneth Pruitt is a 72 y.o. male who presents for  Chief Complaint  Patient presents with   Follow-up    3 mo F/U    Patient in office for routine cardiac exam. Denies chest pain, shortness of breath, dizziness, lower extremity edema. Patient doing well. Still riding his bike long distances when weather permits.     Past Medical History:  Diagnosis Date   Arthritis    Coronary artery disease    Depression    Hyperlipidemia    Skin cancer    left forearm treated years ago by Dr. Vonita Moss     Past Surgical History:  Procedure Laterality Date   ANKLE FRACTURE SURGERY Right    ARTHRODESIS ANT INTERBODY INC DISCECTOMY, CERVICAL BELOW C2    05/2019   CARDIAC CATHETERIZATION N/A 04/26/2015   Procedure: Left Heart Cath;  Surgeon: Laurier Nancy, MD;  Location: [facility] INVASIVE CV LAB;  Service: Cardiovascular;  Laterality: N/A;   CLAVICLE SURGERY Right 05/04/2021   CORONARY ANGIOPLASTY     CORONARY STENT INTERVENTION N/A 03/27/2018   Procedure: CORONARY STENT INTERVENTION;  Surgeon: Alwyn Pea, MD;  Location: [facility] INVASIVE CV LAB;  Service: Cardiovascular;  Laterality: N/A;   EYE SURGERY Bilateral    cataract   FOOT SURGERY Left 08/22/2022   FOOT SURGERY Right 10/31/2022   LEFT HEART CATH AND CORONARY ANGIOGRAPHY Left 03/27/2018   Procedure: Left Heart Cath with possible coronary intervention;  Surgeon: Laurier Nancy, MD;  Location: [facility] INVASIVE CV LAB;  Service: Cardiovascular;  Laterality: Left;   LUMBAR FUSION  05/08/2022   TOTAL KNEE ARTHROPLASTY Right 10/08/2018   Procedure: TOTAL KNEE ARTHROPLASTY;  Surgeon: Lyndle Herrlich, MD;  Location: [facility] ORS;  Service: Orthopedics;  Laterality: Right;     Current Outpatient Medications  Medication Sig Dispense  Refill   acetaminophen (TYLENOL) 500 MG tablet Take by mouth.     amLODipine (NORVASC) 5 MG tablet Take by mouth.     aspirin 81 MG EC tablet Take 1 tablet (81 mg total) by mouth daily. 90 tablet 3   buPROPion (WELLBUTRIN XL) 150 MG 24 hr tablet TAKE 1 TABLET BY MOUTH ONCE DAILY WITH BREAKFAST 90 tablet 1   chlorthalidone (HYGROTON) 25 MG tablet TAKE 1 TABLET BY MOUTH DAILY 90 tablet 0   Cholecalciferol 125 MCG (5000 UT) capsule Take 5,000 Units by mouth daily.     clopidogrel (PLAVIX) 75 MG tablet TAKE 1 TABLET BY MOUTH DAILY 90 tablet 0   clotrimazole-betamethasone (LOTRISONE) cream Apply 1 application topically 2 (two) times daily. 30 g 0   diclofenac Sodium (VOLTAREN) 1 % GEL      DULoxetine (CYMBALTA) 20 MG capsule Take 20 mg daily along with 60 mg - total of 80 mg . 90 capsule 1   DULoxetine (CYMBALTA) 60 MG capsule TAKE 1 CAPSULE BY MOUTH DAILY ALONG WITHTHE 20MG  FOR A TOTAL OF 80MG  90 capsule 1   dutasteride (AVODART) 0.5 MG capsule TAKE 1 CAPSULE BY MOUTH EVERY DAY IN THEEVENING 90 capsule 1   fluticasone (FLONASE) 50 MCG/ACT nasal spray Place 2 sprays into both nostrils daily. 16 g 12   hydrALAZINE (APRESOLINE) 50 MG tablet Take 1 tablet (50 mg total) by mouth 2 (two)  times daily. 120 tablet 2   hydrocortisone (ANUSOL-HC) 2.5 % rectal cream APPLY RECTALLY TWICE DAILY AS DIRECTED 30 g 0   hydrOXYzine (ATARAX) 25 MG tablet Take 25 mg by mouth every 8 (eight) hours as needed.     losartan (COZAAR) 50 MG tablet Take 1 tablet (50 mg total) by mouth daily. 90 tablet 1   mupirocin ointment (BACTROBAN) 2 % APPLY 1 APPLICATION TOPICALLY 2 TIMES DAILY 22 g 0   niacin (NIASPAN) 1000 MG CR tablet Take 2 tablets (2,000 mg total) by mouth at bedtime. 180 tablet 2   pantoprazole (PROTONIX) 20 MG tablet Take 20 mg by mouth daily.      rosuvastatin (CRESTOR) 40 MG tablet Take 1 tablet (40 mg total) by mouth every evening. 90 tablet 1   tamsulosin (FLOMAX) 0.4 MG CAPS capsule Take 1 capsule (0.4 mg  total) by mouth daily. 90 capsule 1   testosterone cypionate (DEPOTESTOSTERONE CYPIONATE) 200 MG/ML injection INJECT 0.75 ML INTRAMUSCULARLY EVERY 10 DAYS AS DIRECTED 10 mL 0   VASCEPA 1 g capsule TAKE 2 CAPSULES BY MOUTH TWICE DAILY 360 capsule 0   vitamin B-12 (CYANOCOBALAMIN) 1000 MCG tablet Take 1,000 mcg by mouth daily.     No current facility-administered medications for this visit.    Allergies:   Clonidine derivatives and Clonidine    Social History:   reports that he quit smoking about 16 years ago. His smoking use included cigars. He has never used smokeless tobacco. He reports that he does not drink alcohol and does not use drugs.   Family History:  family history includes Anxiety disorder in his father; Cancer in his father; Depression in his daughter and father; Diabetes in his mother; Hypertension in his mother.    ROS:     Review of Systems  Constitutional: Negative.   HENT: Negative.    Eyes: Negative.   Respiratory: Negative.    Cardiovascular: Negative.   Gastrointestinal: Negative.   Genitourinary: Negative.   Musculoskeletal: Negative.   Skin: Negative.   Neurological: Negative.   Endo/Heme/Allergies: Negative.   Psychiatric/Behavioral: Negative.    All other systems reviewed and are negative.    All other systems are reviewed and negative.    PHYSICAL EXAM: VS:  BP 135/70   Pulse 84   Ht 6\' 1"  (1.854 m)   Wt 215 lb 3.2 oz (97.6 kg)   SpO2 93%   BMI 28.39 kg/m  , BMI Body mass index is 28.39 kg/m. Last weight:  Wt Readings from Last 3 Encounters:  04/08/23 215 lb 3.2 oz (97.6 kg)  03/05/23 215 lb (97.5 kg)  01/07/23 213 lb 3.2 oz (96.7 kg)     Physical Exam Vitals and nursing note reviewed.  Constitutional:      Appearance: Normal appearance. He is normal weight.  HENT:     Head: Normocephalic and atraumatic.  Eyes:     Extraocular Movements: Extraocular movements intact.     Conjunctiva/sclera: Conjunctivae normal.     Pupils: Pupils  are equal, round, and reactive to light.  Cardiovascular:     Rate and Rhythm: Normal rate and regular rhythm.  Pulmonary:     Effort: Pulmonary effort is normal.     Breath sounds: Normal breath sounds.  Abdominal:     General: Abdomen is flat. Bowel sounds are normal.     Palpations: Abdomen is soft.  Musculoskeletal:        General: Normal range of motion.     Cervical back:  Normal range of motion.  Neurological:     General: No focal deficit present.     Mental Status: He is alert and oriented to person, place, and time. Mental status is at baseline.  Psychiatric:        Mood and Affect: Mood normal.        Behavior: Behavior normal.        Thought Content: Thought content normal.        Judgment: Judgment normal.      EKG: none today  Recent Labs: 11/26/2022: ALT 17; BUN 14; Creatinine, Ser 1.27; Potassium 3.7; Sodium 140; TSH 1.120 03/05/2023: Hemoglobin 13.8; Platelets 206    Lipid Panel    Component Value Date/Time   CHOL 89 (L) 11/26/2022 1443   CHOL 115 08/06/2016 0954   TRIG 193 (H) 11/26/2022 1443   TRIG 188 (H) 08/06/2016 0954   HDL 36 (L) 11/26/2022 1443   CHOLHDL 2.5 08/20/2019 0836   CHOLHDL 3.3 03/28/2018 0854   VLDL 37 03/28/2018 0854   VLDL 38 (H) 08/06/2016 0954   LDLCALC 22 11/26/2022 1443      ASSESSMENT AND PLAN:    ICD-10-CM   1. Essential hypertension  I10 PCV ECHOCARDIOGRAM COMPLETE    2. Coronary artery disease involving native coronary artery of native heart without angina pectoris  I25.10     3. Mixed hyperlipidemia  E78.2        Problem List Items Addressed This Visit       Cardiovascular and Mediastinum   Essential hypertension - Primary    Well controlled. Continue same regimen. Last echo 08/2021, will repeat prior to  next visit.       Relevant Orders   PCV ECHOCARDIOGRAM COMPLETE   CAD (coronary artery disease)    Denies chest pain, shortness of breath. Riding bicycle long distances with no problems.         Other    Hyperlipidemia    Well controlled, continue same medications.          Disposition:   Return in about 4 months (around 08/09/2023) for with echo week prior.    Total time spent: 25 minutes  Signed,  Google, NP  04/08/2023 9:28 AM    [facility]

## 2023-04-08 NOTE — Assessment & Plan Note (Signed)
Well controlled, continue same medications.  

## 2023-04-08 NOTE — Assessment & Plan Note (Signed)
Denies chest pain, shortness of breath. Riding bicycle long distances with no problems.

## 2023-04-11 ENCOUNTER — Other Ambulatory Visit: Payer: Self-pay | Admitting: Family Medicine

## 2023-04-11 NOTE — Telephone Encounter (Signed)
Requested Prescriptions  Refused Prescriptions Disp Refills   meloxicam (MOBIC) 15 MG tablet [Pharmacy Med Name: MELOXICAM 15 MG TAB] 90 tablet 1    Sig: TAKE 1 TABLET BY MOUTH DAILY     Analgesics:  COX2 Inhibitors Failed - 04/11/2023 10:21 AM      Failed - Manual Review: Labs are only required if the patient has taken medication for more than 8 weeks.      Passed - HGB in normal range and within 360 days    Hemoglobin  Date Value Ref Range Status  03/05/2023 13.8 13.0 - 17.0 g/dL Final  16/06/9603 54.0 13.0 - 17.7 g/dL Final         Passed - Cr in normal range and within 360 days    Creatinine, Ser  Date Value Ref Range Status  11/26/2022 1.27 0.76 - 1.27 mg/dL Final         Passed - HCT in normal range and within 360 days    HCT  Date Value Ref Range Status  03/05/2023 41.4 39.0 - 52.0 % Final   Hematocrit  Date Value Ref Range Status  11/26/2022 48.3 37.5 - 51.0 % Final         Passed - AST in normal range and within 360 days    AST  Date Value Ref Range Status  11/26/2022 15 0 - 40 IU/L Final   AST (SGOT) Piccolo, Waived  Date Value Ref Range Status  08/06/2016 28 11 - 38 U/L Final         Passed - ALT in normal range and within 360 days    ALT  Date Value Ref Range Status  11/26/2022 17 0 - 44 IU/L Final   ALT (SGPT) Piccolo, Waived  Date Value Ref Range Status  08/06/2016 23 10 - 47 U/L Final         Passed - eGFR is 30 or above and within 360 days    GFR calc Af Amer  Date Value Ref Range Status  08/15/2020 75 >59 mL/min/1.73 Final    Comment:    **In accordance with recommendations from the NKF-ASN Task force,**   [facility] is in the process of updating its eGFR calculation to the   2021 CKD-EPI creatinine equation that estimates kidney function   without a race variable.    GFR, Estimated  Date Value Ref Range Status  04/22/2021 57 (L) >60 mL/min Final    Comment:    (NOTE) Calculated using the CKD-EPI Creatinine Equation (2021)    eGFR   Date Value Ref Range Status  11/26/2022 60 >59 mL/min/1.73 Final         Passed - Patient is not pregnant      Passed - Valid encounter within last 12 months    Recent Outpatient Visits           4 months ago Routine general medical examination at a health care facility   [facility], [name], DO   8 months ago Preop exam for internal medicine   [place] [facility] [place], [name], DO   10 months ago Essential hypertension   [facility] [place], [name], DO   11 months ago Pre-op evaluation   [place] [facility] [place], [place], DO   1 year ago Essential hypertension   [place] [facility] Dorcas Carrow, DO       Future Appointments  In 1 month Laural Benes, Oralia Rud, DO [facility]   In 2 months [name], Verna Czech, MD [facility]   In 3 months Laurier Nancy, MD [facility]   In 4 months Deirdre Evener, MD [facility]

## 2023-04-17 ENCOUNTER — Telehealth: Payer: Self-pay | Admitting: Family Medicine

## 2023-04-17 NOTE — Telephone Encounter (Signed)
Medication is not on patient current medication list please advise.

## 2023-04-17 NOTE — Telephone Encounter (Signed)
Pt called to report that he had back surgery last August, he was instructed to stop taking it on August 20th 2023 among other medicines. He had 2 metal rods put in his back in August and has not taken Meloxicam until April. Pt is now completely out of his current supply and would like a refill.   [facility] - [place], [place] - [address]  [address] [place] [address]  Phone: [phone] Fax: [phone]

## 2023-04-18 ENCOUNTER — Other Ambulatory Visit: Payer: Self-pay | Admitting: Cardiovascular Disease

## 2023-04-19 ENCOUNTER — Ambulatory Visit: Payer: Self-pay

## 2023-04-19 MED ORDER — MELOXICAM 15 MG PO TABS: 15.0000 mg | ORAL_TABLET | Freq: Every day | ORAL | 1 refills | Status: DC

## 2023-04-19 NOTE — Telephone Encounter (Signed)
Rx has been refilled- please remind patient on turn around time on Rxs.

## 2023-04-19 NOTE — Telephone Encounter (Signed)
Message from [place] sent at 04/19/2023  2:04 PM EDT  Summary: rx concern   The patient has called regarding their meloxicam (MOBIC) 15 MG tablet [[id number]] refill request  The patient is concerned with going without the medication and the effects of it  Please contact further when possible        Taking regularly for arthritis. Has been using left over Meloxicam takes evvery other day. Last until last week.    Chief Complaint: wants refills of Meloxicam  Symptoms: left hand pain from arthritis   Disposition: [] ED /[] Urgent Care (no appt availability in office) / [] Appointment(In office/virtual)/ []  [place] [facility]/ [] Home Care/ [] Refused Recommended Disposition /[]  Mobile Bus/ [x]  Follow-up with PCP Additional Notes: Pt had 3 surgeries and was advised to not take Meloxicam. Pt was advised by foot surgeon that he can resume Meloxicam for arthritis relief. Pt called earlier in the week and med was refused. Pt stated that he didn't understand why. Med is listed on past me list and expired 11/26/22. Pt is out and stated that he does get relief from pain with Meloxicam. He stated he usually has to take it every other day. Pt stated he was off of it for 8 months due to 3 surgeries. Pt stated since April he has been taking left over Meloxicam. Pt stated that he has been out of med for a week. Last CMP in March 2024.   Please call in to [facility] if approved.   Reason for Disposition  Prescription request for new medicine (not a refill)  Answer Assessment - Initial Assessment Questions 1. DRUG NAME: "What medicine do you need to have refilled?"     Meloxicam  2. REFILLS REMAINING: "How many refills are remaining?" (Note: The label on the medicine or pill bottle will show how many refills are remaining. If there are no refills remaining, then a renewal may be needed.)     no 3. EXPIRATION DATE: "What is the expiration date?" (Note: The label states when the  prescription will expire, and thus can no longer be refilled.)     3/11//24 4. PRESCRIBING HCP: "Who prescribed it?" Reason: If prescribed by specialist, call should be referred to that group.     Dr. Laural Benes 5. SYMPTOMS: "Do you have any symptoms?"     Left hand arthritis pain  Protocols used: Medication Refill and Renewal Call-A-AH

## 2023-04-22 NOTE — Telephone Encounter (Signed)
Patient notified

## 2023-04-23 ENCOUNTER — Ambulatory Visit: Payer: Medicare PPO | Admitting: Psychiatry

## 2023-04-27 ENCOUNTER — Other Ambulatory Visit: Payer: Self-pay | Admitting: Cardiovascular Disease

## 2023-05-01 ENCOUNTER — Other Ambulatory Visit: Payer: Self-pay | Admitting: Cardiovascular Disease

## 2023-05-02 ENCOUNTER — Telehealth: Payer: Self-pay

## 2023-05-02 DIAGNOSIS — Z981 Arthrodesis status: Secondary | ICD-10-CM | POA: Diagnosis not present

## 2023-05-02 NOTE — Telephone Encounter (Signed)
Patient called about his cpap, I have put in a call to [facility] to see what is going on

## 2023-05-03 ENCOUNTER — Telehealth: Payer: Self-pay | Admitting: Cardiovascular Disease

## 2023-05-03 DIAGNOSIS — G4733 Obstructive sleep apnea (adult) (pediatric): Secondary | ICD-10-CM | POA: Diagnosis not present

## 2023-05-03 DIAGNOSIS — I10 Essential (primary) hypertension: Secondary | ICD-10-CM | POA: Diagnosis not present

## 2023-05-03 NOTE — Telephone Encounter (Signed)
Patient left VM that we were supposed to send in orders for him a new CPAP back in May and it has not been done. Would like to use [facility].

## 2023-05-07 ENCOUNTER — Ambulatory Visit (INDEPENDENT_AMBULATORY_CARE_PROVIDER_SITE_OTHER): Payer: Medicare PPO | Admitting: Psychiatry

## 2023-05-07 ENCOUNTER — Encounter: Payer: Self-pay | Admitting: Psychiatry

## 2023-05-07 VITALS — BP 138/73 | HR 73 | Temp 98.1°F | Ht 73.0 in | Wt 212.8 lb

## 2023-05-07 DIAGNOSIS — F411 Generalized anxiety disorder: Secondary | ICD-10-CM | POA: Diagnosis not present

## 2023-05-07 DIAGNOSIS — F330 Major depressive disorder, recurrent, mild: Secondary | ICD-10-CM | POA: Diagnosis not present

## 2023-05-07 DIAGNOSIS — G4701 Insomnia due to medical condition: Secondary | ICD-10-CM | POA: Diagnosis not present

## 2023-05-07 MED ORDER — HYDROXYZINE PAMOATE 25 MG PO CAPS
25.0000 mg | ORAL_CAPSULE | Freq: Two times a day (BID) | ORAL | 1 refills | Status: AC | PRN
Start: 2023-05-07 — End: ?

## 2023-05-07 NOTE — Patient Instructions (Signed)
  [email or web address]  [email or web address]  [email or web address] [facility] [address], [place], [place] [address]   [phone]  [facility] [email or web address] [address] [place], [place] [address]  [phone]  [facility], [facility] [email or web address] [address], [place], [place] [address]  [phone]   [facility] - [phone]  [facility] - [phone]  [facility] - [phone] counseling [phone]  [facility] [phone]   [email or web address] this clinician can offer telehealth and has a sliding scale option  [email or web address] this group also offers sliding scale rates and is based out of [place]  Dr. Liborio Nixon with the [facility] specializes in divorce  [employer] [facility] has interns who offer sliding scale rates and some of the full time clinicians do, as well. You complete their contact form on their website and the referrals coordinator will help to get connected to someone   Medicaid below :  [facility] [address] [place], [place] [address]  [phone]    [employer] [address] [employer] [place], [place] [address]  [phone]    [facility] [address] [place], [place] [address]  [phone]

## 2023-05-07 NOTE — Progress Notes (Signed)
BH MD OP Progress Note  05/07/2023 12:50 PM Kenneth Pruitt  MRN:  [id number]  Chief Complaint:  Chief Complaint  Patient presents with   Follow-up   Anxiety   Depression   Medication Refill   HPI: Kenneth Pruitt is a 72 year old Caucasian male, retired, married, lives in [place], has a history of MDD, GAD, insomnia, coronary artery disease status post stent placement, knee pain, multiple surgeries, testosterone deficiency was evaluated in office today.  Patient today reports he does have situational stressors at this time.  He continues to struggle with his own physical limitations, due to aging as well as the fact that he has had surgeries to his neck, lower extremities, back.  Patient also reports his wife is going through medical problems.  Patient currently does not have a therapist however motivated to establish care.  Patient reports he often feels anxious, frustrated due to his situational stressors.  Patient also reports thinking about his life in general since he is aging as to 'what comes next ?'  Reports he often feels like taking a trip somewhere, his wife is not interested, he has invited her several times.  He is planning to take it by himself and is hopeful that might make him feel better.  He continues to ride his bike was able to invest in an e bike recently.  Patient reports he still continues to have a group of buddies that he ride with and he rides 2 to 3 hours daily.  That does help him both emotionally and physically.  Patient reports sleep is overall good.  He does take a couple of hours of nap during the day if he feels tired.  He continues to be compliant on the CPAP.  Currently compliant on Wellbutrin, Cymbalta.  Denies side effects.  Reports he is interested in getting back on the hydroxyzine as needed.  Denies suicidality, homicidality or perceptual disturbances.  Patient denies any other concerns today.  Visit Diagnosis:    ICD-10-CM   1. MDD (major  depressive disorder), recurrent episode, mild (HCC)  F33.0     2. GAD (generalized anxiety disorder)  F41.1 hydrOXYzine (VISTARIL) 25 MG capsule    3. Insomnia due to medical condition  G47.01    Obstructive sleep apnea on CPAP, pain      Past Psychiatric History: I have reviewed past psychiatric history from progress note on 12/16/2017.  Past Medical History:  Past Medical History:  Diagnosis Date   Arthritis    Coronary artery disease    Depression    Hyperlipidemia    Skin cancer    left forearm treated years ago by Dr. Vonita Moss    Past Surgical History:  Procedure Laterality Date   ANKLE FRACTURE SURGERY Right    ARTHRODESIS ANT INTERBODY INC DISCECTOMY, CERVICAL BELOW C2    05/2019   CARDIAC CATHETERIZATION N/A 04/26/2015   Procedure: Left Heart Cath;  Surgeon: Laurier Nancy, MD;  Location: [facility] INVASIVE CV LAB;  Service: Cardiovascular;  Laterality: N/A;   CLAVICLE SURGERY Right 05/04/2021   CORONARY ANGIOPLASTY     CORONARY STENT INTERVENTION N/A 03/27/2018   Procedure: CORONARY STENT INTERVENTION;  Surgeon: Alwyn Pea, MD;  Location: [facility] INVASIVE CV LAB;  Service: Cardiovascular;  Laterality: N/A;   EYE SURGERY Bilateral    cataract   FOOT SURGERY Left 08/22/2022   FOOT SURGERY Right 10/31/2022   LEFT HEART CATH AND CORONARY ANGIOGRAPHY Left 03/27/2018   Procedure: Left Heart Cath with possible  coronary intervention;  Surgeon: Laurier Nancy, MD;  Location: [facility] INVASIVE CV LAB;  Service: Cardiovascular;  Laterality: Left;   LUMBAR FUSION  05/08/2022   TOTAL KNEE ARTHROPLASTY Right 10/08/2018   Procedure: TOTAL KNEE ARTHROPLASTY;  Surgeon: Lyndle Herrlich, MD;  Location: [facility] ORS;  Service: Orthopedics;  Laterality: Right;    Family Psychiatric History: I have reviewed family psychiatric history from progress note on 12/16/2017.  Family History:  Family History  Problem Relation Age of Onset   Diabetes Mother    Hypertension Mother    Cancer Father     Depression Father    Anxiety disorder Father    Depression Daughter     Social History: I have reviewed social history from progress note on 12/16/2017. Social History   Socioeconomic History   Marital status: Married    Spouse name: ann   Number of children: 2   Years of education: Not on file   Highest education level: Master's degree (e.g., MA, MS, MEng, MEd, MSW, MBA)  Occupational History    Comment: retired  Tobacco Use   Smoking status: Former    Types: Cigars    Quit date: 07/18/2006    Years since quitting: 16.8   Smokeless tobacco: Never  Vaping Use   Vaping status: Never Used  Substance and Sexual Activity   Alcohol use: No   Drug use: No   Sexual activity: Not Currently  Other Topics Concern   Not on file  Social History Narrative   Not on file   Social Determinants of Health   Financial Resource Strain: Low Risk  (06/12/2022)   Overall Financial Resource Strain (CARDIA)    Difficulty of Paying Living Expenses: Not hard at all  Food Insecurity: No Food Insecurity (06/12/2022)   Hunger Vital Sign    Worried About Running Out of Food in the Last Year: Never true    Ran Out of Food in the Last Year: Never true  Transportation Needs: No Transportation Needs (06/12/2022)   PRAPARE - Administrator, Civil Service (Medical): No    Lack of Transportation (Non-Medical): No  Physical Activity: Insufficiently Active (06/12/2022)   Exercise Vital Sign    Days of Exercise per Week: 3 days    Minutes of Exercise per Session: 30 min  Stress: No Stress Concern Present (06/12/2022)   [employer] of Occupational Health - Occupational Stress Questionnaire    Feeling of Stress : Not at all  Social Connections: Moderately Integrated (06/12/2022)   Social Connection and Isolation Panel [NHANES]    Frequency of Communication with Friends and Family: Twice a week    Frequency of Social Gatherings with Friends and Family: More than three times a week    Attends  Religious Services: 1 to 4 times per year    Active Member of [employer] or Organizations: No    Attends Banker Meetings: Never    Marital Status: Married    Allergies:  Allergies  Allergen Reactions   Clonidine Derivatives Shortness Of Breath and Other (See Comments)    Low heart rate   Clonidine     Mild bradycardia + Somnolence    Metabolic Disorder Labs: Lab Results  Component Value Date   HGBA1C 6.1 (H) 05/01/2022   No results found for: "PROLACTIN" Lab Results  Component Value Date   CHOL 89 (L) 11/26/2022   TRIG 193 (H) 11/26/2022   HDL 36 (L) 11/26/2022   CHOLHDL 2.5 08/20/2019   VLDL  37 03/28/2018   LDLCALC 22 11/26/2022   LDLCALC 29 03/01/2022   Lab Results  Component Value Date   TSH 1.120 11/26/2022   TSH 0.939 08/31/2021    Therapeutic Level Labs: No results found for: "LITHIUM" No results found for: "VALPROATE" No results found for: "CBMZ"  Current Medications: Current Outpatient Medications  Medication Sig Dispense Refill   acetaminophen (TYLENOL) 500 MG tablet Take by mouth.     amLODipine (NORVASC) 5 MG tablet Take by mouth.     aspirin 81 MG EC tablet Take 1 tablet (81 mg total) by mouth daily. 90 tablet 3   buPROPion (WELLBUTRIN XL) 150 MG 24 hr tablet TAKE 1 TABLET BY MOUTH ONCE DAILY WITH BREAKFAST 90 tablet 1   chlorthalidone (HYGROTON) 25 MG tablet TAKE 1 TABLET BY MOUTH DAILY 90 tablet 0   Cholecalciferol 125 MCG (5000 UT) capsule Take 5,000 Units by mouth daily.     clopidogrel (PLAVIX) 75 MG tablet TAKE 1 TABLET BY MOUTH DAILY 90 tablet 0   clotrimazole-betamethasone (LOTRISONE) cream Apply 1 application topically 2 (two) times daily. 30 g 0   diclofenac Sodium (VOLTAREN) 1 % GEL      DULoxetine (CYMBALTA) 20 MG capsule Take 20 mg daily along with 60 mg - total of 80 mg . 90 capsule 1   DULoxetine (CYMBALTA) 60 MG capsule TAKE 1 CAPSULE BY MOUTH DAILY ALONG WITHTHE 20MG  FOR A TOTAL OF 80MG  90 capsule 1   dutasteride (AVODART)  0.5 MG capsule TAKE 1 CAPSULE BY MOUTH EVERY DAY IN THEEVENING 90 capsule 1   fluticasone (FLONASE) 50 MCG/ACT nasal spray Place 2 sprays into both nostrils daily. 16 g 12   hydrALAZINE (APRESOLINE) 50 MG tablet Take 1 tablet (50 mg total) by mouth 2 (two) times daily. 120 tablet 2   hydrocortisone (ANUSOL-HC) 2.5 % rectal cream APPLY RECTALLY TWICE DAILY AS DIRECTED 30 g 0   hydrOXYzine (VISTARIL) 25 MG capsule Take 1 capsule (25 mg total) by mouth 2 (two) times daily as needed for anxiety. 60 capsule 1   losartan (COZAAR) 50 MG tablet Take 1 tablet (50 mg total) by mouth daily. 90 tablet 1   meloxicam (MOBIC) 15 MG tablet Take 1 tablet (15 mg total) by mouth daily. 90 tablet 1   mupirocin ointment (BACTROBAN) 2 % APPLY 1 APPLICATION TOPICALLY 2 TIMES DAILY 22 g 0   niacin (NIASPAN) 1000 MG CR tablet Take 2 tablets (2,000 mg total) by mouth at bedtime. 180 tablet 2   pantoprazole (PROTONIX) 20 MG tablet TAKE ONE TABLET BY MOUTH EVERY DAY 90 tablet 0   rosuvastatin (CRESTOR) 40 MG tablet Take 1 tablet (40 mg total) by mouth every evening. 90 tablet 1   tamsulosin (FLOMAX) 0.4 MG CAPS capsule Take 1 capsule (0.4 mg total) by mouth daily. 90 capsule 1   testosterone cypionate (DEPOTESTOSTERONE CYPIONATE) 200 MG/ML injection INJECT 0.75 ML INTRAMUSCULARLY EVERY 10 DAYS AS DIRECTED 10 mL 0   VASCEPA 1 g capsule TAKE 2 CAPSULES BY MOUTH TWICE DAILY 360 capsule 0   vitamin B-12 (CYANOCOBALAMIN) 1000 MCG tablet Take 1,000 mcg by mouth daily.     No current facility-administered medications for this visit.     Musculoskeletal: Strength & Muscle Tone: within normal limits Gait & Station:  slow- baseline Patient leans: N/A  Psychiatric Specialty Exam: Review of Systems  Psychiatric/Behavioral:  Positive for dysphoric mood. The patient is nervous/anxious.     Blood pressure 138/73, pulse 73, temperature 98.1 F (36.7  C), temperature source Skin, height 6\' 1"  (1.854 m), weight 212 lb 12.8 oz (96.5  kg).Body mass index is 28.08 kg/m.  General Appearance: Fairly Groomed  Eye Contact:  Fair  Speech:  Clear and Coherent  Volume:  Normal  Mood:  Anxious and sad due to situations  Affect:  Appropriate  Thought Process:  Goal Directed and Descriptions of Associations: Intact  Orientation:  Full (Time, Place, and Person)  Thought Content: Logical   Suicidal Thoughts:  No  Homicidal Thoughts:  No  Memory:  Immediate;   Fair Recent;   Fair Remote;   Fair  Judgement:  Fair  Insight:  Fair  Psychomotor Activity:  Normal  Concentration:  Concentration: Fair and Attention Span: Fair  Recall:  [employer] of Knowledge: Fair  Language: Fair  Akathisia:  No  Handed:  Right  AIMS (if indicated): not done  Assets:  Communication Skills Desire for Improvement Housing Social Support  ADL's:  Intact  Cognition: WNL  Sleep:  Fair   Screenings: Midwife Visit from 04/09/2022 in [facility] Office Visit from 02/06/2022 in [facility]  AIMS Total Score 0 0      GAD-7    Flowsheet Row Office Visit from 05/07/2023 in [facility] Office Visit from 12/17/2022 in [facility] Office Visit from 07/16/2022 in [facility] Office Visit from 06/11/2022 in [facility] Office Visit from 05/01/2022 in [facility]  Total GAD-7 Score 10 5 3 7 7       PHQ2-9    Flowsheet Row Office Visit from 05/07/2023 in [facility] Office Visit from 12/17/2022 in [facility] Office Visit from 07/16/2022 in [facility] Clinical Support from 06/12/2022 in [facility] Office Visit from 06/11/2022 in [facility]  PHQ-2 Total Score 2 2 2  0 2  PHQ-9 Total Score 12 12 5  0 8      Flowsheet Row Office Visit from 05/07/2023 in [facility] Office Visit from 12/17/2022 in [facility] Office Visit from 07/16/2022 in [facility]  C-SSRS RISK CATEGORY No Risk No Risk No Risk        Assessment and Plan: ESSAM PARKMAN is a 72 year old CM, who has a history of MDD, GAD, multiple medical problems including HLD,CAD, chronic pain, multiple other medical problems, was evaluated in office today.  Patient currently with multiple situational stressors will benefit from psychotherapy sessions, continue medications as noted below.  Plan MDD mild-unstable Patient advised to establish care with a therapist, start CBT, provided resources in the community. Continue Cymbalta 80 mg p.o. daily for now. Wellbutrin XL 150 mg p.o. daily, we will consider increasing the dosage as needed in the future.  GAD-unstable Start hydroxyzine 25 mg p.o. twice daily as needed for severe anxiety symptoms Cymbalta 80 mg p.o. daily Patient establish care with therapist, start CBT, provided resources.  Insomnia-stable Continue CPAP for OSA Continue sleep hygiene techniques.     Collaboration of Care: Collaboration of Care: Referral or follow-up with counselor/therapist AEB patient to establish care with therapist.  Patient/Guardian was advised Release of Information must be obtained prior to any record release in order to collaborate  their care with an outside provider. Patient/Guardian was advised if they have not already done so to contact the registration department to sign all necessary forms in order for [place] to release information regarding their care.   Consent: Patient/Guardian gives verbal consent for treatment and assignment of benefits for services provided during this visit.  Patient/Guardian expressed understanding and agreed to proceed.   Follow-up in clinic in 2 months or sooner if needed.  This note was generated in part or whole with voice recognition software. Voice recognition is usually quite accurate but there are transcription errors that can and very often do occur. I apologize for any typographical errors that were not detected and corrected.    Jomarie Longs, MD 05/07/2023, 12:50 PM

## 2023-05-15 ENCOUNTER — Other Ambulatory Visit: Payer: Self-pay | Admitting: Cardiovascular Disease

## 2023-05-29 ENCOUNTER — Ambulatory Visit: Payer: Medicare PPO | Admitting: Family Medicine

## 2023-05-29 ENCOUNTER — Encounter: Payer: Self-pay | Admitting: Family Medicine

## 2023-05-29 VITALS — BP 131/58 | HR 70 | Ht 73.0 in | Wt 211.8 lb

## 2023-05-29 DIAGNOSIS — N183 Chronic kidney disease, stage 3 unspecified: Secondary | ICD-10-CM | POA: Diagnosis not present

## 2023-05-29 DIAGNOSIS — E782 Mixed hyperlipidemia: Secondary | ICD-10-CM | POA: Diagnosis not present

## 2023-05-29 DIAGNOSIS — D751 Secondary polycythemia: Secondary | ICD-10-CM | POA: Diagnosis not present

## 2023-05-29 DIAGNOSIS — R350 Frequency of micturition: Secondary | ICD-10-CM | POA: Diagnosis not present

## 2023-05-29 DIAGNOSIS — I10 Essential (primary) hypertension: Secondary | ICD-10-CM

## 2023-05-29 DIAGNOSIS — R972 Elevated prostate specific antigen [PSA]: Secondary | ICD-10-CM | POA: Diagnosis not present

## 2023-05-29 DIAGNOSIS — N401 Enlarged prostate with lower urinary tract symptoms: Secondary | ICD-10-CM

## 2023-05-29 MED ORDER — ROSUVASTATIN CALCIUM 40 MG PO TABS
40.0000 mg | ORAL_TABLET | Freq: Every evening | ORAL | 1 refills | Status: DC
Start: 2023-05-29 — End: 2023-11-28

## 2023-05-29 MED ORDER — LOSARTAN POTASSIUM 50 MG PO TABS
50.0000 mg | ORAL_TABLET | Freq: Every day | ORAL | 1 refills | Status: DC
Start: 2023-05-29 — End: 2023-11-25

## 2023-05-29 MED ORDER — NIACIN ER (ANTIHYPERLIPIDEMIC) 1000 MG PO TBCR
2000.0000 mg | EXTENDED_RELEASE_TABLET | Freq: Every day | ORAL | 2 refills | Status: DC
Start: 2023-05-29 — End: 2023-11-28

## 2023-05-29 MED ORDER — MELOXICAM 15 MG PO TABS: 15.0000 mg | ORAL_TABLET | Freq: Every day | ORAL | 1 refills | Status: DC

## 2023-05-29 MED ORDER — TAMSULOSIN HCL 0.4 MG PO CAPS
0.4000 mg | ORAL_CAPSULE | Freq: Every day | ORAL | 1 refills | Status: DC
Start: 2023-05-29 — End: 2023-11-28

## 2023-05-29 MED ORDER — DUTASTERIDE 0.5 MG PO CAPS
ORAL_CAPSULE | ORAL | 1 refills | Status: DC
Start: 2023-05-29 — End: 2023-11-28

## 2023-05-29 NOTE — Assessment & Plan Note (Signed)
Under good control on current regimen. Continue current regimen. Continue to monitor. Call with any concerns. Refills given. Labs drawn today.   

## 2023-05-29 NOTE — Assessment & Plan Note (Signed)
Rechecking labs today. Await results. Treat as needed.  °

## 2023-05-29 NOTE — Assessment & Plan Note (Signed)
Running a little low in the summer, back up when it cools off. Continue hydration and continue current regimen. Continue to monitor. Call with any concerns. Refills given. Labs drawn today.

## 2023-05-29 NOTE — Progress Notes (Signed)
BP (!) 131/58   Pulse 70   Ht 6\' 1"  (1.854 m)   Wt 211 lb 12.8 oz (96.1 kg)   SpO2 97%   BMI 27.94 kg/m    Subjective:    Patient ID: Kenneth Pruitt, male    DOB: 04-12-1951, 72 y.o.   MRN: [id number]  HPI: Kenneth Pruitt is a 72 y.o. male  Chief Complaint  Patient presents with   Hypertension   Elevated PSA   Flu Vaccine    Patient was offered Flu vaccine at today's visit and says he and his wife are scheduled at pharmacy for Flu and COVID vaccine.   HYPERTENSION / HYPERLIPIDEMIA Satisfied with current treatment? yes Duration of hypertension: chronic BP monitoring frequency: a few times a week BP medication side effects: no Past BP meds: losartan, hydralazine, amlodipine, chlorthalidone Duration of hyperlipidemia: chronic Cholesterol medication side effects: no Cholesterol supplements: none Past cholesterol medications: vascepa, crestor Medication compliance: excellent compliance Aspirin: yes Recent stressors: no Recurrent headaches: no Visual changes: no Palpitations: no Dyspnea: no Chest pain: no Lower extremity edema: no Dizzy/lightheaded: yes  BPH BPH status: controlled Satisfied with current treatment?: yes Medication side effects: no Medication compliance: excellent compliance Duration: chronic Nocturia: 1-2x per night Urinary frequency:no Incomplete voiding: no Urgency: no Weak urinary stream: no Straining to start stream: no Dysuria: no Onset: gradual Severity: mild  Relevant past medical, surgical, family and social history reviewed and updated as indicated. Interim medical history since our last visit reviewed. Allergies and medications reviewed and updated.  Review of Systems  Constitutional: Negative.   Respiratory: Negative.    Cardiovascular: Negative.   Gastrointestinal: Negative.   Musculoskeletal: Negative.   Neurological:  Positive for light-headedness. Negative for dizziness, tremors, seizures, syncope, facial asymmetry,  speech difficulty, weakness, numbness and headaches.  Psychiatric/Behavioral: Negative.      Per HPI unless specifically indicated above     Objective:    BP (!) 131/58   Pulse 70   Ht 6\' 1"  (1.854 m)   Wt 211 lb 12.8 oz (96.1 kg)   SpO2 97%   BMI 27.94 kg/m   Wt Readings from Last 3 Encounters:  05/29/23 211 lb 12.8 oz (96.1 kg)  04/08/23 215 lb 3.2 oz (97.6 kg)  03/05/23 215 lb (97.5 kg)    Physical Exam Vitals and nursing note reviewed.  Constitutional:      General: He is not in acute distress.    Appearance: Normal appearance. He is not ill-appearing, toxic-appearing or diaphoretic.  HENT:     Head: Normocephalic and atraumatic.     Right Ear: External ear normal.     Left Ear: External ear normal.     Nose: Nose normal.     Mouth/Throat:     Mouth: Mucous membranes are moist.     Pharynx: Oropharynx is clear.  Eyes:     General: No scleral icterus.       Right eye: No discharge.        Left eye: No discharge.     Extraocular Movements: Extraocular movements intact.     Conjunctiva/sclera: Conjunctivae normal.     Pupils: Pupils are equal, round, and reactive to light.  Cardiovascular:     Rate and Rhythm: Normal rate and regular rhythm.     Pulses: Normal pulses.     Heart sounds: Normal heart sounds. No murmur heard.    No friction rub. No gallop.  Pulmonary:     Effort: Pulmonary effort is normal.  No respiratory distress.     Breath sounds: Normal breath sounds. No stridor. No wheezing, rhonchi or rales.  Chest:     Chest wall: No tenderness.  Musculoskeletal:        General: Normal range of motion.     Cervical back: Normal range of motion and neck supple.  Skin:    General: Skin is warm and dry.     Capillary Refill: Capillary refill takes less than 2 seconds.     Coloration: Skin is not jaundiced or pale.     Findings: No bruising, erythema, lesion or rash.  Neurological:     General: No focal deficit present.     Mental Status: He is alert and  oriented to person, place, and time. Mental status is at baseline.  Psychiatric:        Mood and Affect: Mood normal.        Behavior: Behavior normal.        Thought Content: Thought content normal.        Judgment: Judgment normal.     Results for orders placed or performed in visit on 03/05/23  CBC with Differential/Platelet  Result Value Ref Range   WBC 3.8 (L) 4.0 - 10.5 K/uL   RBC 4.77 4.22 - 5.81 MIL/uL   Hemoglobin 13.8 13.0 - 17.0 g/dL   HCT 16.1 09.6 - 04.5 %   MCV 86.8 80.0 - 100.0 fL   MCH 28.9 26.0 - 34.0 pg   MCHC 33.3 30.0 - 36.0 g/dL   RDW 40.9 (H) 81.1 - 91.4 %   Platelets 206 150 - 400 K/uL   nRBC 0.0 0.0 - 0.2 %   Neutrophils Relative % 53 %   Neutro Abs 2.0 1.7 - 7.7 K/uL   Lymphocytes Relative 29 %   Lymphs Abs 1.1 0.7 - 4.0 K/uL   Monocytes Relative 15 %   Monocytes Absolute 0.6 0.1 - 1.0 K/uL   Eosinophils Relative 2 %   Eosinophils Absolute 0.1 0.0 - 0.5 K/uL   Basophils Relative 1 %   Basophils Absolute 0.0 0.0 - 0.1 K/uL   Immature Granulocytes 0 %   Abs Immature Granulocytes 0.00 0.00 - 0.07 K/uL      Assessment & Plan:   Problem List Items Addressed This Visit       Cardiovascular and Mediastinum   Essential hypertension    Running a little low in the summer, back up when it cools off. Continue hydration and continue current regimen. Continue to monitor. Call with any concerns. Refills given. Labs drawn today.       Relevant Medications   losartan (COZAAR) 50 MG tablet   niacin (NIASPAN) 1000 MG CR tablet   rosuvastatin (CRESTOR) 40 MG tablet   Other Relevant Orders   Basic metabolic panel     Genitourinary   BPH (benign prostatic hyperplasia) - Primary    Under good control on current regimen. Continue current regimen. Continue to monitor. Call with any concerns. Refills given. Labs drawn today.        Relevant Medications   dutasteride (AVODART) 0.5 MG capsule   tamsulosin (FLOMAX) 0.4 MG CAPS capsule   Other Relevant Orders    PSA   Chronic kidney disease (CKD), stage III (moderate) (HCC)    Rechecking labs today. Await results. Treat as needed.       Relevant Orders   CBC with Differential/Platelet     Other   Hyperlipidemia    Under good control on  current regimen. Continue current regimen. Continue to monitor. Call with any concerns. Refills given. Labs drawn today.        Relevant Medications   losartan (COZAAR) 50 MG tablet   niacin (NIASPAN) 1000 MG CR tablet   rosuvastatin (CRESTOR) 40 MG tablet   Other Relevant Orders   Lipid Panel w/o Chol/HDL Ratio   Elevated PSA    Rechecking labs today. Await results. Treat as needed.       Relevant Medications   tamsulosin (FLOMAX) 0.4 MG CAPS capsule   Other Relevant Orders   PSA   Polycythemia, secondary    Rechecking labs today. Await results. Treat as needed.       Relevant Orders   CBC with Differential/Platelet     Follow up plan: Return in about 6 months (around 11/26/2023) for physical.

## 2023-05-30 LAB — CBC WITH DIFFERENTIAL/PLATELET
Basophils Absolute: 0 10*3/uL (ref 0.0–0.2)
Basos: 1 %
EOS (ABSOLUTE): 0.1 10*3/uL (ref 0.0–0.4)
Eos: 3 %
Hematocrit: 47 % (ref 37.5–51.0)
Hemoglobin: 15.4 g/dL (ref 13.0–17.7)
Immature Grans (Abs): 0 10*3/uL (ref 0.0–0.1)
Immature Granulocytes: 0 %
Lymphocytes Absolute: 1.1 10*3/uL (ref 0.7–3.1)
Lymphs: 26 %
MCH: 29.4 pg (ref 26.6–33.0)
MCHC: 32.8 g/dL (ref 31.5–35.7)
MCV: 90 fL (ref 79–97)
Monocytes Absolute: 0.4 10*3/uL (ref 0.1–0.9)
Monocytes: 10 %
Neutrophils Absolute: 2.6 10*3/uL (ref 1.4–7.0)
Neutrophils: 60 %
Platelets: 209 10*3/uL (ref 150–450)
RBC: 5.24 x10E6/uL (ref 4.14–5.80)
RDW: 14.5 % (ref 11.6–15.4)
WBC: 4.2 10*3/uL (ref 3.4–10.8)

## 2023-05-30 LAB — LIPID PANEL W/O CHOL/HDL RATIO
Cholesterol, Total: 86 mg/dL — ABNORMAL LOW (ref 100–199)
HDL: 39 mg/dL — ABNORMAL LOW (ref >39)
LDL Chol Calc (NIH): 27 mg/dL (ref 0–99)
Triglycerides: 106 mg/dL (ref 0–149)
VLDL Cholesterol Cal: 20 mg/dL (ref 5–40)

## 2023-05-30 LAB — BASIC METABOLIC PANEL
BUN/Creatinine Ratio: 16 (ref 10–24)
BUN: 20 mg/dL (ref 8–27)
CO2: 27 mmol/L (ref 20–29)
Calcium: 9 mg/dL (ref 8.6–10.2)
Chloride: 101 mmol/L (ref 96–106)
Creatinine, Ser: 1.27 mg/dL (ref 0.76–1.27)
Glucose: 128 mg/dL — ABNORMAL HIGH (ref 70–99)
Potassium: 3.5 mmol/L (ref 3.5–5.2)
Sodium: 142 mmol/L (ref 134–144)
eGFR: 60 mL/min/{1.73_m2} (ref >59)

## 2023-05-30 LAB — PSA: Prostate Specific Ag, Serum: 2.7 ng/mL (ref 0.0–4.0)

## 2023-06-03 ENCOUNTER — Other Ambulatory Visit: Payer: Self-pay | Admitting: *Deleted

## 2023-06-03 DIAGNOSIS — E291 Testicular hypofunction: Secondary | ICD-10-CM

## 2023-06-03 DIAGNOSIS — N401 Enlarged prostate with lower urinary tract symptoms: Secondary | ICD-10-CM

## 2023-06-05 ENCOUNTER — Other Ambulatory Visit: Payer: Self-pay | Admitting: Cardiovascular Disease

## 2023-06-05 ENCOUNTER — Other Ambulatory Visit: Payer: Self-pay | Admitting: Psychiatry

## 2023-06-05 DIAGNOSIS — F411 Generalized anxiety disorder: Secondary | ICD-10-CM

## 2023-06-05 DIAGNOSIS — E782 Mixed hyperlipidemia: Secondary | ICD-10-CM

## 2023-06-10 ENCOUNTER — Other Ambulatory Visit: Payer: Medicare PPO

## 2023-06-10 DIAGNOSIS — N401 Enlarged prostate with lower urinary tract symptoms: Secondary | ICD-10-CM | POA: Diagnosis not present

## 2023-06-11 LAB — PSA: Prostate Specific Ag, Serum: 2.6 ng/mL (ref 0.0–4.0)

## 2023-06-11 LAB — HEMATOCRIT: Hematocrit: 46.6 % (ref 37.5–51.0)

## 2023-06-11 LAB — TESTOSTERONE: Testosterone: 658 ng/dL (ref 264–916)

## 2023-06-12 ENCOUNTER — Ambulatory Visit: Payer: Medicare PPO | Admitting: Urology

## 2023-06-12 ENCOUNTER — Encounter: Payer: Self-pay | Admitting: Urology

## 2023-06-12 VITALS — BP 132/72 | HR 66 | Ht 73.0 in | Wt 210.0 lb

## 2023-06-12 DIAGNOSIS — E291 Testicular hypofunction: Secondary | ICD-10-CM | POA: Diagnosis not present

## 2023-06-12 DIAGNOSIS — N401 Enlarged prostate with lower urinary tract symptoms: Secondary | ICD-10-CM | POA: Diagnosis not present

## 2023-06-12 MED ORDER — TESTOSTERONE CYPIONATE 200 MG/ML IM SOLN: INTRAMUSCULAR | 0 refills | Status: DC

## 2023-06-12 NOTE — Progress Notes (Signed)
I, Duke Salvia, acting as a Neurosurgeon for Riki Altes, MD.,have documented all relevant documentation on the behalf of Riki Altes, MD, as directed by  Riki Altes, MD while in the presence of Riki Altes, MD.  06/12/2023 9:40 AM   Kenneth Pruitt 1951/03/15 [id number]  Referring provider: Dorcas Carrow, DO [address] [place],  [place] [address]  Chief Complaint  Patient presents with   Hypogonadism    Urologic History:  1. Hypogonadism -Symptoms: tiredness, fatigue, depressed mood. -Testosterone and CPN8 injections. Followed by hematology for erythrocytosis.  2. BPH with LUTS Tamsulosin/Finasteride prescribed by PCP.   HPI: 72 y.o.male presents for annual follow-up.  Denies dysuria, gross hematuria No flank, abdominal or pelvic pain Stable since last year's visit. Remains on Tamsulosin/Dutasteride. Labs 06/10/23: Testosterone 658 ng/dL, HCT 74.2, PSA stable 2.6.   PMH: Past Medical History:  Diagnosis Date   Arthritis    Coronary artery disease    Depression    Hyperlipidemia    Skin cancer    left forearm treated years ago by Dr. Vonita Moss    Surgical History: Past Surgical History:  Procedure Laterality Date   ANKLE FRACTURE SURGERY Right    ARTHRODESIS ANT INTERBODY INC DISCECTOMY, CERVICAL BELOW C2    05/2019   CARDIAC CATHETERIZATION N/A 04/26/2015   Procedure: Left Heart Cath;  Surgeon: Laurier Nancy, MD;  Location: [facility] INVASIVE CV LAB;  Service: Cardiovascular;  Laterality: N/A;   CLAVICLE SURGERY Right 05/04/2021   CORONARY ANGIOPLASTY     CORONARY STENT INTERVENTION N/A 03/27/2018   Procedure: CORONARY STENT INTERVENTION;  Surgeon: Alwyn Pea, MD;  Location: [facility];  Service: Cardiovascular;  Laterality: N/A;   EYE SURGERY Bilateral    cataract   FOOT SURGERY Left 08/22/2022   FOOT SURGERY Right 10/31/2022   LEFT HEART CATH AND CORONARY ANGIOGRAPHY Left 03/27/2018   Procedure: Left Heart Cath with  possible coronary intervention;  Surgeon: Laurier Nancy, MD;  Location: [facility]ASIVE CV LAB;  Service: Cardiovascular;  Laterality: Left;   LUMBAR FUSION  05/08/2022   TOTAL KNEE ARTHROPLASTY Right 10/08/2018   Procedure: TOTAL KNEE ARTHROPLASTY;  Surgeon: Lyndle Herrlich, MD;  Location: [facility] ORS;  Service: Orthopedics;  Laterality: Right;    Home Medications:  Allergies as of 06/12/2023       Reactions   Clonidine Derivatives Shortness Of Breath, Other (See Comments)   Low heart rate   Clonidine    Mild bradycardia + Somnolence        Medication List        Accurate as of June 12, 2023  9:40 AM. If you have any questions, ask your nurse or doctor.          acetaminophen 500 MG tablet Commonly known as: TYLENOL Take by mouth.   amLODipine 5 MG tablet Commonly known as: NORVASC Take by mouth.   aspirin EC 81 MG tablet Take 1 tablet (81 mg total) by mouth daily.   buPROPion 150 MG 24 hr tablet Commonly known as: WELLBUTRIN XL TAKE 1 TABLET BY MOUTH ONCE DAILY WITH BREAKFAST   chlorthalidone 25 MG tablet Commonly known as: HYGROTON TAKE 1 TABLET BY MOUTH DAILY   Cholecalciferol 125 MCG (5000 UT) capsule Take 5,000 Units by mouth daily.   clopidogrel 75 MG tablet Commonly known as: PLAVIX TAKE 1 TABLET BY MOUTH DAILY   clotrimazole-betamethasone cream Commonly known as: Lotrisone Apply 1 application topically 2 (two)  times daily.   cyanocobalamin 1000 MCG tablet Commonly known as: VITAMIN B12 Take 1,000 mcg by mouth daily.   diclofenac Sodium 1 % Gel Commonly known as: VOLTAREN   DULoxetine 20 MG capsule Commonly known as: CYMBALTA Take 20 mg daily along with 60 mg - total of 80 mg .   DULoxetine 60 MG capsule Commonly known as: CYMBALTA TAKE 1 CAPSULE BY MOUTH DAILY ALONG WITHTHE 20MG  FOR A TOTAL OF 80MG    dutasteride 0.5 MG capsule Commonly known as: AVODART TAKE 1 CAPSULE BY MOUTH EVERY DAY IN THEEVENING   fluticasone 50 MCG/ACT nasal  spray Commonly known as: FLONASE Place 2 sprays into both nostrils daily.   hydrALAZINE 50 MG tablet Commonly known as: APRESOLINE Take 1 tablet (50 mg total) by mouth 2 (two) times daily.   hydrocortisone 2.5 % rectal cream Commonly known as: ANUSOL-HC APPLY RECTALLY TWICE DAILY AS DIRECTED   hydrOXYzine 25 MG capsule Commonly known as: Vistaril Take 1 capsule (25 mg total) by mouth 2 (two) times daily as needed for anxiety.   losartan 50 MG tablet Commonly known as: COZAAR Take 1 tablet (50 mg total) by mouth daily.   meloxicam 15 MG tablet Commonly known as: MOBIC Take 1 tablet (15 mg total) by mouth daily.   mupirocin ointment 2 % Commonly known as: BACTROBAN APPLY 1 APPLICATION TOPICALLY 2 TIMES DAILY   niacin 1000 MG CR tablet Commonly known as: NIASPAN Take 2 tablets (2,000 mg total) by mouth at bedtime.   pantoprazole 20 MG tablet Commonly known as: PROTONIX TAKE ONE TABLET BY MOUTH EVERY DAY   rosuvastatin 40 MG tablet Commonly known as: CRESTOR Take 1 tablet (40 mg total) by mouth every evening.   tamsulosin 0.4 MG Caps capsule Commonly known as: FLOMAX Take 1 capsule (0.4 mg total) by mouth daily.   testosterone cypionate 200 MG/ML injection Commonly known as: DEPOTESTOSTERONE CYPIONATE INJECT 0.75 ML INTRAMUSCULARLY EVERY 10 DAYS AS DIRECTED   Vascepa 1 g capsule Generic drug: icosapent Ethyl TAKE 2 CAPSULES BY MOUTH TWICE DAILY        Allergies:  Allergies  Allergen Reactions   Clonidine Derivatives Shortness Of Breath and Other (See Comments)    Low heart rate   Clonidine     Mild bradycardia + Somnolence    Family History: Family History  Problem Relation Age of Onset   Diabetes Mother    Hypertension Mother    Cancer Father    Depression Father    Anxiety disorder Father    Depression Daughter     Social History:  reports that he quit smoking about 16 years ago. His smoking use included cigars. He has never used smokeless  tobacco. He reports that he does not drink alcohol and does not use drugs.   Physical Exam: BP 132/72   Pulse 66   Ht 6\' 1"  (1.854 m)   Wt 210 lb (95.3 kg)   BMI 27.71 kg/m   Constitutional:  Alert and oriented, No acute distress. HEENT: [place] AT Respiratory: Normal respiratory effort, no increased work of breathing.   Assessment & Plan:    Hypogonadism Stable on TRT Lab visit 6 months testosterone/hematocrit 1 year follow-up PSA, testosterone, hematocrit   2.  BPH with LUTS Stable.   I have reviewed the above documentation for accuracy and completeness, and I agree with the above.   Riki Altes, MD  [facility] [address], [address] [place], [place] [address] [phone]

## 2023-07-01 ENCOUNTER — Ambulatory Visit: Payer: Medicare PPO | Admitting: Psychiatry

## 2023-07-01 ENCOUNTER — Encounter: Payer: Self-pay | Admitting: Psychiatry

## 2023-07-01 VITALS — BP 132/64 | HR 76 | Temp 98.1°F | Ht 73.0 in | Wt 213.6 lb

## 2023-07-01 DIAGNOSIS — F411 Generalized anxiety disorder: Secondary | ICD-10-CM

## 2023-07-01 DIAGNOSIS — G4701 Insomnia due to medical condition: Secondary | ICD-10-CM | POA: Diagnosis not present

## 2023-07-01 DIAGNOSIS — F3342 Major depressive disorder, recurrent, in full remission: Secondary | ICD-10-CM | POA: Diagnosis not present

## 2023-07-01 MED ORDER — DULOXETINE HCL 20 MG PO CPEP
ORAL_CAPSULE | ORAL | 3 refills | Status: DC
Start: 2023-07-01 — End: 2024-04-15

## 2023-07-01 NOTE — Progress Notes (Signed)
BH MD  OP Progress Note  07/01/2023 2:41 PM Kenneth Pruitt  MRN:  [id number]  Chief Complaint:  Chief Complaint  Patient presents with   Follow-up   Anxiety   Depression   Medication Refill   HPI: Kenneth Pruitt is a 72 year old Caucasian male, retired, married, lives in [place], has a history of MDD, GAD, insomnia, coronary artery disease status post stent placement, knee pain, multiple surgeries, testosterone deficiency was evaluated in office today.  Patient today reports overall he is doing well.  Reports he was anxious about his wife who is dealing with medical issues however currently she is at a better place and that has been helpful for him to feel better as well.  Patient reports he took hydroxyzine once at night and that did relax him although he felt groggy the next day.  However that did not last too long.  Patient otherwise compliant on all his medications.  Denies side effects.  Patient reports he continues to ride his bike with his friends.  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  Has not been able to find a therapist yet.  Patient denies any other concerns today.  Visit Diagnosis:    ICD-10-CM   1. MDD (major depressive disorder), recurrent, in full remission (HCC)  F33.42     2. GAD (generalized anxiety disorder)  F41.1 DULoxetine (CYMBALTA) 20 MG capsule    3. Insomnia due to medical condition  G47.01    Obstructive sleep apnea on CPAP, pain      Past Psychiatric History: I have reviewed past psychiatric history from progress note on 12/16/2017.  Past Medical History:  Past Medical History:  Diagnosis Date   Arthritis    Coronary artery disease    Depression    Hyperlipidemia    Skin cancer    left forearm treated years ago by Dr. Vonita Moss    Past Surgical History:  Procedure Laterality Date   ANKLE FRACTURE SURGERY Right    ARTHRODESIS ANT INTERBODY INC  DISCECTOMY, CERVICAL BELOW C2    05/2019   CARDIAC CATHETERIZATION N/A 04/26/2015   Procedure: Left Heart Cath;  Surgeon: Laurier Nancy, MD;  Location: [facility] INVASIVE CV LAB;  Service: Cardiovascular;  Laterality: N/A;   CLAVICLE SURGERY Right 05/04/2021   CORONARY ANGIOPLASTY     CORONARY STENT INTERVENTION N/A 03/27/2018   Procedure: CORONARY STENT INTERVENTION;  Surgeon: Alwyn Pea, MD;  Location: [facility] INVASIVE CV LAB;  Service: Cardiovascular;  Laterality: N/A;   EYE SURGERY Bilateral    cataract   FOOT SURGERY Left 08/22/2022   FOOT SURGERY Right 10/31/2022   LEFT HEART CATH AND CORONARY ANGIOGRAPHY Left 03/27/2018   Procedure: Left Heart Cath with possible coronary intervention;  Surgeon: Laurier Nancy, MD;  Location: [facility] INVASIVE CV LAB;  Service: Cardiovascular;  Laterality: Left;   LUMBAR FUSION  05/08/2022   TOTAL KNEE ARTHROPLASTY Right 10/08/2018   Procedure: TOTAL KNEE ARTHROPLASTY;  Surgeon: Lyndle Herrlich, MD;  Location: [facility] ORS;  Service: Orthopedics;  Laterality: Right;    Family Psychiatric History: I have reviewed family psychiatric history from progress note on 12/16/2017.  Family History:  Family History  Problem Relation Age of Onset   Diabetes Mother    Hypertension Mother    Cancer Father    Depression Father    Anxiety disorder Father    Depression Daughter     Social History:  I have reviewed social history from progress note on 12/16/2017. Social History   Socioeconomic History   Marital status: Married    Spouse name: ann   Number of children: 2   Years of education: Not on file   Highest education level: Master's degree (e.g., MA, MS, MEng, MEd, MSW, MBA)  Occupational History    Comment: retired  Tobacco Use   Smoking status: Former    Types: Cigars    Quit date: 07/18/2006    Years since quitting: 16.9   Smokeless tobacco: Never  Vaping Use   Vaping status: Never Used  Substance and Sexual Activity   Alcohol use: No   Drug use:  No   Sexual activity: Not Currently  Other Topics Concern   Not on file  Social History Narrative   Not on file   Social Determinants of Health   Financial Resource Strain: Low Risk  (06/12/2022)   Overall Financial Resource Strain (CARDIA)    Difficulty of Paying Living Expenses: Not hard at all  Food Insecurity: No Food Insecurity (06/12/2022)   Hunger Vital Sign    Worried About Running Out of Food in the Last Year: Never true    Ran Out of Food in the Last Year: Never true  Transportation Needs: No Transportation Needs (06/12/2022)   PRAPARE - Administrator, Civil Service (Medical): No    Lack of Transportation (Non-Medical): No  Physical Activity: Insufficiently Active (06/12/2022)   Exercise Vital Sign    Days of Exercise per Week: 3 days    Minutes of Exercise per Session: 30 min  Stress: No Stress Concern Present (06/12/2022)   [employer] of Occupational Health - Occupational Stress Questionnaire    Feeling of Stress : Not at all  Social Connections: Moderately Integrated (06/12/2022)   Social Connection and Isolation Panel [NHANES]    Frequency of Communication with Friends and Family: Twice a week    Frequency of Social Gatherings with Friends and Family: More than three times a week    Attends Religious Services: 1 to 4 times per year    Active Member of [employer] or Organizations: No    Attends Banker Meetings: Never    Marital Status: Married    Allergies:  Allergies  Allergen Reactions   Clonidine Derivatives Shortness Of Breath and Other (See Comments)    Low heart rate   Clonidine     Mild bradycardia + Somnolence    Metabolic Disorder Labs: Lab Results  Component Value Date   HGBA1C 6.1 (H) 05/01/2022   No results found for: "PROLACTIN" Lab Results  Component Value Date   CHOL 86 (L) 05/29/2023   TRIG 106 05/29/2023   HDL 39 (L) 05/29/2023   CHOLHDL 2.5 08/20/2019   VLDL 37 03/28/2018   LDLCALC 27 05/29/2023   LDLCALC  22 11/26/2022   Lab Results  Component Value Date   TSH 1.120 11/26/2022   TSH 0.939 08/31/2021    Therapeutic Level Labs: No results found for: "LITHIUM" No results found for: "VALPROATE" No results found for: "CBMZ"  Current Medications: Current Outpatient Medications  Medication Sig Dispense Refill   acetaminophen (TYLENOL) 500 MG tablet Take by mouth.     amLODipine (NORVASC) 5 MG tablet Take by mouth.     aspirin 81 MG EC tablet Take 1 tablet (81 mg total) by mouth daily. 90 tablet 3   buPROPion (WELLBUTRIN XL) 150 MG 24 hr tablet TAKE 1 TABLET BY MOUTH ONCE  DAILY WITH BREAKFAST 90 tablet 1   chlorthalidone (HYGROTON) 25 MG tablet TAKE 1 TABLET BY MOUTH DAILY 90 tablet 0   Cholecalciferol 125 MCG (5000 UT) capsule Take 5,000 Units by mouth daily.     clopidogrel (PLAVIX) 75 MG tablet TAKE 1 TABLET BY MOUTH DAILY 90 tablet 0   clotrimazole-betamethasone (LOTRISONE) cream Apply 1 application topically 2 (two) times daily. 30 g 0   diclofenac Sodium (VOLTAREN) 1 % GEL      DULoxetine (CYMBALTA) 60 MG capsule TAKE 1 CAPSULE BY MOUTH DAILY ALONG WITHTHE 20MG  FOR A TOTAL OF 80MG  90 capsule 3   dutasteride (AVODART) 0.5 MG capsule TAKE 1 CAPSULE BY MOUTH EVERY DAY IN THEEVENING 90 capsule 1   fluticasone (FLONASE) 50 MCG/ACT nasal spray Place 2 sprays into both nostrils daily. 16 g 12   hydrALAZINE (APRESOLINE) 50 MG tablet Take 1 tablet (50 mg total) by mouth 2 (two) times daily. 120 tablet 2   hydrocortisone (ANUSOL-HC) 2.5 % rectal cream APPLY RECTALLY TWICE DAILY AS DIRECTED 30 g 0   hydrOXYzine (VISTARIL) 25 MG capsule Take 1 capsule (25 mg total) by mouth 2 (two) times daily as needed for anxiety. 60 capsule 1   losartan (COZAAR) 50 MG tablet Take 1 tablet (50 mg total) by mouth daily. 90 tablet 1   meloxicam (MOBIC) 15 MG tablet Take 1 tablet (15 mg total) by mouth daily. 90 tablet 1   mupirocin ointment (BACTROBAN) 2 % APPLY 1 APPLICATION TOPICALLY 2 TIMES DAILY 22 g 0    niacin (NIASPAN) 1000 MG CR tablet Take 2 tablets (2,000 mg total) by mouth at bedtime. 180 tablet 2   pantoprazole (PROTONIX) 20 MG tablet TAKE ONE TABLET BY MOUTH EVERY DAY 90 tablet 0   rosuvastatin (CRESTOR) 40 MG tablet Take 1 tablet (40 mg total) by mouth every evening. 90 tablet 1   tamsulosin (FLOMAX) 0.4 MG CAPS capsule Take 1 capsule (0.4 mg total) by mouth daily. 90 capsule 1   testosterone cypionate (DEPOTESTOSTERONE CYPIONATE) 200 MG/ML injection INJECT 0.75 ML INTRAMUSCULARLY EVERY 10 DAYS AS DIRECTED 10 mL 0   VASCEPA 1 g capsule TAKE 2 CAPSULES BY MOUTH TWICE DAILY 360 capsule 0   vitamin B-12 (CYANOCOBALAMIN) 1000 MCG tablet Take 1,000 mcg by mouth daily.     DULoxetine (CYMBALTA) 20 MG capsule Take 20 mg daily along with 60 mg - total of 80 mg . 90 capsule 3   No current facility-administered medications for this visit.     Musculoskeletal: Strength & Muscle Tone: within normal limits Gait & Station: normal Patient leans: N/A  Psychiatric Specialty Exam: Review of Systems  Psychiatric/Behavioral: Negative.      Blood pressure 132/64, pulse 76, temperature 98.1 F (36.7 C), temperature source Skin, height 6\' 1"  (1.854 m), weight 213 lb 9.6 oz (96.9 kg).Body mass index is 28.18 kg/m.  General Appearance: Casual  Eye Contact:  Fair  Speech:  Normal Rate  Volume:  Normal  Mood:  Euthymic  Affect:  Congruent  Thought Process:  Goal Directed and Descriptions of Associations: Intact  Orientation:  Full (Time, Place, and Person)  Thought Content: Logical   Suicidal Thoughts:  No  Homicidal Thoughts:  No  Memory:  Immediate;   Fair Recent;   Fair Remote;   Fair  Judgement:  Fair  Insight:  Fair  Psychomotor Activity:  Normal  Concentration:  Concentration: Fair and Attention Span: Fair  Recall:  Good  Fund of Knowledge: Good  Language: Fair  Akathisia:  No  Handed:  Right  AIMS (if indicated): not done  Assets:  Communication Skills Desire for  Improvement Housing Social Support Talents/Skills Transportation  ADL's:  Intact  Cognition: WNL  Sleep:  Fair   Screenings: Midwife Visit from 04/09/2022 in [facility] Office Visit from 02/06/2022 in [facility]  AIMS Total Score 0 0      GAD-7    Flowsheet Row Office Visit from 07/01/2023 in [facility] Office Visit from 05/29/2023 in [facility] Office Visit from 05/07/2023 in [facility] Office Visit from 12/17/2022 in [facility] Office Visit from 07/16/2022 in [facility]  Total GAD-7 Score 3 2 10 5 3       PHQ2-9    Flowsheet Row Office Visit from 07/01/2023 in [facility] Office Visit from 05/29/2023 in [facility] Office Visit from 05/07/2023 in [facility] Office Visit from 12/17/2022 in [facility] Office Visit from 07/16/2022 in [facility]  PHQ-2 Total Score 2 2 2 2 2   PHQ-9 Total Score 5 6 12 12 5       Flowsheet Row Office Visit from 07/01/2023 in [facility] Office Visit from 05/07/2023 in [facility] Office Visit from 12/17/2022 in [facility]  C-SSRS RISK CATEGORY No Risk No Risk No Risk        Assessment and Plan: ETHANAEL VEITH is a 72 year old Caucasian male who has a history of MDD, GAD, multiple medical problems including HLD, CAD, chronic pain, multiple other medical problems was evaluated in office today.  Patient is currently improving, plan as noted below.  Plan MDD in  remission Cymbalta 80 mg p.o. daily Wellbutrin XL 150 mg p.o. daily  GAD-improving Patient referred for CBT-pending.  Patient however as noted above has been keeping himself busy with bike riding, playing on his musical instrument, taking trips. Cymbalta 80 mg p.o. daily  Insomnia-stable Continue CPAP for OSA Continue sleep hygiene techniques.    Consent: Patient/Guardian gives verbal consent for treatment and assignment of benefits for services provided during this visit. Patient/Guardian expressed understanding and agreed to proceed.   Follow-up in clinic in 6 months or sooner if needed.  This note was generated in part or whole with voice recognition software. Voice recognition is usually quite accurate but there are transcription errors that can and very often do occur. I apologize for any typographical errors that were not detected and corrected.    Jomarie Longs, MD 07/01/2023, 2:41 PM

## 2023-07-18 DIAGNOSIS — G4733 Obstructive sleep apnea (adult) (pediatric): Secondary | ICD-10-CM | POA: Diagnosis not present

## 2023-07-29 DIAGNOSIS — I10 Essential (primary) hypertension: Secondary | ICD-10-CM | POA: Diagnosis not present

## 2023-07-29 DIAGNOSIS — G4733 Obstructive sleep apnea (adult) (pediatric): Secondary | ICD-10-CM | POA: Diagnosis not present

## 2023-08-01 ENCOUNTER — Ambulatory Visit: Payer: Medicare PPO

## 2023-08-01 DIAGNOSIS — I10 Essential (primary) hypertension: Secondary | ICD-10-CM

## 2023-08-01 DIAGNOSIS — I351 Nonrheumatic aortic (valve) insufficiency: Secondary | ICD-10-CM

## 2023-08-02 ENCOUNTER — Other Ambulatory Visit: Payer: Self-pay | Admitting: Cardiovascular Disease

## 2023-08-08 ENCOUNTER — Ambulatory Visit: Payer: Medicare PPO | Admitting: Cardiovascular Disease

## 2023-08-08 ENCOUNTER — Encounter: Payer: Self-pay | Admitting: Cardiovascular Disease

## 2023-08-08 VITALS — BP 138/74 | HR 72 | Ht 73.0 in | Wt 216.8 lb

## 2023-08-08 DIAGNOSIS — I2510 Atherosclerotic heart disease of native coronary artery without angina pectoris: Secondary | ICD-10-CM | POA: Diagnosis not present

## 2023-08-08 DIAGNOSIS — I10 Essential (primary) hypertension: Secondary | ICD-10-CM | POA: Diagnosis not present

## 2023-08-08 DIAGNOSIS — I951 Orthostatic hypotension: Secondary | ICD-10-CM

## 2023-08-08 DIAGNOSIS — G4733 Obstructive sleep apnea (adult) (pediatric): Secondary | ICD-10-CM

## 2023-08-08 NOTE — Progress Notes (Signed)
Cardiology Office Note   Date:  08/08/2023   ID:  Kenneth Pruitt, DOB 30-Mar-1951, MRN [id number]  PCP:  Dorcas Carrow, DO  Cardiologist:  Adrian Blackwater, MD      History of Present Illness: Kenneth Pruitt is a 72 y.o. male who presents for  Chief Complaint  Patient presents with   Follow-up    Doing well      Past Medical History:  Diagnosis Date   Arthritis    Coronary artery disease    Depression    Hyperlipidemia    Skin cancer    left forearm treated years ago by Dr. Vonita Moss     Past Surgical History:  Procedure Laterality Date   ANKLE FRACTURE SURGERY Right    ARTHRODESIS ANT INTERBODY INC DISCECTOMY, CERVICAL BELOW C2    05/2019   CARDIAC CATHETERIZATION N/A 04/26/2015   Procedure: Left Heart Cath;  Surgeon: Laurier Nancy, MD;  Location: [facility] INVASIVE CV LAB;  Service: Cardiovascular;  Laterality: N/A;   CLAVICLE SURGERY Right 05/04/2021   CORONARY ANGIOPLASTY     CORONARY STENT INTERVENTION N/A 03/27/2018   Procedure: CORONARY STENT INTERVENTION;  Surgeon: Alwyn Pea, MD;  Location: [facility] INVASIVE CV LAB;  Service: Cardiovascular;  Laterality: N/A;   EYE SURGERY Bilateral    cataract   FOOT SURGERY Left 08/22/2022   FOOT SURGERY Right 10/31/2022   LEFT HEART CATH AND CORONARY ANGIOGRAPHY Left 03/27/2018   Procedure: Left Heart Cath with possible coronary intervention;  Surgeon: Laurier Nancy, MD;  Location: [facility] INVASIVE CV LAB;  Service: Cardiovascular;  Laterality: Left;   LUMBAR FUSION  05/08/2022   TOTAL KNEE ARTHROPLASTY Right 10/08/2018   Procedure: TOTAL KNEE ARTHROPLASTY;  Surgeon: Lyndle Herrlich, MD;  Location: [facility] ORS;  Service: Orthopedics;  Laterality: Right;     Current Outpatient Medications  Medication Sig Dispense Refill   acetaminophen (TYLENOL) 500 MG tablet Take by mouth.     aspirin 81 MG EC tablet Take 1 tablet (81 mg total) by mouth daily. 90 tablet 3   buPROPion (WELLBUTRIN XL) 150 MG 24 hr tablet TAKE  1 TABLET BY MOUTH ONCE DAILY WITH BREAKFAST 90 tablet 1   chlorthalidone (HYGROTON) 25 MG tablet TAKE 1 TABLET BY MOUTH DAILY 90 tablet 0   Cholecalciferol 125 MCG (5000 UT) capsule Take 5,000 Units by mouth daily.     clopidogrel (PLAVIX) 75 MG tablet TAKE 1 TABLET BY MOUTH DAILY 90 tablet 0   clotrimazole-betamethasone (LOTRISONE) cream Apply 1 application topically 2 (two) times daily. 30 g 0   diclofenac Sodium (VOLTAREN) 1 % GEL      DULoxetine (CYMBALTA) 20 MG capsule Take 20 mg daily along with 60 mg - total of 80 mg . 90 capsule 3   DULoxetine (CYMBALTA) 60 MG capsule TAKE 1 CAPSULE BY MOUTH DAILY ALONG WITHTHE 20MG  FOR A TOTAL OF 80MG  90 capsule 3   dutasteride (AVODART) 0.5 MG capsule TAKE 1 CAPSULE BY MOUTH EVERY DAY IN THEEVENING 90 capsule 1   fluticasone (FLONASE) 50 MCG/ACT nasal spray Place 2 sprays into both nostrils daily. 16 g 12   hydrALAZINE (APRESOLINE) 50 MG tablet Take 1 tablet (50 mg total) by mouth 2 (two) times daily. 120 tablet 2   hydrocortisone (ANUSOL-HC) 2.5 % rectal cream APPLY RECTALLY TWICE DAILY AS DIRECTED 30 g 0   hydrOXYzine (VISTARIL) 25 MG capsule Take 1 capsule (25 mg total) by mouth 2 (two) times daily as needed for  anxiety. 60 capsule 1   losartan (COZAAR) 50 MG tablet Take 1 tablet (50 mg total) by mouth daily. 90 tablet 1   meloxicam (MOBIC) 15 MG tablet Take 1 tablet (15 mg total) by mouth daily. 90 tablet 1   mupirocin ointment (BACTROBAN) 2 % APPLY 1 APPLICATION TOPICALLY 2 TIMES DAILY 22 g 0   niacin (NIASPAN) 1000 MG CR tablet Take 2 tablets (2,000 mg total) by mouth at bedtime. 180 tablet 2   pantoprazole (PROTONIX) 20 MG tablet TAKE ONE TABLET BY MOUTH EVERY DAY 90 tablet 0   rosuvastatin (CRESTOR) 40 MG tablet Take 1 tablet (40 mg total) by mouth every evening. 90 tablet 1   tamsulosin (FLOMAX) 0.4 MG CAPS capsule Take 1 capsule (0.4 mg total) by mouth daily. 90 capsule 1   testosterone cypionate (DEPOTESTOSTERONE CYPIONATE) 200 MG/ML  injection INJECT 0.75 ML INTRAMUSCULARLY EVERY 10 DAYS AS DIRECTED 10 mL 0   VASCEPA 1 g capsule TAKE 2 CAPSULES BY MOUTH TWICE DAILY 360 capsule 0   vitamin B-12 (CYANOCOBALAMIN) 1000 MCG tablet Take 1,000 mcg by mouth daily.     No current facility-administered medications for this visit.    Allergies:   Clonidine derivatives and Clonidine    Social History:   reports that he quit smoking about 17 years ago. His smoking use included cigars. He has never used smokeless tobacco. He reports that he does not drink alcohol and does not use drugs.   Family History:  family history includes Anxiety disorder in his father; Cancer in his father; Depression in his daughter and father; Diabetes in his mother; Hypertension in his mother.    ROS:     Review of Systems  Constitutional: Negative.   HENT: Negative.    Eyes: Negative.   Respiratory: Negative.    Gastrointestinal: Negative.   Genitourinary: Negative.   Musculoskeletal: Negative.   Skin: Negative.   Neurological: Negative.   Endo/Heme/Allergies: Negative.   Psychiatric/Behavioral: Negative.    All other systems reviewed and are negative.     All other systems are reviewed and negative.    PHYSICAL EXAM: VS:  BP 138/74   Pulse 72   Ht 6\' 1"  (1.854 m)   Wt 216 lb 12.8 oz (98.3 kg)   SpO2 98%   BMI 28.60 kg/m  , BMI Body mass index is 28.6 kg/m. Last weight:  Wt Readings from Last 3 Encounters:  08/08/23 216 lb 12.8 oz (98.3 kg)  06/12/23 210 lb (95.3 kg)  05/29/23 211 lb 12.8 oz (96.1 kg)     Physical Exam Vitals reviewed.  Constitutional:      Appearance: Normal appearance. He is normal weight.  HENT:     Head: Normocephalic.     Nose: Nose normal.     Mouth/Throat:     Mouth: Mucous membranes are moist.  Eyes:     Pupils: Pupils are equal, round, and reactive to light.  Cardiovascular:     Rate and Rhythm: Normal rate and regular rhythm.     Pulses: Normal pulses.     Heart sounds: Normal heart  sounds.  Pulmonary:     Effort: Pulmonary effort is normal.  Abdominal:     General: Abdomen is flat. Bowel sounds are normal.  Musculoskeletal:        General: Normal range of motion.     Cervical back: Normal range of motion.  Skin:    General: Skin is warm.  Neurological:     General: No focal  deficit present.     Mental Status: He is alert.  Psychiatric:        Mood and Affect: Mood normal.       EKG:   Recent Labs: 11/26/2022: ALT 17; TSH 1.120 05/29/2023: BUN 20; Creatinine, Ser 1.27; Hemoglobin 15.4; Platelets 209; Potassium 3.5; Sodium 142    Lipid Panel    Component Value Date/Time   CHOL 86 (L) 05/29/2023 1101   CHOL 115 08/06/2016 0954   TRIG 106 05/29/2023 1101   TRIG 188 (H) 08/06/2016 0954   HDL 39 (L) 05/29/2023 1101   CHOLHDL 2.5 08/20/2019 0836   CHOLHDL 3.3 03/28/2018 0854   VLDL 37 03/28/2018 0854   VLDL 38 (H) 08/06/2016 0954   LDLCALC 27 05/29/2023 1101      Other studies Reviewed: Additional studies/ records that were reviewed today include:  Review of the above records demonstrates:       No data to display            ASSESSMENT AND PLAN:    ICD-10-CM   1. Coronary artery disease involving native coronary artery of native heart without angina pectoris  I25.10    stable, echo EF 70% trace to mild MR    2. Essential hypertension  I10    repeat 132/70    3. Orthostasis  I95.1     4. OSA on CPAP  G47.33        Problem List Items Addressed This Visit       Cardiovascular and Mediastinum   Essential hypertension   CAD (coronary artery disease) - Primary   Orthostasis     Respiratory   OSA on CPAP       Disposition:   Return in about 4 months (around 12/06/2023).    Total time spent: 30 minutes  Signed,  Adrian Blackwater, MD  08/08/2023 9:19 AM    [facility]

## 2023-08-17 DIAGNOSIS — G4733 Obstructive sleep apnea (adult) (pediatric): Secondary | ICD-10-CM | POA: Diagnosis not present

## 2023-08-23 ENCOUNTER — Other Ambulatory Visit: Payer: Self-pay | Admitting: Cardiovascular Disease

## 2023-09-04 ENCOUNTER — Other Ambulatory Visit: Payer: Self-pay | Admitting: *Deleted

## 2023-09-04 ENCOUNTER — Ambulatory Visit: Payer: Medicare PPO | Admitting: Dermatology

## 2023-09-04 DIAGNOSIS — D751 Secondary polycythemia: Secondary | ICD-10-CM

## 2023-09-05 ENCOUNTER — Inpatient Hospital Stay: Payer: Medicare PPO

## 2023-09-05 ENCOUNTER — Inpatient Hospital Stay: Payer: Medicare PPO | Attending: Oncology

## 2023-09-05 DIAGNOSIS — D751 Secondary polycythemia: Secondary | ICD-10-CM | POA: Diagnosis not present

## 2023-09-05 LAB — CBC WITH DIFFERENTIAL (CANCER CENTER ONLY)
Abs Immature Granulocytes: 0.02 10*3/uL (ref 0.00–0.07)
Basophils Absolute: 0.1 10*3/uL (ref 0.0–0.1)
Basophils Relative: 1 %
Eosinophils Absolute: 0.3 10*3/uL (ref 0.0–0.5)
Eosinophils Relative: 6 %
HCT: 48.3 % (ref 39.0–52.0)
Hemoglobin: 16 g/dL (ref 13.0–17.0)
Immature Granulocytes: 0 %
Lymphocytes Relative: 26 %
Lymphs Abs: 1.3 10*3/uL (ref 0.7–4.0)
MCH: 28.8 pg (ref 26.0–34.0)
MCHC: 33.1 g/dL (ref 30.0–36.0)
MCV: 87 fL (ref 80.0–100.0)
Monocytes Absolute: 0.8 10*3/uL (ref 0.1–1.0)
Monocytes Relative: 15 %
Neutro Abs: 2.6 10*3/uL (ref 1.7–7.7)
Neutrophils Relative %: 52 %
Platelet Count: 209 10*3/uL (ref 150–400)
RBC: 5.55 MIL/uL (ref 4.22–5.81)
RDW: 14.6 % (ref 11.5–15.5)
WBC Count: 5 10*3/uL (ref 4.0–10.5)
nRBC: 0 % (ref 0.0–0.2)

## 2023-09-05 NOTE — Progress Notes (Signed)
Hgb 16.0. phlebotomy held.

## 2023-09-06 ENCOUNTER — Other Ambulatory Visit: Payer: Self-pay | Admitting: Cardiovascular Disease

## 2023-09-06 DIAGNOSIS — E782 Mixed hyperlipidemia: Secondary | ICD-10-CM

## 2023-09-17 DIAGNOSIS — G4733 Obstructive sleep apnea (adult) (pediatric): Secondary | ICD-10-CM | POA: Diagnosis not present

## 2023-10-01 ENCOUNTER — Telehealth: Payer: Self-pay

## 2023-10-01 DIAGNOSIS — F3342 Major depressive disorder, recurrent, in full remission: Secondary | ICD-10-CM

## 2023-10-01 DIAGNOSIS — H6122 Impacted cerumen, left ear: Secondary | ICD-10-CM | POA: Diagnosis not present

## 2023-10-01 DIAGNOSIS — Z6827 Body mass index (BMI) 27.0-27.9, adult: Secondary | ICD-10-CM | POA: Diagnosis not present

## 2023-10-01 MED ORDER — BUPROPION HCL ER (XL) 150 MG PO TB24: 150.0000 mg | ORAL_TABLET | Freq: Every day | ORAL | 3 refills | Status: DC

## 2023-10-01 NOTE — Telephone Encounter (Signed)
 received fax requesting a refill on the bupropion hcl er 150mg . pt was last seen on 10-14 next appt 4-14

## 2023-10-01 NOTE — Telephone Encounter (Signed)
 I have sent bupropion to pharmacy with refills.

## 2023-10-02 NOTE — Telephone Encounter (Signed)
 Pt.notified

## 2023-10-18 DIAGNOSIS — G4733 Obstructive sleep apnea (adult) (pediatric): Secondary | ICD-10-CM | POA: Diagnosis not present

## 2023-10-28 ENCOUNTER — Other Ambulatory Visit: Payer: Self-pay | Admitting: Cardiovascular Disease

## 2023-11-02 ENCOUNTER — Other Ambulatory Visit: Payer: Self-pay | Admitting: Urology

## 2023-11-02 DIAGNOSIS — E291 Testicular hypofunction: Secondary | ICD-10-CM

## 2023-11-13 ENCOUNTER — Ambulatory Visit: Payer: Medicare PPO | Admitting: Nurse Practitioner

## 2023-11-13 VITALS — BP 123/73 | HR 76 | Temp 97.6°F | Ht 73.0 in | Wt 219.0 lb

## 2023-11-13 DIAGNOSIS — L0292 Furuncle, unspecified: Secondary | ICD-10-CM | POA: Diagnosis not present

## 2023-11-13 MED ORDER — SULFAMETHOXAZOLE-TRIMETHOPRIM 800-160 MG PO TABS: 1.0000 | ORAL_TABLET | Freq: Two times a day (BID) | ORAL | 0 refills | Status: DC

## 2023-11-13 NOTE — Progress Notes (Signed)
 BP 123/73 (BP Location: Left Arm, Patient Position: Sitting, Cuff Size: Large)   Pulse 76   Temp 97.6 F (36.4 C)   Ht 6\' 1"  (1.854 m)   Wt 219 lb (99.3 kg)   SpO2 97%   BMI 28.89 kg/m    Subjective:    Patient ID: Kenneth Pruitt, male    DOB: [birth year], 73 y.o.   MRN: [id number]  HPI: Kenneth Pruitt is a 73 y.o. male  Chief Complaint  Patient presents with   Recurrent Skin Infections    Upper back--Boil-redness, sore w/ pressure, itching-over 1 year   Patient states he has a place on his upper back.  States it has red, sore, and feels a lot of pressure, itchy. States you really have to press on it for it to be painful.   He see's Dr. Gwen Pounds for his routine skin care.  He was told that he has a cyst for several years and may needs to be removed.     Relevant past medical, surgical, family and social history reviewed and updated as indicated. Interim medical history since our last visit reviewed. Allergies and medications reviewed and updated.  Review of Systems  Skin:        Red, sore, pressure feeling in right upper back    Per HPI unless specifically indicated above     Objective:    BP 123/73 (BP Location: Left Arm, Patient Position: Sitting, Cuff Size: Large)   Pulse 76   Temp 97.6 F (36.4 C)   Ht 6\' 1"  (1.854 m)   Wt 219 lb (99.3 kg)   SpO2 97%   BMI 28.89 kg/m   Wt Readings from Last 3 Encounters:  11/13/23 219 lb (99.3 kg)  08/08/23 216 lb 12.8 oz (98.3 kg)  06/12/23 210 lb (95.3 kg)    Physical Exam Vitals and nursing note reviewed.  Constitutional:      General: He is not in acute distress.    Appearance: Normal appearance. He is not ill-appearing, toxic-appearing or diaphoretic.  HENT:     Head: Normocephalic.     Right Ear: External ear normal.     Left Ear: External ear normal.     Nose: Nose normal. No congestion or rhinorrhea.     Mouth/Throat:     Mouth: Mucous membranes are moist.  Eyes:     General:        Right eye: No  discharge.        Left eye: No discharge.     Extraocular Movements: Extraocular movements intact.     Conjunctiva/sclera: Conjunctivae normal.     Pupils: Pupils are equal, round, and reactive to light.  Cardiovascular:     Rate and Rhythm: Normal rate and regular rhythm.     Heart sounds: No murmur heard. Pulmonary:     Effort: Pulmonary effort is normal. No respiratory distress.     Breath sounds: Normal breath sounds. No wheezing, rhonchi or rales.  Abdominal:     General: Abdomen is flat. Bowel sounds are normal.  Musculoskeletal:     Cervical back: Normal range of motion and neck supple.  Skin:    General: Skin is warm and dry.     Capillary Refill: Capillary refill takes less than 2 seconds.       Neurological:     General: No focal deficit present.     Mental Status: He is alert and oriented to person, place, and time.  Psychiatric:  Mood and Affect: Mood normal.        Behavior: Behavior normal.        Thought Content: Thought content normal.        Judgment: Judgment normal.     Results for orders placed or performed in visit on 09/05/23  CBC with Differential (Cancer Center Only)   Collection Time: 09/05/23  1:06 PM  Result Value Ref Range   WBC Count 5.0 4.0 - 10.5 K/uL   RBC 5.55 4.22 - 5.81 MIL/uL   Hemoglobin 16.0 13.0 - 17.0 g/dL   HCT 40.3 47.4 - 25.9 %   MCV 87.0 80.0 - 100.0 fL   MCH 28.8 26.0 - 34.0 pg   MCHC 33.1 30.0 - 36.0 g/dL   RDW 56.3 87.5 - 64.3 %   Platelet Count 209 150 - 400 K/uL   nRBC 0.0 0.0 - 0.2 %   Neutrophils Relative % 52 %   Neutro Abs 2.6 1.7 - 7.7 K/uL   Lymphocytes Relative 26 %   Lymphs Abs 1.3 0.7 - 4.0 K/uL   Monocytes Relative 15 %   Monocytes Absolute 0.8 0.1 - 1.0 K/uL   Eosinophils Relative 6 %   Eosinophils Absolute 0.3 0.0 - 0.5 K/uL   Basophils Relative 1 %   Basophils Absolute 0.1 0.0 - 0.1 K/uL   Immature Granulocytes 0 %   Abs Immature Granulocytes 0.02 0.00 - 0.07 K/uL      Assessment & Plan:    Problem List Items Addressed This Visit   None Visit Diagnoses       Boil    -  Primary   On right upper back. Will treat with Bactrim.  Complete course of antibiotics.  Follow up with PCP in 1 week.   Relevant Medications   sulfamethoxazole-trimethoprim (BACTRIM DS) 800-160 MG tablet        Follow up plan: Return in about 1 week (around 11/20/2023) for Follow up boil on back with Dr Laural Benes.

## 2023-11-14 ENCOUNTER — Telehealth: Payer: Self-pay | Admitting: Urology

## 2023-11-14 NOTE — Telephone Encounter (Signed)
 Pt states we will need to get a PA prior to him getting his testosterone refill from [facility].  He is out and will need a refill.

## 2023-11-14 NOTE — Telephone Encounter (Signed)
 When the pa comes in . We will do the prior auth on the medication

## 2023-11-15 DIAGNOSIS — G4733 Obstructive sleep apnea (adult) (pediatric): Secondary | ICD-10-CM | POA: Diagnosis not present

## 2023-11-20 ENCOUNTER — Ambulatory Visit: Payer: Medicare PPO | Admitting: Dermatology

## 2023-11-21 ENCOUNTER — Ambulatory Visit: Payer: Medicare PPO | Admitting: Family Medicine

## 2023-11-21 ENCOUNTER — Encounter: Payer: Self-pay | Admitting: Family Medicine

## 2023-11-21 VITALS — BP 108/64 | HR 102 | Temp 98.3°F | Resp 18 | Wt 218.8 lb

## 2023-11-21 DIAGNOSIS — M7989 Other specified soft tissue disorders: Secondary | ICD-10-CM | POA: Diagnosis not present

## 2023-11-21 DIAGNOSIS — L0292 Furuncle, unspecified: Secondary | ICD-10-CM

## 2023-11-21 MED ORDER — SULFAMETHOXAZOLE-TRIMETHOPRIM 800-160 MG PO TABS
1.0000 | ORAL_TABLET | Freq: Two times a day (BID) | ORAL | 0 refills | Status: DC
Start: 2023-11-21 — End: 2023-11-28

## 2023-11-21 NOTE — Progress Notes (Signed)
 BP 108/64 (BP Location: Left Arm, Patient Position: Sitting, Cuff Size: Large)   Pulse (!) 102   Temp 98.3 F (36.8 C) (Oral)   Resp 18   Wt 218 lb 12.8 oz (99.2 kg)   SpO2 94%   BMI 28.87 kg/m    Subjective:    Patient ID: Kenneth Pruitt, male    DOB: [birth year], 73 y.o.   MRN: [id number]  HPI: Kenneth Pruitt is a 73 y.o. male  Chief Complaint  Patient presents with   Follow-up    Didn't open. Only notices when he leans on his back. Concerned its been about 6 weeks ago also having mouth sore. Seen dentist yesterday that they want him to checked. Also mentions swelling in right hand 2nd finger. Says it has reduced some.    ABSCESS Duration:  about a week Location: R middle back Pain:  no Quality:  aching and sore Severity: mild Redness:  yes Swelling:  yes Warmth:  yes Oozing:  no Pus:  no Treatments attempted:antibiotics Past similar infections:  no Past MRSA skin infections:  no History of trauma in area:  no Fevers:  no Nausea/vomiting:  no  Has a sore in his mouth. Working with dentistry- they are going to recheck in a couple weeks and if not better, going to see oral surgery.  Was having swelling in his R index finger for a couple of weeks. It is getting better- but not 100%.   Relevant past medical, surgical, family and social history reviewed and updated as indicated. Interim medical history since our last visit reviewed. Allergies and medications reviewed and updated.  Review of Systems  Constitutional: Negative.   Respiratory: Negative.    Cardiovascular: Negative.   Musculoskeletal:  Positive for myalgias. Negative for arthralgias, back pain, gait problem, joint swelling, neck pain and neck stiffness.  Skin:  Positive for wound. Negative for color change, pallor and rash.  Psychiatric/Behavioral: Negative.      Per HPI unless specifically indicated above     Objective:    BP 108/64 (BP Location: Left Arm, Patient Position: Sitting, Cuff Size:  Large)   Pulse (!) 102   Temp 98.3 F (36.8 C) (Oral)   Resp 18   Wt 218 lb 12.8 oz (99.2 kg)   SpO2 94%   BMI 28.87 kg/m   Wt Readings from Last 3 Encounters:  11/21/23 218 lb 12.8 oz (99.2 kg)  11/13/23 219 lb (99.3 kg)  08/08/23 216 lb 12.8 oz (98.3 kg)    Physical Exam Vitals and nursing note reviewed.  Constitutional:      General: He is not in acute distress.    Appearance: Normal appearance. He is not ill-appearing, toxic-appearing or diaphoretic.  HENT:     Head: Normocephalic and atraumatic.     Right Ear: External ear normal.     Left Ear: External ear normal.     Nose: Nose normal.     Mouth/Throat:     Mouth: Mucous membranes are moist.     Pharynx: Oropharynx is clear.  Eyes:     General: No scleral icterus.       Right eye: No discharge.        Left eye: No discharge.     Extraocular Movements: Extraocular movements intact.     Conjunctiva/sclera: Conjunctivae normal.     Pupils: Pupils are equal, round, and reactive to light.  Cardiovascular:     Rate and Rhythm: Normal rate and regular rhythm.  Pulses: Normal pulses.     Heart sounds: Normal heart sounds. No murmur heard.    No friction rub. No gallop.  Pulmonary:     Effort: Pulmonary effort is normal. No respiratory distress.     Breath sounds: Normal breath sounds. No stridor. No wheezing, rhonchi or rales.  Chest:     Chest wall: No tenderness.  Musculoskeletal:        General: Normal range of motion.     Cervical back: Normal range of motion and neck supple.  Skin:    General: Skin is warm and dry.     Capillary Refill: Capillary refill takes less than 2 seconds.     Coloration: Skin is not jaundiced or pale.     Findings: No bruising, erythema, lesion or rash.     Comments: 2 inch red, raised swollen lesion on R back, mild fluctuance  Neurological:     General: No focal deficit present.     Mental Status: He is alert and oriented to person, place, and time. Mental status is at baseline.   Psychiatric:        Mood and Affect: Mood normal.        Behavior: Behavior normal.        Thought Content: Thought content normal.        Judgment: Judgment normal.     Results for orders placed or performed in visit on 09/05/23  CBC with Differential (Cancer Center Only)   Collection Time: 09/05/23  1:06 PM  Result Value Ref Range   WBC Count 5.0 4.0 - 10.5 K/uL   RBC 5.55 4.22 - 5.81 MIL/uL   Hemoglobin 16.0 13.0 - 17.0 g/dL   HCT 16.1 09.6 - 04.5 %   MCV 87.0 80.0 - 100.0 fL   MCH 28.8 26.0 - 34.0 pg   MCHC 33.1 30.0 - 36.0 g/dL   RDW 40.9 81.1 - 91.4 %   Platelet Count 209 150 - 400 K/uL   nRBC 0.0 0.0 - 0.2 %   Neutrophils Relative % 52 %   Neutro Abs 2.6 1.7 - 7.7 K/uL   Lymphocytes Relative 26 %   Lymphs Abs 1.3 0.7 - 4.0 K/uL   Monocytes Relative 15 %   Monocytes Absolute 0.8 0.1 - 1.0 K/uL   Eosinophils Relative 6 %   Eosinophils Absolute 0.3 0.0 - 0.5 K/uL   Basophils Relative 1 %   Basophils Absolute 0.1 0.0 - 0.1 K/uL   Immature Granulocytes 0 %   Abs Immature Granulocytes 0.02 0.00 - 0.07 K/uL      Assessment & Plan:   Problem List Items Addressed This Visit   None Visit Diagnoses       Boil    -  Primary   Improving. Will extend bactrim to 14 days and recheck in 1 week. Call with any concerns.   Relevant Medications   sulfamethoxazole-trimethoprim (BACTRIM DS) 800-160 MG tablet     Finger swelling       Improving on bactrim. Continue to monitor- if not better next visit will refer to hand specialist.        Follow up plan: Return for As scheduled.

## 2023-11-22 ENCOUNTER — Other Ambulatory Visit: Payer: Self-pay | Admitting: Family Medicine

## 2023-11-22 DIAGNOSIS — I10 Essential (primary) hypertension: Secondary | ICD-10-CM

## 2023-11-25 NOTE — Telephone Encounter (Signed)
 Requested Prescriptions  Pending Prescriptions Disp Refills   losartan (COZAAR) 50 MG tablet [Pharmacy Med Name: LOSARTAN POTASSIUM 50 MG TAB] 90 tablet 0    Sig: TAKE 1 TABLET BY MOUTH ONCE DAILY     Cardiovascular:  Angiotensin Receptor Blockers Passed - 11/25/2023  9:22 AM      Passed - Cr in normal range and within 180 days    Creatinine, Ser  Date Value Ref Range Status  05/29/2023 1.27 0.76 - 1.27 mg/dL Final         Passed - K in normal range and within 180 days    Potassium  Date Value Ref Range Status  05/29/2023 3.5 3.5 - 5.2 mmol/L Final         Passed - Patient is not pregnant      Passed - Last BP in normal range    BP Readings from Last 1 Encounters:  11/21/23 108/64         Passed - Valid encounter within last 6 months    Recent Outpatient Visits           6 months ago Benign prostatic hyperplasia with urinary frequency   [facility] [place], [name], DO   12 months ago Routine general medical examination at a health care facility   [facility] [place], [name], DO   1 year ago Preop exam for internal medicine   [facility] Dorcas Carrow, DO   1 year ago Essential hypertension   [facility] [place], [place], DO   1 year ago Pre-op evaluation   [facility] Dorcas Carrow, DO       Future Appointments             In 3 days Dorcas Carrow, DO [facility]   In 2 weeks Laurier Nancy, MD [facility]   In 2 months Deirdre Evener, MD [facility]   In 6 months [name], Verna Czech, MD [facility] [place]

## 2023-11-28 ENCOUNTER — Encounter: Payer: Self-pay | Admitting: Family Medicine

## 2023-11-28 ENCOUNTER — Ambulatory Visit (INDEPENDENT_AMBULATORY_CARE_PROVIDER_SITE_OTHER): Payer: Medicare PPO | Admitting: Family Medicine

## 2023-11-28 VITALS — BP 134/69 | HR 81 | Temp 98.9°F | Resp 16 | Ht 72.99 in | Wt 221.8 lb

## 2023-11-28 DIAGNOSIS — D751 Secondary polycythemia: Secondary | ICD-10-CM

## 2023-11-28 DIAGNOSIS — I10 Essential (primary) hypertension: Secondary | ICD-10-CM | POA: Diagnosis not present

## 2023-11-28 DIAGNOSIS — E782 Mixed hyperlipidemia: Secondary | ICD-10-CM

## 2023-11-28 DIAGNOSIS — E291 Testicular hypofunction: Secondary | ICD-10-CM | POA: Diagnosis not present

## 2023-11-28 DIAGNOSIS — R972 Elevated prostate specific antigen [PSA]: Secondary | ICD-10-CM | POA: Diagnosis not present

## 2023-11-28 DIAGNOSIS — D692 Other nonthrombocytopenic purpura: Secondary | ICD-10-CM | POA: Insufficient documentation

## 2023-11-28 DIAGNOSIS — Z0000 Encounter for general adult medical examination without abnormal findings: Secondary | ICD-10-CM | POA: Diagnosis not present

## 2023-11-28 DIAGNOSIS — R350 Frequency of micturition: Secondary | ICD-10-CM

## 2023-11-28 DIAGNOSIS — F3342 Major depressive disorder, recurrent, in full remission: Secondary | ICD-10-CM

## 2023-11-28 DIAGNOSIS — N401 Enlarged prostate with lower urinary tract symptoms: Secondary | ICD-10-CM

## 2023-11-28 MED ORDER — FLUTICASONE PROPIONATE 50 MCG/ACT NA SUSP: 2.0000 | Freq: Every day | NASAL | 12 refills | Status: AC

## 2023-11-28 MED ORDER — NIACIN ER (ANTIHYPERLIPIDEMIC) 1000 MG PO TBCR: 2000.0000 mg | EXTENDED_RELEASE_TABLET | Freq: Every day | ORAL | 2 refills | Status: DC

## 2023-11-28 MED ORDER — SULFAMETHOXAZOLE-TRIMETHOPRIM 800-160 MG PO TABS: 1.0000 | ORAL_TABLET | Freq: Two times a day (BID) | ORAL | 0 refills | Status: DC

## 2023-11-28 MED ORDER — ICOSAPENT ETHYL 1 G PO CAPS: 2.0000 g | ORAL_CAPSULE | Freq: Two times a day (BID) | ORAL | 1 refills | Status: DC

## 2023-11-28 MED ORDER — CLOPIDOGREL BISULFATE 75 MG PO TABS: 75.0000 mg | ORAL_TABLET | Freq: Every day | ORAL | 1 refills | Status: DC

## 2023-11-28 MED ORDER — PANTOPRAZOLE SODIUM 20 MG PO TBEC: 20.0000 mg | DELAYED_RELEASE_TABLET | Freq: Every day | ORAL | 1 refills | Status: DC

## 2023-11-28 MED ORDER — DUTASTERIDE 0.5 MG PO CAPS: ORAL_CAPSULE | ORAL | 1 refills | Status: DC

## 2023-11-28 MED ORDER — ROSUVASTATIN CALCIUM 40 MG PO TABS: 40.0000 mg | ORAL_TABLET | Freq: Every evening | ORAL | 1 refills | Status: DC

## 2023-11-28 MED ORDER — MELOXICAM 15 MG PO TABS: 15.0000 mg | ORAL_TABLET | Freq: Every day | ORAL | 1 refills | Status: DC

## 2023-11-28 MED ORDER — HYDRALAZINE HCL 50 MG PO TABS: 50.0000 mg | ORAL_TABLET | Freq: Two times a day (BID) | ORAL | 1 refills | Status: DC

## 2023-11-28 MED ORDER — CHLORTHALIDONE 25 MG PO TABS: 25.0000 mg | ORAL_TABLET | Freq: Every day | ORAL | 1 refills | Status: DC

## 2023-11-28 MED ORDER — LOSARTAN POTASSIUM 50 MG PO TABS: 50.0000 mg | ORAL_TABLET | Freq: Every day | ORAL | 1 refills | Status: DC

## 2023-11-28 MED ORDER — TAMSULOSIN HCL 0.4 MG PO CAPS
0.4000 mg | ORAL_CAPSULE | Freq: Every day | ORAL | 1 refills | Status: DC
Start: 2023-11-28 — End: 2024-06-02

## 2023-11-28 NOTE — Patient Instructions (Signed)
  Kenneth Pruitt , Thank you for taking time to come for your Medicare Wellness Visit. I appreciate your ongoing commitment to your health goals. Please review the following plan we discussed and let me know if I can assist you in the future.   These are the goals we discussed:  Goals      DIET - INCREASE WATER INTAKE     Recommend drinking at least 6-8 glasses of water a day      Patient Stated     Get health so can ride bike 500 miles next year        This is a list of the screening recommended for you and due dates:  Health Maintenance  Topic Date Due   COVID-19 Vaccine (6 - 2024-25 season) 05/19/2023   Flu Shot  12/16/2023*   Medicare Annual Wellness Visit  11/27/2024   Cologuard (Stool DNA test)  03/06/2026   DTaP/Tdap/Td vaccine (3 - Td or Tdap) 04/23/2031   Pneumonia Vaccine  Completed   Hepatitis C Screening  Completed   Zoster (Shingles) Vaccine  Completed   HPV Vaccine  Aged Out   Colon Cancer Screening  Discontinued  *Topic was postponed. The date shown is not the original due date.

## 2023-11-28 NOTE — Progress Notes (Unsigned)
 Subjective:   Kenneth Pruitt is a 73 y.o. male who presents for Medicare Annual/Subsequent preventive examination.  Visit Complete: In person  Patient Medicare AWV questionnaire was completed by the patient on 11/28/23; I have confirmed that all information answered by patient is correct and no changes since this date.        Objective:    Today's Vitals   11/28/23 0946 11/28/23 0947  BP: 134/69   Pulse: 81   Resp: 16   Temp: 98.9 F (37.2 C)   TempSrc: Oral   SpO2: 98%   Weight: 221 lb 12.8 oz (100.6 kg)   Height: 6' 0.99" (1.854 m)   PainSc: 0-No pain 0-No pain   Body mass index is 29.27 kg/m.     11/28/2023    9:50 AM 03/05/2023    1:02 PM 09/04/2022    1:11 PM 06/12/2022    9:35 AM 02/27/2022    1:06 PM 12/12/2021    9:12 AM 09/25/2021    1:11 PM  Advanced Directives  Does Patient Have a Medical Advance Directive? No No No No No No No  Would patient like information on creating a medical advance directive? Yes (MAU/Ambulatory/Procedural Areas - Information given)   No - Patient declined No - Patient declined No - Patient declined No - Patient declined    Current Medications (verified) Outpatient Encounter Medications as of 11/28/2023  Medication Sig   acetaminophen (TYLENOL) 500 MG tablet Take by mouth.   aspirin 81 MG EC tablet Take 1 tablet (81 mg total) by mouth daily.   buPROPion (WELLBUTRIN XL) 150 MG 24 hr tablet Take 1 tablet (150 mg total) by mouth daily with breakfast.   chlorthalidone (HYGROTON) 25 MG tablet TAKE 1 TABLET BY MOUTH DAILY   Cholecalciferol 125 MCG (5000 UT) capsule Take 5,000 Units by mouth daily.   clopidogrel (PLAVIX) 75 MG tablet TAKE 1 TABLET BY MOUTH DAILY   clotrimazole-betamethasone (LOTRISONE) cream Apply 1 application topically 2 (two) times daily.   diclofenac Sodium (VOLTAREN) 1 % GEL    DULoxetine (CYMBALTA) 20 MG capsule Take 20 mg daily along with 60 mg - total of 80 mg .   DULoxetine (CYMBALTA) 60 MG capsule TAKE 1 CAPSULE  BY MOUTH DAILY ALONG WITHTHE 20MG  FOR A TOTAL OF 80MG    dutasteride (AVODART) 0.5 MG capsule TAKE 1 CAPSULE BY MOUTH EVERY DAY IN THEEVENING   fluticasone (FLONASE) 50 MCG/ACT nasal spray Place 2 sprays into both nostrils daily.   hydrALAZINE (APRESOLINE) 50 MG tablet Take 1 tablet (50 mg total) by mouth 2 (two) times daily.   hydrocortisone (ANUSOL-HC) 2.5 % rectal cream APPLY RECTALLY TWICE DAILY AS DIRECTED   hydrOXYzine (VISTARIL) 25 MG capsule Take 1 capsule (25 mg total) by mouth 2 (two) times daily as needed for anxiety.   losartan (COZAAR) 50 MG tablet TAKE 1 TABLET BY MOUTH ONCE DAILY   meloxicam (MOBIC) 15 MG tablet Take 1 tablet (15 mg total) by mouth daily.   mupirocin ointment (BACTROBAN) 2 % APPLY 1 APPLICATION TOPICALLY 2 TIMES DAILY   niacin (NIASPAN) 1000 MG CR tablet Take 2 tablets (2,000 mg total) by mouth at bedtime.   pantoprazole (PROTONIX) 20 MG tablet TAKE ONE TABLET BY MOUTH EVERY DAY   rosuvastatin (CRESTOR) 40 MG tablet Take 1 tablet (40 mg total) by mouth every evening.   sulfamethoxazole-trimethoprim (BACTRIM DS) 800-160 MG tablet Take 1 tablet by mouth 2 (two) times daily.   tamsulosin (FLOMAX) 0.4 MG CAPS capsule  Take 1 capsule (0.4 mg total) by mouth daily.   testosterone cypionate (DEPOTESTOSTERONE CYPIONATE) 200 MG/ML injection INJECT 0.75 ML INTRAMUSCULARLY EVERY 10 DAYS   VASCEPA 1 g capsule TAKE 2 CAPSULES BY MOUTH TWICE DAILY   vitamin B-12 (CYANOCOBALAMIN) 1000 MCG tablet Take 1,000 mcg by mouth daily.   No facility-administered encounter medications on file as of 11/28/2023.    Allergies (verified) Clonidine derivatives and Clonidine   History: Past Medical History:  Diagnosis Date   Arthritis    Coronary artery disease    Depression    Hyperlipidemia    Skin cancer    left forearm treated years ago by Dr. Vonita Moss   Past Surgical History:  Procedure Laterality Date   ANKLE FRACTURE SURGERY Right    ARTHRODESIS ANT INTERBODY INC  DISCECTOMY, CERVICAL BELOW C2    05/2019   CARDIAC CATHETERIZATION N/A 04/26/2015   Procedure: Left Heart Cath;  Surgeon: Laurier Nancy, MD;  Location: [facility] INVASIVE CV LAB;  Service: Cardiovascular;  Laterality: N/A;   CLAVICLE SURGERY Right 05/04/2021   CORONARY ANGIOPLASTY     CORONARY STENT INTERVENTION N/A 03/27/2018   Procedure: CORONARY STENT INTERVENTION;  Surgeon: Alwyn Pea, MD;  Location: [facility] INVASIVE CV LAB;  Service: Cardiovascular;  Laterality: N/A;   EYE SURGERY Bilateral    cataract   FOOT SURGERY Left 08/22/2022   FOOT SURGERY Right 10/31/2022   LEFT HEART CATH AND CORONARY ANGIOGRAPHY Left 03/27/2018   Procedure: Left Heart Cath with possible coronary intervention;  Surgeon: Laurier Nancy, MD;  Location: [facility] INVASIVE CV LAB;  Service: Cardiovascular;  Laterality: Left;   LUMBAR FUSION  05/08/2022   TOTAL KNEE ARTHROPLASTY Right 10/08/2018   Procedure: TOTAL KNEE ARTHROPLASTY;  Surgeon: Lyndle Herrlich, MD;  Location: [facility] ORS;  Service: Orthopedics;  Laterality: Right;   Family History  Problem Relation Age of Onset   Diabetes Mother    Hypertension Mother    Cancer Father    Depression Father    Anxiety disorder Father    Depression Daughter    Social History   Socioeconomic History   Marital status: Married    Spouse name: ann   Number of children: 2   Years of education: Not on file   Highest education level: Master's degree (e.g., MA, MS, MEng, MEd, MSW, MBA)  Occupational History    Comment: retired  Tobacco Use   Smoking status: Former    Types: Cigars, Cigarettes    Quit date: 07/18/2006    Years since quitting: 17.3   Smokeless tobacco: Never  Vaping Use   Vaping status: Never Used  Substance and Sexual Activity   Alcohol use: No   Drug use: No   Sexual activity: Not Currently  Other Topics Concern   Not on file  Social History Narrative   Not on file   Social Drivers of Health   Financial Resource Strain: Low Risk   (11/13/2023)   Overall Financial Resource Strain (CARDIA)    Difficulty of Paying Living Expenses: Not hard at all  Food Insecurity: No Food Insecurity (11/28/2023)   Hunger Vital Sign    Worried About Running Out of Food in the Last Year: Never true    Ran Out of Food in the Last Year: Never true  Transportation Needs: No Transportation Needs (11/13/2023)   PRAPARE - Administrator, Civil Service (Medical): No    Lack of Transportation (Non-Medical): No  Physical Activity: Sufficiently Active (11/13/2023)  Exercise Vital Sign    Days of Exercise per Week: 5 days    Minutes of Exercise per Session: 150+ min  Stress: No Stress Concern Present (11/13/2023)   [employer] of Occupational Health - Occupational Stress Questionnaire    Feeling of Stress : Not at all  Social Connections: Socially Integrated (11/13/2023)   Social Connection and Isolation Panel [NHANES]    Frequency of Communication with Friends and Family: More than three times a week    Frequency of Social Gatherings with Friends and Family: More than three times a week    Attends Religious Services: More than 4 times per year    Active Member of [employer] or Organizations: Yes    Attends Engineer, structural: More than 4 times per year    Marital Status: Married    Tobacco Counseling Counseling given: Not Answered   Clinical Intake:  Pre-visit preparation completed: No  Pain : No/denies pain Pain Score: 0-No pain     BMI - recorded: 29.27 Nutritional Status: BMI 25 -29 Overweight Nutritional Risks: None Diabetes: No  How often do you need to have someone help you when you read instructions, pamphlets, or other written materials from your doctor or pharmacy?: 1 - Never  Interpreter Needed?: No      Activities of Daily Living    11/28/2023    9:48 AM  In your present state of health, do you have any difficulty performing the following activities:  Hearing? 0  Vision? 0  Difficulty  concentrating or making decisions? 0  Walking or climbing stairs? 1  Dressing or bathing? 0  Doing errands, shopping? 0  Preparing Food and eating ? N  Using the Toilet? N  In the past six months, have you accidently leaked urine? N  Do you have problems with loss of bowel control? N  Managing your Medications? N  Managing your Finances? N  Housekeeping or managing your Housekeeping? N    Patient Care Team: Dorcas Carrow, DO as PCP - General (Family Medicine) Willeen Niece, MD (Dermatology) Deeann Saint, MD (Specialist) Laurier Nancy, MD as Consulting Physician (Cardiology) Troxler, Blanchie Serve (Inactive) as Attending Physician (Podiatry) Jeralyn Ruths, MD as Consulting Physician (Oncology) Jeralyn Ruths, MD as Consulting Physician (Oncology)  Indicate any recent Medical Services you may have received from other than [facility] providers in the past year (date may be approximate).     Assessment:   This is a routine wellness examination for Kenneth Pruitt.  Hearing/Vision screen No results found.   Goals Addressed             This Visit's Progress    DIET - INCREASE WATER INTAKE   On track    Recommend drinking at least 6-8 glasses of water a day      Patient Stated   On track    Get health so can ride bike 500 miles next year      Depression Screen    11/28/2023    9:45 AM 11/21/2023    9:09 AM 11/13/2023   11:20 AM 07/01/2023   10:38 AM 05/29/2023    8:58 AM 05/07/2023   11:18 AM 12/17/2022   10:15 AM  PHQ 2/9 Scores  PHQ - 2 Score 2 2 2  2     PHQ- 9 Score 7 10 4  6        Information is confidential and restricted. Go to Review Flowsheets to unlock data.    Fall Risk  11/28/2023    9:45 AM 11/21/2023    9:09 AM 05/29/2023    8:57 AM 06/12/2022    9:36 AM 03/01/2022    8:21 AM  Fall Risk   Falls in the past year? 0  1 1 1   Number falls in past yr: 0 0 1 0 1  Injury with Fall? 0 0 0 0 1  Risk for fall due to : No Fall Risks No Fall Risks History of  fall(s)  History of fall(s)  Follow up Falls evaluation completed Falls evaluation completed Falls evaluation completed Falls evaluation completed;Education provided;Falls prevention discussed Falls evaluation completed    MEDICARE RISK AT HOME: Medicare Risk at Home Any stairs in or around the home?: Yes If so, are there any without handrails?: Yes Home free of loose throw rugs in walkways, pet beds, electrical cords, etc?: Yes Adequate lighting in your home to reduce risk of falls?: Yes Life alert?: No Use of a cane, walker or w/c?: No Grab bars in the bathroom?: Yes Shower chair or bench in shower?: No Elevated toilet seat or a handicapped toilet?: No  TIMED UP AND GO:  Was the test performed?  Yes  Length of time to ambulate 10 feet: 2 sec Gait steady and fast without use of assistive device    Cognitive Function:    11/28/2023    9:56 AM  MMSE - Mini Mental State Exam  Orientation to time 5  Orientation to Place 5  Registration 2  Attention/ Calculation 5  Language- name 2 objects 2  Language- repeat 1  Language- follow 3 step command 3  Language- read & follow direction 1  Write a sentence 1  Copy design 1        06/12/2022    9:37 AM 06/06/2021    5:29 PM 10/11/2017    8:31 AM  6CIT Screen  What Year? 0 points 0 points 0 points  What month? 0 points 0 points 0 points  What time? 0 points 0 points 0 points  Count back from 20 0 points 0 points 0 points  Months in reverse 0 points 0 points 0 points  Repeat phrase 0 points 0 points 0 points  Total Score 0 points 0 points 0 points    Immunizations Immunization History  Administered Date(s) Administered   Fluad Quad(high Dose 65+) 06/11/2022   Influenza, High Dose Seasonal PF 07/18/2017, 07/13/2019   Influenza,inj,Quad PF,6+ Mos 09/13/2015   Influenza-Unspecified 07/13/2016, 07/28/2018, 06/17/2021   Moderna Covid-19 Vaccine Bivalent Booster 64yrs & up 07/27/2022   Moderna Sars-Covid-2 Vaccination  10/22/2019, 11/20/2019, 07/10/2020, 06/23/2021   Pneumococcal Conjugate-13 10/11/2017   Pneumococcal Polysaccharide-23 11/26/2018   Tdap 07/30/2011, 04/22/2021   Zoster Recombinant(Shingrix) 02/24/2020   Zoster, Live 07/30/2011    TDAP status: Up to date  Flu Vaccine status: Declined, Education has been provided regarding the importance of this vaccine but patient still declined. Advised may receive this vaccine at local pharmacy or Health Dept. Aware to provide a copy of the vaccination record if obtained from local pharmacy or Health Dept. Verbalized acceptance and understanding.  Pneumococcal vaccine status: Up to date  Covid-19 vaccine status: Declined, Education has been provided regarding the importance of this vaccine but patient still declined. Advised may receive this vaccine at local pharmacy or Health Dept.or vaccine clinic. Aware to provide a copy of the vaccination record if obtained from local pharmacy or Health Dept. Verbalized acceptance and understanding.  Qualifies for Shingles Vaccine? No   Zostavax completed  Yes   Shingrix Completed?: Yes  Screening Tests Health Maintenance  Topic Date Due   COVID-19 Vaccine (6 - 2024-25 season) 05/19/2023   INFLUENZA VACCINE  12/16/2023 (Originally 04/18/2023)   Medicare Annual Wellness (AWV)  11/27/2024   Fecal DNA (Cologuard)  03/06/2026   DTaP/Tdap/Td (3 - Td or Tdap) 04/23/2031   Pneumonia Vaccine 99+ Years old  Completed   Hepatitis C Screening  Completed   Zoster Vaccines- Shingrix  Completed   HPV VACCINES  Aged Out   Colonoscopy  Discontinued    Health Maintenance  Health Maintenance Due  Topic Date Due   COVID-19 Vaccine (6 - 2024-25 season) 05/19/2023    Colorectal cancer screening: Type of screening: Cologuard. Completed 03/07/23. Repeat every 3 years  Lung Cancer Screening: (Low Dose CT Chest recommended if Age 18-80 years, 20 pack-year currently smoking OR have quit w/in 15years.) does not qualify.   Lung  Cancer Screening Referral: N/A  Additional Screening:  Hepatitis C Screening: does qualify; Completed 11/17/15  Vision Screening: Recommended annual ophthalmology exams for early detection of glaucoma and other disorders of the eye. Is the patient up to date with their annual eye exam?   N/A  Dental Screening: Recommended annual dental exams for proper oral hygiene  Community Resource Referral / Chronic Care Management: CRR required this visit?  No   CCM required this visit?  No     Plan:     I have personally reviewed and noted the following in the patient's chart:   Medical and social history Use of alcohol, tobacco or illicit drugs  Current medications and supplements including opioid prescriptions. Patient is not currently taking opioid prescriptions. Functional ability and status Nutritional status Physical activity Advanced directives List of other physicians Hospitalizations, surgeries, and ER visits in previous 12 months Vitals Screenings to include cognitive, depression, and falls Referrals and appointments  In addition, I have reviewed and discussed with patient certain preventive protocols, quality metrics, and best practice recommendations. A written personalized care plan for preventive services as well as general preventive health recommendations were provided to patient.     Olevia Perches, DO   11/28/2023   After Visit Summary: (In Person-Printed) AVS printed and given to the patient  Nurse Notes: N/A

## 2023-11-28 NOTE — Progress Notes (Unsigned)
 BP 134/69 (BP Location: Left Arm, Patient Position: Sitting, Cuff Size: Large)   Pulse 81   Temp 98.9 F (37.2 C) (Oral)   Resp 16   Ht 6' 0.99" (1.854 m)   Wt 221 lb 12.8 oz (100.6 kg)   SpO2 98%   BMI 29.27 kg/m    Subjective:    Patient ID: Kenneth Pruitt, male    DOB: 1950/12/29, 73 y.o.   MRN: [id number]  HPI: Kenneth Pruitt is a 73 y.o. male presenting on 11/28/2023 for comprehensive medical examination. Current medical complaints include:  HYPERTENSION / HYPERLIPIDEMIA Satisfied with current treatment? {Blank single:19197::"yes","no"} Duration of hypertension: {Blank single:19197::"chronic","months","years"} BP monitoring frequency: {Blank single:19197::"not checking","rarely","daily","weekly","monthly","a few times a day","a few times a week","a few times a month"} BP range:  BP medication side effects: {Blank single:19197::"yes","no"} Past BP meds: {Blank multiple:19196::"none","amlodipine","amlodipine/benazepril","atenolol","benazepril","benazepril/HCTZ","bisoprolol (bystolic)","carvedilol","chlorthalidone","clonidine","diltiazem","exforge HCT","HCTZ","irbesartan (avapro)","labetalol","lisinopril","lisinopril-HCTZ","losartan (cozaar)","methyldopa","nifedipine","olmesartan (benicar)","olmesartan-HCTZ","quinapril","ramipril","spironalactone","tekturna","valsartan","valsartan-HCTZ","verapamil"} Duration of hyperlipidemia: {Blank single:19197::"chronic","months","years"} Cholesterol medication side effects: {Blank single:19197::"yes","no"} Cholesterol supplements: {Blank multiple:19196::"none","fish oil","niacin","red yeast rice"} Past cholesterol medications: {Blank multiple:19196::"none","atorvastain (lipitor)","lovastatin (mevacor)","pravastatin (pravachol)","rosuvastatin (crestor)","simvastatin (zocor)","vytorin","fenofibrate (tricor)","gemfibrozil","ezetimide (zetia)","niaspan","lovaza"} Medication compliance: {Blank single:19197::"excellent compliance","good  compliance","fair compliance","poor compliance"} Aspirin: {Blank single:19197::"yes","no"} Recent stressors: {Blank single:19197::"yes","no"} Recurrent headaches: {Blank single:19197::"yes","no"} Visual changes: {Blank single:19197::"yes","no"} Palpitations: {Blank single:19197::"yes","no"} Dyspnea: {Blank single:19197::"yes","no"} Chest pain: {Blank single:19197::"yes","no"} Lower extremity edema: {Blank single:19197::"yes","no"} Dizzy/lightheaded: {Blank single:19197::"yes","no"}  DEPRESSION Mood status: {Blank single:19197::"controlled","uncontrolled","better","worse","exacerbated","stable"} Satisfied with current treatment?: {Blank single:19197::"yes","no"} Symptom severity: {Blank single:19197::"mild","moderate","severe"}  Duration of current treatment : {Blank single:19197::"chronic","months","years"} Side effects: {Blank single:19197::"yes","no"} Medication compliance: {Blank single:19197::"excellent compliance","good compliance","fair compliance","poor compliance"} Psychotherapy/counseling: {Blank single:19197::"yes","no"} {Blank single:19197::"current","in the past"} Previous psychiatric medications: {Blank multiple:19196::"abilify","amitryptiline","buspar","celexa","cymbalta","depakote","effexor","lamictal","lexapro","lithium","nortryptiline","paxil","prozac","pristiq (desvenlafaxine","seroquel","wellbutrin","zoloft","zyprexa"} Depressed mood: {Blank single:19197::"yes","no"} Anxious mood: {Blank single:19197::"yes","no"} Anhedonia: {Blank single:19197::"yes","no"} Significant weight loss or gain: {Blank single:19197::"yes","no"} Insomnia: {Blank single:19197::"yes","no"} {Blank single:19197::"hard to fall asleep","hard to stay asleep"} Fatigue: {Blank single:19197::"yes","no"} Feelings of worthlessness or guilt: {Blank single:19197::"yes","no"} Impaired concentration/indecisiveness: {Blank single:19197::"yes","no"} Suicidal ideations: {Blank  single:19197::"yes","no"} Hopelessness: {Blank single:19197::"yes","no"} Crying spells: {Blank single:19197::"yes","no"}    11/28/2023    9:45 AM 11/21/2023    9:09 AM 11/13/2023   11:20 AM 07/01/2023   10:38 AM 05/29/2023    8:58 AM  Depression screen PHQ 2/9  Decreased Interest 1 1 1  1   Down, Depressed, Hopeless 1 1 1  1   PHQ - 2 Score 2 2 2  2   Altered sleeping 0 1 0  0  Tired, decreased energy 1 2 1  3   Change in appetite 0 1 1  0  Feeling bad or failure about yourself  1 1 0  1  Trouble concentrating 1 1 0  0  Moving slowly or fidgety/restless 1 1 0  0  Suicidal thoughts 1 1 0  0  PHQ-9 Score 7 10 4  6   Difficult doing work/chores Not difficult at all  Not difficult at all  Somewhat difficult     Information is confidential and restricted. Go to Review Flowsheets to unlock data.   LOW TESTOSTERONE Duration: {Blank single:19197::"chronic","months","years"} Status: {Blank single:19197::"controlled","uncontrolled","better","worse","exacerbated","stable"}  Satisfied with current treatment:  {Blank single:19197::"yes","no"} Previous testosterone therapies: Medication side effects:  {Blank single:19197::"yes","no"} Medication compliance: {Blank single:19197::"excellent compliance","good compliance","fair compliance","poor compliance"} Decreased libido: {Blank single:19197::"yes","no"} Fatigue: {Blank single:19197::"yes","no"} Depressed mood: {Blank single:19197::"yes","no"} Muscle weakness: {Blank single:19197::"yes","no"} Erectile dysfunction: {Blank single:19197::"yes","no"}   Interim Problems from his last visit: no  Depression Screen done today and results listed below:     11/28/2023    9:45 AM 11/21/2023    9:09 AM 11/13/2023   11:20 AM 07/01/2023   10:38 AM 05/29/2023    8:58 AM  Depression screen PHQ 2/9  Decreased Interest 1 1 1  1   Down, Depressed, Hopeless 1 1 1  1   PHQ - 2 Score 2 2 2  2   Altered sleeping 0 1 0  0  Tired, decreased energy 1 2 1  3   Change in  appetite 0 1 1  0  Feeling bad or failure about yourself  1 1 0  1  Trouble concentrating 1 1 0  0  Moving slowly or fidgety/restless 1 1 0  0  Suicidal thoughts 1 1 0  0  PHQ-9 Score 7 10 4  6   Difficult doing work/chores Not difficult at all  Not difficult at all  Somewhat difficult     Information is confidential and restricted. Go to Review Flowsheets to unlock data.    Past Medical History:  Past Medical History:  Diagnosis Date   Arthritis    Coronary artery disease    Depression    Hyperlipidemia    Skin cancer    left forearm treated years ago by Dr. Vonita Moss    Surgical History:  Past Surgical History:  Procedure Laterality Date   ANKLE FRACTURE SURGERY Right    ARTHRODESIS ANT INTERBODY INC DISCECTOMY, CERVICAL BELOW C2    05/2019   CARDIAC CATHETERIZATION N/A 04/26/2015   Procedure: Left Heart Cath;  Surgeon: Laurier Nancy, MD;  Location: [facility] INVASIVE CV [facility];  Service: Cardiovascular;  Laterality: N/A;   CLAVICLE SURGERY Right 05/04/2021   CORONARY ANGIOPLASTY     CORONARY STENT INTERVENTION N/A 03/27/2018   Procedure: CORONARY STENT INTERVENTION;  Surgeon: Alwyn Pea, MD;  Location: [facility];  Service: Cardiovascular;  Laterality: N/A;   EYE SURGERY Bilateral    cataract   FOOT SURGERY Left 08/22/2022   FOOT SURGERY Right 10/31/2022   LEFT HEART CATH AND CORONARY ANGIOGRAPHY Left 03/27/2018   Procedure: Left Heart Cath with possible coronary intervention;  Surgeon: Laurier Nancy, MD;  Location: [facility];  Service: Cardiovascular;  Laterality: Left;   LUMBAR FUSION  05/08/2022   TOTAL KNEE ARTHROPLASTY Right 10/08/2018   Procedure: TOTAL KNEE ARTHROPLASTY;  Surgeon: Lyndle Herrlich, MD;  Location: [facility] OR[facility];  Service: Orthopedics;  Laterality: Right;    Medications:  Current Outpatient Medications on File Prior to Visit  Medication Sig   acetaminophen (TYLENOL) 500 MG tablet Take by mouth.   aspirin 81 MG EC tablet  Take 1 tablet (81 mg total) by mouth daily.   buPROPion (WELLBUTRIN XL) 150 MG 24 hr tablet Take 1 tablet (150 mg total) by mouth daily with breakfast.   chlorthalidone (HYGROTON) 25 MG tablet TAKE 1 TABLET BY MOUTH DAILY   Cholecalciferol 125 MCG (5000 UT) capsule Take 5,000 Units by mouth daily.   clopidogrel (PLAVIX) 75 MG tablet TAKE 1 TABLET BY MOUTH DAILY   clotrimazole-betamethasone (LOTRISONE) cream Apply 1 application topically 2 (two) times daily.   diclofenac Sodium (VOLTAREN) 1 % GEL    DULoxetine (CYMBALTA) 20 MG capsule Take 20 mg daily along with 60 mg - total of 80 mg .   DULoxetine (CYMBALTA) 60 MG capsule TAKE 1 CAPSULE BY MOUTH DAILY ALONG WITHTHE 20MG  FOR A TOTAL OF 80MG    dutasteride (AVODART) 0.5 MG capsule TAKE 1 CAPSULE BY MOUTH EVERY DAY IN THEEVENING   fluticasone (FLONASE) 50 MCG/ACT nasal spray Place 2 sprays into both nostrils daily.   hydrALAZINE (APRESOLINE) 50 MG tablet Take 1 tablet (50 mg total) by mouth 2 (  two) times daily.   hydrocortisone (ANUSOL-HC) 2.5 % rectal cream APPLY RECTALLY TWICE DAILY AS DIRECTED   hydrOXYzine (VISTARIL) 25 MG capsule Take 1 capsule (25 mg total) by mouth 2 (two) times daily as needed for anxiety.   losartan (COZAAR) 50 MG tablet TAKE 1 TABLET BY MOUTH ONCE DAILY   meloxicam (MOBIC) 15 MG tablet Take 1 tablet (15 mg total) by mouth daily.   mupirocin ointment (BACTROBAN) 2 % APPLY 1 APPLICATION TOPICALLY 2 TIMES DAILY   niacin (NIASPAN) 1000 MG CR tablet Take 2 tablets (2,000 mg total) by mouth at bedtime.   pantoprazole (PROTONIX) 20 MG tablet TAKE ONE TABLET BY MOUTH EVERY DAY   rosuvastatin (CRESTOR) 40 MG tablet Take 1 tablet (40 mg total) by mouth every evening.   sulfamethoxazole-trimethoprim (BACTRIM DS) 800-160 MG tablet Take 1 tablet by mouth 2 (two) times daily.   tamsulosin (FLOMAX) 0.4 MG CAPS capsule Take 1 capsule (0.4 mg total) by mouth daily.   testosterone cypionate (DEPOTESTOSTERONE CYPIONATE) 200 MG/ML injection  INJECT 0.75 ML INTRAMUSCULARLY EVERY 10 DAYS   VASCEPA 1 g capsule TAKE 2 CAPSULES BY MOUTH TWICE DAILY   vitamin B-12 (CYANOCOBALAMIN) 1000 MCG tablet Take 1,000 mcg by mouth daily.   No current facility-administered medications on file prior to visit.    Allergies:  Allergies  Allergen Reactions   Clonidine Derivatives Shortness Of Breath and Other (See Comments)    Low heart rate   Clonidine     Mild bradycardia + Somnolence    Social History:  Social History   Socioeconomic History   Marital status: Married    Spouse name: ann   Number of children: 2   Years of education: Not on file   Highest education level: Master's degree (e.g., MA, MS, MEng, MEd, MSW, MBA)  Occupational History    Comment: retired  Tobacco Use   Smoking status: Former    Types: Cigars, Cigarettes    Quit date: 07/18/2006    Years since quitting: 17.3   Smokeless tobacco: Never  Vaping Use   Vaping status: Never Used  Substance and Sexual Activity   Alcohol use: No   Drug use: No   Sexual activity: Not Currently  Other Topics Concern   Not on file  Social History Narrative   Not on file   Social Drivers of Health   Financial Resource Strain: Low Risk  (11/13/2023)   Overall Financial Resource Strain (CARDIA)    Difficulty of Paying Living Expenses: Not hard at all  Food Insecurity: No Food Insecurity (11/28/2023)   Hunger Vital Sign    Worried About Running Out of Food in the Last Year: Never true    Ran Out of Food in the Last Year: Never true  Transportation Needs: No Transportation Needs (11/13/2023)   PRAPARE - Administrator, Civil Service (Medical): No    Lack of Transportation (Non-Medical): No  Physical Activity: Sufficiently Active (11/13/2023)   Exercise Vital Sign    Days of Exercise per Week: 5 days    Minutes of Exercise per Session: 150+ min  Stress: No Stress Concern Present (11/13/2023)   [employer] of Occupational Health - Occupational Stress  Questionnaire    Feeling of Stress : Not at all  Social Connections: Socially Integrated (11/13/2023)   Social Connection and Isolation Panel [NHANES]    Frequency of Communication with Friends and Family: More than three times a week    Frequency of Social Gatherings with Friends and  Family: More than three times a week    Attends Religious Services: More than 4 times per year    Active Member of Clubs or Organizations: Yes    Attends Banker Meetings: More than 4 times per year    Marital Status: Married  Catering manager Violence: Not At Risk (06/12/2022)   Humiliation, Afraid, Rape, and Kick questionnaire    Fear of Current or Ex-Partner: No    Emotionally Abused: No    Physically Abused: No    Sexually Abused: No   Social History   Tobacco Use  Smoking Status Former   Types: Cigars, Cigarettes   Quit date: 07/18/2006   Years since quitting: 17.3  Smokeless Tobacco Never   Social History   Substance and Sexual Activity  Alcohol Use No    Family History:  Family History  Problem Relation Age of Onset   Diabetes Mother    Hypertension Mother    Cancer Father    Depression Father    Anxiety disorder Father    Depression Daughter     Past medical history, surgical history, medications, allergies, family history and social history reviewed with patient today and changes made to appropriate areas of the chart.   Review of Systems  Constitutional: Negative.   HENT: Negative.    Eyes: Negative.   Respiratory: Negative.    Cardiovascular: Negative.   Gastrointestinal:  Positive for constipation and heartburn. Negative for abdominal pain, blood in stool, diarrhea, melena, nausea and vomiting.  Genitourinary: Negative.   Musculoskeletal: Negative.   Skin: Negative.   Neurological: Negative.   Endo/Heme/Allergies:  Negative for environmental allergies and polydipsia. Bruises/bleeds easily.  Psychiatric/Behavioral:  Negative for depression, hallucinations,  memory loss, substance abuse and suicidal ideas. The patient is nervous/anxious. The patient does not have insomnia.    All other ROS negative except what is listed above and in the HPI.      Objective:    BP 134/69 (BP Location: Left Arm, Patient Position: Sitting, Cuff Size: Large)   Pulse 81   Temp 98.9 F (37.2 C) (Oral)   Resp 16   Ht 6' 0.99" (1.854 m)   Wt 221 lb 12.8 oz (100.6 kg)   SpO2 98%   BMI 29.27 kg/m   Wt Readings from Last 3 Encounters:  11/28/23 221 lb 12.8 oz (100.6 kg)  11/21/23 218 lb 12.8 oz (99.2 kg)  11/13/23 219 lb (99.3 kg)    Physical Exam  Results for orders placed or performed in visit on 09/05/23  CBC with Differential (Cancer Center Only)   Collection Time: 09/05/23  1:06 PM  Result Value Ref Range   WBC Count 5.0 4.0 - 10.5 K/uL   RBC 5.55 4.22 - 5.81 MIL/uL   Hemoglobin 16.0 13.0 - 17.0 g/dL   HCT 81.1 91.4 - 78.2 %   MCV 87.0 80.0 - 100.0 fL   MCH 28.8 26.0 - 34.0 pg   MCHC 33.1 30.0 - 36.0 g/dL   RDW 95.6 21.3 - 08.6 %   Platelet Count 209 150 - 400 K/uL   nRBC 0.0 0.0 - 0.2 %   Neutrophils Relative % 52 %   Neutro Abs 2.6 1.7 - 7.7 K/uL   Lymphocytes Relative 26 %   Lymphs Abs 1.3 0.7 - 4.0 K/uL   Monocytes Relative 15 %   Monocytes Absolute 0.8 0.1 - 1.0 K/uL   Eosinophils Relative 6 %   Eosinophils Absolute 0.3 0.0 - 0.5 K/uL   Basophils  Relative 1 %   Basophils Absolute 0.1 0.0 - 0.1 K/uL   Immature Granulocytes 0 %   Abs Immature Granulocytes 0.02 0.00 - 0.07 K/uL      Assessment & Plan:   Problem List Items Addressed This Visit   None Visit Diagnoses       Encounter for Medicare annual wellness exam    -  Primary        Discussed aspirin prophylaxis for myocardial infarction prevention and decision was made to continue ASA  LABORATORY TESTING:  Health maintenance labs ordered today as discussed above.   The natural history of prostate cancer and ongoing controversy regarding screening and potential treatment  outcomes of prostate cancer has been discussed with the patient. The meaning of a false positive PSA and a false negative PSA has been discussed. He indicates understanding of the limitations of this screening test and wishes to proceed with screening PSA testing.   IMMUNIZATIONS:   - Tdap: Tetanus vaccination status reviewed: last tetanus booster within 10 years. - Influenza: Refused - Pneumovax: Up to date - Prevnar: Up to date - COVID: Refused - HPV: Not applicable - Shingrix vaccine: Up to date  SCREENING: - Colonoscopy: Up to date  Discussed with patient purpose of the colonoscopy is to detect colon cancer at curable precancerous or early stages   PATIENT COUNSELING:    Sexuality: Discussed sexually transmitted diseases, partner selection, use of condoms, avoidance of unintended pregnancy  and contraceptive alternatives.   Advised to avoid cigarette smoking.  I discussed with the patient that most people either abstain from alcohol or drink within safe limits (<=14/week and <=4 drinks/occasion for males, <=7/weeks and <= 3 drinks/occasion for females) and that the risk for alcohol disorders and other health effects rises proportionally with the number of drinks per week and how often a drinker exceeds daily limits.  Discussed cessation/primary prevention of drug use and availability of treatment for abuse.   Diet: Encouraged to adjust caloric intake to maintain  or achieve ideal body weight, to reduce intake of dietary saturated fat and total fat, to limit sodium intake by avoiding high sodium foods and not adding table salt, and to maintain adequate dietary potassium and calcium preferably from fresh fruits, vegetables, and low-fat dairy products.    stressed the importance of regular exercise  Injury prevention: Discussed safety belts, safety helmets, smoke detector, smoking near bedding or upholstery.   Dental health: Discussed importance of regular tooth brushing, flossing,  and dental visits.   Follow up plan: NEXT PREVENTATIVE PHYSICAL DUE IN 1 YEAR. Return in 1 year (on 11/27/2024).

## 2023-11-28 NOTE — Progress Notes (Unsigned)
 Hearing Screen Results  20 dB HL   Right Ear Left Ear  500 Hz Pass [x]  Fail []  Pass [x]  Fail []   1,000 Hz Pass [x]  Fail []  Pass [x]  Fail []   2,000 Hz Pass [x]  Fail []  Pass [x]  Fail []   4,000 Hz Pass [x]  Fail []  Pass [x]  Fail []     25 dB HL   Right Ear Left Ear  500 Hz Pass [x]  Fail []  Pass [x]  Fail []   1,000 Hz Pass [x]  Fail []  Pass [x]  Fail []   2,000 Hz Pass [x]  Fail []  Pass [x]  Fail []   4,000 Hz Pass [x]  Fail []  Pass [x]  Fail []     40 dB HL   Right Ear Left Ear  500 Hz Pass [x]  Fail []  Pass [x]  Fail []   1,000 Hz Pass [x]  Fail []  Pass [x]  Fail []   2,000 Hz Pass [x]  Fail []  Pass [x]  Fail []   4,000 Hz Pass [x]  Fail []  Pass [x]  Fail []    Comments:

## 2023-11-29 ENCOUNTER — Encounter: Payer: Self-pay | Admitting: Family Medicine

## 2023-11-29 ENCOUNTER — Other Ambulatory Visit

## 2023-11-29 DIAGNOSIS — E782 Mixed hyperlipidemia: Secondary | ICD-10-CM

## 2023-11-29 DIAGNOSIS — E291 Testicular hypofunction: Secondary | ICD-10-CM

## 2023-11-29 DIAGNOSIS — D751 Secondary polycythemia: Secondary | ICD-10-CM

## 2023-11-29 DIAGNOSIS — I10 Essential (primary) hypertension: Secondary | ICD-10-CM

## 2023-11-29 DIAGNOSIS — F3342 Major depressive disorder, recurrent, in full remission: Secondary | ICD-10-CM

## 2023-11-29 DIAGNOSIS — R972 Elevated prostate specific antigen [PSA]: Secondary | ICD-10-CM

## 2023-11-29 LAB — MICROALBUMIN, URINE WAIVED
Creatinine, Urine Waived: 100 mg/dL (ref 10–300)
Microalb, Ur Waived: 30 mg/L — ABNORMAL HIGH (ref 0–19)
Microalb/Creat Ratio: <30 mg/g (ref <30)

## 2023-11-29 NOTE — Assessment & Plan Note (Signed)
 Rechecking labs today. Await results. Treat as needed.

## 2023-11-29 NOTE — Assessment & Plan Note (Signed)
 Reassured patient. Continue to monitor.

## 2023-11-29 NOTE — Assessment & Plan Note (Signed)
Stable. Continue to follow with psychiatry. Call with any concerns. Continue to monitor.  

## 2023-11-29 NOTE — Assessment & Plan Note (Signed)
 Under good control on current regimen. Continue current regimen. Continue to monitor. Call with any concerns. Refills given. Labs drawn today.

## 2023-11-29 NOTE — Assessment & Plan Note (Signed)
 Rechecking labs today. Await results. Treat as needed. Follow up with urology as scheduled.

## 2023-11-29 NOTE — Progress Notes (Signed)
 Appt has been made.

## 2023-12-06 ENCOUNTER — Other Ambulatory Visit: Payer: Self-pay | Admitting: Family Medicine

## 2023-12-06 ENCOUNTER — Encounter: Payer: Self-pay | Admitting: Family Medicine

## 2023-12-06 DIAGNOSIS — N289 Disorder of kidney and ureter, unspecified: Secondary | ICD-10-CM

## 2023-12-06 DIAGNOSIS — R972 Elevated prostate specific antigen [PSA]: Secondary | ICD-10-CM

## 2023-12-06 LAB — CBC WITH DIFFERENTIAL/PLATELET
Basophils Absolute: 0 10*3/uL (ref 0.0–0.2)
Basos: 0 %
EOS (ABSOLUTE): 0.2 10*3/uL (ref 0.0–0.4)
Eos: 3 %
Hematocrit: 48.3 % (ref 37.5–51.0)
Hemoglobin: 16.1 g/dL (ref 13.0–17.7)
Immature Grans (Abs): 0 10*3/uL (ref 0.0–0.1)
Immature Granulocytes: 0 %
Lymphocytes Absolute: 1 10*3/uL (ref 0.7–3.1)
Lymphs: 16 %
MCH: 30 pg (ref 26.6–33.0)
MCHC: 33.3 g/dL (ref 31.5–35.7)
MCV: 90 fL (ref 79–97)
Monocytes Absolute: 0.6 10*3/uL (ref 0.1–0.9)
Monocytes: 9 %
Neutrophils Absolute: 4.3 10*3/uL (ref 1.4–7.0)
Neutrophils: 72 %
Platelets: 235 10*3/uL (ref 150–450)
RBC: 5.37 x10E6/uL (ref 4.14–5.80)
RDW: 15.6 % — ABNORMAL HIGH (ref 11.6–15.4)
WBC: 6 10*3/uL (ref 3.4–10.8)

## 2023-12-06 LAB — TSH: TSH: 1.32 u[IU]/mL (ref 0.450–4.500)

## 2023-12-06 LAB — COMPREHENSIVE METABOLIC PANEL
ALT: 21 IU/L (ref 0–44)
AST: 24 IU/L (ref 0–40)
Albumin: 4.3 g/dL (ref 3.8–4.8)
Alkaline Phosphatase: 56 IU/L (ref 44–121)
BUN/Creatinine Ratio: 12 (ref 10–24)
BUN: 18 mg/dL (ref 8–27)
Bilirubin Total: 0.4 mg/dL (ref 0.0–1.2)
CO2: 24 mmol/L (ref 20–29)
Calcium: 9.3 mg/dL (ref 8.6–10.2)
Chloride: 97 mmol/L (ref 96–106)
Creatinine, Ser: 1.52 mg/dL — ABNORMAL HIGH (ref 0.76–1.27)
Globulin, Total: 2.1 g/dL (ref 1.5–4.5)
Glucose: 103 mg/dL — ABNORMAL HIGH (ref 70–99)
Potassium: 4 mmol/L (ref 3.5–5.2)
Sodium: 135 mmol/L (ref 134–144)
Total Protein: 6.4 g/dL (ref 6.0–8.5)
eGFR: 48 mL/min/{1.73_m2} — ABNORMAL LOW (ref >59)

## 2023-12-06 LAB — PSA: Prostate Specific Ag, Serum: 4.3 ng/mL — ABNORMAL HIGH (ref 0.0–4.0)

## 2023-12-06 LAB — TESTOSTERONE, FREE, TOTAL, SHBG
Sex Hormone Binding: 22.9 nmol/L (ref 19.3–76.4)
Testosterone, Free: 8.7 pg/mL (ref 6.6–18.1)
Testosterone: 529 ng/dL (ref 264–916)

## 2023-12-06 LAB — LIPID PANEL W/O CHOL/HDL RATIO
Cholesterol, Total: 87 mg/dL — ABNORMAL LOW (ref 100–199)
HDL: 37 mg/dL — ABNORMAL LOW (ref >39)
LDL Chol Calc (NIH): 27 mg/dL (ref 0–99)
Triglycerides: 130 mg/dL (ref 0–149)
VLDL Cholesterol Cal: 23 mg/dL (ref 5–40)

## 2023-12-09 ENCOUNTER — Encounter: Payer: Self-pay | Admitting: Cardiovascular Disease

## 2023-12-09 ENCOUNTER — Ambulatory Visit: Payer: Medicare PPO | Admitting: Cardiovascular Disease

## 2023-12-09 VITALS — BP 110/70 | HR 90 | Ht 73.0 in | Wt 215.0 lb

## 2023-12-09 DIAGNOSIS — E782 Mixed hyperlipidemia: Secondary | ICD-10-CM | POA: Diagnosis not present

## 2023-12-09 DIAGNOSIS — I951 Orthostatic hypotension: Secondary | ICD-10-CM

## 2023-12-09 DIAGNOSIS — I25110 Atherosclerotic heart disease of native coronary artery with unstable angina pectoris: Secondary | ICD-10-CM | POA: Diagnosis not present

## 2023-12-09 DIAGNOSIS — G4733 Obstructive sleep apnea (adult) (pediatric): Secondary | ICD-10-CM | POA: Diagnosis not present

## 2023-12-09 DIAGNOSIS — I2510 Atherosclerotic heart disease of native coronary artery without angina pectoris: Secondary | ICD-10-CM

## 2023-12-09 NOTE — Progress Notes (Signed)
 Cardiology Office Note   Date:  12/09/2023   ID:  Kenneth Pruitt, Kenneth Pruitt 1950/11/03, MRN [id number]  PCP:  Dorcas Carrow, DO  Cardiologist:  Adrian Blackwater, MD      History of Present Illness: Kenneth Pruitt is a 73 y.o. male who presents for  Chief Complaint  Patient presents with   Follow-up    4 month follow up     Doing well, no chest pain or SOB.      Past Medical History:  Diagnosis Date   Arthritis    Coronary artery disease    Depression    Hyperlipidemia    Skin cancer    left forearm treated years ago by Dr. Vonita Moss     Past Surgical History:  Procedure Laterality Date   ANKLE FRACTURE SURGERY Right    ARTHRODESIS ANT INTERBODY INC DISCECTOMY, CERVICAL BELOW C2    05/2019   CARDIAC CATHETERIZATION N/A 04/26/2015   Procedure: Left Heart Cath;  Surgeon: Laurier Nancy, MD;  Location: [facility] INVASIVE CV LAB;  Service: Cardiovascular;  Laterality: N/A;   CLAVICLE SURGERY Right 05/04/2021   CORONARY ANGIOPLASTY     CORONARY STENT INTERVENTION N/A 03/27/2018   Procedure: CORONARY STENT INTERVENTION;  Surgeon: Alwyn Pea, MD;  Location: [facility] INVASIVE CV [facility];  Service: Cardiovascular;  Laterality: N/A;   EYE SURGERY Bilateral    cataract   FOOT SURGERY Left 08/22/2022   FOOT SURGERY Right 10/31/2022   LEFT HEART CATH AND CORONARY ANGIOGRAPHY Left 03/27/2018   Procedure: Left Heart Cath with possible coronary intervention;  Surgeon: Laurier Nancy, MD;  Location: [facility] INVASIVE CV LAB;  Service: Cardiovascular;  Laterality: Left;   LUMBAR FUSION  05/08/2022   TOTAL KNEE ARTHROPLASTY Right 10/08/2018   Procedure: TOTAL KNEE ARTHROPLASTY;  Surgeon: Lyndle Herrlich, MD;  Location: [facility] ORS;  Service: Orthopedics;  Laterality: Right;     Current Outpatient Medications  Medication Sig Dispense Refill   acetaminophen (TYLENOL) 500 MG tablet Take by mouth.     aspirin 81 MG EC tablet Take 1 tablet (81 mg total) by mouth daily. 90 tablet 3    buPROPion (WELLBUTRIN XL) 150 MG 24 hr tablet Take 1 tablet (150 mg total) by mouth daily with breakfast. 90 tablet 3   chlorthalidone (HYGROTON) 25 MG tablet Take 1 tablet (25 mg total) by mouth daily. 90 tablet 1   Cholecalciferol 125 MCG (5000 UT) capsule Take 5,000 Units by mouth daily.     clopidogrel (PLAVIX) 75 MG tablet Take 1 tablet (75 mg total) by mouth daily. 90 tablet 1   clotrimazole-betamethasone (LOTRISONE) cream Apply 1 application topically 2 (two) times daily. 30 g 0   diclofenac Sodium (VOLTAREN) 1 % GEL      DULoxetine (CYMBALTA) 20 MG capsule Take 20 mg daily along with 60 mg - total of 80 mg . 90 capsule 3   DULoxetine (CYMBALTA) 60 MG capsule TAKE 1 CAPSULE BY MOUTH DAILY ALONG WITHTHE 20MG  FOR A TOTAL OF 80MG  90 capsule 3   dutasteride (AVODART) 0.5 MG capsule TAKE 1 CAPSULE BY MOUTH EVERY DAY IN THEEVENING 90 capsule 1   fluticasone (FLONASE) 50 MCG/ACT nasal spray Place 2 sprays into both nostrils daily. 16 g 12   hydrALAZINE (APRESOLINE) 50 MG tablet Take 1 tablet (50 mg total) by mouth 2 (two) times daily. 180 tablet 1   hydrocortisone (ANUSOL-HC) 2.5 % rectal cream APPLY RECTALLY TWICE DAILY AS DIRECTED 30 g 0  hydrOXYzine (VISTARIL) 25 MG capsule Take 1 capsule (25 mg total) by mouth 2 (two) times daily as needed for anxiety. 60 capsule 1   icosapent Ethyl (VASCEPA) 1 g capsule Take 2 capsules (2 g total) by mouth 2 (two) times daily. 360 capsule 1   losartan (COZAAR) 50 MG tablet Take 1 tablet (50 mg total) by mouth daily. 90 tablet 1   meloxicam (MOBIC) 15 MG tablet Take 1 tablet (15 mg total) by mouth daily. 90 tablet 1   mupirocin ointment (BACTROBAN) 2 % APPLY 1 APPLICATION TOPICALLY 2 TIMES DAILY 22 g 0   niacin (NIASPAN) 1000 MG CR tablet Take 2 tablets (2,000 mg total) by mouth at bedtime. 180 tablet 2   pantoprazole (PROTONIX) 20 MG tablet Take 1 tablet (20 mg total) by mouth daily. 90 tablet 1   rosuvastatin (CRESTOR) 40 MG tablet Take 1 tablet (40 mg  total) by mouth every evening. 90 tablet 1   sulfamethoxazole-trimethoprim (BACTRIM DS) 800-160 MG tablet Take 1 tablet by mouth 2 (two) times daily. 14 tablet 0   tamsulosin (FLOMAX) 0.4 MG CAPS capsule Take 1 capsule (0.4 mg total) by mouth daily. 90 capsule 1   testosterone cypionate (DEPOTESTOSTERONE CYPIONATE) 200 MG/ML injection INJECT 0.75 ML INTRAMUSCULARLY EVERY 10 DAYS 10 mL 0   vitamin B-12 (CYANOCOBALAMIN) 1000 MCG tablet Take 1,000 mcg by mouth daily.     No current facility-administered medications for this visit.    Allergies:   Clonidine derivatives and Clonidine    Social History:   reports that he quit smoking about 17 years ago. His smoking use included cigars and cigarettes. He has never used smokeless tobacco. He reports that he does not drink alcohol and does not use drugs.   Family History:  family history includes Anxiety disorder in his father; Cancer in his father; Depression in his daughter and father; Diabetes in his mother; Hypertension in his mother.    ROS:     Review of Systems  Constitutional: Negative.   HENT: Negative.    Eyes: Negative.   Respiratory: Negative.    Gastrointestinal: Negative.   Genitourinary: Negative.   Musculoskeletal: Negative.   Skin: Negative.   Neurological: Negative.   Endo/Heme/Allergies: Negative.   Psychiatric/Behavioral: Negative.    All other systems reviewed and are negative.     All other systems are reviewed and negative.    PHYSICAL EXAM: VS:  BP 110/70   Pulse 90   Ht 6\' 1"  (1.854 m)   Wt 215 lb (97.5 kg)   SpO2 99%   BMI 28.37 kg/m  , BMI Body mass index is 28.37 kg/m. Last weight:  Wt Readings from Last 3 Encounters:  12/09/23 215 lb (97.5 kg)  11/28/23 221 lb 12.8 oz (100.6 kg)  11/21/23 218 lb 12.8 oz (99.2 kg)     Physical Exam Vitals reviewed.  Constitutional:      Appearance: Normal appearance. He is normal weight.  HENT:     Head: Normocephalic.     Nose: Nose normal.      Mouth/Throat:     Mouth: Mucous membranes are moist.  Eyes:     Pupils: Pupils are equal, round, and reactive to light.  Cardiovascular:     Rate and Rhythm: Normal rate and regular rhythm.     Pulses: Normal pulses.     Heart sounds: Normal heart sounds.  Pulmonary:     Effort: Pulmonary effort is normal.  Abdominal:     General: Abdomen is  flat. Bowel sounds are normal.  Musculoskeletal:        General: Normal range of motion.     Cervical back: Normal range of motion.  Skin:    General: Skin is warm.  Neurological:     General: No focal deficit present.     Mental Status: He is alert.  Psychiatric:        Mood and Affect: Mood normal.       EKG:   Recent Labs: 11/29/2023: ALT 21; BUN 18; Creatinine, Ser 1.52; Hemoglobin 16.1; Platelets 235; Potassium 4.0; Sodium 135; TSH 1.320    Lipid Panel    Component Value Date/Time   CHOL 87 (L) 11/29/2023 0930   CHOL 115 08/06/2016 0954   TRIG 130 11/29/2023 0930   TRIG 188 (H) 08/06/2016 0954   HDL 37 (L) 11/29/2023 0930   CHOLHDL 2.5 08/20/2019 0836   CHOLHDL 3.3 03/28/2018 0854   VLDL 37 03/28/2018 0854   VLDL 38 (H) 08/06/2016 0954   LDLCALC 27 11/29/2023 0930      Other studies Reviewed: Additional studies/ records that were reviewed today include:  Review of the above records demonstrates:       No data to display            ASSESSMENT AND PLAN:    ICD-10-CM   1. Coronary artery disease involving native coronary artery of native heart without angina pectoris  I25.10    Had PCI/stenting diagnal.    2. Orthostasis  I95.1     3. OSA on CPAP  G47.33     4. Mixed hyperlipidemia  E78.2    BP normal    5. Coronary artery disease with unstable angina pectoris, unspecified vessel or lesion type, unspecified whether native or transplanted heart (HCC)  I25.110        Problem List Items Addressed This Visit       Cardiovascular and Mediastinum   CAD (coronary artery disease) - Primary   Orthostasis      Respiratory   OSA on CPAP     Other   Hyperlipidemia       Disposition:   No follow-ups on file.    Total time spent: 30 minutes  Signed,  Adrian Blackwater, MD  12/09/2023 10:02 AM    [facility]

## 2023-12-09 NOTE — Progress Notes (Signed)
 Lab appt scheduled.

## 2023-12-10 ENCOUNTER — Other Ambulatory Visit: Payer: Self-pay | Admitting: *Deleted

## 2023-12-10 DIAGNOSIS — E291 Testicular hypofunction: Secondary | ICD-10-CM

## 2023-12-11 ENCOUNTER — Other Ambulatory Visit: Payer: Medicare PPO

## 2023-12-11 ENCOUNTER — Encounter: Payer: Self-pay | Admitting: Urology

## 2023-12-12 ENCOUNTER — Other Ambulatory Visit

## 2023-12-12 DIAGNOSIS — E291 Testicular hypofunction: Secondary | ICD-10-CM

## 2023-12-13 ENCOUNTER — Encounter: Payer: Self-pay | Admitting: Urology

## 2023-12-13 LAB — HEMOGLOBIN AND HEMATOCRIT, BLOOD
Hematocrit: 48 % (ref 37.5–51.0)
Hemoglobin: 16.1 g/dL (ref 13.0–17.7)

## 2023-12-13 LAB — TESTOSTERONE: Testosterone: 651 ng/dL (ref 264–916)

## 2023-12-30 ENCOUNTER — Other Ambulatory Visit: Payer: Self-pay

## 2023-12-30 ENCOUNTER — Ambulatory Visit (INDEPENDENT_AMBULATORY_CARE_PROVIDER_SITE_OTHER): Payer: Self-pay | Admitting: Psychiatry

## 2023-12-30 ENCOUNTER — Encounter: Payer: Self-pay | Admitting: Psychiatry

## 2023-12-30 VITALS — BP 135/71 | HR 85 | Temp 97.4°F | Ht 73.0 in | Wt 215.0 lb

## 2023-12-30 DIAGNOSIS — F331 Major depressive disorder, recurrent, moderate: Secondary | ICD-10-CM

## 2023-12-30 DIAGNOSIS — F411 Generalized anxiety disorder: Secondary | ICD-10-CM | POA: Diagnosis not present

## 2023-12-30 DIAGNOSIS — G4701 Insomnia due to medical condition: Secondary | ICD-10-CM | POA: Diagnosis not present

## 2023-12-30 MED ORDER — BUPROPION HCL ER (XL) 300 MG PO TB24: 300.0000 mg | ORAL_TABLET | Freq: Every morning | ORAL | 1 refills | Status: DC

## 2023-12-30 NOTE — Patient Instructions (Signed)
  [email or web address]  [email or web address]  [email or web address] [name] [address], [place], [place] [address]   [phone]  [facility] [email or web address] [address], [place], [place] [address]  [phone]  [facility] [email or web address] [address], [place], [place] [address]  [phone]   [facility] - [phone]  [facility] - [phone]  [facility] - [phone] counseling [phone]  [facility] [phone]   [email or web address] this clinician can offer telehealth and has a sliding scale option  [email or web address] this group also offers sliding scale rates and is based out of [place]  Dr. Liborio Nixon with the [facility] specializes in divorce  [employer] [facility] has interns who offer sliding scale rates and some of the full time clinicians do, as well. You complete their contact form on their website and the referrals coordinator will help to get connected to someone   [email or web address] [phone]  Medicaid below :  [facility] [address] [place], [place] [address]  [phone]    [employer] [address] [place], [place] [address]  [phone]    [facility] [address] [place], [place] [address]  [phone]

## 2023-12-30 NOTE — Progress Notes (Signed)
 [facility] MD OP Progress Note  12/30/2023 10:16 AM Kenneth Pruitt  MRN:  [id number]  Chief Complaint:  Chief Complaint  Patient presents with   Follow-up   Depression   Anxiety   Medication Refill   Discussed the use of AI scribe software for clinical note transcription with the patient, who gave verbal consent to proceed.  History of Present Illness Kenneth Pruitt "Kenneth Pruitt" is a 73 year old Caucasian male, retired, married, lives in [name], has a history of MDD, GAD, insomnia, coronary artery disease status post stent placement, knee pain, multiple surgeries, testosterone deficiency  presents with concerns about his mental health and medication management.  He experiences persistent feelings of depression and lack of motivation, particularly upon waking. He sleeps excessively, including long afternoon naps, which he attributes to a need to escape. No side effects from Wellbutrin, such as irritability, decreased appetite, or heart palpitations. He wants to increase his Wellbutrin dosage to help with energy and motivation. He has a lifelong history of depression , with a family history of similar issues. His sister experiences similar feelings, and his father was depressed and anxious after serving in World War II. No active thoughts of self-harm but acknowledges feelings of being 'useless' and questions his purpose. He is currently on Wellbutrin 150 mg and Cymbalta 80 mg.  He has been experiencing sores in his mouth since Christmas, which have not resolved. He was referred to an oral surgeon who suspects an autoimmune disease. He was prescribed a steroidal mouthwash, which he used for two weeks and then stopped a week ago. He is scheduled for a biopsy on April 28 to confirm the diagnosis.  He uses a CPAP machine for sleep apnea, which he has been using for about six years. He recently acquired a new machine but had issues with the supplier, [employer], and plans to switch to a new company in  May.  His wife recently had a cancerous spot removed from her stomach via endoscopy at [facility], which has been a source of stress for him. She is currently emotional and undergoing follow-up care. He tries to remain calm to support her.  He is considering returning to school to pursue a Doctor of Musical Arts degree, a lifelong dream, but is uncertain due to current life stressors. He has been playing the oboe since eighth grade and recently resumed after a nerve problem in 2020 that prevented him from playing for two years.   Visit Diagnosis:    ICD-10-CM   1. MDD (major depressive disorder), recurrent episode, moderate (HCC)  F33.1 buPROPion (WELLBUTRIN XL) 300 MG 24 hr tablet    2. GAD (generalized anxiety disorder)  F41.1     3. Insomnia due to medical condition  G47.01    depression, obstructive sleep apnea on CPAP      Past Psychiatric History: I have reviewed past psychiatric history from progress note on 12/16/2017.  Past Medical History:  Past Medical History:  Diagnosis Date   Arthritis    Coronary artery disease    Depression    Hyperlipidemia    Skin cancer    left forearm treated years ago by Dr. Vonita Moss    Past Surgical History:  Procedure Laterality Date   ANKLE FRACTURE SURGERY Right    ARTHRODESIS ANT INTERBODY INC DISCECTOMY, CERVICAL BELOW C2    05/2019   CARDIAC CATHETERIZATION N/A 04/26/2015   Procedure: Left Heart Cath;  Surgeon: Laurier Nancy, MD;  Location: [facility] INVASIVE CV LAB;  Service: Cardiovascular;  Laterality: N/A;   CLAVICLE SURGERY Right 05/04/2021   CORONARY ANGIOPLASTY     CORONARY STENT INTERVENTION N/A 03/27/2018   Procedure: CORONARY STENT INTERVENTION;  Surgeon: Alwyn Pea, MD;  Location: [facility] INVASIVE CV LAB;  Service: Cardiovascular;  Laterality: N/A;   EYE SURGERY Bilateral    cataract   FOOT SURGERY Left 08/22/2022   FOOT SURGERY Right 10/31/2022   LEFT HEART CATH AND CORONARY ANGIOGRAPHY Left 03/27/2018   Procedure:  Left Heart Cath with possible coronary intervention;  Surgeon: Laurier Nancy, MD;  Location: [facility] INVASIVE CV LAB;  Service: Cardiovascular;  Laterality: Left;   LUMBAR FUSION  05/08/2022   TOTAL KNEE ARTHROPLASTY Right 10/08/2018   Procedure: TOTAL KNEE ARTHROPLASTY;  Surgeon: Lyndle Herrlich, MD;  Location: [facility] ORS;  Service: Orthopedics;  Laterality: Right;    Family Psychiatric History: I have reviewed family psychiatric history from progress note on 12/16/2017.  Family History:  Family History  Problem Relation Age of Onset   Diabetes Mother    Hypertension Mother    Cancer Father    Depression Father    Anxiety disorder Father    Depression Daughter     Social History: I have reviewed social history from my progress note on 12/16/2017. Social History   Socioeconomic History   Marital status: Married    Spouse name: ann   Number of children: 2   Years of education: Not on file   Highest education level: Master's degree (e.g., MA, MS, MEng, MEd, MSW, MBA)  Occupational History    Comment: retired  Tobacco Use   Smoking status: Former    Types: Cigars, Cigarettes    Quit date: 07/18/2006    Years since quitting: 17.4   Smokeless tobacco: Never  Vaping Use   Vaping status: Never Used  Substance and Sexual Activity   Alcohol use: No   Drug use: No   Sexual activity: Not Currently  Other Topics Concern   Not on file  Social History Narrative   Not on file   Social Drivers of Health   Financial Resource Strain: Low Risk  (11/28/2023)   Overall Financial Resource Strain (CARDIA)    Difficulty of Paying Living Expenses: Not hard at all  Food Insecurity: No Food Insecurity (11/28/2023)   Hunger Vital Sign    Worried About Running Out of Food in the Last Year: Never true    Ran Out of Food in the Last Year: Never true  Transportation Needs: No Transportation Needs (11/28/2023)   PRAPARE - Administrator, Civil Service (Medical): No    Lack of Transportation  (Non-Medical): No  Physical Activity: Insufficiently Active (11/28/2023)   Exercise Vital Sign    Days of Exercise per Week: 3 days    Minutes of Exercise per Session: 30 min  Stress: No Stress Concern Present (11/28/2023)   [employer] of Occupational Health - Occupational Stress Questionnaire    Feeling of Stress : Not at all  Social Connections: Socially Integrated (11/28/2023)   Social Connection and Isolation Panel [NHANES]    Frequency of Communication with Friends and Family: Twice a week    Frequency of Social Gatherings with Friends and Family: Once a week    Attends Religious Services: More than 4 times per year    Active Member of [employer] or Organizations: Yes    Attends Engineer, structural: More than 4 times per year    Marital Status: Married  Allergies:  Allergies  Allergen Reactions   Clonidine Derivatives Shortness Of Breath and Other (See Comments)    Low heart rate   Clonidine     Mild bradycardia + Somnolence    Metabolic Disorder Labs: Lab Results  Component Value Date   HGBA1C 6.1 (H) 05/01/2022   No results found for: "PROLACTIN" Lab Results  Component Value Date   CHOL 87 (L) 11/29/2023   TRIG 130 11/29/2023   HDL 37 (L) 11/29/2023   CHOLHDL 2.5 08/20/2019   VLDL 37 03/28/2018   LDLCALC 27 11/29/2023   LDLCALC 27 05/29/2023   Lab Results  Component Value Date   TSH 1.320 11/29/2023   TSH 1.120 11/26/2022    Therapeutic Level Labs: No results found for: "LITHIUM" No results found for: "VALPROATE" No results found for: "CBMZ"  Current Medications: Current Outpatient Medications  Medication Sig Dispense Refill   acetaminophen (TYLENOL) 500 MG tablet Take by mouth.     aspirin 81 MG EC tablet Take 1 tablet (81 mg total) by mouth daily. 90 tablet 3   buPROPion (WELLBUTRIN XL) 300 MG 24 hr tablet Take 1 tablet (300 mg total) by mouth in the morning. Stop the 150 mg , dose increase 90 tablet 1   chlorthalidone (HYGROTON) 25 MG  tablet Take 1 tablet (25 mg total) by mouth daily. 90 tablet 1   Cholecalciferol 125 MCG (5000 UT) capsule Take 5,000 Units by mouth daily.     clopidogrel (PLAVIX) 75 MG tablet Take 1 tablet (75 mg total) by mouth daily. 90 tablet 1   clotrimazole-betamethasone (LOTRISONE) cream Apply 1 application topically 2 (two) times daily. 30 g 0   dexamethasone (DECADRON) 0.5 MG/5ML solution      diclofenac Sodium (VOLTAREN) 1 % GEL      DULoxetine (CYMBALTA) 20 MG capsule Take 20 mg daily along with 60 mg - total of 80 mg . 90 capsule 3   DULoxetine (CYMBALTA) 60 MG capsule TAKE 1 CAPSULE BY MOUTH DAILY ALONG WITHTHE 20MG  FOR A TOTAL OF 80MG  90 capsule 3   dutasteride (AVODART) 0.5 MG capsule TAKE 1 CAPSULE BY MOUTH EVERY DAY IN THEEVENING 90 capsule 1   fluticasone (FLONASE) 50 MCG/ACT nasal spray Place 2 sprays into both nostrils daily. 16 g 12   hydrALAZINE (APRESOLINE) 50 MG tablet Take 1 tablet (50 mg total) by mouth 2 (two) times daily. 180 tablet 1   hydrocortisone (ANUSOL-HC) 2.5 % rectal cream APPLY RECTALLY TWICE DAILY AS DIRECTED 30 g 0   hydrOXYzine (VISTARIL) 25 MG capsule Take 1 capsule (25 mg total) by mouth 2 (two) times daily as needed for anxiety. 60 capsule 1   icosapent Ethyl (VASCEPA) 1 g capsule Take 2 capsules (2 g total) by mouth 2 (two) times daily. 360 capsule 1   losartan (COZAAR) 50 MG tablet Take 1 tablet (50 mg total) by mouth daily. 90 tablet 1   meloxicam (MOBIC) 15 MG tablet Take 1 tablet (15 mg total) by mouth daily. 90 tablet 1   mupirocin ointment (BACTROBAN) 2 % APPLY 1 APPLICATION TOPICALLY 2 TIMES DAILY 22 g 0   niacin (NIASPAN) 1000 MG CR tablet Take 2 tablets (2,000 mg total) by mouth at bedtime. 180 tablet 2   pantoprazole (PROTONIX) 20 MG tablet Take 1 tablet (20 mg total) by mouth daily. 90 tablet 1   rosuvastatin (CRESTOR) 40 MG tablet Take 1 tablet (40 mg total) by mouth every evening. 90 tablet 1   tamsulosin (FLOMAX) 0.4 MG  CAPS capsule Take 1 capsule (0.4  mg total) by mouth daily. 90 capsule 1   testosterone cypionate (DEPOTESTOSTERONE CYPIONATE) 200 MG/ML injection INJECT 0.75 ML INTRAMUSCULARLY EVERY 10 DAYS 10 mL 0   vitamin B-12 (CYANOCOBALAMIN) 1000 MCG tablet Take 1,000 mcg by mouth daily.     No current facility-administered medications for this visit.     Musculoskeletal: Strength & Muscle Tone: within normal limits Gait & Station: normal Patient leans: N/A  Psychiatric Specialty Exam: Review of Systems  Psychiatric/Behavioral:  Positive for dysphoric mood and sleep disturbance. The patient is nervous/anxious.     Blood pressure 135/71, pulse 85, temperature (!) 97.4 F (36.3 C), temperature source Temporal, height 6\' 1"  (1.854 m), weight 215 lb (97.5 kg).Body mass index is 28.37 kg/m.  General Appearance: Fairly Groomed  Eye Contact:  Fair  Speech:  Clear and Coherent  Volume:  Normal  Mood:  Anxious and Depressed  Affect:  Congruent  Thought Process:  Goal Directed and Descriptions of Associations: Intact  Orientation:  Full (Time, Place, and Person)  Thought Content: Logical   Suicidal Thoughts:  No  Homicidal Thoughts:  No  Memory:  Immediate;   Fair Recent;   Fair Remote;   Fair  Judgement:  Fair  Insight:  Fair  Psychomotor Activity:  Normal  Concentration:  Concentration: Fair and Attention Span: Fair  Recall:  [employer] of Knowledge: Fair  Language: Fair  Akathisia:  No  Handed:  Right  AIMS (if indicated): not done  Assets:  Desire for Improvement Housing Social Support Transportation  ADL's:  Intact  Cognition: WNL  Sleep:   Excessive   Screenings: Geneticist, molecular Office Visit from 04/09/2022 in [facility] Office Visit from 02/06/2022 in [facility]  AIMS Total Score 0 0      GAD-7    Flowsheet Row Office Visit from 12/30/2023 in [facility] Office Visit from  11/28/2023 in [facility] Office Visit from 11/21/2023 in [facility] Office Visit from 11/13/2023 in [facility] Office Visit from 07/01/2023 in [facility]  Total GAD-7 Score 12 7 5 3 3       PHQ2-9    Flowsheet Row Office Visit from 12/30/2023 in [facility] Office Visit from 11/28/2023 in [facility] Office Visit from 11/21/2023 in [facility] Office Visit from 11/13/2023 in [facility] Office Visit from 07/01/2023 in [facility]  PHQ-2 Total Score 4 2 2 2 2   PHQ-9 Total Score 13 7 10 4 5       Flowsheet Row Office Visit from 12/30/2023 in [facility] Office Visit from 07/01/2023 in [facility] Office Visit from 05/07/2023 in [facility]  C-SSRS RISK CATEGORY No Risk No Risk No Risk       Assessment and Plan: Kenneth Pruitt is a 73 year old Caucasian male who has a history of MDD, GAD, multiple medical problems including depression, anxiety, was evaluated in office today, discussed assessment and plan as noted below.  Assessment & Plan Depression-unstable He experiences depressive symptoms, including lack of motivation, excessive sleep, and feelings of uselessness. Currently on Cymbalta 80 mg and Wellbutrin 150 mg, with a plan to increase Wellbutrin to 300  mg to enhance energy and motivation. Potential side effects, such as increased anxiety, irritability, insomnia, and elevated blood pressure, were discussed. He will monitor blood pressure and heart rate. The adjustment may take weeks to show significant effects. - Increase Wellbutrin to 300 mg daily - Monitor blood pressure and heart rate - Provide a list of  therapists, including [email or web address], for psychotherapy - Schedule follow-up in 5-6 weeks to assess medication efficacy  Generalized anxiety disorder-unstable Anxiety symptoms mostly due to recent stressors.  Agreeable to establish care with therapist. - Provider list of therapist in the community.  Patient to start CBT - Continue Cymbalta 80 mg daily - Continue Hydroxyzine 25 mg twice a day as needed  Insomnia/sleep Apnea-unstable Uses CPAP for sleep apnea, currently does report excessive sleepiness.  Unknown if due to current mood symptoms versus problems with CPAP.  Recently acquired a new machine and plans to change suppliers due to dissatisfaction. Proper CPAP function is vital . - Ensure CPAP machine is functioning properly - Change CPAP supplier if necessary  Follow-up Follow-up in 5-6 weeks to evaluate Wellbutrin dosage efficacy. Video visit option discussed if in-person appointment is unavailable. - Schedule follow-up appointment in 5-6 weeks - Consider video visit if in-person appointment is unavailable    Collaboration of Care: Collaboration of Care: Referral or follow-up with counselor/therapist AEB encouraged to establish care with therapist.  Patient/Guardian was advised Release of Information must be obtained prior to any record release in order to collaborate their care with an outside provider. Patient/Guardian was advised if they have not already done so to contact the registration department to sign all necessary forms in order for us  to release information regarding their care.   Consent: Patient/Guardian gives verbal consent for treatment and assignment of benefits for services provided during this visit. Patient/Guardian expressed understanding and agreed to proceed.  This note was generated in part or whole with voice recognition software. Voice recognition is usually quite accurate but there are transcription errors that can and very often do occur. I  apologize for any typographical errors that were not detected and corrected.     Aliza Moret, MD 12/30/2023, 6:50 PM

## 2024-01-09 ENCOUNTER — Other Ambulatory Visit

## 2024-01-09 DIAGNOSIS — R972 Elevated prostate specific antigen [PSA]: Secondary | ICD-10-CM

## 2024-01-09 DIAGNOSIS — N289 Disorder of kidney and ureter, unspecified: Secondary | ICD-10-CM

## 2024-01-10 ENCOUNTER — Encounter: Payer: Self-pay | Admitting: Family Medicine

## 2024-01-10 ENCOUNTER — Other Ambulatory Visit: Payer: Self-pay | Admitting: Family Medicine

## 2024-01-10 DIAGNOSIS — I129 Hypertensive chronic kidney disease with stage 1 through stage 4 chronic kidney disease, or unspecified chronic kidney disease: Secondary | ICD-10-CM

## 2024-01-10 LAB — BASIC METABOLIC PANEL WITH GFR
BUN/Creatinine Ratio: 13 (ref 10–24)
BUN: 19 mg/dL (ref 8–27)
CO2: 25 mmol/L (ref 20–29)
Calcium: 9.4 mg/dL (ref 8.6–10.2)
Chloride: 97 mmol/L (ref 96–106)
Creatinine, Ser: 1.52 mg/dL — ABNORMAL HIGH (ref 0.76–1.27)
Glucose: 96 mg/dL (ref 70–99)
Potassium: 3.3 mmol/L — ABNORMAL LOW (ref 3.5–5.2)
Sodium: 138 mmol/L (ref 134–144)
eGFR: 48 mL/min/{1.73_m2} — ABNORMAL LOW (ref >59)

## 2024-01-10 LAB — PSA: Prostate Specific Ag, Serum: 4 ng/mL (ref 0.0–4.0)

## 2024-01-28 ENCOUNTER — Ambulatory Visit: Payer: Medicare PPO | Admitting: Dermatology

## 2024-01-28 ENCOUNTER — Encounter: Payer: Self-pay | Admitting: Dermatology

## 2024-01-28 DIAGNOSIS — L578 Other skin changes due to chronic exposure to nonionizing radiation: Secondary | ICD-10-CM

## 2024-01-28 DIAGNOSIS — L729 Follicular cyst of the skin and subcutaneous tissue, unspecified: Secondary | ICD-10-CM

## 2024-01-28 DIAGNOSIS — L814 Other melanin hyperpigmentation: Secondary | ICD-10-CM

## 2024-01-28 DIAGNOSIS — L570 Actinic keratosis: Secondary | ICD-10-CM | POA: Diagnosis not present

## 2024-01-28 DIAGNOSIS — Z7189 Other specified counseling: Secondary | ICD-10-CM

## 2024-01-28 DIAGNOSIS — Z1283 Encounter for screening for malignant neoplasm of skin: Secondary | ICD-10-CM | POA: Diagnosis not present

## 2024-01-28 DIAGNOSIS — L720 Epidermal cyst: Secondary | ICD-10-CM

## 2024-01-28 DIAGNOSIS — D692 Other nonthrombocytopenic purpura: Secondary | ICD-10-CM

## 2024-01-28 DIAGNOSIS — D229 Melanocytic nevi, unspecified: Secondary | ICD-10-CM

## 2024-01-28 DIAGNOSIS — L438 Other lichen planus: Secondary | ICD-10-CM

## 2024-01-28 NOTE — Patient Instructions (Addendum)

## 2024-01-28 NOTE — Progress Notes (Signed)
 Follow-Up Visit   Subjective  Kenneth Pruitt is a 73 y.o. male who presents for the following: Skin Cancer Screening and Upper Body Skin Exam, hx of skin cancer ~ 30 years ago.   Patient c/o lichen planus in his mouth oral surgeon treating with antibiotics with a good response.  Cyst on his back growing and irritating patient would like this cyst removed.   The patient presents for Upper Body Skin Exam (UBSE) for skin cancer screening and mole check. The patient has spots, moles and lesions to be evaluated, some may be new or changing and the patient may have concern these could be cancer.  The following portions of the chart were reviewed this encounter and updated as appropriate: medications, allergies, medical history  Review of Systems:  No other skin or systemic complaints except as noted in HPI or Assessment and Plan.  Objective  Well appearing patient in no apparent distress; mood and affect are within normal limits.  All skin waist up examined. Relevant physical exam findings are noted in the Assessment and Plan.  face,ears x 10 (10) Erythematous thin papules/macules with gritty scale.      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  - Recommend staying in the shade or wearing long sleeves, sun glasses (UVA+UVB protection) and wide brim hats (4-inch brim around the entire circumference of the hat). - Call for new or changing lesions.  Destruction of lesion - face,ears x 10 (10) Complexity: simple   Destruction method: cryotherapy   Informed consent: discussed and consent obtained   Timeout:  patient name, date of birth, surgical site, and procedure verified Lesion destroyed  using liquid nitrogen: Yes   Region frozen until ice ball extended beyond lesion: Yes   Outcome: patient tolerated procedure well with no complications   Post-procedure details: wound care instructions given    Skin cancer screening performed today.   Lentigines, Seborrheic Keratoses, Hemangiomas - Benign normal skin lesions - Benign-appearing - Call for any changes  Melanocytic Nevi - Tan-brown and/or pink-flesh-colored symmetric macules and papules - Benign appearing on exam today - Observation - Call clinic for new or changing moles - Recommend daily use of broad spectrum spf 30+ sunscreen to sun-exposed areas.   EPIDERMAL INCLUSION CYST Exam: 1 cm  Subcutaneous nodule on the left mid back  SEE PHOTO Benign-appearing. Exam most consistent with an epidermal inclusion cyst. Discussed that a cyst is a benign growth that can grow over time and sometimes get irritated or inflamed. Recommend observation if it is not bothersome. Discussed option of surgical excision to remove it if it is growing, symptomatic, or other changes noted. Please call for new or changing lesions so they can be evaluated. Schedule surgery with Dr Felipe Horton    Purpura - Chronic; persistent and recurrent.  Treatable, but not curable. - Violaceous macules and patches - Benign - Related to trauma, age, sun damage and/or use of blood thinners, chronic use of topical and/or oral steroids - Observe - Can use OTC arnica containing moisturizer such as Dermend Bruise Formula if desired - Call for worsening or other concerns   Oral LICHEN PLANUS  White lace appearing area in the mouth   Follow up with Oral Surgeon who diagnosed  and is treating pt.   HISTORY OF SKIN CANCER Left arm - Clear. Observe for recurrence.  - Call clinic for new or changing lesions.   - Recommend regular skin exams, daily broad-spectrum spf 30+ sunscreen use, and photoprotection.       Return in about 1 year (around 01/27/2025) for TBSE, hx  skin cancer and schedule cyst surgery with Dr Felipe Horton.  IClara Pruitt, CMA, am acting as scribe for Kenneth Collard, MD .   Documentation: I have reviewed the above documentation for accuracy and completeness, and I agree with the above.  Kenneth Collard, MD

## 2024-01-29 ENCOUNTER — Other Ambulatory Visit: Payer: Self-pay | Admitting: Nephrology

## 2024-01-29 DIAGNOSIS — N1831 Chronic kidney disease, stage 3a: Secondary | ICD-10-CM

## 2024-01-29 DIAGNOSIS — R8290 Unspecified abnormal findings in urine: Secondary | ICD-10-CM

## 2024-01-29 DIAGNOSIS — R809 Proteinuria, unspecified: Secondary | ICD-10-CM

## 2024-02-04 ENCOUNTER — Ambulatory Visit
Admission: RE | Admit: 2024-02-04 | Discharge: 2024-02-04 | Disposition: A | Discharge status: Home / Self Care | Source: Ambulatory Visit | Attending: Nephrology | Admitting: Nephrology

## 2024-02-04 DIAGNOSIS — R8290 Unspecified abnormal findings in urine: Secondary | ICD-10-CM | POA: Insufficient documentation

## 2024-02-04 DIAGNOSIS — N1831 Chronic kidney disease, stage 3a: Secondary | ICD-10-CM | POA: Diagnosis present

## 2024-02-04 DIAGNOSIS — R809 Proteinuria, unspecified: Secondary | ICD-10-CM | POA: Diagnosis present

## 2024-02-11 ENCOUNTER — Encounter: Payer: Self-pay | Admitting: Psychiatry

## 2024-02-11 ENCOUNTER — Telehealth (INDEPENDENT_AMBULATORY_CARE_PROVIDER_SITE_OTHER): Admitting: Psychiatry

## 2024-02-11 DIAGNOSIS — F411 Generalized anxiety disorder: Secondary | ICD-10-CM | POA: Diagnosis not present

## 2024-02-11 DIAGNOSIS — F3341 Major depressive disorder, recurrent, in partial remission: Secondary | ICD-10-CM | POA: Insufficient documentation

## 2024-02-11 DIAGNOSIS — G4701 Insomnia due to medical condition: Secondary | ICD-10-CM

## 2024-02-11 NOTE — Progress Notes (Signed)
 Virtual Visit via Video Note  I connected with Kenneth Pruitt on 02/11/24 at 10:30 AM EDT by a video enabled telemedicine application and verified that I am speaking with the correct person using two identifiers.  Location Provider Location : [facility] Patient Location : Home  Participants: Patient , Provider    I discussed the limitations of evaluation and management by telemedicine and the availability of in person appointments. The patient expressed understanding and agreed to proceed.   I discussed the assessment and treatment plan with the patient. The patient was provided an opportunity to ask questions and all were answered. The patient agreed with the plan and demonstrated an understanding of the instructions.   The patient was advised to call back or seek an in-person evaluation if the symptoms worsen or if the condition fails to improve as anticipated.  Video connection was lost at less than 50% of the duration of the visit, at which time the remainder of the visit was completed through audio only    [facility] MD OP Progress Note  02/11/2024 3:21 PM Kenneth Pruitt  MRN:  [id number]  Chief Complaint:  Chief Complaint  Patient presents with   Follow-up   Anxiety   Depression   Medication Refill   Discussed the use of AI scribe software for clinical note transcription with the patient, who gave verbal consent to proceed.  History of Present Illness Kenneth Pruitt "Kenneth Pruitt" is a 73 year old Caucasian male, retired, married, lives in [place], has a history of MDD, GAD, insomnia, coronary artery disease status post stent placement, knee pain, multiple surgeries, testosterone  deficiency, presents for a follow-up appointment.  He has a history of depression and generalized anxiety, previously experiencing symptoms such as lack of motivation and increased sleepiness. His Wellbutrin  dosage was increased to 300 mg, and he continues on Cymbalta  and hydroxyzine  as needed. He notes  improvement in energy levels after discontinuing hydralazine , which he believes was causing low blood pressure and fatigue. No thoughts of self-harm or harm to others. No current sleep issues or excessive daytime sleepiness. Appetite is stable.  A recent fall a few weeks ago due to clumsiness related to nerve damage from previous surgeries resulted in landing on his hip, leading to back pain. An MRI was conducted last Friday, and he is awaiting results. X-rays indicated a possible broken screw, but he is unsure if this is related to the fall. Pain is experienced when bending over but not when still.  He has a history of renal function impairment and is currently under the care of a nephrologist.. He is on spironolactone, which was started after his GFR dropped. Kidney function is monitored biannually.  He does have upcoming appointment with nephrology on June 17.  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 A steroidal mouthwash is used for management, significantly improving symptoms. A biopsy confirmed the diagnosis, and no current pain in his tongue is reported, though some sores persist on the inside of his right cheek.  He has lost 21 pounds due to the oral sores affecting his eating, which he views positively for his biking activities. He has resumed biking and engages in making oboe and English horn reeds at home.  Denies any suicidality, homicidality or perceptual disturbances.    Visit Diagnosis:    ICD-10-CM   1. Recurrent major depressive disorder, in partial remission (HCC)  F33.41     2. GAD (generalized anxiety disorder)  F41.1  3. Insomnia due to medical condition  G47.01    depression, OSA on CPAP      Past Psychiatric History: I have reviewed past psychiatric history from progress note on 12/16/2017.  Past Medical History:  Past Medical History:  Diagnosis Date   Arthritis    Coronary artery disease    Depression    Hyperlipidemia     Skin cancer    left forearm treated years ago by Dr. Modena Andes    Past Surgical History:  Procedure Laterality Date   ANKLE FRACTURE SURGERY Right    ARTHRODESIS ANT INTERBODY INC DISCECTOMY, CERVICAL BELOW C2    05/2019   CARDIAC CATHETERIZATION N/A 04/26/2015   Procedure: Left Heart Cath;  Surgeon: Cherrie Cornwall, MD;  Location: [facility] INVASIVE CV LAB;  Service: Cardiovascular;  Laterality: N/A;   CLAVICLE SURGERY Right 05/04/2021   CORONARY ANGIOPLASTY     CORONARY STENT INTERVENTION N/A 03/27/2018   Procedure: CORONARY STENT INTERVENTION;  Surgeon: Antonette Batters, MD;  Location: [facility] INVASIVE CV LAB;  Service: Cardiovascular;  Laterality: N/A;   EYE SURGERY Bilateral    cataract   FOOT SURGERY Left 08/22/2022   FOOT SURGERY Right 10/31/2022   LEFT HEART CATH AND CORONARY ANGIOGRAPHY Left 03/27/2018   Procedure: Left Heart Cath with possible coronary intervention;  Surgeon: Cherrie Cornwall, MD;  [facility] INVASIVE CV LAB;  Service: Cardiovascular;  Laterality: Left;   LUMBAR FUSION  05/08/2022   TOTAL KNEE ARTHROPLASTY Right 10/08/2018   Procedure: TOTAL KNEE ARTHROPLASTY;  Surgeon: Jerlyn Moons, MD;  Location: [facility] ORS;  Service: Orthopedics;  Laterality: Right;    Family Psychiatric History: I have reviewed family psychiatric history from progress note on 12/16/2017.  Family History:  Family History  Problem Relation Age of Onset   Diabetes Mother    Hypertension Mother    Cancer Father    Depression Father    Anxiety disorder Father    Depression Daughter     Social History: I have reviewed social history from progress note on 12/16/2017. Social History   Socioeconomic History   Marital status: Married    Spouse name: ann   Number of children: 2   Years of education: Not on file   Highest education level: Master's degree (e.g., MA, MS, MEng, MEd, MSW, MBA)  Occupational History    Comment: retired  Tobacco Use   Smoking status: Former    Types:  Cigars, Cigarettes    Quit date: 07/18/2006    Years since quitting: 17.5   Smokeless tobacco: Never  Vaping Use   Vaping status: Never Used  Substance and Sexual Activity   Alcohol use: No   Drug use: No   Sexual activity: Not Currently  Other Topics Concern   Not on file  Social History Narrative   Not on file   Social Drivers of Health   Financial Resource Strain: Low Risk  (11/28/2023)   Overall Financial Resource Strain (CARDIA)    Difficulty of Paying Living Expenses: Not hard at all  Food Insecurity: No Food Insecurity (11/28/2023)   Hunger Vital Sign    Worried About Running Out of Food in the Last Year: Never true    Ran Out of Food in the Last Year: Never true  Transportation Needs: No Transportation Needs (11/28/2023)   PRAPARE - Administrator, Civil Service (Medical): No    Lack of Transportation (Non-Medical): No  Physical Activity: Insufficiently Active (11/28/2023)   Exercise Vital Sign  Days of Exercise per Week: 3 days    Minutes of Exercise per Session: 30 min  Stress: No Stress Concern Present (11/28/2023)   [employer] of Occupational Health - Occupational Stress Questionnaire    Feeling of Stress : Not at all  Social Connections: Socially Integrated (11/28/2023)   Social Connection and Isolation Panel [NHANES]    Frequency of Communication with Friends and Family: Twice a week    Frequency of Social Gatherings with Friends and Family: Once a week    Attends Religious Services: More than 4 times per year    Active Member of [employer] or Organizations: Yes    Attends Engineer, structural: More than 4 times per year    Marital Status: Married    Allergies:  Allergies  Allergen Reactions   Clonidine  Derivatives Shortness Of Breath and Other (See Comments)    Low heart rate   Clonidine      Mild bradycardia + Somnolence    Metabolic Disorder Labs: Lab Results  Component Value Date   HGBA1C 6.1 (H) 05/01/2022   No results  found for: "PROLACTIN" Lab Results  Component Value Date   CHOL 87 (L) 11/29/2023   TRIG 130 11/29/2023   HDL 37 (L) 11/29/2023   CHOLHDL 2.5 08/20/2019   VLDL 37 03/28/2018   LDLCALC 27 11/29/2023   LDLCALC 27 05/29/2023   Lab Results  Component Value Date   TSH 1.320 11/29/2023   TSH 1.120 11/26/2022    Therapeutic Level Labs: No results found for: "LITHIUM" No results found for: "VALPROATE" No results found for: "CBMZ"  Current Medications: Current Outpatient Medications  Medication Sig Dispense Refill   spironolactone (ALDACTONE) 25 MG tablet Take 25 mg by mouth.     acetaminophen  (TYLENOL ) 500 MG tablet Take by mouth.     aspirin  81 MG EC tablet Take 1 tablet (81 mg total) by mouth daily. 90 tablet 3   buPROPion  (WELLBUTRIN  XL) 300 MG 24 hr tablet Take 1 tablet (300 mg total) by mouth in the morning. Stop the 150 mg , dose increase 90 tablet 1   chlorthalidone  (HYGROTON ) 25 MG tablet Take 1 tablet (25 mg total) by mouth daily. 90 tablet 1   Cholecalciferol  125 MCG (5000 UT) capsule Take 5,000 Units by mouth daily.     clopidogrel  (PLAVIX ) 75 MG tablet Take 1 tablet (75 mg total) by mouth daily. 90 tablet 1   clotrimazole -betamethasone  (LOTRISONE ) cream Apply 1 application topically 2 (two) times daily. 30 g 0   dexamethasone  (DECADRON ) 0.5 MG/5ML solution      diclofenac Sodium (VOLTAREN) 1 % GEL      DULoxetine  (CYMBALTA ) 20 MG capsule Take 20 mg daily along with 60 mg - total of 80 mg . 90 capsule 3   DULoxetine  (CYMBALTA ) 60 MG capsule TAKE 1 CAPSULE BY MOUTH DAILY ALONG WITHTHE 20MG  FOR A TOTAL OF 80MG  90 capsule 3   dutasteride  (AVODART ) 0.5 MG capsule TAKE 1 CAPSULE BY MOUTH EVERY DAY IN THEEVENING 90 capsule 1   fluticasone  (FLONASE ) 50 MCG/ACT nasal spray Place 2 sprays into both nostrils daily. 16 g 12   hydrocortisone  (ANUSOL -HC) 2.5 % rectal cream APPLY RECTALLY TWICE DAILY AS DIRECTED 30 g 0   hydrOXYzine  (VISTARIL ) 25 MG capsule Take 1 capsule (25 mg total)  by mouth 2 (two) times daily as needed for anxiety. 60 capsule 1   icosapent  Ethyl (VASCEPA ) 1 g capsule Take 2 capsules (2 g total) by mouth 2 (two) times daily.  360 capsule 1   losartan  (COZAAR ) 50 MG tablet Take 1 tablet (50 mg total) by mouth daily. 90 tablet 1   mupirocin  ointment (BACTROBAN ) 2 % APPLY 1 APPLICATION TOPICALLY 2 TIMES DAILY 22 g 0   niacin  (NIASPAN ) 1000 MG CR tablet Take 2 tablets (2,000 mg total) by mouth at bedtime. 180 tablet 2   pantoprazole  (PROTONIX ) 20 MG tablet Take 1 tablet (20 mg total) by mouth daily. 90 tablet 1   rosuvastatin  (CRESTOR ) 40 MG tablet Take 1 tablet (40 mg total) by mouth every evening. 90 tablet 1   tamsulosin  (FLOMAX ) 0.4 MG CAPS capsule Take 1 capsule (0.4 mg total) by mouth daily. 90 capsule 1   testosterone  cypionate (DEPOTESTOSTERONE CYPIONATE) 200 MG/ML injection INJECT 0.75 ML INTRAMUSCULARLY EVERY 10 DAYS 10 mL 0   vitamin B-12 (CYANOCOBALAMIN ) 1000 MCG tablet Take 1,000 mcg by mouth daily.     No current facility-administered medications for this visit.     Musculoskeletal: Strength & Muscle Tone: UTA Gait & Station: UTA Patient leans: N/A  Psychiatric Specialty Exam: Review of Systems  Psychiatric/Behavioral:  The patient is nervous/anxious.     There were no vitals taken for this visit.There is no height or weight on file to calculate BMI.  General Appearance: UTA  Eye Contact:  UTA  Speech:  Clear and Coherent  Volume:  Normal  Mood:  Anxious improving  Affect:  UTA  Thought Process:  Goal Directed and Descriptions of Associations: Intact  Orientation:  Full (Time, Place, and Person)  Thought Content: Logical   Suicidal Thoughts:  No  Homicidal Thoughts:  No  Memory:  Immediate;   Fair Recent;   Fair Remote;   Fair  Judgement:  Fair  Insight:  Fair  Psychomotor Activity:  NA  Concentration:  Concentration: Fair and Attention Span: Fair  Recall:  [employer] of Knowledge: Fair  Language: Fair  Akathisia:  No   Handed:  Right  AIMS (if indicated): not done  Assets:  Manufacturing systems engineer Desire for Improvement Housing Social Support Transportation  ADL's:  Intact  Cognition: WNL  Sleep:  Fair   Screenings: Geneticist, molecular Office Visit from 04/09/2022 in [facility] Office Visit from [facility]/06/2022 in [facility]  AIMS Total Score 0 0      GAD-7    Flowsheet Row Office Visit from 12/30/2023 in [facility] Office Visit from 11/28/2023 in [facility] Office Visit from 11/21/2023 in [facility] Office Visit from 11/13/2023 in [facility] Office Visit from 07/01/2023 in [facility]  Total GAD-7 Score 12 7 5 3 3       PHQ2-9    Flowsheet Row Office Visit from 12/30/2023 in [facility] Office Visit from 11/28/2023 in [facility] Office Visit from 11/21/2023 in [facility] Office Visit from 11/13/2023 in [facility] Office Visit from 07/01/2023 in [facility]  PHQ-2 Total Score 4 2 2 2 2   PHQ-9 Total Score 13 7 10 4 5       Flowsheet Row Video Visit from 02/11/2024 in [facility] Office Visit from 12/30/2023 in [facility] Office Visit from 07/01/2023 in [facility]  C-SSRS RISK CATEGORY No  Risk No Risk No Risk        Assessment and Plan: MONTRAIL MEHRER is a 73 year old Caucasian male who has a history of MDD, GAD, multiple medical problems was evaluated by phone today.  Discussed assessment and plan as noted below.  Depression-in partial remission Currently depression symptoms are  improving on the current medication regimen which includes Cymbalta  and the recent increase in Wellbutrin  dosage.  Was referred to establish care with a therapist however has not been able to do so. - Continue Wellbutrin  300 mg daily - Continue Cymbalta  80 mg daily - Encouraged to establish care with therapist.  Provider resources in the community. - Encouraged to discuss with nephrology regarding Cymbalta  and Wellbutrin  safety due to recent renal function impairment.  Generalized anxiety disorder-improving Anxiety symptoms are manageable. - Continue Cymbalta  80 mg daily - Continue Hydroxyzine  25 mg twice a day as needed.  Insomnia/Sleep Apnea-improving Currently reports sleep is overall improving. - Encourage continued use of CPAP.  Follow-up Follow-up in clinic in 3 months or sooner if needed.    Collaboration of Care: Collaboration of Care: Referral or follow-up with counselor/therapist AEB encouraged to establish care with therapist.  Patient/Guardian was advised Release of Information must be obtained prior to any record release in order to collaborate their care with an outside provider. Patient/Guardian was advised if they have not already done so to contact the registration department to sign all necessary forms in order for us  to release information regarding their care.   Consent: Patient/Guardian gives verbal consent for treatment and assignment of benefits for services provided during this visit. Patient/Guardian expressed understanding and agreed to proceed.    I have spent at least 15 minutes non face to face with patient today.  This note was generated in part or whole with voice recognition software. Voice recognition is usually quite accurate but there are transcription errors that can and very often do occur. I apologize for any typographical errors that were not detected and corrected.      Sahory Nordling, MD 02/11/2024, 3:21 PM

## 2024-02-28 ENCOUNTER — Other Ambulatory Visit: Payer: Self-pay | Admitting: Psychiatry

## 2024-02-28 ENCOUNTER — Other Ambulatory Visit: Payer: Self-pay | Admitting: Family Medicine

## 2024-02-28 DIAGNOSIS — E782 Mixed hyperlipidemia: Secondary | ICD-10-CM

## 2024-02-28 DIAGNOSIS — F411 Generalized anxiety disorder: Secondary | ICD-10-CM

## 2024-03-02 ENCOUNTER — Other Ambulatory Visit: Payer: Self-pay | Admitting: Family Medicine

## 2024-03-02 NOTE — Telephone Encounter (Signed)
 Requested Prescriptions  Refused Prescriptions Disp Refills   rosuvastatin  (CRESTOR ) 40 MG tablet [Pharmacy Med Name: ROSUVASTATIN  CALCIUM  40 MG TAB] 90 tablet 1    Sig: TAKE 1 TABLET BY MOUTH EVERY EVENING     Cardiovascular:  Antilipid - Statins 2 Failed - 03/02/2024  3:59 PM      Failed - Cr in normal range and within 360 days    Creatinine, Ser  Date Value Ref Range Status  01/09/2024 1.52 (H) 0.76 - 1.27 mg/dL Final         Failed - Lipid Panel in normal range within the last 12 months    Cholesterol, Total  Date Value Ref Range Status  11/29/2023 87 (L) 100 - 199 mg/dL Final   Cholesterol Piccolo, Waived  Date Value Ref Range Status  08/06/2016 115 <200 mg/dL Final    Comment:                            Desirable                <200                         Borderline High      200- 239                         High                     >239    LDL Chol Calc (NIH)  Date Value Ref Range Status  11/29/2023 27 0 - 99 mg/dL Final   HDL  Date Value Ref Range Status  11/29/2023 37 (L) >39 mg/dL Final   Triglycerides  Date Value Ref Range Status  11/29/2023 130 0 - 149 mg/dL Final   Triglycerides Piccolo,Waived  Date Value Ref Range Status  08/06/2016 188 (H) <150 mg/dL Final    Comment:                            Normal                   <150                         Borderline High     150 - 199                         High                200 - 499                         Very High                >499          Passed - Patient is not pregnant      Passed - Valid encounter within last 12 months    Recent Outpatient Visits           3 months ago Encounter for [employer] annual wellness exam   [facility] [place], [name], DO   3 months ago [employer]   [facility], [name], DO   3 months ago [employer]  [facility] Aileen Alexanders, NP       Future Appointments             In 3 weeks  Cherrie Cornwall, MD [facility]   In 3 months [name], Kizzie Perks, MD [facility] [place]   In 7 months Elta Halter, MD [facility]

## 2024-03-04 ENCOUNTER — Encounter: Payer: Self-pay | Admitting: Dermatology

## 2024-03-04 ENCOUNTER — Ambulatory Visit: Admitting: Dermatology

## 2024-03-04 DIAGNOSIS — L720 Epidermal cyst: Secondary | ICD-10-CM | POA: Diagnosis not present

## 2024-03-04 MED ORDER — MUPIROCIN 2 % EX OINT
1.0000 | TOPICAL_OINTMENT | Freq: Every day | CUTANEOUS | 0 refills | Status: AC
Start: 2024-03-04 — End: ?

## 2024-03-04 NOTE — Telephone Encounter (Signed)
 Requested medication (s) are due for refill today: review  Requested medication (s) are on the active medication list: yes  Last refill:  09/03/22  Future visit scheduled: no  Notes to clinic:  Unable to refill per protocol, cannot delegate.      Requested Prescriptions  Pending Prescriptions Disp Refills   hydrocortisone  (ANUSOL -HC) 2.5 % rectal cream [Pharmacy Med Name: HYDROCORTISONE  (PERIANAL) 2.5% TOP] 30 g 0    Sig: APPLY RECTALLY TWICE DAILY AS DIRECTED     Not Delegated - Over the Counter: OTC 2 Failed - 03/04/2024  4:22 PM      Failed - This refill cannot be delegated      Passed - Valid encounter within last 12 months    Recent Outpatient Visits           3 months ago Encounter for [employer] annual wellness exam   [place] [facility] Proberta, [place], DO   3 months ago [employer]   [place] [facility] [place], [place], DO   3 months ago [place]   [place] [facility] Aileen Alexanders, NP       Future Appointments             In 2 weeks Cherrie Cornwall, MD [facility]   In 3 months [name], Kizzie Perks, MD [facility] [place]   In 6 months Elta Halter, MD [facility]

## 2024-03-04 NOTE — Patient Instructions (Signed)

## 2024-03-04 NOTE — Progress Notes (Signed)
   Follow-Up Visit   Subjective  Kenneth Pruitt is a 73 y.o. male who presents for the following: Excision of cyst at left mid back  The following portions of the chart were reviewed this encounter and updated as appropriate: medications, allergies, medical history  Review of Systems:  No other skin or systemic complaints except as noted in HPI or Assessment and Plan.  Objective  Well appearing patient in no apparent distress; mood and affect are within normal limits.  A focused examination was performed of the following areas: back Relevant physical exam findings are noted in the Assessment and Plan.   Left Mid Back 1 cm  Subcutaneous nodule   Assessment & Plan   EPIDERMAL INCLUSION CYST Left Mid Back Skin excision - Left Mid Back  Excision method:  elliptical Lesion length (cm):  1.2 Total excision diameter (cm):  1.2 Informed consent: discussed and consent obtained   Timeout: patient name, date of birth, surgical site, and procedure verified   Procedure prep:  Patient was prepped and draped in usual sterile fashion Prep type:  Chlorhexidine  Anesthesia: the lesion was anesthetized in a standard fashion   Anesthetic:  1% lidocaine  w/ epinephrine  1-100,000 buffered w/ 8.4% NaHCO3 (12 cc) Instrument used: #15 blade   Hemostasis achieved with: suture, pressure and electrodesiccation   Outcome: patient tolerated procedure well with no complications    Skin repair - Left Mid Back Complexity:  Intermediate Final length (cm):  3 Informed consent: discussed and consent obtained   Timeout: patient name, date of birth, surgical site, and procedure verified   Procedure prep:  Patient was prepped and draped in usual sterile fashion Prep type:  Chlorhexidine  Anesthesia: the lesion was anesthetized in a standard fashion   Anesthetic:  1% lidocaine  w/ epinephrine  1-100,000 buffered w/ 8.4% NaHCO3 Reason for type of repair: reduce tension to allow closure, reduce the risk of  dehiscence, infection, and necrosis, reduce subcutaneous dead space and avoid a hematoma, allow closure of the large defect and preserve normal anatomy   Undermining: edges could be approximated without difficulty   Subcutaneous layers (deep stitches):  Suture size:  3-0 Suture type: Monocryl (poliglecaprone 25)   Stitches:  Buried vertical mattress Fine/surface layer approximation (top stitches):  Suture size:  4-0 Suture type: Prolene (polypropylene)   Stitches comment:  Running locked Suture removal (days):  14 Hemostasis achieved with: suture, pressure and electrodesiccation Outcome: patient tolerated procedure well with no complications   Post-procedure details: sterile dressing applied and wound care instructions given   Dressing type: petrolatum, bandage and pressure dressing   Specimen 1 - Surgical pathology Differential Diagnosis: Cyst vs Other  Check Margins: No 1 cm Subcutaneous nodule   Return in about 2 weeks (around 03/18/2024) for Suture Removal.  Kerstin Peeling, RMA, am acting as scribe for Harris Liming, MD .   Documentation: I have reviewed the above documentation for accuracy and completeness, and I agree with the above.  Harris Liming, MD

## 2024-03-05 ENCOUNTER — Inpatient Hospital Stay: Payer: Medicare PPO

## 2024-03-05 ENCOUNTER — Inpatient Hospital Stay: Payer: Medicare PPO | Admitting: Nurse Practitioner

## 2024-03-05 ENCOUNTER — Telehealth: Payer: Self-pay

## 2024-03-05 ENCOUNTER — Inpatient Hospital Stay: Payer: Medicare PPO | Attending: Oncology

## 2024-03-05 ENCOUNTER — Encounter: Payer: Self-pay | Admitting: Nurse Practitioner

## 2024-03-05 VITALS — BP 124/69 | HR 90 | Temp 97.6°F | Resp 18 | Ht 73.0 in | Wt 200.0 lb

## 2024-03-05 DIAGNOSIS — Z87891 Personal history of nicotine dependence: Secondary | ICD-10-CM | POA: Insufficient documentation

## 2024-03-05 DIAGNOSIS — E291 Testicular hypofunction: Secondary | ICD-10-CM | POA: Diagnosis not present

## 2024-03-05 DIAGNOSIS — D751 Secondary polycythemia: Secondary | ICD-10-CM | POA: Insufficient documentation

## 2024-03-05 DIAGNOSIS — I10 Essential (primary) hypertension: Secondary | ICD-10-CM | POA: Insufficient documentation

## 2024-03-05 LAB — CBC WITH DIFFERENTIAL (CANCER CENTER ONLY)
Abs Immature Granulocytes: 0.02 10*3/uL (ref 0.00–0.07)
Basophils Absolute: 0 10*3/uL (ref 0.0–0.1)
Basophils Relative: 1 %
Eosinophils Absolute: 0 10*3/uL (ref 0.0–0.5)
Eosinophils Relative: 0 %
HCT: 47.4 % (ref 39.0–52.0)
Hemoglobin: 15.8 g/dL (ref 13.0–17.0)
Immature Granulocytes: 0 %
Lymphocytes Relative: 12 %
Lymphs Abs: 0.7 10*3/uL (ref 0.7–4.0)
MCH: 29.6 pg (ref 26.0–34.0)
MCHC: 33.3 g/dL (ref 30.0–36.0)
MCV: 88.9 fL (ref 80.0–100.0)
Monocytes Absolute: 0.5 10*3/uL (ref 0.1–1.0)
Monocytes Relative: 7 %
Neutro Abs: 5 10*3/uL (ref 1.7–7.7)
Neutrophils Relative %: 80 %
Platelet Count: 251 10*3/uL (ref 150–400)
RBC: 5.33 MIL/uL (ref 4.22–5.81)
RDW: 14.8 % (ref 11.5–15.5)
WBC Count: 6.3 10*3/uL (ref 4.0–10.5)
nRBC: 0 % (ref 0.0–0.2)

## 2024-03-05 NOTE — Progress Notes (Signed)
 [facility]  [phone] [phone]  ID: Kenneth Pruitt OB: 1951/01/01  MR#: [id number]  AOZ#:[id number]  Patient Care Team: Solomon Dupre, DO as PCP - General (Family Medicine) Artemio Larry, MD (Dermatology) Marlynn Singer, MD (Specialist) Cherrie Cornwall, MD as Consulting Physician (Cardiology) Ulanda Gambles, Arcelia Knudsen (Inactive) as Attending Physician (Podiatry) Shellie Dials, MD as Consulting Physician (Oncology) Shellie Dials, MD as Consulting Physician (Oncology)  CHIEF COMPLAINT: Secondary polycythemia  INTERVAL HISTORY: Patient returns to clinic today for repeat laboratory work, further evaluation, and consideration of additional phlebotomy. He has not required phlebotomy is > 1 year. Continues to intermittently use testosterone . Enjoying retirement. Has had injection in his back. Is followed by nephrology. Denies complaints.   REVIEW OF SYSTEMS:   Review of Systems  Constitutional: Negative.  Negative for fever, malaise/fatigue and weight loss.  Respiratory: Negative.  Negative for cough and shortness of breath.   Cardiovascular: Negative.  Negative for chest pain and leg swelling.  Gastrointestinal: Negative.  Negative for abdominal pain, blood in stool and melena.  Genitourinary: Negative.  Negative for dysuria.  Musculoskeletal: Negative.  Negative for back pain, joint pain and neck pain.  Skin: Negative.  Negative for rash.  Neurological: Negative.  Negative for dizziness, tingling, sensory change, focal weakness, weakness and headaches.  Psychiatric/Behavioral: Negative.  The patient is not nervous/anxious.   As per HPI. Otherwise, a complete review of systems is negative.  PAST MEDICAL HISTORY: Past Medical History:  Diagnosis Date   Arthritis    Coronary artery disease    Depression    Hyperlipidemia    Skin cancer    left forearm treated years ago by Dr. Modena Andes   PAST SURGICAL HISTORY: Past  Surgical History:  Procedure Laterality Date   ANKLE FRACTURE SURGERY Right    ARTHRODESIS ANT INTERBODY INC DISCECTOMY, CERVICAL BELOW C2    05/2019   CARDIAC CATHETERIZATION N/A 04/26/2015   Procedure: Left Heart Cath;  Surgeon: Cherrie Cornwall, MD;  Location: [facility] INVASIVE CV LAB;  Service: Cardiovascular;  Laterality: N/A;   CLAVICLE SURGERY Right 05/04/2021   CORONARY ANGIOPLASTY     CORONARY STENT INTERVENTION N/A 03/27/2018   Procedure: CORONARY STENT INTERVENTION;  Surgeon: Antonette Batters, MD;  Location: [facility] INVASIVE CV LAB;  Service: Cardiovascular;  Laterality: N/A;   EYE SURGERY Bilateral    cataract   FOOT SURGERY Left 08/22/2022   FOOT SURGERY Right 10/31/2022   LEFT HEART CATH AND CORONARY ANGIOGRAPHY Left 03/27/2018   Procedure: Left Heart Cath with possible coronary intervention;  Surgeon: Cherrie Cornwall, MD;  Location: [facility] INVASIVE CV LAB;  Service: Cardiovascular;  Laterality: Left;   LUMBAR FUSION  05/08/2022   TOTAL KNEE ARTHROPLASTY Right 10/08/2018   Procedure: TOTAL KNEE ARTHROPLASTY;  Surgeon: Jerlyn Moons, MD;  Location: [facility] ORS;  Service: Orthopedics;  Laterality: Right;   FAMILY HISTORY: Family History  Problem Relation Age of Onset   Diabetes Mother    Hypertension Mother    Cancer Father    Depression Father    Anxiety disorder Father    Depression Daughter    ADVANCED DIRECTIVES (Y/N):  N  HEALTH MAINTENANCE: Social History   Tobacco Use   Smoking status: Former    Types: Cigars, Cigarettes    Quit date: 07/18/2006    Years since quitting: 17.6   Smokeless tobacco: Never  Vaping Use   Vaping status: Never Used  Substance Use Topics   Alcohol use: No  Drug use: No  Loves dogs. Has 2 shiloh shepherds- [employer] Sadie   Colonoscopy:  Bone density:  Lipid panel:  Allergies  Allergen Reactions   Clonidine  Derivatives Other (See Comments) and Shortness Of Breath    Low heart rate  Low heart rate    Mild bradycardia +  Somnolence    Low heart rate Mild bradycardia + Somnolence Mild bradycardia + Somnolence    Low heart rate Mild bradycardia + Somnolence Mild bradycardia + Somnolence Mild bradycardia + Somnolence Mild bradycardia + Somnolence  clonidine    Clonidine      Mild bradycardia + Somnolence   Current Outpatient Medications  Medication Sig Dispense Refill   acetaminophen  (TYLENOL ) 500 MG tablet Take by mouth.     aspirin  81 MG EC tablet Take 1 tablet (81 mg total) by mouth daily. 90 tablet 3   buPROPion  (WELLBUTRIN  XL) 300 MG 24 hr tablet Take 1 tablet (300 mg total) by mouth in the morning. Stop the 150 mg , dose increase 90 tablet 1   chlorthalidone  (HYGROTON ) 25 MG tablet Take 1 tablet (25 mg total) by mouth daily. 90 tablet 1   Cholecalciferol  125 MCG (5000 UT) capsule Take 5,000 Units by mouth daily.     clobetasol (TEMOVATE) 0.05 % GEL Apply topically.     clopidogrel  (PLAVIX ) 75 MG tablet Take 1 tablet (75 mg total) by mouth daily. 90 tablet 1   clotrimazole -betamethasone  (LOTRISONE ) cream Apply 1 application topically 2 (two) times daily. 30 g 0   dexamethasone  (DECADRON ) 0.5 MG/5ML solution      diclofenac Sodium (VOLTAREN) 1 % GEL      DULoxetine  (CYMBALTA ) 20 MG capsule Take 20 mg daily along with 60 mg - total of 80 mg . 90 capsule 3   DULoxetine  (CYMBALTA ) 60 MG capsule TAKE 1 CAPSULE BY MOUTH DAILY ALONG WITHTHE 20MG  FOR A TOTAL OF 80MG  90 capsule 3   dutasteride  (AVODART ) 0.5 MG capsule TAKE 1 CAPSULE BY MOUTH EVERY DAY IN THEEVENING 90 capsule 1   fluticasone  (FLONASE ) 50 MCG/ACT nasal spray Place 2 sprays into both nostrils daily. 16 g 12   hydrocortisone  (ANUSOL -HC) 2.5 % rectal cream APPLY RECTALLY TWICE DAILY AS DIRECTED 30 g 0   hydrOXYzine  (VISTARIL ) 25 MG capsule Take 1 capsule (25 mg total) by mouth 2 (two) times daily as needed for anxiety. 60 capsule 1   icosapent  Ethyl (VASCEPA ) 1 g capsule Take 2 capsules (2 g total) by mouth 2 (two) times daily. 360 capsule 1    losartan  (COZAAR ) 50 MG tablet Take 1 tablet (50 mg total) by mouth daily. 90 tablet 1   mupirocin  ointment (BACTROBAN ) 2 % APPLY 1 APPLICATION TOPICALLY 2 TIMES DAILY 22 g 0   mupirocin  ointment (BACTROBAN ) 2 % Apply 1 Application topically daily. 22 g 0   niacin  (NIASPAN ) 1000 MG CR tablet Take 2 tablets (2,000 mg total) by mouth at bedtime. 180 tablet 2   pantoprazole  (PROTONIX ) 20 MG tablet Take 1 tablet (20 mg total) by mouth daily. 90 tablet 1   rosuvastatin  (CRESTOR ) 40 MG tablet Take 1 tablet (40 mg total) by mouth every evening. 90 tablet 1   spironolactone (ALDACTONE) 25 MG tablet Take 25 mg by mouth.     tamsulosin  (FLOMAX ) 0.4 MG CAPS capsule Take 1 capsule (0.4 mg total) by mouth daily. 90 capsule 1   testosterone  cypionate (DEPOTESTOSTERONE CYPIONATE) 200 MG/ML injection INJECT 0.75 ML INTRAMUSCULARLY EVERY 10 DAYS 10 mL 0   vitamin B-12 (CYANOCOBALAMIN )  1000 MCG tablet Take 1,000 mcg by mouth daily.     No current facility-administered medications for this visit.   OBJECTIVE: Vitals:   03/05/24 1323  BP: 124/69  Pulse: 90  Resp: 18  Temp: 97.6 F (36.4 C)   Body mass index is 26.39 kg/m.    ECOG FS:0 - Asymptomatic  General: Well-developed, well-nourished, no acute distress. Lungs: No audible wheezing or coughing Musculoskeletal: No edema, cyanosis, or clubbing.  Neuro: Alert, answering all questions appropriately. Skin: No rashes or petechiae noted.  Psych: Normal affect.  LAB RESULTS: Lab Results  Component Value Date   WBC 6.3 03/05/2024   NEUTROABS 5.0 03/05/2024   HGB 15.8 03/05/2024   HCT 47.4 03/05/2024   MCV 88.9 03/05/2024   PLT 251 03/05/2024   STUDIES:   ASSESSMENT: Secondary polycythemia  PLAN:    1. 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  Patient last had phlebotomy in July 2021. Previously, it was agreed upon the goal hemoglobin will be between 17.0 and 17.5. Hemoglobin remains normal and no phlebotomy indicated today.  Patient expressed understanding that as long as he requires testosterone , he likely will require periodic phlebotomies however, given stability of his counts it is reasonable to release him from clinic. He will have his labs performed with pcp and if > 17, we can see him back in the future.    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 Continue to treatment with testosterone  per urology.  3.  Hypertension: Blood pressure is within normal limits today.  Disposition Prn- la  Patient expressed understanding and was in agreement with this plan. He also understands that He can call clinic at any time with any questions, concerns, or complaints.   Thank you for allowing me to participate in the care of this very pleasant patient.   Nelda Balsam, NP   03/05/2024

## 2024-03-05 NOTE — Telephone Encounter (Signed)
 Called patient and he is doing fine after yesterday's surgery, no pain. Sue Em., RMA

## 2024-03-05 NOTE — Progress Notes (Signed)
 Patient fatigue tired. He is still having leg and knee pain. He just had a spot on his back removed yesterday, he goes back to get the stitches out in a week or two. No new questions or concerns today.

## 2024-03-05 NOTE — Progress Notes (Signed)
No phlebotomy today.

## 2024-03-06 ENCOUNTER — Ambulatory Visit: Payer: Self-pay | Admitting: Dermatology

## 2024-03-06 LAB — SURGICAL PATHOLOGY

## 2024-03-09 NOTE — Telephone Encounter (Signed)
 Called patient to discuss pathology results and get update on surgical wound. N/A. LMOVM to return call if any questions or issues. KSA for suture removal 03/19/2024.

## 2024-03-09 NOTE — Telephone Encounter (Signed)
-----   Message from Ward sent at 03/06/2024  5:39 PM EDT ----- Diagnosis left mid back :       EPIDERMAL INCLUSION CYST, COMPLETELY EXCISED   Please call to share that excision fully removed benign cyst and get update on surgical wound. Thank you. ----- Message ----- From: Interface, Lab In [facility] Sent: 03/06/2024  11:52 AM EDT To: Boneta Sharps, MD

## 2024-03-17 ENCOUNTER — Other Ambulatory Visit: Payer: Self-pay | Admitting: Psychiatry

## 2024-03-17 DIAGNOSIS — F331 Major depressive disorder, recurrent, moderate: Secondary | ICD-10-CM

## 2024-03-19 ENCOUNTER — Ambulatory Visit: Admitting: Dermatology

## 2024-03-19 ENCOUNTER — Encounter: Payer: Self-pay | Admitting: Dermatology

## 2024-03-19 DIAGNOSIS — Z4802 Encounter for removal of sutures: Secondary | ICD-10-CM

## 2024-03-19 DIAGNOSIS — Z48817 Encounter for surgical aftercare following surgery on the skin and subcutaneous tissue: Secondary | ICD-10-CM

## 2024-03-19 DIAGNOSIS — Z5189 Encounter for other specified aftercare: Secondary | ICD-10-CM

## 2024-03-19 NOTE — Patient Instructions (Signed)

## 2024-03-19 NOTE — Progress Notes (Signed)
   Follow-Up Visit   Subjective  Kenneth Pruitt is a 73 y.o. male who presents for the following: bx proven cyst, f/u for suture removal   The following portions of the chart were reviewed this encounter and updated as appropriate: medications, allergies, medical history  Review of Systems:  No other skin or systemic complaints except as noted in HPI or Assessment and Plan.  Objective  Well appearing patient in no apparent distress; mood and affect are within normal limits.   A focused examination was performed of the following areas: back  Relevant exam findings are noted in the Assessment and Plan.    Assessment & Plan   EPIDERMAL INCLUSION CYST Bx proven L mid back Exam: healing excision site  Treatment Plan: Encounter for Removal of Sutures - Incision site at the L mid back is clean, dry and intact - Wound cleansed, sutures removed, wound cleansed and steri strips applied.  - Discussed pathology results showing Epidermal inclusion cyst  - Patient advised to keep steri-strips dry until they fall off. - Scars remodel for a full year. - Once steri-strips fall off, patient can apply over-the-counter silicone scar cream each night to help with scar remodeling if desired. - Patient advised to call with any concerns or if they notice any new or changing lesions.    VISIT FOR WOUND CHECK   ENCOUNTER FOR REMOVAL OF SUTURES    Return for as scheduled 09/29/24 for TBSE.  I, Grayce Saunas, RMA, am acting as scribe for Boneta Sharps, MD .   Documentation: I have reviewed the above documentation for accuracy and completeness, and I agree with the above.  Boneta Sharps, MD

## 2024-03-24 ENCOUNTER — Encounter: Payer: Self-pay | Admitting: Cardiovascular Disease

## 2024-03-24 ENCOUNTER — Ambulatory Visit: Admitting: Cardiovascular Disease

## 2024-03-24 VITALS — BP 122/70 | HR 86 | Ht 73.0 in | Wt 198.6 lb

## 2024-03-24 DIAGNOSIS — E782 Mixed hyperlipidemia: Secondary | ICD-10-CM | POA: Diagnosis not present

## 2024-03-24 DIAGNOSIS — I10 Essential (primary) hypertension: Secondary | ICD-10-CM

## 2024-03-24 DIAGNOSIS — I25110 Atherosclerotic heart disease of native coronary artery with unstable angina pectoris: Secondary | ICD-10-CM | POA: Diagnosis not present

## 2024-03-24 DIAGNOSIS — G4733 Obstructive sleep apnea (adult) (pediatric): Secondary | ICD-10-CM | POA: Diagnosis not present

## 2024-03-24 DIAGNOSIS — I2510 Atherosclerotic heart disease of native coronary artery without angina pectoris: Secondary | ICD-10-CM

## 2024-03-24 NOTE — Progress Notes (Signed)
 Cardiology Office Note   Date:  03/24/2024   ID:  Kenneth Pruitt 1951/04/06, MRN [id number]  PCP:  Vicci Duwaine SQUIBB, DO  Cardiologist:  Denyse Bathe, MD      History of Present Illness: Kenneth Pruitt is a 73 y.o. male who presents for  Chief Complaint  Patient presents with   Follow-up    3 month     Doing well, but had creat 1.52, and renal stopped hydralzine and meloxicam . But BP is normal.      Past Medical History:  Diagnosis Date   Arthritis    Coronary artery disease    Depression    Hyperlipidemia    Skin cancer    left forearm treated years ago by Dr. Oneil Rolling     Past Surgical History:  Procedure Laterality Date   ANKLE FRACTURE SURGERY Right    ARTHRODESIS ANT INTERBODY INC DISCECTOMY, CERVICAL BELOW C2    05/2019   CARDIAC CATHETERIZATION N/A 04/26/2015   Procedure: Left Heart Cath;  Surgeon: Denyse DELENA Bathe, MD;  Location: [facility] INVASIVE CV LAB;  Service: Cardiovascular;  Laterality: N/A;   CLAVICLE SURGERY Right 05/04/2021   CORONARY ANGIOPLASTY     CORONARY STENT INTERVENTION N/A 03/27/2018   Procedure: CORONARY STENT INTERVENTION;  Surgeon: Florencio Cara BIRCH, MD;  Location: [facility] INVASIVE CV LAB;  Service: Cardiovascular;  Laterality: N/A;   EYE SURGERY Bilateral    cataract   FOOT SURGERY Left 08/22/2022   FOOT SURGERY Right 10/31/2022   LEFT HEART CATH AND CORONARY ANGIOGRAPHY Left 03/27/2018   Procedure: Left Heart Cath with possible coronary intervention;  Surgeon: Bathe Denyse DELENA, MD;  Location[facility] INVASIVE CV LAB;  Service: Cardiovascular;  Laterality: Left;   LUMBAR FUSION  05/08/2022   TOTAL KNEE ARTHROPLASTY Right 10/08/2018   Procedure: TOTAL KNEE ARTHROPLASTY;  Surgeon: Leora Lynwood SAUNDERS, MD;  Location: [facility] ORS;  Service: Orthopedics;  Laterality: Right;     Current Outpatient Medications  Medication Sig Dispense Refill   acetaminophen  (TYLENOL ) 500 MG tablet Take by mouth.     aspirin  81 MG EC tablet Take 1 tablet  (81 mg total) by mouth daily. 90 tablet 3   buPROPion  (WELLBUTRIN  XL) 300 MG 24 hr tablet Take 1 tablet (300 mg total) by mouth in the morning. Stop the 150 mg , dose increase 90 tablet 1   chlorthalidone  (HYGROTON ) 25 MG tablet Take 1 tablet (25 mg total) by mouth daily. 90 tablet 1   Cholecalciferol  125 MCG (5000 UT) capsule Take 5,000 Units by mouth daily.     clobetasol (TEMOVATE) 0.05 % GEL Apply topically.     clopidogrel  (PLAVIX ) 75 MG tablet Take 1 tablet (75 mg total) by mouth daily. 90 tablet 1   clotrimazole -betamethasone  (LOTRISONE ) cream Apply 1 application topically 2 (two) times daily. 30 g 0   dexamethasone  (DECADRON ) 0.5 MG/5ML solution      diclofenac Sodium (VOLTAREN) 1 % GEL      DULoxetine  (CYMBALTA ) 20 MG capsule Take 20 mg daily along with 60 mg - total of 80 mg . 90 capsule 3   DULoxetine  (CYMBALTA ) 60 MG capsule TAKE 1 CAPSULE BY MOUTH DAILY ALONG WITHTHE 20MG  FOR A TOTAL OF 80MG  90 capsule 3   dutasteride  (AVODART ) 0.5 MG capsule TAKE 1 CAPSULE BY MOUTH EVERY DAY IN THEEVENING 90 capsule 1   fluticasone  (FLONASE ) 50 MCG/ACT nasal spray Place 2 sprays into both nostrils daily. 16 g 12   hydrocortisone  (ANUSOL -HC) 2.5 %  rectal cream APPLY RECTALLY TWICE DAILY AS DIRECTED 30 g 0   hydrOXYzine  (VISTARIL ) 25 MG capsule Take 1 capsule (25 mg total) by mouth 2 (two) times daily as needed for anxiety. 60 capsule 1   icosapent  Ethyl (VASCEPA ) 1 g capsule Take 2 capsules (2 g total) by mouth 2 (two) times daily. 360 capsule 1   losartan  (COZAAR ) 50 MG tablet Take 1 tablet (50 mg total) by mouth daily. 90 tablet 1   mupirocin  ointment (BACTROBAN ) 2 % Apply 1 Application topically daily. 22 g 0   niacin  (NIASPAN ) 1000 MG CR tablet Take 2 tablets (2,000 mg total) by mouth at bedtime. 180 tablet 2   pantoprazole  (PROTONIX ) 20 MG tablet Take 1 tablet (20 mg total) by mouth daily. 90 tablet 1   rosuvastatin  (CRESTOR ) 40 MG tablet Take 1 tablet (40 mg total) by mouth every evening. 90  tablet 1   spironolactone (ALDACTONE) 25 MG tablet Take 25 mg by mouth.     tamsulosin  (FLOMAX ) 0.4 MG CAPS capsule Take 1 capsule (0.4 mg total) by mouth daily. 90 capsule 1   testosterone  cypionate (DEPOTESTOSTERONE CYPIONATE) 200 MG/ML injection INJECT 0.75 ML INTRAMUSCULARLY EVERY 10 DAYS 10 mL 0   vitamin B-12 (CYANOCOBALAMIN ) 1000 MCG tablet Take 1,000 mcg by mouth daily.     No current facility-administered medications for this visit.    Allergies:   Clonidine  derivatives and Clonidine     Social History:   reports that he quit smoking about 17 years ago. His smoking use included cigars and cigarettes. He has never used smokeless tobacco. He reports that he does not drink alcohol and does not use drugs.   Family History:  family history includes Anxiety disorder in his father; Cancer in his father; Depression in his daughter and father; Diabetes in his mother; Hypertension in his mother.    ROS:     Review of Systems  Constitutional: Negative.   HENT: Negative.    Eyes: Negative.   Respiratory: Negative.    Gastrointestinal: Negative.   Genitourinary: Negative.   Musculoskeletal: Negative.   Skin: Negative.   Neurological: Negative.   Endo/Heme/Allergies: Negative.   Psychiatric/Behavioral: Negative.    All other systems reviewed and are negative.     All other systems are reviewed and negative.    PHYSICAL EXAM: VS:  BP 122/70   Pulse 86   Ht 6' 1 (1.854 m)   Wt 198 lb 9.6 oz (90.1 kg)   SpO2 98%   BMI 26.20 kg/m  , BMI Body mass index is 26.2 kg/m. Last weight:  Wt Readings from Last 3 Encounters:  03/24/24 198 lb 9.6 oz (90.1 kg)  03/05/24 200 lb (90.7 kg)  12/09/23 215 lb (97.5 kg)     Physical Exam Vitals reviewed.  Constitutional:      Appearance: Normal appearance. He is normal weight.  HENT:     Head: Normocephalic.     Nose: Nose normal.     Mouth/Throat:     Mouth: Mucous membranes are moist.  Eyes:     Pupils: Pupils are equal, round,  and reactive to light.  Cardiovascular:     Rate and Rhythm: Normal rate and regular rhythm.     Pulses: Normal pulses.     Heart sounds: Normal heart sounds.  Pulmonary:     Effort: Pulmonary effort is normal.  Abdominal:     General: Abdomen is flat. Bowel sounds are normal.  Musculoskeletal:  General: Normal range of motion.     Cervical back: Normal range of motion.  Skin:    General: Skin is warm.  Neurological:     General: No focal deficit present.     Mental Status: He is alert.  Psychiatric:        Mood and Affect: Mood normal.       EKG:   Recent Labs: 11/29/2023: ALT 21; TSH 1.320 01/09/2024: BUN 19; Creatinine, Ser 1.52; Potassium 3.3; Sodium 138 03/05/2024: Hemoglobin 15.8; Platelet Count 251    Lipid Panel    Component Value Date/Time   CHOL 87 (L) 11/29/2023 0930   CHOL 115 08/06/2016 0954   TRIG 130 11/29/2023 0930   TRIG 188 (H) 08/06/2016 0954   HDL 37 (L) 11/29/2023 0930   CHOLHDL 2.5 08/20/2019 0836   CHOLHDL 3.3 03/28/2018 0854   VLDL 37 03/28/2018 0854   VLDL 38 (H) 08/06/2016 0954   LDLCALC 27 11/29/2023 0930      Other studies Reviewed: Additional studies/ records that were reviewed today include:  Review of the above records demonstrates:       No data to display            ASSESSMENT AND PLAN:    ICD-10-CM   1. Coronary artery disease involving native coronary artery of native heart without angina pectoris  I25.10     2. OSA on CPAP  G47.33     3. Mixed hyperlipidemia  E78.2     4. Coronary artery disease with unstable angina pectoris, unspecified vessel or lesion type, unspecified whether native or transplanted heart (HCC)  I25.110     5. Essential hypertension  I10    off hydralzine, BP is normal. he is on chlorthalidone /aldactone which can raise creatinine.       Problem List Items Addressed This Visit       Cardiovascular and Mediastinum   Essential hypertension   CAD (coronary artery disease) - Primary      Respiratory   OSA on CPAP     Other   Hyperlipidemia       Disposition:   Return in about 3 months (around 06/24/2024).    Total time spent: 30 minutes  Signed,  Denyse Bathe, MD  03/24/2024 9:32 AM    [facility]

## 2024-03-26 ENCOUNTER — Other Ambulatory Visit: Payer: Self-pay | Admitting: Urology

## 2024-03-26 DIAGNOSIS — E291 Testicular hypofunction: Secondary | ICD-10-CM

## 2024-04-14 ENCOUNTER — Other Ambulatory Visit: Payer: Self-pay | Admitting: Psychiatry

## 2024-04-14 DIAGNOSIS — F411 Generalized anxiety disorder: Secondary | ICD-10-CM

## 2024-04-23 DIAGNOSIS — M961 Postlaminectomy syndrome, not elsewhere classified: Secondary | ICD-10-CM | POA: Diagnosis not present

## 2024-04-23 DIAGNOSIS — M545 Low back pain, unspecified: Secondary | ICD-10-CM | POA: Diagnosis not present

## 2024-04-23 DIAGNOSIS — Z981 Arthrodesis status: Secondary | ICD-10-CM | POA: Diagnosis not present

## 2024-04-23 DIAGNOSIS — M5416 Radiculopathy, lumbar region: Secondary | ICD-10-CM | POA: Diagnosis not present

## 2024-05-06 ENCOUNTER — Other Ambulatory Visit: Payer: Self-pay | Admitting: Family Medicine

## 2024-05-07 NOTE — Telephone Encounter (Signed)
 Requested Prescriptions  Pending Prescriptions Disp Refills   pantoprazole  (PROTONIX ) 20 MG tablet [Pharmacy Med Name: PANTOPRAZOLE  SODIUM 20 MG DR TAB] 90 tablet 0    Sig: TAKE 1 TABLET BY MOUTH ONCE DAILY     Gastroenterology: Proton Pump Inhibitors Passed - 05/07/2024  1:32 PM      Passed - Valid encounter within last 12 months    Recent Outpatient Visits           5 months ago Encounter for [employer] annual wellness exam   [place] [facility] [place], [place], DO   5 months ago [place]   [employer] [place], [place], DO   5 months ago [place]   [facility] Melvin Pao, NP       Future Appointments             In 1 month [name], Glendia BROCKS, MD [facility] [place]   In 1 month Fernand Denyse LABOR, MD [facility]   In 4 months Hester Alm BROCKS, MD [place] [facility]             chlorthalidone  (HYGROTON ) 25 MG tablet [Pharmacy Med Name: CHLORTHALIDONE  25 MG TAB] 90 tablet 0    Sig: TAKE 1 TABLET BY MOUTH ONCE DAILY     Cardiovascular: Diuretics - Thiazide Failed - 05/07/2024  1:32 PM      Failed - Cr in normal range and within 180 days    Creatinine, Ser  Date Value Ref Range Status  01/09/2024 1.52 (H) 0.76 - 1.27 mg/dL Final         Failed - K in normal range and within 180 days    Potassium  Date Value Ref Range Status  01/09/2024 3.3 (L) 3.5 - 5.2 mmol/L Final         Passed - Na in normal range and within 180 days    Sodium  Date Value Ref Range Status  01/09/2024 138 134 - 144 mmol/L Final         Passed - Last BP in normal range    BP Readings from Last 1 Encounters:  03/24/24 122/70         Passed - Valid encounter within last 6 months    Recent Outpatient Visits           5 months ago Encounter for [employer] annual wellness exam   [facility] [place], [place], DO   5 months ago [employer]   [facility] [place], [place], DO   5 months ago [place]   [facility] Melvin Pao, NP       Future Appointments             In 1 month [name], Glendia BROCKS, MD [facility] [place]   In 1 month Fernand Denyse LABOR, MD [facility]   In 4 months Hester Alm BROCKS, MD [facility]

## 2024-05-11 ENCOUNTER — Telehealth (INDEPENDENT_AMBULATORY_CARE_PROVIDER_SITE_OTHER): Payer: Self-pay | Admitting: Psychiatry

## 2024-05-11 DIAGNOSIS — F411 Generalized anxiety disorder: Secondary | ICD-10-CM

## 2024-05-11 DIAGNOSIS — F331 Major depressive disorder, recurrent, moderate: Secondary | ICD-10-CM

## 2024-05-11 NOTE — Progress Notes (Signed)
 No response to call or text or video invite

## 2024-05-13 DIAGNOSIS — M5416 Radiculopathy, lumbar region: Secondary | ICD-10-CM | POA: Diagnosis not present

## 2024-05-17 DIAGNOSIS — G4733 Obstructive sleep apnea (adult) (pediatric): Secondary | ICD-10-CM | POA: Diagnosis not present

## 2024-05-25 DIAGNOSIS — E785 Hyperlipidemia, unspecified: Secondary | ICD-10-CM | POA: Diagnosis not present

## 2024-05-25 DIAGNOSIS — R809 Proteinuria, unspecified: Secondary | ICD-10-CM | POA: Diagnosis not present

## 2024-05-25 DIAGNOSIS — N1831 Chronic kidney disease, stage 3a: Secondary | ICD-10-CM | POA: Diagnosis not present

## 2024-05-25 DIAGNOSIS — I10 Essential (primary) hypertension: Secondary | ICD-10-CM | POA: Diagnosis not present

## 2024-05-30 ENCOUNTER — Other Ambulatory Visit: Payer: Self-pay | Admitting: Psychiatry

## 2024-05-30 ENCOUNTER — Other Ambulatory Visit: Payer: Self-pay | Admitting: Family Medicine

## 2024-05-30 DIAGNOSIS — F411 Generalized anxiety disorder: Secondary | ICD-10-CM

## 2024-05-30 DIAGNOSIS — E782 Mixed hyperlipidemia: Secondary | ICD-10-CM

## 2024-06-01 ENCOUNTER — Telehealth: Payer: Self-pay | Admitting: Psychiatry

## 2024-06-01 NOTE — Telephone Encounter (Signed)
 I did not see an appointment scheduled for follow-up.

## 2024-06-01 NOTE — Telephone Encounter (Signed)
 Requested Prescriptions  Pending Prescriptions Disp Refills   rosuvastatin  (CRESTOR ) 40 MG tablet [Pharmacy Med Name: ROSUVASTATIN  CALCIUM  40 MG TAB] 90 tablet 01    Sig: TAKE 1 TABLET BY MOUTH EVERY EVENING     Cardiovascular:  Antilipid - Statins 2 Failed - 06/01/2024  4:28 PM      Failed - Cr in normal range and within 360 days    Creatinine, Ser  Date Value Ref Range Status  01/09/2024 1.52 (H) 0.76 - 1.27 mg/dL Final         Failed - Lipid Panel in normal range within the last 12 months    Cholesterol, Total  Date Value Ref Range Status  11/29/2023 87 (L) 100 - 199 mg/dL Final   Cholesterol Piccolo, Waived  Date Value Ref Range Status  08/06/2016 115 <200 mg/dL Final    Comment:                            Desirable                <200                         Borderline High      200- 239                         High                     >239    LDL Chol Calc (NIH)  Date Value Ref Range Status  11/29/2023 27 0 - 99 mg/dL Final   HDL  Date Value Ref Range Status  11/29/2023 37 (L) >39 mg/dL Final   Triglycerides  Date Value Ref Range Status  11/29/2023 130 0 - 149 mg/dL Final   Triglycerides Piccolo,Waived  Date Value Ref Range Status  08/06/2016 188 (H) <150 mg/dL Final    Comment:                            Normal                   <150                         Borderline High     150 - 199                         High                200 - 499                         Very High                >499          Passed - Patient is not pregnant      Passed - Valid encounter within last 12 months    Recent Outpatient Visits           6 months ago Encounter for [employer] annual wellness exam   [facility] [place], [name], DO   6 months ago [employer]   [facility], [name], DO   6 months ago [employer]  [facility] Melvin Pao, NP       Future Appointments             In 1 week  [name], Glendia BROCKS, MD [facility]   In 3 weeks Fernand Denyse LABOR, MD [facility]   In 4 months Hester Alm BROCKS, MD [facility]

## 2024-06-02 ENCOUNTER — Ambulatory Visit: Admitting: Family Medicine

## 2024-06-02 ENCOUNTER — Encounter: Payer: Self-pay | Admitting: Family Medicine

## 2024-06-02 VITALS — BP 125/75 | HR 76 | Temp 98.3°F | Ht 73.0 in | Wt 196.0 lb

## 2024-06-02 DIAGNOSIS — N183 Chronic kidney disease, stage 3 unspecified: Secondary | ICD-10-CM | POA: Diagnosis not present

## 2024-06-02 DIAGNOSIS — I2510 Atherosclerotic heart disease of native coronary artery without angina pectoris: Secondary | ICD-10-CM

## 2024-06-02 DIAGNOSIS — E782 Mixed hyperlipidemia: Secondary | ICD-10-CM | POA: Diagnosis not present

## 2024-06-02 DIAGNOSIS — R350 Frequency of micturition: Secondary | ICD-10-CM

## 2024-06-02 DIAGNOSIS — I10 Essential (primary) hypertension: Secondary | ICD-10-CM

## 2024-06-02 DIAGNOSIS — N401 Enlarged prostate with lower urinary tract symptoms: Secondary | ICD-10-CM

## 2024-06-02 DIAGNOSIS — Z23 Encounter for immunization: Secondary | ICD-10-CM | POA: Diagnosis not present

## 2024-06-02 DIAGNOSIS — R972 Elevated prostate specific antigen [PSA]: Secondary | ICD-10-CM | POA: Diagnosis not present

## 2024-06-02 DIAGNOSIS — D751 Secondary polycythemia: Secondary | ICD-10-CM | POA: Diagnosis not present

## 2024-06-02 DIAGNOSIS — F3341 Major depressive disorder, recurrent, in partial remission: Secondary | ICD-10-CM | POA: Diagnosis not present

## 2024-06-02 DIAGNOSIS — D692 Other nonthrombocytopenic purpura: Secondary | ICD-10-CM | POA: Diagnosis not present

## 2024-06-02 MED ORDER — LOSARTAN POTASSIUM 50 MG PO TABS: 50.0000 mg | ORAL_TABLET | Freq: Every day | ORAL | 1 refills | Status: AC

## 2024-06-02 MED ORDER — PANTOPRAZOLE SODIUM 20 MG PO TBEC: 20.0000 mg | DELAYED_RELEASE_TABLET | Freq: Every day | ORAL | 1 refills | Status: AC

## 2024-06-02 MED ORDER — SPIRONOLACTONE 25 MG PO TABS: 25.0000 mg | ORAL_TABLET | Freq: Every day | ORAL | 1 refills | Status: AC

## 2024-06-02 MED ORDER — NIACIN ER (ANTIHYPERLIPIDEMIC) 1000 MG PO TBCR: 2000.0000 mg | EXTENDED_RELEASE_TABLET | Freq: Every day | ORAL | 2 refills | Status: AC

## 2024-06-02 MED ORDER — CHLORTHALIDONE 25 MG PO TABS: 25.0000 mg | ORAL_TABLET | Freq: Every day | ORAL | 0 refills | Status: AC

## 2024-06-02 MED ORDER — ROSUVASTATIN CALCIUM 40 MG PO TABS: 40.0000 mg | ORAL_TABLET | Freq: Every evening | ORAL | 1 refills | Status: AC

## 2024-06-02 MED ORDER — CLOPIDOGREL BISULFATE 75 MG PO TABS: 75.0000 mg | ORAL_TABLET | Freq: Every day | ORAL | 1 refills | Status: AC

## 2024-06-02 MED ORDER — TAMSULOSIN HCL 0.4 MG PO CAPS
0.4000 mg | ORAL_CAPSULE | Freq: Every day | ORAL | 1 refills | Status: AC
Start: 2024-06-02 — End: ?

## 2024-06-02 MED ORDER — DUTASTERIDE 0.5 MG PO CAPS: ORAL_CAPSULE | ORAL | 1 refills | Status: DC

## 2024-06-02 MED ORDER — ICOSAPENT ETHYL 1 G PO CAPS: 2.0000 g | ORAL_CAPSULE | Freq: Two times a day (BID) | ORAL | 1 refills | Status: AC

## 2024-06-02 NOTE — Assessment & Plan Note (Signed)
 Checked last week and stable. Continue to monitor. Call with any concerns.

## 2024-06-02 NOTE — Assessment & Plan Note (Signed)
 Reassured patient. Continue to monitor.

## 2024-06-02 NOTE — Assessment & Plan Note (Signed)
 Under good control on current regimen. Continue current regimen. Continue to monitor. Call with any concerns. Refills given. Labs drawn today.

## 2024-06-02 NOTE — Assessment & Plan Note (Signed)
 Rechecking labs today. Await results. Treat as needed.

## 2024-06-02 NOTE — Progress Notes (Signed)
 BP 125/75 (BP Location: Left Arm, Patient Position: Sitting, Cuff Size: Large)   Pulse 76   Temp 98.3 F (36.8 C)   Ht 6' 1 (1.854 m)   Wt 196 lb (88.9 kg)   SpO2 98%   BMI 25.86 kg/m    Subjective:    Patient ID: Kenneth Pruitt, male    DOB: 01-26-1951, 73 y.o.   MRN: [id number]  HPI: ADMIRAL MARCUCCI is a 73 y.o. male  Chief Complaint  Patient presents with   Hypertension   Hyperlipidemia   HYPERTENSION / HYPERLIPIDEMIA Satisfied with current treatment? yes Duration of hypertension: chronic BP monitoring frequency: not checking BP medication side effects: no Past BP meds: chlorthalidone , losartan , spironalactone Duration of hyperlipidemia: chronic Cholesterol medication side effects: no Cholesterol supplements: none Past cholesterol medications: crestor , niacin , lovaza  Medication compliance: excellent compliance Aspirin : no Recent stressors: no Recurrent headaches: no Visual changes: no Palpitations: no Dyspnea: no Chest pain: no Lower extremity edema: no Dizzy/lightheaded: no  BPH BPH status: controlled Satisfied with current treatment?: yes Medication side effects: no Medication compliance: excellent compliance Duration: chronic Nocturia: 1/night Urinary frequency:no Incomplete voiding: no Urgency: no Weak urinary stream: no Straining to start stream: no Dysuria: no Onset: gradual Severity: mild   Relevant past medical, surgical, family and social history reviewed and updated as indicated. Interim medical history since our last visit reviewed. Allergies and medications reviewed and updated.  Review of Systems  Constitutional: Negative.   Respiratory: Negative.    Cardiovascular: Negative.   Musculoskeletal:  Positive for arthralgias, back pain and myalgias. Negative for gait problem, joint swelling, neck pain and neck stiffness.  Neurological: Negative.   Psychiatric/Behavioral: Negative.      Per HPI unless specifically indicated  above     Objective:    BP 125/75 (BP Location: Left Arm, Patient Position: Sitting, Cuff Size: Large)   Pulse 76   Temp 98.3 F (36.8 C)   Ht 6' 1 (1.854 m)   Wt 196 lb (88.9 kg)   SpO2 98%   BMI 25.86 kg/m   Wt Readings from Last 3 Encounters:  06/02/24 196 lb (88.9 kg)  03/24/24 198 lb 9.6 oz (90.1 kg)  03/05/24 200 lb (90.7 kg)    Physical Exam Vitals and nursing note reviewed.  Constitutional:      General: He is not in acute distress.    Appearance: Normal appearance. He is normal weight. He is not ill-appearing, toxic-appearing or diaphoretic.  HENT:     Head: Normocephalic and atraumatic.     Right Ear: External ear normal.     Left Ear: External ear normal.     Nose: Nose normal.     Mouth/Throat:     Mouth: Mucous membranes are moist.     Pharynx: Oropharynx is clear.  Eyes:     General: No scleral icterus.       Right eye: No discharge.        Left eye: No discharge.     Extraocular Movements: Extraocular movements intact.     Conjunctiva/sclera: Conjunctivae normal.     Pupils: Pupils are equal, round, and reactive to light.  Cardiovascular:     Rate and Rhythm: Normal rate and regular rhythm.     Pulses: Normal pulses.     Heart sounds: Normal heart sounds. No murmur heard.    No friction rub. No gallop.  Pulmonary:     Effort: Pulmonary effort is normal. No respiratory distress.     Breath  sounds: Normal breath sounds. No stridor. No wheezing, rhonchi or rales.  Chest:     Chest wall: No tenderness.  Musculoskeletal:        General: Normal range of motion.     Cervical back: Normal range of motion and neck supple.  Skin:    General: Skin is warm and dry.     Capillary Refill: Capillary refill takes less than 2 seconds.     Coloration: Skin is not jaundiced or pale.     Findings: No bruising, erythema, lesion or rash.  Neurological:     General: No focal deficit present.     Mental Status: He is alert and oriented to person, place, and time.  Mental status is at baseline.  Psychiatric:        Mood and Affect: Mood normal.        Behavior: Behavior normal.        Thought Content: Thought content normal.        Judgment: Judgment normal.     Results for orders placed or performed in visit on 03/05/24  CBC with Differential (Cancer Center Only)   Collection Time: 03/05/24  1:03 PM  Result Value Ref Range   WBC Count 6.3 4.0 - 10.5 K/uL   RBC 5.33 4.22 - 5.81 MIL/uL   Hemoglobin 15.8 13.0 - 17.0 g/dL   HCT 52.5 60.9 - 47.9 %   MCV 88.9 80.0 - 100.0 fL   MCH 29.6 26.0 - 34.0 pg   MCHC 33.3 30.0 - 36.0 g/dL   RDW 85.1 88.4 - 84.4 %   Platelet Count 251 150 - 400 K/uL   nRBC 0.0 0.0 - 0.2 %   Neutrophils Relative % 80 %   Neutro Abs 5.0 1.7 - 7.7 K/uL   Lymphocytes Relative 12 %   Lymphs Abs 0.7 0.7 - 4.0 K/uL   Monocytes Relative 7 %   Monocytes Absolute 0.5 0.1 - 1.0 K/uL   Eosinophils Relative 0 %   Eosinophils Absolute 0.0 0.0 - 0.5 K/uL   Basophils Relative 1 %   Basophils Absolute 0.0 0.0 - 0.1 K/uL   Immature Granulocytes 0 %   Abs Immature Granulocytes 0.02 0.00 - 0.07 K/uL      Assessment & Plan:   Problem List Items Addressed This Visit       Cardiovascular and Mediastinum   Essential hypertension   Under good control on current regimen. Continue current regimen. Continue to monitor. Call with any concerns. Refills given. Labs drawn today.       Relevant Medications   chlorthalidone  (HYGROTON ) 25 MG tablet   icosapent  Ethyl (VASCEPA ) 1 g capsule   losartan  (COZAAR ) 50 MG tablet   niacin  (NIASPAN ) 1000 MG CR tablet   rosuvastatin  (CRESTOR ) 40 MG tablet   spironolactone  (ALDACTONE ) 25 MG tablet   CAD (coronary artery disease)   Will keep BP and cholesterol under good control. Continue to monitor. Call with any concerns.       Relevant Medications   chlorthalidone  (HYGROTON ) 25 MG tablet   icosapent  Ethyl (VASCEPA ) 1 g capsule   losartan  (COZAAR ) 50 MG tablet   niacin  (NIASPAN ) 1000 MG CR  tablet   rosuvastatin  (CRESTOR ) 40 MG tablet   spironolactone  (ALDACTONE ) 25 MG tablet   Senile purpura (HCC)   Reassured patient. Continue to monitor.       Relevant Medications   chlorthalidone  (HYGROTON ) 25 MG tablet   icosapent  Ethyl (VASCEPA ) 1 g capsule   losartan  (COZAAR )  50 MG tablet   niacin  (NIASPAN ) 1000 MG CR tablet   rosuvastatin  (CRESTOR ) 40 MG tablet   spironolactone  (ALDACTONE ) 25 MG tablet     Genitourinary   BPH (benign prostatic hyperplasia)   Under good control on current regimen. Continue current regimen. Continue to monitor. Call with any concerns. Refills given. Labs drawn today.        Relevant Medications   dutasteride  (AVODART ) 0.5 MG capsule   tamsulosin  (FLOMAX ) 0.4 MG CAPS capsule   Chronic kidney disease (CKD), stage III (moderate) (HCC)   Seeing nephrology. Doing well. Continue to monitor.         Other   Hyperlipidemia - Primary   Under good control on current regimen. Continue current regimen. Continue to monitor. Call with any concerns. Refills given. Labs drawn today.       Relevant Medications   chlorthalidone  (HYGROTON ) 25 MG tablet   icosapent  Ethyl (VASCEPA ) 1 g capsule   losartan  (COZAAR ) 50 MG tablet   niacin  (NIASPAN ) 1000 MG CR tablet   rosuvastatin  (CRESTOR ) 40 MG tablet   spironolactone  (ALDACTONE ) 25 MG tablet   Other Relevant Orders   Lipid Panel w/o Chol/HDL Ratio   Hepatic function panel   Elevated PSA   Rechecking labs today. Await results. Treat as needed.       Relevant Medications   tamsulosin  (FLOMAX ) 0.4 MG CAPS capsule   Other Relevant Orders   PSA   Polycythemia, secondary   Checked last week and stable. Continue to monitor. Call with any concerns.       Recurrent major depressive disorder, in partial remission (HCC)   Continue to follow with psychiatry. Call with any concerns.       Other Visit Diagnoses       Needs flu shot       Flu shot given today.   Relevant Orders   Flu vaccine HIGH DOSE  PF(Fluzone Trivalent) (Completed)     Need for COVID-19 vaccine       Covid shot given today.   Relevant Orders   Pfizer Comirnaty Covid -19 Vaccine 38yrs and older (Completed)        Follow up plan: Return in about 6 months (around 11/30/2024) for physical.

## 2024-06-02 NOTE — Assessment & Plan Note (Signed)
 Will keep BP and cholesterol under good control. Continue to monitor. Call with any concerns.

## 2024-06-02 NOTE — Assessment & Plan Note (Signed)
Continue to follow with psychiatry. Call with any concerns.  ?

## 2024-06-02 NOTE — Assessment & Plan Note (Signed)
 Seeing nephrology. Doing well. Continue to monitor.

## 2024-06-03 ENCOUNTER — Other Ambulatory Visit: Payer: Self-pay

## 2024-06-03 ENCOUNTER — Encounter: Payer: Self-pay | Admitting: Psychiatry

## 2024-06-03 ENCOUNTER — Ambulatory Visit (INDEPENDENT_AMBULATORY_CARE_PROVIDER_SITE_OTHER): Admitting: Psychiatry

## 2024-06-03 ENCOUNTER — Ambulatory Visit: Payer: Self-pay | Admitting: Family Medicine

## 2024-06-03 VITALS — BP 126/76 | HR 80 | Temp 97.4°F | Ht 73.0 in | Wt 197.6 lb

## 2024-06-03 DIAGNOSIS — F3342 Major depressive disorder, recurrent, in full remission: Secondary | ICD-10-CM | POA: Diagnosis not present

## 2024-06-03 DIAGNOSIS — G4701 Insomnia due to medical condition: Secondary | ICD-10-CM | POA: Diagnosis not present

## 2024-06-03 DIAGNOSIS — F411 Generalized anxiety disorder: Secondary | ICD-10-CM

## 2024-06-03 LAB — HEPATIC FUNCTION PANEL
ALT: 21 IU/L (ref 0–44)
AST: 20 IU/L (ref 0–40)
Albumin: 4.6 g/dL (ref 3.8–4.8)
Alkaline Phosphatase: 57 IU/L (ref 47–123)
Bilirubin Total: 0.6 mg/dL (ref 0.0–1.2)
Bilirubin, Direct: 0.24 mg/dL (ref 0.00–0.40)
Total Protein: 6.3 g/dL (ref 6.0–8.5)

## 2024-06-03 LAB — PSA: Prostate Specific Ag, Serum: 4.3 ng/mL — ABNORMAL HIGH (ref 0.0–4.0)

## 2024-06-03 LAB — LIPID PANEL W/O CHOL/HDL RATIO
Cholesterol, Total: 90 mg/dL — ABNORMAL LOW (ref 100–199)
HDL: 38 mg/dL — ABNORMAL LOW (ref >39)
LDL Chol Calc (NIH): 32 mg/dL (ref 0–99)
Triglycerides: 103 mg/dL (ref 0–149)
VLDL Cholesterol Cal: 20 mg/dL (ref 5–40)

## 2024-06-03 NOTE — Progress Notes (Unsigned)
 [facility] MD OP Progress Note  06/03/2024 2:02 PM Kenneth Pruitt  MRN:  [id number]  Chief Complaint:  Chief Complaint  Patient presents with   Follow-up   Anxiety   Depression   Medication Refill   Discussed the use of AI scribe software for clinical note transcription with the patient, who gave verbal consent to proceed.  History of Present Illness Kenneth Pruitt is a 73 year old Caucasian male, retired, married, lives in [place], has a history of MDD, GAD, insomnia, coronary artery disease status post stent placement, knee pain, multiple surgeries, testosterone  deficiency presents for a follow-up appointment.  He reports feeling generally stable on his current medications, which include duloxetine  80 mg daily (taken as 60 mg and 20 mg) and bupropion  300 mg daily. These medications work well for him, and he does not report any current issues related to his medication regimen. He also uses hydroxyzine  as needed for anxiety.  At times, he experiences some low mood, which he relates to ongoing stressors at home, particularly his wife's significant health issues. He sometimes feels down but considers this normal and not severe. He denies significant depressive symptoms and describes feeling very stable overall. As an introvert, he emphasizes the importance of daily me time to recharge, which he achieves by listening to music for several hours a day and practicing his hobbies. These activities help him manage stress and maintain his well-being.  He reports ongoing psychosocial stressors related to his wife's chronic health problems, including her pain and recent cancer treatment. He notes that his wife experiences significant anxiety and worry, and he tries to support her while maintaining his own self-care routines. Engaging in physical activity, such as cycling, and spending time on hobbies serve as important coping mechanisms for him.  He denies any thoughts of self-harm or significant  depression.    Visit Diagnosis:    ICD-10-CM   1. Recurrent major depressive disorder, in full remission (HCC)  F33.42     2. GAD (generalized anxiety disorder)  F41.1     3. Insomnia due to medical condition  G47.01    Depression, OSA on CPAP      Past Psychiatric History: I have reviewed past psychiatric history from progress note on 12/16/2017.  Past Medical History:  Past Medical History:  Diagnosis Date   Arthritis    Coronary artery disease    Depression    Hyperlipidemia    Skin cancer    left forearm treated years ago by Dr. Oneil Rolling    Past Surgical History:  Procedure Laterality Date   ANKLE FRACTURE SURGERY Right    ARTHRODESIS ANT INTERBODY INC DISCECTOMY, CERVICAL BELOW C2    05/2019   CARDIAC CATHETERIZATION N/A 04/26/2015   Procedure: Left Heart Cath;  Surgeon: Denyse DELENA Bathe, MD;  Location: [facility] INVASIVE CV LAB;  Service: Cardiovascular;  Laterality: N/A;   CLAVICLE SURGERY Right 05/04/2021   CORONARY ANGIOPLASTY     CORONARY STENT INTERVENTION N/A 03/27/2018   Procedure: CORONARY STENT INTERVENTION;  Surgeon: Florencio Cara BIRCH, MD;  Location: [facility] INVASIVE CV LAB;  Service: Cardiovascular;  Laterality: N/A;   EYE SURGERY Bilateral    cataract   FOOT SURGERY Left 08/22/2022   FOOT SURGERY Right 10/31/2022   LEFT HEART CATH AND CORONARY ANGIOGRAPHY Left 03/27/2018   Procedure: Left Heart Cath with possible coronary intervention;  Surgeon: Bathe Denyse DELENA, MD;  Location: [facility] INVASIVE CV LAB;  Service: Cardiovascular;  Laterality: Left;   LUMBAR FUSION  05/08/2022  TOTAL KNEE ARTHROPLASTY Right 10/08/2018   Procedure: TOTAL KNEE ARTHROPLASTY;  Surgeon: Leora Lynwood SAUNDERS, MD;  Location: [facility] ORS;  Service: Orthopedics;  Laterality: Right;    Family Psychiatric History: I have reviewed family psychiatric history from progress note on 12/16/2017.  Family History:  Family History  Problem Relation Age of Onset   Diabetes Mother    Hypertension Mother     Cancer Father    Depression Father    Anxiety disorder Father    Depression Daughter     Social History: I have reviewed social history from progress note on 12/16/2017. Social History   Socioeconomic History   Marital status: Married    Spouse name: ann   Number of children: 2   Years of education: Not on file   Highest education level: Master's degree (e.g., MA, MS, MEng, MEd, MSW, MBA)  Occupational History    Comment: retired  Tobacco Use   Smoking status: Former    Types: Cigars, Cigarettes    Quit date: 07/18/2006    Years since quitting: 17.8   Smokeless tobacco: Never  Vaping Use   Vaping status: Never Used  Substance and Sexual Activity   Alcohol use: No   Drug use: No   Sexual activity: Not Currently  Other Topics Concern   Not on file  Social History Narrative   Not on file   Social Drivers of Health   Financial Resource Strain: Low Risk  (05/30/2024)   Overall Financial Resource Strain (CARDIA)    Difficulty of Paying Living Expenses: Not hard at all  Food Insecurity: No Food Insecurity (05/30/2024)   Hunger Vital Sign    Worried About Running Out of Food in the Last Year: Never true    Ran Out of Food in the Last Year: Never true  Transportation Needs: No Transportation Needs (05/30/2024)   PRAPARE - Administrator, Civil Service (Medical): No    Lack of Transportation (Non-Medical): No  Physical Activity: Sufficiently Active (05/30/2024)   Exercise Vital Sign    Days of Exercise per Week: 5 days    Minutes of Exercise per Session: 150+ min  Stress: Stress Concern Present (05/30/2024)   [employer] of Occupational Health - Occupational Stress Questionnaire    Feeling of Stress: Rather much  Social Connections: Socially Integrated (05/30/2024)   Social Connection and Isolation Panel    Frequency of Communication with Friends and Family: Twice a week    Frequency of Social Gatherings with Friends and Family: Once a week    Attends  Religious Services: More than 4 times per year    Active Member of [employer] or Organizations: Yes    Attends Engineer, structural: More than 4 times per year    Marital Status: Married    Allergies:  Allergies  Allergen Reactions   Clonidine  Derivatives Other (See Comments) and Shortness Of Breath    Low heart rate  Low heart rate    Mild bradycardia + Somnolence    Low heart rate Mild bradycardia + Somnolence Mild bradycardia + Somnolence    Low heart rate Mild bradycardia + Somnolence Mild bradycardia + Somnolence Mild bradycardia + Somnolence Mild bradycardia + Somnolence  clonidine    Clonidine      Mild bradycardia + Somnolence    Metabolic Disorder Labs: Lab Results  Component Value Date   HGBA1C 6.1 (H) 05/01/2022   No results found for: PROLACTIN Lab Results  Component Value Date   CHOL 90 (L) 06/02/2024  TRIG 103 06/02/2024   HDL 38 (L) 06/02/2024   CHOLHDL 2.5 08/20/2019   VLDL 37 03/28/2018   LDLCALC 32 06/02/2024   LDLCALC 27 11/29/2023   Lab Results  Component Value Date   TSH 1.320 11/29/2023   TSH 1.120 11/26/2022    Therapeutic Level Labs: No results found for: LITHIUM No results found for: VALPROATE No results found for: CBMZ  Current Medications: Current Outpatient Medications  Medication Sig Dispense Refill   acetaminophen  (TYLENOL ) 500 MG tablet Take by mouth.     aspirin  81 MG EC tablet Take 1 tablet (81 mg total) by mouth daily. 90 tablet 3   buPROPion  (WELLBUTRIN  XL) 300 MG 24 hr tablet Take 1 tablet (300 mg total) by mouth in the morning. Stop the 150 mg , dose increase 90 tablet 1   chlorthalidone  (HYGROTON ) 25 MG tablet Take 1 tablet (25 mg total) by mouth daily. 90 tablet 0   Cholecalciferol  125 MCG (5000 UT) capsule Take 5,000 Units by mouth daily.     clobetasol (TEMOVATE) 0.05 % GEL Apply topically.     clopidogrel  (PLAVIX ) 75 MG tablet Take 1 tablet (75 mg total) by mouth daily. 90 tablet 1    clotrimazole -betamethasone  (LOTRISONE ) cream Apply 1 application topically 2 (two) times daily. 30 g 0   dexamethasone  (DECADRON ) 0.5 MG/5ML solution      diclofenac Sodium (VOLTAREN) 1 % GEL      DULoxetine  (CYMBALTA ) 20 MG capsule TAKE 1 CAPSULE BY MOUTH EVERY DAY ALONG WITH 60MG  FOR TOTAL OF 80MG  90 capsule 3   DULoxetine  (CYMBALTA ) 60 MG capsule TAKE 1 CAPSULE BY MOUTH DAILY ALONG WITHTHE 20MG  FOR A TOTAL OF 80MG  90 capsule 3   dutasteride  (AVODART ) 0.5 MG capsule TAKE 1 CAPSULE BY MOUTH EVERY DAY IN THEEVENING 90 capsule 1   fluticasone  (FLONASE ) 50 MCG/ACT nasal spray Place 2 sprays into both nostrils daily. 16 g 12   hydrocortisone  (ANUSOL -HC) 2.5 % rectal cream APPLY RECTALLY TWICE DAILY AS DIRECTED 30 g 0   hydrOXYzine  (VISTARIL ) 25 MG capsule Take 1 capsule (25 mg total) by mouth 2 (two) times daily as needed for anxiety. 60 capsule 1   icosapent  Ethyl (VASCEPA ) 1 g capsule Take 2 capsules (2 g total) by mouth 2 (two) times daily. 360 capsule 1   losartan  (COZAAR ) 50 MG tablet Take 1 tablet (50 mg total) by mouth daily. 90 tablet 1   mupirocin  ointment (BACTROBAN ) 2 % Apply 1 Application topically daily. 22 g 0   niacin  (NIASPAN ) 1000 MG CR tablet Take 2 tablets (2,000 mg total) by mouth at bedtime. 180 tablet 2   pantoprazole  (PROTONIX ) 20 MG tablet Take 1 tablet (20 mg total) by mouth daily. 90 tablet 1   rosuvastatin  (CRESTOR ) 40 MG tablet Take 1 tablet (40 mg total) by mouth every evening. 90 tablet 1   spironolactone  (ALDACTONE ) 25 MG tablet Take 1 tablet (25 mg total) by mouth daily. 90 tablet 1   tamsulosin  (FLOMAX ) 0.4 MG CAPS capsule Take 1 capsule (0.4 mg total) by mouth daily. 90 capsule 1   testosterone  cypionate (DEPOTESTOSTERONE CYPIONATE) 200 MG/ML injection INJECT 0.75 ML INTRAMUSCULARLY EVERY 10 DAYS 10 mL 0   vitamin B-12 (CYANOCOBALAMIN ) 1000 MCG tablet Take 1,000 mcg by mouth daily.     No current facility-administered medications for this visit.      Musculoskeletal: Strength & Muscle Tone: within normal limits Gait & Station: slow Patient leans: N/A  Psychiatric Specialty Exam: Review of Systems  Psychiatric/Behavioral:  The patient is nervous/anxious.     Blood pressure 126/76, pulse 80, temperature (!) 97.4 F (36.3 C), temperature source Temporal, height 6' 1 (1.854 m), weight 197 lb 9.6 oz (89.6 kg).Body mass index is 26.07 kg/m.  General Appearance: Casual  Eye Contact:  Fair  Speech:  Clear and Coherent  Volume:  Normal  Mood:  Anxious  Affect:  Non-Congruent  Thought Process:  Goal Directed and Descriptions of Associations: Intact  Orientation:  Full (Time, Place, and Person)  Thought Content: Logical   Suicidal Thoughts:  No  Homicidal Thoughts:  No  Memory:  Immediate;   Fair Recent;   Fair Remote;   Fair  Judgement:  Fair  Insight:  Fair  Psychomotor Activity:  Normal  Concentration:  Concentration: Fair and Attention Span: Fair  Recall:  [employer] of Knowledge: Fair  Language: Fair  Akathisia:  No  Handed:  Right  AIMS (if indicated): not done  Assets:  Manufacturing systems engineer Desire for Improvement Housing Social Support Transportation  ADL's:  Intact  Cognition: WNL  Sleep:  Fair   Screenings: Geneticist, molecular Office Visit from 04/09/2022 in [facility] Office Visit from 02/06/2022 in [facility]  AIMS Total Score 0 0   GAD-7    Flowsheet Row Office Visit from 06/03/2024 in [facility] Office Visit from 06/02/2024 in [facility] Office Visit from 12/30/2023 in [facility] Office Visit from 11/28/2023 in [facility] Office Visit from 11/21/2023 in [facility]  Total GAD-7 Score 8 9 12 7 5    PHQ2-9    Flowsheet Row Office Visit from 06/03/2024 in [facility] Office Visit from 06/02/2024 in [facility] Office Visit from 12/30/2023 in [facility] Office Visit from 11/28/2023 in [facility] Office Visit from 11/21/2023 in [facility]  PHQ-2 Total Score 2 2 4 2 2   PHQ-9 Total Score 9 9 13 7 10    Flowsheet Row Video Visit from 02/11/2024 in [facility] Office Visit from 12/30/2023 in [facility] Office Visit from 07/01/2023 in [facility]  C-SSRS RISK CATEGORY No Risk No Risk No Risk     Assessment and Plan: DEZMEN ALCOCK is a 73 year old Caucasian male who has a history of MDD, GAD, multiple medical problems was evaluated in office today.  Discussed assessment and plan as noted below.  1. Recurrent major depressive disorder, in full remission [facility]) Currently denies any significant depression symptoms well-managed on the current medication regimen. Continue Wellbutrin  300 mg daily Continue Cymbalta  80 mg daily  2. GAD (generalized anxiety disorder)-stable Although does have anxiety which is mostly situational overall managing well and has been making use of coping strategies. Continue Cymbalta  80 mg daily Continue Hydroxyzine  25 mg twice a day as needed  3. Insomnia due to medical condition-stable Currently reports sleep is good. Continue CPAP therapy for obstructive sleep apnea Continue sleep hygiene techniques  Follow-up Follow-up in clinic in 4 months or sooner if needed.   Consent: Patient/Guardian gives verbal consent for treatment and assignment of benefits for services provided during this visit. Patient/Guardian expressed understanding and agreed to proceed.  This note was generated in part or whole with  voice recognition software. Voice recognition is usually  quite accurate but there are transcription errors that can and very often do occur. I apologize for any typographical errors that were not detected and corrected.     Dominque Marlin, MD 06/03/2024, 2:02 PM

## 2024-06-04 DIAGNOSIS — E785 Hyperlipidemia, unspecified: Secondary | ICD-10-CM | POA: Diagnosis not present

## 2024-06-04 DIAGNOSIS — R809 Proteinuria, unspecified: Secondary | ICD-10-CM | POA: Diagnosis not present

## 2024-06-04 DIAGNOSIS — I10 Essential (primary) hypertension: Secondary | ICD-10-CM | POA: Diagnosis not present

## 2024-06-04 DIAGNOSIS — N1831 Chronic kidney disease, stage 3a: Secondary | ICD-10-CM | POA: Diagnosis not present

## 2024-06-08 ENCOUNTER — Encounter: Payer: Self-pay | Admitting: Oncology

## 2024-06-08 ENCOUNTER — Other Ambulatory Visit (HOSPITAL_COMMUNITY): Payer: Self-pay

## 2024-06-10 ENCOUNTER — Other Ambulatory Visit: Payer: Self-pay

## 2024-06-10 DIAGNOSIS — N401 Enlarged prostate with lower urinary tract symptoms: Secondary | ICD-10-CM

## 2024-06-11 ENCOUNTER — Other Ambulatory Visit: Payer: Self-pay

## 2024-06-11 DIAGNOSIS — N401 Enlarged prostate with lower urinary tract symptoms: Secondary | ICD-10-CM | POA: Diagnosis not present

## 2024-06-11 DIAGNOSIS — E291 Testicular hypofunction: Secondary | ICD-10-CM | POA: Diagnosis not present

## 2024-06-12 ENCOUNTER — Ambulatory Visit: Payer: Self-pay | Admitting: Urology

## 2024-06-12 ENCOUNTER — Encounter: Payer: Self-pay | Admitting: Urology

## 2024-06-12 VITALS — BP 126/75 | HR 63 | Ht 73.0 in | Wt 195.0 lb

## 2024-06-12 DIAGNOSIS — N401 Enlarged prostate with lower urinary tract symptoms: Secondary | ICD-10-CM

## 2024-06-12 DIAGNOSIS — E291 Testicular hypofunction: Secondary | ICD-10-CM | POA: Diagnosis not present

## 2024-06-12 LAB — PSA: Prostate Specific Ag, Serum: 3.6 ng/mL (ref 0.0–4.0)

## 2024-06-12 LAB — HEMOGLOBIN AND HEMATOCRIT, BLOOD
Hematocrit: 50.5 % (ref 37.5–51.0)
Hemoglobin: 16.7 g/dL (ref 13.0–17.7)

## 2024-06-12 LAB — TESTOSTERONE: Testosterone: 268 ng/dL (ref 264–916)

## 2024-06-12 NOTE — Progress Notes (Signed)
 06/12/2024 8:39 AM   Kenneth Pruitt [birth year] [id number]  Referring provider: Vicci Duwaine SQUIBB, DO [address] [place],  [place] [address]  Chief Complaint  Patient presents with   Hypogonadism    Urologic History:   1. Hypogonadism Symptoms: tiredness, fatigue, depressed mood. Testosterone  cypionate Followed by hematology for erythrocytosis.   2. BPH with LUTS Tamsulosin /Finasteride  prescribed by PCP.   HPI: Kenneth Pruitt is a 73 y.o. male  Denies dysuria, gross hematuria No flank, abdominal or pelvic pain Stable since last year's visit. Remains on Tamsulosin /Dutasteride . Labs 06/11/24: Testosterone  268 ng/dL; PSA 3.6; Y/Y83.2/49.4.  Last testosterone  injection prior to this blood draw was 05/24/2024  PMH: Past Medical History:  Diagnosis Date   Arthritis    Coronary artery disease    Depression    Hyperlipidemia    Skin cancer    left forearm treated years ago by Dr. Oneil Rolling    Surgical History: Past Surgical History:  Procedure Laterality Date   ANKLE FRACTURE SURGERY Right    ARTHRODESIS ANT INTERBODY INC DISCECTOMY, CERVICAL BELOW C2    05/2019   CARDIAC CATHETERIZATION N/A 04/26/2015   Procedure: Left Heart Cath;  Surgeon: Denyse DELENA Bathe, MD;  Location: [facility] INVASIVE CV LAB;  Service: Cardiovascular;  Laterality: N/A;   CLAVICLE SURGERY Right 05/04/2021   CORONARY ANGIOPLASTY     CORONARY STENT INTERVENTION N/A 03/27/2018   Procedure: CORONARY STENT INTERVENTION;  Surgeon: Florencio Cara BIRCH, MD;  Location: [facility] INVASIVE CV LAB;  Service: Cardiovascular;  Laterality: N/A;   EYE SURGERY Bilateral    cataract   FOOT SURGERY Left 08/22/2022   FOOT SURGERY Right 10/31/2022   LEFT HEART CATH AND CORONARY ANGIOGRAPHY Left 03/27/2018   Procedure: Left Heart Cath with possible coronary intervention;  Surgeon: Bathe Denyse DELENA, MD;  Location[facility] INVASIVE CV LAB;  Service: Cardiovascular;  Laterality: Left;   LUMBAR FUSION  05/08/2022   TOTAL KNEE  ARTHROPLASTY Right 10/08/2018   Procedure: TOTAL KNEE ARTHROPLASTY;  Surgeon: Leora Lynwood SAUNDERS, MD;  Location: [facility] ORS;  Service: Orthopedics;  Laterality: Right;    Home Medications:  Allergies as of 06/12/2024       Reactions   Clonidine  Derivatives Other (See Comments), Shortness Of Breath   Low heart rate Low heart rate    Mild bradycardia + Somnolence    Low heart rate Mild bradycardia + Somnolence Mild bradycardia + Somnolence    Low heart rate Mild bradycardia + Somnolence Mild bradycardia + Somnolence Mild bradycardia + Somnolence Mild bradycardia + Somnolence clonidine    Clonidine     Mild bradycardia + Somnolence        Medication List        Accurate as of June 12, 2024  8:39 AM. If you have any questions, ask your nurse or doctor.          acetaminophen  500 MG tablet Commonly known as: TYLENOL  Take by mouth.   aspirin  EC 81 MG tablet Take 1 tablet (81 mg total) by mouth daily.   buPROPion  300 MG 24 hr tablet Commonly known as: Wellbutrin  XL Take 1 tablet (300 mg total) by mouth in the morning. Stop the 150 mg , dose increase   chlorthalidone  25 MG tablet Commonly known as: HYGROTON  Take 1 tablet (25 mg total) by mouth daily.   Cholecalciferol  125 MCG (5000 UT) capsule Take 5,000 Units by mouth daily.   clobetasol 0.05 % Gel Commonly known as: TEMOVATE Apply topically.   clopidogrel  75 MG  tablet Commonly known as: PLAVIX  Take 1 tablet (75 mg total) by mouth daily.   clotrimazole -betamethasone  cream Commonly known as: Lotrisone  Apply 1 application topically 2 (two) times daily.   cyanocobalamin  1000 MCG tablet Commonly known as: VITAMIN B12 Take 1,000 mcg by mouth daily.   dexamethasone  0.5 MG/5ML solution Commonly known as: DECADRON    diclofenac Sodium 1 % Gel Commonly known as: VOLTAREN   DULoxetine  20 MG capsule Commonly known as: CYMBALTA  TAKE 1 CAPSULE BY MOUTH EVERY DAY ALONG WITH 60MG  FOR TOTAL OF 80MG    DULoxetine  60  MG capsule Commonly known as: CYMBALTA  TAKE 1 CAPSULE BY MOUTH DAILY ALONG WITHTHE 20MG  FOR A TOTAL OF 80MG    dutasteride  0.5 MG capsule Commonly known as: AVODART  TAKE 1 CAPSULE BY MOUTH EVERY DAY IN THEEVENING   fluticasone  50 MCG/ACT nasal spray Commonly known as: FLONASE  Place 2 sprays into both nostrils daily.   hydrocortisone  2.5 % rectal cream Commonly known as: ANUSOL -HC APPLY RECTALLY TWICE DAILY AS DIRECTED   hydrOXYzine  25 MG capsule Commonly known as: Vistaril  Take 1 capsule (25 mg total) by mouth 2 (two) times daily as needed for anxiety.   icosapent  Ethyl 1 g capsule Commonly known as: Vascepa  Take 2 capsules (2 g total) by mouth 2 (two) times daily.   losartan  50 MG tablet Commonly known as: COZAAR  Take 1 tablet (50 mg total) by mouth daily.   mupirocin  ointment 2 % Commonly known as: BACTROBAN  Apply 1 Application topically daily.   niacin  1000 MG CR tablet Commonly known as: NIASPAN  Take 2 tablets (2,000 mg total) by mouth at bedtime.   pantoprazole  20 MG tablet Commonly known as: PROTONIX  Take 1 tablet (20 mg total) by mouth daily.   rosuvastatin  40 MG tablet Commonly known as: CRESTOR  Take 1 tablet (40 mg total) by mouth every evening.   spironolactone  25 MG tablet Commonly known as: ALDACTONE  Take 1 tablet (25 mg total) by mouth daily.   tamsulosin  0.4 MG Caps capsule Commonly known as: FLOMAX  Take 1 capsule (0.4 mg total) by mouth daily.   testosterone  cypionate 200 MG/ML injection Commonly known as: DEPOTESTOSTERONE CYPIONATE INJECT 0.75 ML INTRAMUSCULARLY EVERY 10 DAYS        Allergies:  Allergies  Allergen Reactions   Clonidine  Derivatives Other (See Comments) and Shortness Of Breath    Low heart rate  Low heart rate    Mild bradycardia + Somnolence    Low heart rate Mild bradycardia + Somnolence Mild bradycardia + Somnolence    Low heart rate Mild bradycardia + Somnolence Mild bradycardia + Somnolence Mild bradycardia +  Somnolence Mild bradycardia + Somnolence  clonidine    Clonidine      Mild bradycardia + Somnolence    Family History: Family History  Problem Relation Age of Onset   Diabetes Mother    Hypertension Mother    Cancer Father    Depression Father    Anxiety disorder Father    Depression Daughter     Social History:  reports that he quit smoking about 17 years ago. His smoking use included cigars and cigarettes. He has never used smokeless tobacco. He reports that he does not drink alcohol and does not use drugs.   Physical Exam: BP 126/75   Pulse 63   Ht 6' 1 (1.854 m)   Wt 195 lb (88.5 kg)   BMI 25.73 kg/m   Constitutional:  Alert, No acute distress. HEENT: [place] AT Respiratory: Normal respiratory effort, no increased work of breathing. Psychiatric: Normal mood  and affect.   Assessment & Plan:    1.  Hypogonadism Doing well on TRT Lab visit 6 months testosterone , H/H Office visit 1 year testosterone , H/H, PSA  2.  BPH with LUTS Stable on tamsulosin /dutasteride    Glendia JAYSON Barba, MD  [facility] [address], [address] [place], [place] [address] [phone]

## 2024-06-16 ENCOUNTER — Other Ambulatory Visit: Payer: Self-pay | Admitting: Psychiatry

## 2024-06-16 DIAGNOSIS — G4733 Obstructive sleep apnea (adult) (pediatric): Secondary | ICD-10-CM | POA: Diagnosis not present

## 2024-06-16 DIAGNOSIS — F331 Major depressive disorder, recurrent, moderate: Secondary | ICD-10-CM

## 2024-06-19 DIAGNOSIS — G4733 Obstructive sleep apnea (adult) (pediatric): Secondary | ICD-10-CM | POA: Diagnosis not present

## 2024-06-19 DIAGNOSIS — I10 Essential (primary) hypertension: Secondary | ICD-10-CM | POA: Diagnosis not present

## 2024-06-25 ENCOUNTER — Ambulatory Visit: Admitting: Cardiovascular Disease

## 2024-06-25 ENCOUNTER — Encounter: Payer: Self-pay | Admitting: Cardiovascular Disease

## 2024-06-25 VITALS — BP 115/77 | HR 77 | Ht 73.0 in | Wt 193.4 lb

## 2024-06-25 DIAGNOSIS — I2510 Atherosclerotic heart disease of native coronary artery without angina pectoris: Secondary | ICD-10-CM

## 2024-06-25 DIAGNOSIS — G90521 Complex regional pain syndrome I of right lower limb: Secondary | ICD-10-CM | POA: Diagnosis not present

## 2024-06-25 DIAGNOSIS — M5416 Radiculopathy, lumbar region: Secondary | ICD-10-CM | POA: Diagnosis not present

## 2024-06-25 DIAGNOSIS — G4733 Obstructive sleep apnea (adult) (pediatric): Secondary | ICD-10-CM

## 2024-06-25 DIAGNOSIS — I10 Essential (primary) hypertension: Secondary | ICD-10-CM | POA: Diagnosis not present

## 2024-06-25 DIAGNOSIS — E782 Mixed hyperlipidemia: Secondary | ICD-10-CM | POA: Diagnosis not present

## 2024-06-25 DIAGNOSIS — I25110 Atherosclerotic heart disease of native coronary artery with unstable angina pectoris: Secondary | ICD-10-CM

## 2024-06-25 DIAGNOSIS — M961 Postlaminectomy syndrome, not elsewhere classified: Secondary | ICD-10-CM | POA: Diagnosis not present

## 2024-06-25 DIAGNOSIS — Z981 Arthrodesis status: Secondary | ICD-10-CM | POA: Diagnosis not present

## 2024-06-25 NOTE — Progress Notes (Signed)
 Cardiology Office Note   Date:  06/25/2024   ID:  Kenneth Pruitt Jan 02, 1951, MRN [id number]  PCP:  Vicci Duwaine SQUIBB, DO  Cardiologist:  Denyse Bathe, MD      History of Present Illness: Kenneth Pruitt is a 73 y.o. male who presents for  Chief Complaint  Patient presents with   Follow-up    3 month follow up    Losing weight, no chest pain or SOB.      Past Medical History:  Diagnosis Date   Arthritis    Coronary artery disease    Depression    Hyperlipidemia    Skin cancer    left forearm treated years ago by Dr. Oneil Rolling     Past Surgical History:  Procedure Laterality Date   ANKLE FRACTURE SURGERY Right    ARTHRODESIS ANT INTERBODY INC DISCECTOMY, CERVICAL BELOW C2    05/2019   CARDIAC CATHETERIZATION N/A 04/26/2015   Procedure: Left Heart Cath;  Surgeon: Denyse DELENA Bathe, MD;  Location: [facility] INVASIVE CV LAB;  Service: Cardiovascular;  Laterality: N/A;   CLAVICLE SURGERY Right 05/04/2021   CORONARY ANGIOPLASTY     CORONARY STENT INTERVENTION N/A 03/27/2018   Procedure: CORONARY STENT INTERVENTION;  Surgeon: Florencio Cara BIRCH, MD;  Location: [facility] INVASIVE CV LAB;  Service: Cardiovascular;  Laterality: N/A;   EYE SURGERY Bilateral    cataract   FOOT SURGERY Left 08/22/2022   FOOT SURGERY Right 10/31/2022   LEFT HEART CATH AND CORONARY ANGIOGRAPHY Left 03/27/2018   Procedure: Left Heart Cath with possible coronary intervention;  Surgeon: Bathe Denyse DELENA, MD;  Location: [facility] INVASIVE CV LAB;  Service: Cardiovascular;  Laterality: Left;   LUMBAR FUSION  05/08/2022   TOTAL KNEE ARTHROPLASTY Right 10/08/2018   Procedure: TOTAL KNEE ARTHROPLASTY;  Surgeon: Leora Lynwood SAUNDERS, MD;  Location: [facility] ORS;  Service: Orthopedics;  Laterality: Right;     Current Outpatient Medications  Medication Sig Dispense Refill   acetaminophen  (TYLENOL ) 500 MG tablet Take by mouth.     aspirin  81 MG EC tablet Take 1 tablet (81 mg total) by mouth daily. 90 tablet 3    buPROPion  (WELLBUTRIN  XL) 300 MG 24 hr tablet TAKE ONE TABLET BY MOUTH EVERY MORNING STOP 150MG  DOSE INCREASE 90 tablet 3   chlorthalidone  (HYGROTON ) 25 MG tablet Take 1 tablet (25 mg total) by mouth daily. 90 tablet 0   Cholecalciferol  125 MCG (5000 UT) capsule Take 5,000 Units by mouth daily.     clobetasol (TEMOVATE) 0.05 % GEL Apply topically.     clopidogrel  (PLAVIX ) 75 MG tablet Take 1 tablet (75 mg total) by mouth daily. 90 tablet 1   clotrimazole -betamethasone  (LOTRISONE ) cream Apply 1 application topically 2 (two) times daily. 30 g 0   dexamethasone  (DECADRON ) 0.5 MG/5ML solution      diclofenac Sodium (VOLTAREN) 1 % GEL      DULoxetine  (CYMBALTA ) 20 MG capsule TAKE 1 CAPSULE BY MOUTH EVERY DAY ALONG WITH 60MG  FOR TOTAL OF 80MG  90 capsule 3   DULoxetine  (CYMBALTA ) 60 MG capsule TAKE 1 CAPSULE BY MOUTH DAILY ALONG WITHTHE 20MG  FOR A TOTAL OF 80MG  90 capsule 3   dutasteride  (AVODART ) 0.5 MG capsule TAKE 1 CAPSULE BY MOUTH EVERY DAY IN THEEVENING 90 capsule 1   fluticasone  (FLONASE ) 50 MCG/ACT nasal spray Place 2 sprays into both nostrils daily. 16 g 12   hydrocortisone  (ANUSOL -HC) 2.5 % rectal cream APPLY RECTALLY TWICE DAILY AS DIRECTED 30 g 0   hydrOXYzine  (  VISTARIL ) 25 MG capsule Take 1 capsule (25 mg total) by mouth 2 (two) times daily as needed for anxiety. 60 capsule 1   icosapent  Ethyl (VASCEPA ) 1 g capsule Take 2 capsules (2 g total) by mouth 2 (two) times daily. 360 capsule 1   losartan  (COZAAR ) 50 MG tablet Take 1 tablet (50 mg total) by mouth daily. 90 tablet 1   mupirocin  ointment (BACTROBAN ) 2 % Apply 1 Application topically daily. 22 g 0   niacin  (NIASPAN ) 1000 MG CR tablet Take 2 tablets (2,000 mg total) by mouth at bedtime. 180 tablet 2   pantoprazole  (PROTONIX ) 20 MG tablet Take 1 tablet (20 mg total) by mouth daily. 90 tablet 1   rosuvastatin  (CRESTOR ) 40 MG tablet Take 1 tablet (40 mg total) by mouth every evening. 90 tablet 1   spironolactone  (ALDACTONE ) 25 MG tablet  Take 1 tablet (25 mg total) by mouth daily. 90 tablet 1   tamsulosin  (FLOMAX ) 0.4 MG CAPS capsule Take 1 capsule (0.4 mg total) by mouth daily. 90 capsule 1   testosterone  cypionate (DEPOTESTOSTERONE CYPIONATE) 200 MG/ML injection INJECT 0.75 ML INTRAMUSCULARLY EVERY 10 DAYS 10 mL 0   vitamin B-12 (CYANOCOBALAMIN ) 1000 MCG tablet Take 1,000 mcg by mouth daily.     No current facility-administered medications for this visit.    Allergies:   Clonidine  derivatives and Clonidine     Social History:   reports that he quit smoking about 17 years ago. His smoking use included cigars and cigarettes. He has never used smokeless tobacco. He reports that he does not drink alcohol and does not use drugs.   Family History:  family history includes Anxiety disorder in his father; Cancer in his father; Depression in his daughter and father; Diabetes in his mother; Hypertension in his mother.    ROS:     Review of Systems  Constitutional: Negative.   HENT: Negative.    Eyes: Negative.   Respiratory: Negative.    Gastrointestinal: Negative.   Genitourinary: Negative.   Musculoskeletal: Negative.   Skin: Negative.   Neurological: Negative.   Endo/Heme/Allergies: Negative.   Psychiatric/Behavioral: Negative.    All other systems reviewed and are negative.     All other systems are reviewed and negative.    PHYSICAL EXAM: VS:  BP 115/77   Pulse 77   Ht 6' 1 (1.854 m)   Wt 193 lb 6.4 oz (87.7 kg)   SpO2 96%   BMI 25.52 kg/m  , BMI Body mass index is 25.52 kg/m. Last weight:  Wt Readings from Last 3 Encounters:  06/25/24 193 lb 6.4 oz (87.7 kg)  06/12/24 195 lb (88.5 kg)  06/02/24 196 lb (88.9 kg)     Physical Exam Vitals reviewed.  Constitutional:      Appearance: Normal appearance. He is normal weight.  HENT:     Head: Normocephalic.     Nose: Nose normal.     Mouth/Throat:     Mouth: Mucous membranes are moist.  Eyes:     Pupils: Pupils are equal, round, and reactive to  light.  Cardiovascular:     Rate and Rhythm: Normal rate and regular rhythm.     Pulses: Normal pulses.     Heart sounds: Normal heart sounds.  Pulmonary:     Effort: Pulmonary effort is normal.  Abdominal:     General: Abdomen is flat. Bowel sounds are normal.  Musculoskeletal:        General: Normal range of motion.  Cervical back: Normal range of motion.  Skin:    General: Skin is warm.  Neurological:     General: No focal deficit present.     Mental Status: He is alert.  Psychiatric:        Mood and Affect: Mood normal.       EKG:   Recent Labs: 11/29/2023: TSH 1.320 01/09/2024: BUN 19; Creatinine, Ser 1.52; Potassium 3.3; Sodium 138 03/05/2024: Platelet Count 251 06/02/2024: ALT 21 06/11/2024: Hemoglobin 16.7    Lipid Panel    Component Value Date/Time   CHOL 90 (L) 06/02/2024 1352   CHOL 115 08/06/2016 0954   TRIG 103 06/02/2024 1352   TRIG 188 (H) 08/06/2016 0954   HDL 38 (L) 06/02/2024 1352   CHOLHDL 2.5 08/20/2019 0836   CHOLHDL 3.3 03/28/2018 0854   VLDL 37 03/28/2018 0854   VLDL 38 (H) 08/06/2016 0954   LDLCALC 32 06/02/2024 1352      Other studies Reviewed: Additional studies/ records that were reviewed today include:  Review of the above records demonstrates:       No data to display            ASSESSMENT AND PLAN:    ICD-10-CM   1. Coronary artery disease with unstable angina pectoris, unspecified vessel or lesion type, unspecified whether native or transplanted heart (HCC)  I25.110    No chest pain or dizziness or SOB.    2. Essential hypertension  I10    off amlodapine as BP stable as lossing weight. Last creat 1.38 from1.48    3. Coronary artery disease involving native coronary artery of native heart without angina pectoris  I25.10     4. OSA on CPAP  G47.33     5. Mixed hyperlipidemia  E78.2        Problem List Items Addressed This Visit       Cardiovascular and Mediastinum   Essential hypertension   CAD (coronary  artery disease) - Primary     Respiratory   OSA on CPAP     Other   Hyperlipidemia       Disposition:   Return in about 3 months (around 09/25/2024).    Total time spent: 30 minutes  Signed,  Denyse Bathe, MD  06/25/2024 9:46 AM    [facility]

## 2024-07-01 ENCOUNTER — Other Ambulatory Visit: Payer: Self-pay | Admitting: Family Medicine

## 2024-07-01 DIAGNOSIS — N401 Enlarged prostate with lower urinary tract symptoms: Secondary | ICD-10-CM

## 2024-07-02 DIAGNOSIS — R0789 Other chest pain: Secondary | ICD-10-CM | POA: Diagnosis not present

## 2024-07-02 DIAGNOSIS — W010XXA Fall on same level from slipping, tripping and stumbling without subsequent striking against object, initial encounter: Secondary | ICD-10-CM | POA: Diagnosis not present

## 2024-07-02 DIAGNOSIS — S298XXA Other specified injuries of thorax, initial encounter: Secondary | ICD-10-CM | POA: Diagnosis not present

## 2024-07-03 NOTE — Telephone Encounter (Signed)
 Too soon for refill,LRF 06/02/24 FOR 90 AND 1 RF.  Requested Prescriptions  Pending Prescriptions Disp Refills   dutasteride  (AVODART ) 0.5 MG capsule [Pharmacy Med Name: DUTASTERIDE  0.5 MG CAP] 90 capsule 1    Sig: TAKE 1 CAPSULE BY MOUTH EVERY EVENING     Urology: 5-alpha Reductase Inhibitors Passed - 07/03/2024 10:43 AM      Passed - PSA in normal range and within 360 days    Prostate Specific Ag, Serum  Date Value Ref Range Status  06/11/2024 3.6 0.0 - 4.0 ng/mL Final    Comment:    Roche ECLIA methodology. According to the American Urological Association, Serum PSA should decrease and remain at undetectable levels after radical prostatectomy. The AUA defines biochemical recurrence as an initial PSA value 0.2 ng/mL or greater followed by a subsequent confirmatory PSA value 0.2 ng/mL or greater. Values obtained with different assay methods or kits cannot be used interchangeably. Results cannot be interpreted as absolute evidence of the presence or absence of malignant disease.          Passed - Valid encounter within last 12 months    Recent Outpatient Visits           1 month ago Mixed hyperlipidemia   [facility] [place], [name], DO   7 months ago Encounter for [employer] annual wellness exam   [facility] [place], [place], DO   7 months ago [employer]    [facility] [place], Elmer, DO   7 months ago Boil   [facility] Melvin Pao, NP       Future Appointments             In 2 months Fernand Denyse LABOR, MD [facility]   In 2 months Hester Alm BROCKS, MD [facility]   In 11 months [name], Glendia BROCKS, MD [facility]

## 2024-07-08 ENCOUNTER — Other Ambulatory Visit: Payer: Self-pay | Admitting: Family Medicine

## 2024-07-08 DIAGNOSIS — N401 Enlarged prostate with lower urinary tract symptoms: Secondary | ICD-10-CM

## 2024-07-08 NOTE — Telephone Encounter (Unsigned)
 Copied from CRM #[id number]. Topic: Clinical - Medication Refill >> Jul 08, 2024 12:59 PM Charlet HERO wrote: Medication: dutasteride  (AVODART ) 0.5 MG capsule  Has the patient contacted their pharmacy? Yes Pharmacy states no reply from the office ckd the med shows it had on refill due for 10/17.  This is the patient's preferred pharmacy:  [facility] - [place], [place] - [address] [place] [address] Phone: [phone] Fax: [phone]  Is this the correct pharmacy for this prescription? Yes If no, delete pharmacy and type the correct one.   Has the prescription been filled recently? Yes  Is the patient out of the medication? Yes  Has the patient been seen for an appointment in the last year OR does the patient have an upcoming appointment? Yes  Can we respond through MyChart? Yes  Agent: Please be advised that Rx refills may take up to 3 business days. We ask that you follow-up with your pharmacy.

## 2024-07-10 NOTE — Telephone Encounter (Signed)
 Too soon for refill.  Requested Prescriptions  Pending Prescriptions Disp Refills   dutasteride  (AVODART ) 0.5 MG capsule 90 capsule 1    Sig: TAKE 1 CAPSULE BY MOUTH EVERY DAY IN THEEVENING     Urology: 5-alpha Reductase Inhibitors Passed - 07/10/2024 10:50 AM      Passed - PSA in normal range and within 360 days    Prostate Specific Ag, Serum  Date Value Ref Range Status  06/11/2024 3.6 0.0 - 4.0 ng/mL Final    Comment:    Roche ECLIA methodology. According to the American Urological Association, Serum PSA should decrease and remain at undetectable levels after radical prostatectomy. The AUA defines biochemical recurrence as an initial PSA value 0.2 ng/mL or greater followed by a subsequent confirmatory PSA value 0.2 ng/mL or greater. Values obtained with different assay methods or kits cannot be used interchangeably. Results cannot be interpreted as absolute evidence of the presence or absence of malignant disease.          Passed - Valid encounter within last 12 months    Recent Outpatient Visits           1 month ago Mixed hyperlipidemia   [facility] [place], [name], DO   7 months ago Encounter for [employer] annual wellness exam   [facility] Marks, Geyser, DO   7 months ago [employer]   [facility] [place], [place], DO   8 months ago Boil   [facility] Melvin Pao, NP       Future Appointments             In 2 months Fernand Denyse LABOR, MD [facility]   In 2 months Hester Alm BROCKS, MD [facility]   In 11 months [name], Glendia BROCKS, MD [facility]

## 2024-07-28 ENCOUNTER — Other Ambulatory Visit: Payer: Self-pay | Admitting: Urology

## 2024-07-28 DIAGNOSIS — E291 Testicular hypofunction: Secondary | ICD-10-CM

## 2024-08-18 DIAGNOSIS — G959 Disease of spinal cord, unspecified: Secondary | ICD-10-CM | POA: Diagnosis not present

## 2024-08-18 DIAGNOSIS — R252 Cramp and spasm: Secondary | ICD-10-CM | POA: Diagnosis not present

## 2024-09-14 ENCOUNTER — Other Ambulatory Visit: Payer: Self-pay | Admitting: Family Medicine

## 2024-09-14 DIAGNOSIS — E782 Mixed hyperlipidemia: Secondary | ICD-10-CM

## 2024-09-16 NOTE — Therapy (Incomplete)
 " OUTPATIENT PHYSICAL THERAPY BALANCE EVALUATION   Patient Name: Kenneth Pruitt MRN: [id number] DOB:02/16/1951, 73 y.o., male Today's Date: 09/16/2024  END OF SESSION:   Past Medical History:  Diagnosis Date   Arthritis    Coronary artery disease    Depression    Hyperlipidemia    Skin cancer    left forearm treated years ago by Dr. Oneil Rolling   Past Surgical History:  Procedure Laterality Date   ANKLE FRACTURE SURGERY Right    ARTHRODESIS ANT INTERBODY INC DISCECTOMY, CERVICAL BELOW C2    05/2019   CARDIAC CATHETERIZATION N/A 04/26/2015   Procedure: Left Heart Cath;  Surgeon: Denyse DELENA Bathe, MD;  Location: [facility]IVE CV [facility];  Service: Cardiovascular;  Laterality: N/A;   CLAVICLE SURGERY Right 05/04/2021   CORONARY ANGIOPLASTY     CORONARY STENT INTERVENTION N/A 03/27/2018   Procedure: CORONARY STENT INTERVENTION;  Surgeon: Florencio Cara BIRCH, MD;  Location: [facility];  Service: Cardiovascular;  Laterality: N/A;   EYE SURGERY Bilateral    cataract   FOOT SURGERY Left 08/22/2022   FOOT SURGERY Right 10/31/2022   LEFT HEART CATH AND CORONARY ANGIOGRAPHY Left 03/27/2018   Procedure: Left Heart Cath with possible coronary intervention;  Surgeon: Bathe Denyse DELENA, MD;  Location: [facility];  Service: Cardiovascular;  Laterality: Left;   LUMBAR FUSION  05/08/2022   TOTAL KNEE ARTHROPLASTY Right 10/08/2018   Procedure: TOTAL KNEE ARTHROPLASTY;  Surgeon: Leora Lynwood SAUNDERS, MD;  Location: [facility] ORS;  Service: Orthopedics;  Laterality: Right;   Patient Active Problem List   Diagnosis Date Noted   Recurrent major depressive disorder, in partial remission 02/11/2024   Proteinuria 01/29/2024   Senile purpura 11/28/2023   History of cardiac catheterization 07/16/2022   Fracture of clavicle 07/16/2022   Postoperative retention of urine 07/16/2022   Hammer toe 06/04/2022   Foot pain 06/04/2022   Acquired hallux rigidus 06/04/2022   Contusion of buttock  01/15/2022   S/P cervical spinal fusion 12/12/2021   Neuropathic pain, leg, right 12/12/2021   Insomnia due to medical condition 11/14/2021   Clavicle pain 09/26/2021   Lumbar radiculopathy 08/31/2021   Family history of prostate cancer 06/08/2020   MDD (major depressive disorder), recurrent, in full remission 12/31/2019   Bereavement 12/31/2019   GAD (generalized anxiety disorder) 08/05/2019   Insomnia due to mental disorder 08/05/2019   Orthostasis 05/28/2019   Allergic rhinitis 05/26/2019   OSA on CPAP 05/19/2019   Chronic kidney disease (CKD), stage III (moderate) (HCC) 05/19/2019   Weakness of right leg 05/10/2019   Weakness of right arm 05/10/2019   S/P TKR (total knee replacement) using cement, right 10/08/2018   Hypokalemia 09/30/2018   CAD (coronary artery disease) 03/27/2018   Unstable angina (HCC) 03/24/2018   Skin lesions 10/16/2017   BPH (benign prostatic hyperplasia) 10/16/2017   Advanced care planning/counseling discussion 02/05/2017   Polycythemia, secondary 08/28/2016   Knee pain, right 08/06/2016   Elevated PSA 08/06/2016   Hypogonadism in male 11/17/2015   Hyperlipidemia 11/17/2015   Essential hypertension 11/17/2015   Erectile dysfunction 03/07/2015   Arthritis of knee 12/18/2011   Primary localized osteoarthrosis, lower leg 11/21/2011    PCP: Vicci Duwaine SQUIBB, DO  REFERRING PROVIDER: Marjorie Leisure, MD   REFERRING DIAG: ***  RATIONALE FOR EVALUATION AND TREATMENT: Rehabilitation  THERAPY DIAG: No diagnosis found.  ONSET DATE: ***  FOLLOW-UP APPT SCHEDULED WITH REFERRING PROVIDER: {yes/no:20286}    SUBJECTIVE:  SUBJECTIVE STATEMENT:  ***  PERTINENT HISTORY:  Lower right weakness and pain   Kenneth Pruitt is a 73 y.o. 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 When I looked at his MRI from 2023 it looks like there is some cord signal on the lateral aspects of the cord bilaterally at C4. Spinal cord is decompressed and there is no thoracic cord lesion. His arm got rapidly better after the surgery but he has maintained problems in his legs particularly on the right where he has weakness there. He cannot always get it to be controlled and if he does something with a lot of activity like bike riding sometimes it feels more fatigued. He gets pain distally in the leg. He has tried gabapentin  and that did not help so now is on Cymbalta . He used to do triathlons. He still does a lot of biking. He is wondering if there is anything he can do to get better with his leg function.  Assessment and plan. 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 This explains his hip flexor weakness as well as some increased tone in his legs. We decided to do an experiment with baclofen 10 mg twice a day to see if that would help with his leg movement. If he feels weaker he should stop it. If he feels like it is not doing anything in several days he can increase to 1 3 times a day. If he wants to discuss dosage in the future he can call me via MyChart. It is important for him to stretch regularly.  I gave him an external referral to PT to work on hip flexor strengthening. I do not think bike riding is particularly going to strengthen his hip flexors as he is probably using the other leg to push down instead of using his weak leg to pull. Unfortunately there is nothing we can do about the past cervical cord injury. For completeness sake though I did want to check some screening labs such as B12, copper and vitamin E.  Falls risk plan: Refer to PT      Pain: {yes/no:20286} Numbness/Tingling: {yes/no:20286} Focal Weakness:  {yes/no:20286} Recent changes in overall health/medication: {yes/no:20286} Prior history of physical therapy for balance:  {yes/no:20286} Dominant hand: {RIGHT/LEFT:20294} Imaging: {yes/no:20286}  Red flags: Negative for bowel/bladder changes, saddle paresthesia, personal history of cancer, h/o spinal tumors, h/o compression fx, h/o abdominal aneurysm, abdominal pain, chills/fever, night sweats, nausea, vomiting, unrelenting pain, first onset of insidious LBP <20 y/o  PRECAUTIONS: {Therapy precautions:24002}  WEIGHT BEARING RESTRICTIONS: {Yes ***/No:24003}  FALLS: Has patient fallen in last 6 months? {fallsyesno:27318}, Directional pattern for falls: {Yes ***/No:24003}  Living Environment Lives with: {[facility] lives with:25569::lives with their family} Lives in: {Lives in:25570} Stairs: {opstairs:27293} Has following equipment at home: {Assistive devices:23999}  Prior level of function: {PLOF:24004}  Occupational demands:   Hobbies:   Patient Goals: ***   OBJECTIVE:   Patient Surveys  FOTO: ,predicted improvement to  ABC:   Cognition Patient is oriented to person, place, and time.  Recent memory is intact.  Remote memory is intact.  Attention span and concentration are intact.  Expressive speech is intact.  Patient's fund of knowledge is within normal limits for educational level.    Gross Musculoskeletal Assessment Tremor: None Bulk: Normal Tone: Normal  Posture: No gross abnormalities noted  in standing or seated posture  AROM AROM (Normal range in degrees) AROM   Lumbar   Flexion (65)   Extension (30)   Right lateral flexion (25)   Left lateral flexion (25)   Right rotation (30)   Left rotation (30)       Hip Right Left  Flexion (125)    Extension (15)    Abduction (40)    Adduction     Internal Rotation (45)    External Rotation (45)        Knee    Flexion (135)    Extension (0)        Ankle    Dorsiflexion (20)    Plantarflexion (50)     Inversion (35)    Eversion (15)    (* = pain; Blank rows = not tested)  LE MMT: MMT (out of 5) Right  Left   Hip flexion    Hip extension    Hip abduction    Hip adduction    Hip internal rotation    Hip external rotation    Knee flexion    Knee extension    Ankle dorsiflexion    Ankle plantarflexion    Ankle inversion    Ankle eversion    (* = pain; Blank rows = not tested)  Sensation Grossly intact to light touch throughout bilateral LEs as determined by testing dermatomes L2-S2. Proprioception, stereognosis, and hot/cold testing deferred on this date.  Reflexes R/L Knee Jerk (L3/4): 2+/2+  Ankle Jerk (S1/2): 2+/2+   Cranial Nerves Visual acuity and visual fields are intact  Extraocular muscles are intact  Facial sensation is intact bilaterally  Facial strength is intact bilaterally  Hearing is normal as tested by gross conversation Palate elevates midline, normal phonation  Shoulder shrug strength is intact  Tongue protrudes midline  Coordination/Cerebellar Finger to Nose: WNL Heel to Shin: WNL Rapid alternating movements: WNL Finger Opposition: WNL Pronator Drift: Negative  Bed mobility: {Bed mobility:24027}  Transfers: Assistive device utilized: {Assistive devices:23999}  Sit to stand: {Levels of assistance:24026} Stand to sit: {Levels of assistance:24026} Chair to chair: {Levels of assistance:24026} Floor: {Levels of assistance:24026}  Curb:  Level of Assistance: {Levels of assistance:24026} Assistive device utilized: {Assistive devices:23999} Curb Comments: ***  Stairs: Level of Assistance: {Levels of assistance:24026} Stair Negotiation Technique: {Stair Technique:27161} with {Rail Assistance:27162} Number of Stairs: ***  Height of Stairs: ***  Comments: ***  Gait: Gait pattern: {gait characteristics:25376} Distance walked: *** Assistive device utilized: {Assistive devices:23999} Level of assistance: {Levels of  assistance:24026} Comments: ***  Functional Outcome Measures  Results Comments  BERG /56   DGI /24   FGA /30   TUG seconds   5TSTS seconds   6 Minute Walk Test    10 Meter Gait Speed Self-selected: s = m/s; Fastest: s = m/s   (Blank rows = not tested)   TODAY'S TREATMENT  ***  PATIENT EDUCATION:  Education details: *** Person educated: {Person educated:25204} Education method: {Education Method:25205} Education comprehension: {Education Comprehension:25206}   HOME EXERCISE PROGRAM:    ASSESSMENT:  CLINICAL IMPRESSION: Patient is a *** y.o. *** who was seen today for physical therapy evaluation and treatment for ***.   OBJECTIVE IMPAIRMENTS: {opptimpairments:25111}.   ACTIVITY LIMITATIONS: {activitylimitations:27494}  PARTICIPATION LIMITATIONS: {participationrestrictions:25113}  PERSONAL FACTORS: {Personal factors:25162} are also affecting patient's functional outcome.   REHAB POTENTIAL: {rehabpotential:25112}  CLINICAL DECISION MAKING: {clinical decision making:25114}  EVALUATION COMPLEXITY: {Evaluation complexity:25115}   GOALS: Goals reviewed with patient? {yes/no:20286}  SHORT TERM GOALS:  Target date: {follow up:25551}  Pt will be independent with HEP in order to improve strength and balance in order to decrease fall risk and improve function at home. Baseline: *** Goal status: INITIAL   LONG TERM GOALS: Target date: {follow up:25551}  Pt will increase FOTO to at least *** to demonstrate significant improvement in function at home related to balance  Baseline:  Goal status: INITIAL  2.  Pt will improve BERG by at least 3 points in order to demonstrate clinically significant improvement in balance.   Baseline: *** Goal status: INITIAL  3.  Pt will improve ABC by at least 13% in order to demonstrate clinically significant improvement in balance confidence.      Baseline: *** Goal status: INITIAL  4. Pt will decrease 5TSTS by at least 3 seconds  in order to demonstrate clinically significant improvement in LE strength      Baseline: *** Goal status: INITIAL  5. Pt will improve DGI by at least 3 points in order to demonstrate clinically significant improvement in balance and decreased risk for falls.     Baseline: *** Goal status: INITIAL  6. Pt will decrease TUG to below 14 seconds/decrease in order to demonstrate decreased fall risk.  Baseline: *** Goal status: INITIAL  7. Pt will increase by at least 19m (176ft) in order to demonstrate clinically significant improvement in cardiopulmonary endurance and community ambulation   Baseline: *** Goal status: INITIAL   PLAN: PT FREQUENCY: 2x/week  PT DURATION: 8 weeks  PLANNED INTERVENTIONS: Therapeutic exercises, Therapeutic activity, Neuromuscular re-education, Balance training, Gait training, Patient/Family education, Self Care, Joint mobilization, Joint manipulation, Vestibular training, Canalith repositioning, Orthotic/Fit training, DME instructions, Dry Needling, Electrical stimulation, Spinal manipulation, Spinal mobilization, Cryotherapy, Moist heat, Taping, Traction, Ultrasound, Ionotophoresis 4mg /ml Dexamethasone , Manual therapy, and Re-evaluation.  PLAN FOR NEXT SESSION: ***   Selinda BIRCH Adante Courington PT, DPT, GCS  Teresea Donley 09/16/2024, 2:33 PM  "

## 2024-09-22 ENCOUNTER — Ambulatory Visit

## 2024-09-23 NOTE — Therapy (Signed)
 " OUTPATIENT PHYSICAL THERAPY BALANCE EVALUATION   Patient Name: Kenneth Pruitt MRN: [id number] DOB:Jan 29, 1951, 74 y.o., male Today's Date: 09/24/2024  END OF SESSION:  PT End of Session - 09/24/24 1359     Visit Number 1    Number of Visits 17    Date for Recertification  11/19/24    Authorization Type eval: 09/24/24    PT Start Time 1405    PT Stop Time 1445    PT Time Calculation (min) 40 min    Equipment Utilized During Treatment Gait belt    Activity Tolerance Patient tolerated treatment well    Behavior During Therapy WFL for tasks assessed/performed         Past Medical History:  Diagnosis Date   Arthritis    Coronary artery disease    Depression    Hyperlipidemia    Skin cancer    left forearm treated years ago by Dr. Oneil Rolling   Past Surgical History:  Procedure Laterality Date   ANKLE FRACTURE SURGERY Right    ARTHRODESIS ANT INTERBODY INC DISCECTOMY, CERVICAL BELOW C2    05/2019   CARDIAC CATHETERIZATION N/A 04/26/2015   Procedure: Left Heart Cath;  Surgeon: Denyse DELENA Bathe, MD;  Location: [facility];  Service: Cardiovascular;  Laterality: N/A;   CLAVICLE SURGERY Right 05/04/2021   CORONARY ANGIOPLASTY     CORONARY STENT INTERVENTION N/A 03/27/2018   Procedure: CORONARY STENT INTERVENTION;  Surgeon: Florencio Cara BIRCH, MD;  Location: [facility];  Service: Cardiovascular;  Laterality: N/A;   EYE SURGERY Bilateral    cataract   FOOT SURGERY Left 08/22/2022   FOOT SURGERY Right 10/31/2022   LEFT HEART CATH AND CORONARY ANGIOGRAPHY Left 03/27/2018   Procedure: Left Heart Cath with possible coronary intervention;  Surgeon: Bathe Denyse DELENA, MD;  Location: [facility];  Service: Cardiovascular;  Laterality: Left;   LUMBAR FUSION  05/08/2022   TOTAL KNEE ARTHROPLASTY Right 10/08/2018   Procedure: TOTAL KNEE ARTHROPLASTY;  Surgeon: Leora Lynwood SAUNDERS, MD;  Location: [facility];  Service: Orthopedics;  Laterality: Right;   Patient Active  Problem List   Diagnosis Date Noted   Recurrent major depressive disorder, in partial remission 02/11/2024   Proteinuria 01/29/2024   Senile purpura 11/28/2023   History of cardiac catheterization 07/16/2022   Fracture of clavicle 07/16/2022   Postoperative retention of urine 07/16/2022   Hammer toe 06/04/2022   Foot pain 06/04/2022   Acquired hallux rigidus 06/04/2022   Contusion of buttock 01/15/2022   S/P cervical spinal fusion 12/12/2021   Neuropathic pain, leg, right 12/12/2021   Insomnia due to medical condition 11/14/2021   Clavicle pain 09/26/2021   Lumbar radiculopathy 08/31/2021   Family history of prostate cancer 06/08/2020   MDD (major depressive disorder), recurrent, in full remission 12/31/2019   Bereavement 12/31/2019   GAD (generalized anxiety disorder) 08/05/2019   Insomnia due to mental disorder 08/05/2019   Orthostasis 05/28/2019   Allergic rhinitis 05/26/2019   OSA on CPAP 05/19/2019   Chronic kidney disease (CKD), stage III (moderate) (HCC) 05/19/2019   Weakness of right leg 05/10/2019   Weakness of right arm 05/10/2019   S/P TKR (total knee replacement) using cement, right 10/08/2018   Hypokalemia 09/30/2018   CAD (coronary artery disease) 03/27/2018   Unstable angina (HCC) 03/24/2018   Skin lesions 10/16/2017   BPH (benign prostatic hyperplasia) 10/16/2017   Advanced care planning/counseling discussion 02/05/2017   Polycythemia, secondary 08/28/2016   Knee pain, right 08/06/2016  Elevated PSA 08/06/2016   Hypogonadism in male 11/17/2015   Hyperlipidemia 11/17/2015   Essential hypertension 11/17/2015   Erectile dysfunction 03/07/2015   Arthritis of knee 12/18/2011   Primary localized osteoarthrosis, lower leg 11/21/2011   PCP: Vicci Duwaine SQUIBB, DO  REFERRING PROVIDER: Marjorie Leisure, MD   REFERRING DIAG:  G95.9 (ICD-10-CM) - Myelopathy (HCC)  [phone] (ICD-10-CM) - Hip flexor tendinitis   RATIONALE FOR EVALUATION AND TREATMENT:  Rehabilitation  THERAPY DIAG: Unsteadiness on feet  Muscle weakness (generalized)  ONSET DATE: ***  FOLLOW-UP APPT SCHEDULED WITH REFERRING PROVIDER: {yes/no:20286}    SUBJECTIVE:                                                                                                                                                                                         SUBJECTIVE STATEMENT:  ***  PERTINENT HISTORY:  Pt states that he has been active for multiple years with triathalons and long distance cycling. In the summer of 2020 he began experiencing falling and losing and his balance. He was referred to [facility] neurosurgery and saw Dr. 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 He states his UE strength improved but his RLE strength never improved. He states that he is unsteady, drags the RLE, and wobbles. He had PT 3-4 years ago without significant improvement in RLE strength. Hx of lower lumbar fusion due to compression on L5 and he still gets some pain from the lateral lower leg to the top of the foot.   Lower right weakness and pain   Kenneth Pruitt is a 74 y.o. male who states that he was having trouble with his right arm and his legs back and 2020 and was found to have cervical stenosis. When I looked at his MRI from 2023 it looks like there is some cord signal on the lateral aspects of the cord bilaterally at C4. Spinal cord is decompressed and there is no thoracic cord lesion. His arm got rapidly better after the surgery but he has maintained problems in his legs particularly on the right where he has weakness there. He cannot always get it to be controlled and if he does something with a lot of activity like bike riding sometimes it feels more fatigued. He gets pain distally in the leg. He has tried gabapentin  and that did not help so now is on Cymbalta . He used to do triathlons. He still does a lot of biking. He is wondering  if there is anything he can do to get better with his leg function.  Assessment and plan. 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 This explains his hip flexor weakness as well as some increased tone in his legs. We decided to do an experiment with baclofen 10 mg twice a day to see if that would help with his leg movement. If he feels weaker he should stop it. If he feels like it is not doing anything in several days he can increase to 1 3 times a day. If he wants to discuss dosage in the future he can call me via MyChart. It is important for him to stretch regularly.  I gave him an external referral to PT to work on hip flexor strengthening. I do not think bike riding is particularly going to strengthen his hip flexors as he is probably using the other leg to push down instead of using his weak leg to pull. Unfortunately there is nothing we can do about the past cervical cord injury. For completeness sake though I did want to check some screening labs such as B12, copper and vitamin E.  Falls risk plan: Refer to PT  Pain: Yes, R lower leg to top of the foot Numbness/Tingling: Yes, tingling and numbness in the R lower leg and foot Focal Weakness: Yes, R hip flexor weakness and R dorsiflexor weakness; Recent changes in overall health/medication: {yes/no:20286} Prior history of physical therapy for balance:  Yes, after cervical fusion; Dominant hand: {RIGHT/LEFT:20294} Imaging: Yes, see history; Red flags: Negative for bowel/bladder changes, saddle paresthesia, personal history of cancer, h/o spinal tumors, h/o compression fx, h/o abdominal aneurysm, abdominal pain, chills/fever, night sweats, nausea, vomiting, unrelenting pain, first onset of insidious LBP <20 y/o  PRECAUTIONS: None  WEIGHT BEARING RESTRICTIONS: No  FALLS: Has patient fallen in last 6 months? Yes. Number of falls >20 falls,   Living Environment Lives  with: lives with their spouse Lives in: House/apartment, townhouse Stairs: No  Prior level of function: Independent  Occupational demands: Retired financial planner  Hobbies: Cycling  Patient Goals: Increase RLE strength   OBJECTIVE:   Patient Surveys  ABC: To be completed  Cognition Patient is oriented to person, place, and time.  Recent memory is intact.  Remote memory is intact.  Attention span and concentration are intact.  Expressive speech is intact.  Patient's fund of knowledge is within normal limits for educational level.    Gross Musculoskeletal Assessment Tremor: None Bulk: Normal Tone: Normal  Posture: Deferred  AROM Deferred  LE MMT: MMT (out of 5) Right  Left   Hip flexion 4 5  Hip extension 3+ 3+  Hip abduction 3+ 4  Hip adduction 4+ 5  Hip internal rotation 4+ 5  Hip external rotation 4+ 4+  Knee flexion 5 4  Knee extension 5 5  Ankle dorsiflexion 4+ 4+  Ankle plantarflexion 10 reps 10 reps  Ankle inversion 5 5  Ankle eversion 5 5  (* = pain; Blank rows = not tested)  Sensation Deferred  Reflexes Deferred  Cranial Nerves Deferred  Coordination/Cerebellar Deferred  Bed mobility: Deferred  Transfers: Assistive device utilized: None  Sit to stand: Complete Independence Stand to sit: Complete Independence Chair to chair: Complete Independence Floor: Deferred  Curb:  Deferred  Stairs: Level of Assistance: Modified independence Stair Negotiation Technique: Alternating Pattern  with Bilateral Rails Number of Stairs: ***  Height of Stairs: ***  Comments: ***  Gait: Gait pattern: {gait characteristics:25376} Distance walked: *** Assistive device utilized: {Assistive devices:23999} Level of assistance: {Levels of assistance:24026}  Comments: ***  Functional Outcome Measures  Results Comments  BERG /56   DGI /24   FGA /30   TUG seconds   5TSTS seconds   6 Minute Walk Test    10 Meter Gait Speed Self-selected: s =  m/s; Fastest: s = m/s   (Blank rows = not tested)   TODAY'S TREATMENT  ***   PATIENT EDUCATION:  Education details: *** Person educated: {Person educated:25204} Education method: {Education Method:25205} Education comprehension: {Education Comprehension:25206}   HOME EXERCISE PROGRAM:    ASSESSMENT:  CLINICAL IMPRESSION: Patient is a 74 y.o. male who was seen today for physical therapy evaluation RLE weakness and unsteady.   OBJECTIVE IMPAIRMENTS: {opptimpairments:25111}.   ACTIVITY LIMITATIONS: {activitylimitations:27494}  PARTICIPATION LIMITATIONS: {participationrestrictions:25113}  PERSONAL FACTORS: {Personal factors:25162} are also affecting patient's functional outcome.   REHAB POTENTIAL: {rehabpotential:25112}  CLINICAL DECISION MAKING: {clinical decision making:25114}  EVALUATION COMPLEXITY: {Evaluation complexity:25115}   GOALS: Goals reviewed with patient? {yes/no:20286}  SHORT TERM GOALS: Target date: {follow up:25551}  Pt will be independent with HEP in order to improve strength and balance in order to decrease fall risk and improve function at home. Baseline: *** Goal status: INITIAL   LONG TERM GOALS: Target date: {follow up:25551}   Baseline:  Goal status: INITIAL  2.  Pt will improve BERG by at least 3 points in order to demonstrate clinically significant improvement in balance.   Baseline: *** Goal status: INITIAL  3.  Pt will improve ABC by at least 13% in order to demonstrate clinically significant improvement in balance confidence.      Baseline: *** Goal status: INITIAL  4. Pt will decrease 5TSTS by at least 3 seconds in order to demonstrate clinically significant improvement in LE strength      Baseline: *** Goal status: INITIAL  5. Pt will improve DGI by at least 3 points in order to demonstrate clinically significant improvement in balance and decreased risk for falls.     Baseline: *** Goal status: INITIAL  6. Pt will  decrease TUG to below 14 seconds/decrease in order to demonstrate decreased fall risk.  Baseline: *** Goal status: INITIAL  7. Pt will increase by at least 60m (124ft) in order to demonstrate clinically significant improvement in cardiopulmonary endurance and community ambulation   Baseline: *** Goal status: INITIAL   PLAN: PT FREQUENCY: 2x/week  PT DURATION: 8 weeks  PLANNED INTERVENTIONS: Therapeutic exercises, Therapeutic activity, Neuromuscular re-education, Balance training, Gait training, Patient/Family education, Self Care, Joint mobilization, Joint manipulation, Vestibular training, Canalith repositioning, Orthotic/Fit training, DME instructions, Dry Needling, Electrical stimulation, Spinal manipulation, Spinal mobilization, Cryotherapy, Moist heat, Taping, Traction, Ultrasound, Ionotophoresis 4mg /ml Dexamethasone , Manual therapy, and Re-evaluation.  PLAN FOR NEXT SESSION: ***   Selinda BIRCH Georgenia Salim PT, DPT, GCS  Krisha Beegle 09/24/2024, 2:00 PM  "

## 2024-09-24 ENCOUNTER — Ambulatory Visit: Attending: Neurology

## 2024-09-24 DIAGNOSIS — R2681 Unsteadiness on feet: Secondary | ICD-10-CM | POA: Diagnosis present

## 2024-09-24 DIAGNOSIS — M6281 Muscle weakness (generalized): Secondary | ICD-10-CM | POA: Diagnosis present

## 2024-09-25 ENCOUNTER — Ambulatory Visit: Admitting: Cardiovascular Disease

## 2024-09-28 ENCOUNTER — Encounter: Payer: Self-pay | Admitting: Psychiatry

## 2024-09-28 ENCOUNTER — Ambulatory Visit (INDEPENDENT_AMBULATORY_CARE_PROVIDER_SITE_OTHER): Admitting: Psychiatry

## 2024-09-28 ENCOUNTER — Other Ambulatory Visit: Payer: Self-pay

## 2024-09-28 VITALS — BP 128/82 | HR 105 | Temp 97.8°F | Ht 73.0 in | Wt 194.4 lb

## 2024-09-28 DIAGNOSIS — F411 Generalized anxiety disorder: Secondary | ICD-10-CM

## 2024-09-28 DIAGNOSIS — F3342 Major depressive disorder, recurrent, in full remission: Secondary | ICD-10-CM | POA: Diagnosis not present

## 2024-09-28 DIAGNOSIS — G4701 Insomnia due to medical condition: Secondary | ICD-10-CM | POA: Diagnosis not present

## 2024-09-28 NOTE — Progress Notes (Signed)
 [facility] MD OP Progress Note  09/28/2024 11:45 AM Kenneth Pruitt  MRN:  [id number]  Chief Complaint:  Chief Complaint  Patient presents with   Follow-up   Anxiety   Depression   Medication Refill   Insomnia   Discussed the use of AI scribe software for clinical note transcription with the patient, who gave verbal consent to proceed.  History of Present Illness Kenneth Pruitt [facility]) is a 74 year old Caucasian male, retired, married, lives in [place], has a history of MDD, GAD, insomnia, coronary artery disease status post stent placement, knee pain, multiple surgeries, testosterone  deficiency presents for a follow-up appointment.  Recent stressors, including a move to a new home and selling his previous house, have contributed to feelings of worry for him, particularly regarding making two house payments and managing the logistics of his daughter's family moving into his old home. He describes the move as hurried and rushed, with ongoing responsibilities related to unpacking and setting up the new house. Concerns about current world events and distress from watching the news also affect him. He denies experiencing significant depression or anxiety symptoms beyond these situational stressors .  He continues to take Cymbalta  (duloxetine ) 80 mg daily (a 60 mg and a 20 mg tablet) and bupropion  300 mg in the morning.  He denies side effects.  He reports that his sleep is generally good, though he wakes up frequently during the night to void but has no difficulty returning to sleep. He continues to use a CPAP for sleep apnea, though he finds the face mask uncomfortable and has not fully adjusted to it.   He denies any thoughts of hurting himself or others.  He reports nerve damage in his right leg following surgery 5 years ago.  He is currently on baclofen prescribed by his provider for management of the same.    Visit Diagnosis:    ICD-10-CM   1. Recurrent major depressive disorder, in full  remission  F33.42     2. GAD (generalized anxiety disorder)  F41.1     3. Insomnia due to medical condition  G47.01    Depression, OSA on CPAP      Past Psychiatric History: I have reviewed past psychiatric history from progress note on 12/16/2017.  Past Medical History:  Past Medical History:  Diagnosis Date   Arthritis    Coronary artery disease    Depression    Hyperlipidemia    Skin cancer    left forearm treated years ago by Dr. Oneil Rolling    Past Surgical History:  Procedure Laterality Date   ANKLE FRACTURE SURGERY Right    ARTHRODESIS ANT INTERBODY INC DISCECTOMY, CERVICAL BELOW C2    05/2019   CARDIAC CATHETERIZATION N/A 04/26/2015   Procedure: Left Heart Cath;  Surgeon: Denyse DELENA Bathe, MD;  Location: [facility] INVASIVE CV LAB;  Service: Cardiovascular;  Laterality: N/A;   CLAVICLE SURGERY Right 05/04/2021   CORONARY ANGIOPLASTY     CORONARY STENT INTERVENTION N/A 03/27/2018   Procedure: CORONARY STENT INTERVENTION;  Surgeon: Florencio Cara BIRCH, MD;  Location: [facility] INVASIVE CV LAB;  Service: Cardiovascular;  Laterality: N/A;   EYE SURGERY Bilateral    cataract   FOOT SURGERY Left 08/22/2022   FOOT SURGERY Right 10/31/2022   LEFT HEART CATH AND CORONARY ANGIOGRAPHY Left 03/27/2018   Procedure: Left Heart Cath with possible coronary intervention;  Surgeon: Bathe Denyse DELENA, MD;  Location: [facility] INVASIVE CV LAB;  Service: Cardiovascular;  Laterality: Left;   LUMBAR FUSION  05/08/2022   TOTAL KNEE ARTHROPLASTY Right 10/08/2018   Procedure: TOTAL KNEE ARTHROPLASTY;  Surgeon: Leora Lynwood SAUNDERS, MD;  Location: [facility] ORS;  Service: Orthopedics;  Laterality: Right;    Family Psychiatric History: Reviewed family psychiatric history from progress note on 12/16/2017.  Family History:  Family History  Problem Relation Age of Onset   Diabetes Mother    Hypertension Mother    Cancer Father    Depression Father    Anxiety disorder Father    Depression Daughter     Social History:  I have reviewed social history from progress note on 12/16/2017. Social History   Socioeconomic History   Marital status: Married    Spouse name: ann   Number of children: 2   Years of education: Not on file   Highest education level: Master's degree (e.g., MA, MS, MEng, MEd, MSW, MBA)  Occupational History    Comment: retired  Tobacco Use   Smoking status: Former    Types: Cigars, Cigarettes    Quit date: 07/18/2006    Years since quitting: 18.2   Smokeless tobacco: Never  Vaping Use   Vaping status: Never Used  Substance and Sexual Activity   Alcohol use: No   Drug use: No   Sexual activity: Not Currently  Other Topics Concern   Not on file  Social History Narrative   Not on file   Social Drivers of Health   Tobacco Use: Medium Risk (09/28/2024)   Patient History    Smoking Tobacco Use: Former    Smokeless Tobacco Use: Never    Passive Exposure: Not on Actuary Strain: Low Risk (05/30/2024)   Overall Financial Resource Strain (CARDIA)    Difficulty of Paying Living Expenses: Not hard at all  Food Insecurity: No Food Insecurity (05/30/2024)   Epic    Worried About Programme Researcher, Broadcasting/film/video in the Last Year: Never true    Ran Out of Food in the Last Year: Never true  Transportation Needs: No Transportation Needs (05/30/2024)   Epic    Lack of Transportation (Medical): No    Lack of Transportation (Non-Medical): No  Physical Activity: Sufficiently Active (05/30/2024)   Exercise Vital Sign    Days of Exercise per Week: 5 days    Minutes of Exercise per Session: 150+ min  Stress: Stress Concern Present (05/30/2024)   [employer] of Occupational Health - Occupational Stress Questionnaire    Feeling of Stress: Rather much  Social Connections: Socially Integrated (05/30/2024)   Social Connection and Isolation Panel    Frequency of Communication with Friends and Family: Twice a week    Frequency of Social Gatherings with Friends and Family: Once a week     Attends Religious Services: More than 4 times per year    Active Member of Clubs or Organizations: Yes    Attends Banker Meetings: More than 4 times per year    Marital Status: Married  Depression (PHQ2-9): Low Risk (09/28/2024)   Depression (PHQ2-9)    PHQ-2 Score: 1  Alcohol Screen: Low Risk (11/28/2023)   Alcohol Screen    Last Alcohol Screening Score (AUDIT): 0  Housing: Unknown (08/15/2024)   Received from [facility]   Epic    Unable to Pay for Housing in the Last Year: Not on file    Number of Times Moved in the Last Year: Not on file    At any time in the past 12 months, were you homeless or living  in a shelter (including now)?: No  Utilities: Not At Risk (11/28/2023)   AHC Utilities    Threatened with loss of utilities: No  Health Literacy: Adequate Health Literacy (11/28/2023)   B1300 Health Literacy    Frequency of need for help with medical instructions: Never    Allergies: Allergies[1]  Metabolic Disorder Labs: Lab Results  Component Value Date   HGBA1C 6.1 (H) 05/01/2022   No results found for: PROLACTIN Lab Results  Component Value Date   CHOL 90 (L) 06/02/2024   TRIG 103 06/02/2024   HDL 38 (L) 06/02/2024   CHOLHDL 2.5 08/20/2019   VLDL 37 03/28/2018   LDLCALC 32 06/02/2024   LDLCALC 27 11/29/2023   Lab Results  Component Value Date   TSH 1.320 11/29/2023   TSH 1.120 11/26/2022    Therapeutic Level Labs: No results found for: LITHIUM No results found for: VALPROATE No results found for: CBMZ  Current Medications: Current Outpatient Medications  Medication Sig Dispense Refill   baclofen (LIORESAL) 10 MG tablet Take 10 mg by mouth 3 (three) times daily.     acetaminophen  (TYLENOL ) 500 MG tablet Take by mouth.     aspirin  81 MG EC tablet Take 1 tablet (81 mg total) by mouth daily. 90 tablet 3   buPROPion  (WELLBUTRIN  XL) 300 MG 24 hr tablet TAKE ONE TABLET BY MOUTH EVERY MORNING STOP 150MG  DOSE INCREASE 90  tablet 3   chlorthalidone  (HYGROTON ) 25 MG tablet Take 1 tablet (25 mg total) by mouth daily. 90 tablet 0   Cholecalciferol  125 MCG (5000 UT) capsule Take 5,000 Units by mouth daily.     clobetasol (TEMOVATE) 0.05 % GEL Apply topically.     clopidogrel  (PLAVIX ) 75 MG tablet Take 1 tablet (75 mg total) by mouth daily. 90 tablet 1   clotrimazole -betamethasone  (LOTRISONE ) cream Apply 1 application topically 2 (two) times daily. 30 g 0   dexamethasone  (DECADRON ) 0.5 MG/5ML solution      diclofenac Sodium (VOLTAREN) 1 % GEL      DULoxetine  (CYMBALTA ) 20 MG capsule TAKE 1 CAPSULE BY MOUTH EVERY DAY ALONG WITH 60MG  FOR TOTAL OF 80MG  90 capsule 3   DULoxetine  (CYMBALTA ) 60 MG capsule TAKE 1 CAPSULE BY MOUTH DAILY ALONG WITHTHE 20MG  FOR A TOTAL OF 80MG  90 capsule 3   dutasteride  (AVODART ) 0.5 MG capsule TAKE 1 CAPSULE BY MOUTH EVERY DAY IN THEEVENING 90 capsule 1   fluticasone  (FLONASE ) 50 MCG/ACT nasal spray Place 2 sprays into both nostrils daily. 16 g 12   hydrocortisone  (ANUSOL -HC) 2.5 % rectal cream APPLY RECTALLY TWICE DAILY AS DIRECTED 30 g 0   hydrOXYzine  (VISTARIL ) 25 MG capsule Take 1 capsule (25 mg total) by mouth 2 (two) times daily as needed for anxiety. 60 capsule 1   icosapent  Ethyl (VASCEPA ) 1 g capsule Take 2 capsules (2 g total) by mouth 2 (two) times daily. 360 capsule 1   losartan  (COZAAR ) 50 MG tablet Take 1 tablet (50 mg total) by mouth daily. 90 tablet 1   mupirocin  ointment (BACTROBAN ) 2 % Apply 1 Application topically daily. 22 g 0   niacin  (NIASPAN ) 1000 MG CR tablet Take 2 tablets (2,000 mg total) by mouth at bedtime. 180 tablet 2   pantoprazole  (PROTONIX ) 20 MG tablet Take 1 tablet (20 mg total) by mouth daily. 90 tablet 1   rosuvastatin  (CRESTOR ) 40 MG tablet Take 1 tablet (40 mg total) by mouth every evening. 90 tablet 1   spironolactone  (ALDACTONE ) 25 MG tablet Take 1  tablet (25 mg total) by mouth daily. 90 tablet 1   tamsulosin  (FLOMAX ) 0.4 MG CAPS capsule Take 1 capsule  (0.4 mg total) by mouth daily. 90 capsule 1   testosterone  cypionate (DEPOTESTOSTERONE CYPIONATE) 200 MG/ML injection INJECT 0.75 ML INTRAMUSCULARLY EVERY 10 DAYS 10 mL 0   vitamin B-12 (CYANOCOBALAMIN ) 1000 MCG tablet Take 1,000 mcg by mouth daily.     No current facility-administered medications for this visit.     Musculoskeletal: Strength & Muscle Tone: within normal limits Gait & Station: slow Patient leans: N/A  Psychiatric Specialty Exam: Review of Systems  Psychiatric/Behavioral:  The patient is nervous/anxious.     Blood pressure 128/82, pulse (!) 105, temperature 97.8 F (36.6 C), temperature source Temporal, height 6' 1 (1.854 m), weight 194 lb 6.4 oz (88.2 kg).Body mass index is 25.65 kg/m.  General Appearance: Casual  Eye Contact:  Fair  Speech:  Clear and Coherent  Volume:  Normal  Mood:  Anxious  Affect:  Congruent  Thought Process:  Goal Directed and Descriptions of Associations: Intact  Orientation:  Full (Time, Place, and Person)  Thought Content: Logical   Suicidal Thoughts:  No  Homicidal Thoughts:  No  Memory:  Immediate;   Fair Recent;   Fair Remote;   Fair  Judgement:  Intact  Insight:  Fair  Psychomotor Activity:  Normal  Concentration:  Concentration: Fair and Attention Span: Fair  Recall:  [employer] of Knowledge: Fair  Language: Fair  Akathisia:  No  Handed:  Right  AIMS (if indicated): not done  Assets:  Manufacturing Systems Engineer Desire for Improvement Housing Social Support Transportation  ADL's:  Intact  Cognition: WNL  Sleep:  Fair   Screenings: Geneticist, Molecular Office Visit from 04/09/2022 in [facility] Office Visit from 02/06/2022 in [facility]  AIMS Total Score 0 0   GAD-7    Flowsheet Row Office Visit from 09/28/2024 in [facility] Office Visit from 06/03/2024 in [facility] Office Visit from 06/02/2024 in [facility] Office Visit from 12/30/2023 in [facility] Office Visit from 11/28/2023 in [facility]  Total GAD-7 Score 9 8 9 12 7    PHQ2-9    Flowsheet Row Office Visit from 09/28/2024 in [facility] Office Visit from 06/03/2024 in [facility] Office Visit from 06/02/2024 in [facility] Office Visit from 12/30/2023 in [facility] Office Visit from 11/28/2023 in [facility]  PHQ-2 Total Score 1 2 2 4 2   PHQ-9 Total Score -- 9 9 13 7    Flowsheet Row Office Visit from 09/28/2024 in [facility] Office Visit from 06/03/2024 in [facility] Video Visit from 02/11/2024 in [facility]  C-SSRS RISK CATEGORY No Risk No Risk No Risk     Assessment and Plan: Kenneth Pruitt is a 74 year old Caucasian male who has a history of MDD, GAD, multiple medical problems was evaluated in office today for a follow-up appointment.  Discussed assessment and plan as noted below.  1. Recurrent major depressive disorder, in full remission Currently denies any significant depression symptoms Continue Wellbutrin  300 mg daily Continue Cymbalta  80 mg daily  2. GAD (generalized anxiety disorder)-unstable On going anxiety  symptoms mostly situational.  Agreeable to referral to psychotherapist. Encouraged to start CBT Communicated with staff to schedule this patient with in-house therapist Continue Cymbalta  80 mg daily Continue Hydroxyzine  25 mg twice a day as needed  3. Insomnia due to medical condition-stable Although sleep is interrupted due to frequent urination at night overall sleep is  good. Encouraged to continue CPAP therapy for sleep apnea Continue sleep hygiene techniques  Follow-up Follow-up in clinic in 4 to 5 months or sooner if needed.    Collaboration of Care: Collaboration of Care: Referral or follow-up with counselor/therapist AEB patient referred for CBT communicated with staff  Patient/Guardian was advised Release of Information must be obtained prior to any record release in order to collaborate their care with an outside provider. Patient/Guardian was advised if they have not already done so to contact the registration department to sign all necessary forms in order for us  to release information regarding their care.   Consent: Patient/Guardian gives verbal consent for treatment and assignment of benefits for services provided during this visit. Patient/Guardian expressed understanding and agreed to proceed.  This note was generated in part or whole with voice recognition software. Voice recognition is usually quite accurate but there are transcription errors that can and very often do occur. I apologize for any typographical errors that were not detected and corrected.     Britni Driscoll, MD 09/28/2024, 11:45 AM     [1]  Allergies Allergen Reactions   Clonidine  And Derivatives Other (See Comments) and Shortness Of Breath    Low heart rate  Low heart rate    Mild bradycardia + Somnolence    Low heart rate Mild bradycardia + Somnolence Mild bradycardia + Somnolence    Low heart rate Mild bradycardia + Somnolence Mild bradycardia + Somnolence Mild bradycardia + Somnolence Mild bradycardia + Somnolence  clonidine    Clonidine      Mild bradycardia + Somnolence

## 2024-09-29 ENCOUNTER — Ambulatory Visit: Admitting: Dermatology

## 2024-09-29 ENCOUNTER — Ambulatory Visit

## 2024-09-29 ENCOUNTER — Encounter: Payer: Self-pay | Admitting: Dermatology

## 2024-09-29 DIAGNOSIS — L438 Other lichen planus: Secondary | ICD-10-CM

## 2024-09-29 DIAGNOSIS — L570 Actinic keratosis: Secondary | ICD-10-CM | POA: Diagnosis not present

## 2024-09-29 DIAGNOSIS — L820 Inflamed seborrheic keratosis: Secondary | ICD-10-CM | POA: Diagnosis not present

## 2024-09-29 DIAGNOSIS — Z7189 Other specified counseling: Secondary | ICD-10-CM

## 2024-09-29 DIAGNOSIS — L729 Follicular cyst of the skin and subcutaneous tissue, unspecified: Secondary | ICD-10-CM

## 2024-09-29 DIAGNOSIS — Z1283 Encounter for screening for malignant neoplasm of skin: Secondary | ICD-10-CM

## 2024-09-29 DIAGNOSIS — L821 Other seborrheic keratosis: Secondary | ICD-10-CM

## 2024-09-29 DIAGNOSIS — W908XXA Exposure to other nonionizing radiation, initial encounter: Secondary | ICD-10-CM

## 2024-09-29 DIAGNOSIS — L720 Epidermal cyst: Secondary | ICD-10-CM | POA: Diagnosis not present

## 2024-09-29 DIAGNOSIS — R2681 Unsteadiness on feet: Secondary | ICD-10-CM

## 2024-09-29 DIAGNOSIS — Z85828 Personal history of other malignant neoplasm of skin: Secondary | ICD-10-CM

## 2024-09-29 DIAGNOSIS — D1801 Hemangioma of skin and subcutaneous tissue: Secondary | ICD-10-CM

## 2024-09-29 DIAGNOSIS — Z79899 Other long term (current) drug therapy: Secondary | ICD-10-CM

## 2024-09-29 DIAGNOSIS — L578 Other skin changes due to chronic exposure to nonionizing radiation: Secondary | ICD-10-CM | POA: Diagnosis not present

## 2024-09-29 DIAGNOSIS — M6281 Muscle weakness (generalized): Secondary | ICD-10-CM

## 2024-09-29 DIAGNOSIS — Q828 Other specified congenital malformations of skin: Secondary | ICD-10-CM | POA: Diagnosis not present

## 2024-09-29 NOTE — Patient Instructions (Addendum)

## 2024-09-29 NOTE — Progress Notes (Signed)
 "  Follow-Up Visit   Subjective  Kenneth Pruitt is a 74 y.o. male who presents for the following: Skin Cancer Screening and Full Body Skin Exam hx of Skin Cancer L arm, hx of Aks, check spot L arm at elbow, 6-44m, getting larger, itchy prn pt picks at, hx of oral Lichen Planus, pt currently using something Dr. Sulema (oral surgeon) prescribed, would like to f/u here due to Dr. Glinda oral medication specialist, travels across [facility]  The patient presents for Total-Body Skin Exam (TBSE) for skin cancer screening and mole check. The patient has spots, moles and lesions to be evaluated, some may be new or changing and the patient may have concern these could be cancer.  The following portions of the chart were reviewed this encounter and updated as appropriate: medications, allergies, medical history  Review of Systems:  No other skin or systemic complaints except as noted in HPI or Assessment and Plan.  Objective  Well appearing patient in no apparent distress; mood and affect are within normal limits.  A full examination was performed including scalp, head, eyes, ears, nose, lips, neck, chest, axillae, abdomen, back, buttocks, bilateral upper extremities, bilateral lower extremities, hands, feet, fingers, toes, fingernails, and toenails. All findings within normal limits unless otherwise noted below.   Relevant physical exam findings are noted in the Assessment and Plan.  Left Forearm x 9, R forearm x 7 (16) Stuck on waxy paps with erythema R temple x 1, R ear x 1 (2) Pink scaly macules   R pretibial   Mouth     Assessment & Plan   SKIN CANCER SCREENING PERFORMED TODAY.  ACTINIC DAMAGE - Chronic condition, secondary to cumulative UV/sun exposure - diffuse scaly erythematous macules with underlying dyspigmentation - Recommend daily broad spectrum sunscreen SPF 30+ to sun-exposed areas, reapply every 2 hours as needed.  - Staying in the shade or wearing long sleeves, sun glasses  (UVA+UVB protection) and wide brim hats (4-inch brim around the entire circumference of the hat) are also recommended for sun protection.  - Call for new or changing lesions.  LENTIGINES, SEBORRHEIC KERATOSES, HEMANGIOMAS - Benign normal skin lesions - Benign-appearing - Call for any changes  MELANOCYTIC NEVI - Tan-brown and/or pink-flesh-colored symmetric macules and papules - Benign appearing on exam today - Observation - Call clinic for new or changing moles - Recommend daily use of broad spectrum spf 30+ sunscreen to sun-exposed areas.   HISTORY OF SKIN CANCER - Clear. Observe for recurrence.  - Call clinic for new or changing lesions.   - Recommend regular skin exams, daily broad-spectrum spf 30+ sunscreen use, and photoprotection.    - L arm, txted in past by Dr. 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 Condition is symptomatic / bothersome to patient. Not to goal. Treatment Plan: Patient will send Mychart message letting us  know what current medication he is using for Lichen Planus May consider Tacrolimus solution  EPIDERMAL INCLUSION CYST Upper back x 4 Exam: Subcutaneous nodule at upper back x 4, 0.5cm x 3, 1.0cm x 1 Benign-appearing. Exam most consistent with an epidermal inclusion cyst. Discussed that a cyst is a benign growth that can grow over time and sometimes get irritated or inflamed. Recommend observation if it is not bothersome. Discussed option of surgical excision to remove it if it is growing, symptomatic, or other changes noted. Please call for  new or changing lesions so they can be evaluated.  POROKERATOSIS R pretibial Present for many years without change Exam: Annular pink pathc with elevated edge 1.0ccm Treatment Plan: Benign-appearing.  Observation.  Call clinic for new or changing lesions.  Recommend daily use of  broad spectrum spf 30+ sunscreen to sun-exposed areas.     INFLAMED SEBORRHEIC KERATOSIS (16) Left Forearm x 9, R forearm x 7 (16) With [facility] and Prurigo Nodules Symptomatic, irritating, patient would like treated. Avoid picking  Lichen simplex chronicus [facility]) is a persistent itchy area of thickened skin that is induced by chronic rubbing and/or scratching (chronic dermatitis).  These areas may be pink, hyperpigmented and may have excoriations.  LSC is commonly observed in uncontrolled atopic dermatitis and other forms of eczema, and in other itchy skin conditions (eg, insect bites, scabies).  Sometimes it is not possible to know initial cause of LSC if it has been present for a long time.  It generally responds well to treatment with high potency topical steroids.  It is important to stop rubbing/scratching the area in order to break the itch-scratch-rash-itch cycle, in order for the rash to resolve.  - Destruction of lesion - Left Forearm x 9, R forearm x 7 (16) Complexity: simple   Destruction method: cryotherapy   Informed consent: discussed and consent obtained   Timeout:  patient name, date of birth, surgical site, and procedure verified Lesion destroyed using liquid nitrogen: Yes   Region frozen until ice ball extended beyond lesion: Yes   Outcome: patient tolerated procedure well with no complications   Post-procedure details: wound care instructions given    AK (ACTINIC KERATOSIS) (2) R temple x 1, R ear x 1 (2) Recheck on f/u  Patient will send mychart message in 2 months if not resolved  Actinic keratoses are precancerous spots that appear secondary to cumulative UV radiation exposure/sun exposure over time. They are chronic with expected duration over 1 year. A portion of actinic keratoses will progress to squamous cell carcinoma of the skin. It is not possible to reliably predict which spots will progress to skin cancer and so treatment is recommended to prevent development of  skin cancer.  Recommend daily broad spectrum sunscreen SPF 30+ to sun-exposed areas, reapply every 2 hours as needed.  Recommend staying in the shade or wearing long sleeves, sun glasses (UVA+UVB protection) and wide brim hats (4-inch brim around the entire circumference of the hat). Call for new or changing lesions. - Destruction of lesion - R temple x 1, R ear x 1 (2) Complexity: simple   Destruction method: cryotherapy   Informed consent: discussed and consent obtained   Timeout:  patient name, date of birth, surgical site, and procedure verified Lesion destroyed using liquid nitrogen: Yes   Region frozen until ice ball extended beyond lesion: Yes   Outcome: patient tolerated procedure well with no complications   Post-procedure details: wound care instructions given    Return in about 6 months (around 03/29/2025) for Lichen Planus f/u, AK f/u.  I, Grayce Saunas, RMA, am acting as scribe for Alm Rhyme, MD .   Documentation: I have reviewed the above documentation for accuracy and completeness, and I agree with the above.  Alm Rhyme, MD    "

## 2024-09-29 NOTE — Therapy (Signed)
 " OUTPATIENT PHYSICAL THERAPY BALANCE TREATMENT   Patient Name: Kenneth Pruitt MRN: [id number] DOB:09-14-1951, 74 y.o., male Today's Date: 09/29/2024  END OF SESSION:  PT End of Session - 09/29/24 1401     Visit Number 2    Number of Visits 17    Date for Recertification  11/19/24    Authorization Type eval: 09/24/24    PT Start Time 1402    PT Stop Time 1445    PT Time Calculation (min) 43 min    Equipment Utilized During Treatment Gait belt    Activity Tolerance Patient tolerated treatment well    Behavior During Therapy WFL for tasks assessed/performed          Past Medical History:  Diagnosis Date   Actinic keratosis    Arthritis    Coronary artery disease    Depression    Hyperlipidemia    Skin cancer    left forearm treated years ago by Dr. Oneil Rolling   Past Surgical History:  Procedure Laterality Date   ANKLE FRACTURE SURGERY Right    ARTHRODESIS ANT INTERBODY INC DISCECTOMY, CERVICAL BELOW C2    05/2019   CARDIAC CATHETERIZATION N/A 04/26/2015   Procedure: Left Heart Cath;  Surgeon: Denyse DELENA Bathe, MD;  Location: [facility] INVASIVE CV [facility];  Service: Cardiovascular;  Laterality: N/A;   CLAVICLE SURGERY Right 05/04/2021   CORONARY ANGIOPLASTY     CORONARY STENT INTERVENTION N/A 03/27/2018   Procedure: CORONARY STENT INTERVENTION;  Surgeon: Florencio Cara BIRCH, MD;  Location: [facility] INV[facility]IVE CV [facility];  Service: Cardiovascular;  Laterality: N/A;   EYE SURGERY Bilateral    cataract   FOOT SURGERY Left 08/22/2022   FOOT SURGERY Right 10/31/2022   LEFT HEART CATH AND CORONARY ANGIOGRAPHY Left 03/27/2018   Procedure: Left Heart Cath with possible coronary intervention;  Surgeon: Bathe Denyse DELENA, MD;  Location: [facility] INVASIVE CV LAB;  Service: Cardiovascular;  Laterality: Left;   LUMBAR FUSION  05/08/2022   TOTAL KNEE ARTHROPLASTY Right 10/08/2018   Procedure: TOTAL KNEE ARTHROPLASTY;  Surgeon: Leora Lynwood SAUNDERS, MD;  Location: [facility] ORS;  Service: Orthopedics;  Laterality:  Right;   Patient Active Problem List   Diagnosis Date Noted   Recurrent major depressive disorder, in partial remission 02/11/2024   Proteinuria 01/29/2024   Senile purpura 11/28/2023   History of cardiac catheterization 07/16/2022   Fracture of clavicle 07/16/2022   Postoperative retention of urine 07/16/2022   Hammer toe 06/04/2022   Foot pain 06/04/2022   Acquired hallux rigidus 06/04/2022   Contusion of buttock 01/15/2022   S/P cervical spinal fusion 12/12/2021   Neuropathic pain, leg, right 12/12/2021   Insomnia due to medical condition 11/14/2021   Clavicle pain 09/26/2021   Lumbar radiculopathy 08/31/2021   Family history of prostate cancer 06/08/2020   MDD (major depressive disorder), recurrent, in full remission 12/31/2019   Bereavement 12/31/2019   GAD (generalized anxiety disorder) 08/05/2019   Insomnia due to mental disorder 08/05/2019   Orthostasis 05/28/2019   Allergic rhinitis 05/26/2019   OSA on CPAP 05/19/2019   Chronic kidney disease (CKD), stage III (moderate) (HCC) 05/19/2019   Weakness of right leg 05/10/2019   Weakness of right arm 05/10/2019   S/P TKR (total knee replacement) using cement, right 10/08/2018   Hypokalemia 09/30/2018   CAD (coronary artery disease) 03/27/2018   Unstable angina (HCC) 03/24/2018   Skin lesions 10/16/2017   BPH (benign prostatic hyperplasia) 10/16/2017   Advanced care planning/counseling discussion 02/05/2017   Polycythemia, secondary 08/28/2016  Knee pain, right 08/06/2016   Elevated PSA 08/06/2016   Hypogonadism in male 11/17/2015   Hyperlipidemia 11/17/2015   Essential hypertension 11/17/2015   Erectile dysfunction 03/07/2015   Arthritis of knee 12/18/2011   Primary localized osteoarthrosis, lower leg 11/21/2011   PCP: Vicci Duwaine SQUIBB, DO  REFERRING PROVIDER: Marjorie Leisure, MD   REFERRING DIAG:  G95.9 (ICD-10-CM) - Myelopathy (HCC)  [phone] (ICD-10-CM) - Hip flexor tendinitis   RATIONALE FOR EVALUATION  AND TREATMENT: Rehabilitation  THERAPY DIAG: Unsteadiness on feet  Muscle weakness (generalized)  ONSET DATE: 2020  FOLLOW-UP APPT SCHEDULED WITH REFERRING PROVIDER: No   FROM INITIAL EVALUATION SUBJECTIVE:                                                                                                                                                                                         SUBJECTIVE STATEMENT:  RLE weakness and unsteadiness  PERTINENT HISTORY:  Pt states that he has been active for multiple years with triathalons and long distance cycling. In the summer of 2020 he began falling and losing and his balance. He was referred to [facility] neurosurgery and saw Dr. 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 He states his UE strength improved after the surgery but his RLE strength never improved. He is unsteady, drags his RLE occasionally, and wobbles. Pt reports >20 falls in the last 6 months. He underwent PT 3-4 years ago without significant improvement in his RLE strength. Pt also has a hx of a lower lumbar fusion due to compression on L5 and he still gets some pain from the R lateral lower leg to the top of the foot.   Pain: Yes, R lower leg to top of the foot Numbness/Tingling: Yes, tingling and numbness in the R lower leg and foot Focal Weakness: Yes, R hip flexor weakness and R dorsiflexor weakness; Recent changes in overall health/medication: No Prior history of physical therapy for balance:  Yes, after cervical fusion; Dominant hand: right Imaging: Yes, see history; Red flags: Negative for bowel/bladder changes, saddle paresthesia, h/o compression fx, h/o abdominal aneurysm, abdominal pain, chills/fever, night sweats, nausea, vomiting, and unexplained weight gain/loss.  PRECAUTIONS: None  WEIGHT BEARING RESTRICTIONS: No  FALLS: Has patient fallen in last 6 months? Yes. Number of falls >20 falls,   Living  Environment Lives with: lives with their spouse Lives in: House/apartment, townhouse Stairs: No  Prior level of function: Independent  Occupational demands: Retired financial planner  Hobbies: Cycling  Patient Goals: Increase RLE strength   OBJECTIVE:   Patient Surveys  ABC: To be completed LEFS: To  be completed  Cognition Patient is oriented to person, place, and time.  Recent memory is intact.  Remote memory is intact.  Attention span and concentration are intact.  Expressive speech is intact.  Patient's fund of knowledge is within normal limits for educational level.    Gross Musculoskeletal Assessment Tremor: None Bulk: Normal Tone: Not tested  Posture: Deferred  AROM Deferred  LE MMT: MMT (out of 5) Right  Left   Hip flexion 4 5  Hip extension 3+ 3+  Hip abduction 3+ 4  Hip adduction 4+ 5  Hip internal rotation 4+ 5  Hip external rotation 4+ 4+  Knee flexion 5 4  Knee extension 5 5  Ankle dorsiflexion 4+ 4+  Ankle plantarflexion 10 reps 10 reps  Ankle inversion 5 5  Ankle eversion 5 5  (* = pain; Blank rows = not tested)  Sensation Deferred  Reflexes Deferred  Cranial Nerves Deferred  Coordination/Cerebellar Deferred  Bed mobility: Deferred  Transfers: Assistive device utilized: None  Sit to stand: Complete Independence Stand to sit: Complete Independence Chair to chair: Complete Independence Floor: Deferred  Curb:  Deferred  Stairs: Level of Assistance: Modified independence Stair Negotiation Technique: Alternating Pattern  with Bilateral Rails Number of Stairs: 4 x 2   Height of Stairs: 6  Comments: Mild instability noted requiring rail assistance but no overt LOB requiring external assistance  Gait: Full gait assessment deferred, some mild instability noted but easily self-corrected;  Functional Outcome Measures  Results Comments  BERG    DGI    FGA    TUG    5TSTS    6 Minute Walk Test    10 Meter Gait Speed     (Blank rows = not tested)   TODAY'S TREATMENT    SUBJECTIVE: Pt reports that he is doing well today. No changes since the initial evaluation. Denies resting pain. No specific questions or concerns.    PAIN: Denies  Ther-ex  Supine R SLR with 3# AW x 15, 4# AW 2 x 15; Hooklying bridges x 5, DC secondary to HS cramping; Hooklying glute sets x 5, pt struggles to activate glutes without hamstrings; HEP issued and reviewed;   Neuromuscular Re-education  ABC: 68.1% LEFS: 45/80; BERG: 52/56; DGI: 22/24; 5TSTS: 11.6s;  Reflexes R/L Knee Jerk (L3/4): 2+/2+  Ankle Jerk (S1/2): 1+/1+   Sensation Grossly diminished throughout RLE light touch throughout as determined by testing dermatomes L2-S1. Proprioception, stereognosis, and hot/cold testing deferred on this date.   PATIENT EDUCATION:  Education details: Plan of care, additional examination results, exercise form/technique, and HEP; Person educated: Patient Education method: Programmer, Multimedia, Demonstration, Verbal cues, and Handouts Education comprehension: verbalized understanding, returned demonstration, and needs further education   HOME EXERCISE PROGRAM:  Access Code: [email or web address] URL: [email or web address] Date: 09/29/2024 Prepared by: Selinda Eck  Exercises - Seated Gluteal Sets  - 1 x daily - 7 x weekly - 3 sets - 10 reps - 3s hold - Supine Straight Leg Raises (Mirrored)  - 1 x daily - 7 x weekly - 3 sets - 10 reps - 3s hold - Side Leg Lifts (Mirrored)  - 1 x daily - 7 x weekly - 3 sets - 10 reps - 3s hold   ASSESSMENT:  CLINICAL IMPRESSION: Performed additional examination items during session today. LEFS is 45/80 and ABC is 68.1%. Balance testing demonstrates mild deficits with pt scoring 52/56 on the BERG and 22/24 on the DGI. He might benefit from a higher level test like the Mini  BESTest to tease out his deficits but most of his falls are related to RLE weakness/buckling. Initiated RLE strength  exercises during session today with patient. Issued HEP and reviewed with patient. Pt encouraged to follow-up as scheduled. He will benefit from PT services to address deficits in strength, balance, and mobility in order to improve function at home and decrease his risk for falls.    OBJECTIVE IMPAIRMENTS: decreased balance, difficulty walking, and decreased strength.   ACTIVITY LIMITATIONS: squatting and stairs  PARTICIPATION LIMITATIONS: community activity and cycling  PERSONAL FACTORS: Age, Time since onset of injury/illness/exacerbation, and 3+ comorbidities: Depression, OA, GAD, and neuropathic pain are also affecting patient's functional outcome.   REHAB POTENTIAL: Fair    CLINICAL DECISION MAKING: Evolving/moderate complexity  EVALUATION COMPLEXITY: Moderate   GOALS: Goals reviewed with patient? No  SHORT TERM GOALS: Target date: 10/22/2024  Pt will be independent with HEP in order to improve strength and balance to decrease fall risk and improve function at home and with leisure activities such as cycling Baseline:  Goal status: INITIAL   LONG TERM GOALS: Target date: 11/19/2024  Pt will increase LEFS by at least 9 points in order to demonstrate significant improvement in lower extremity function.   Baseline: 09/29/24: 45/80 Goal status: INITIAL  2.  Pt will improve BERG by at least 3 points in order to demonstrate clinically significant improvement in balance.   Baseline: 09/29/24: 52/56 Goal status: DISCONTINUED  3.  Pt will improve ABC by at least 13% in order to demonstrate clinically significant improvement in balance confidence.      Baseline: 09/29/24: 68.1% Goal status: INITIAL  4.  Pt will increase strength of R hip flexion and abduction to at least 4+/5 grade in order to demonstrate improvement in strength and function.     Baseline: flexion: 4/5, abduction: 3+/5; Goal status: INITIAL   PLAN: PT FREQUENCY: 1-2x/week  PT DURATION: 8 weeks  PLANNED  INTERVENTIONS: Therapeutic exercises, Therapeutic activity, Neuromuscular re-education, Balance training, Gait training, Patient/Family education, Self Care, Joint mobilization, Joint manipulation, Vestibular training, Canalith repositioning, Orthotic/Fit training, DME instructions, Dry Needling, Electrical stimulation, Spinal manipulation, Spinal mobilization, Cryotherapy, Moist heat, Taping, Traction, Ultrasound, Ionotophoresis 4mg /ml Dexamethasone , Manual therapy, and Re-evaluation.  PLAN FOR NEXT SESSION: gait assessment, consider digital dynamometry and Mini BESTest, progress balance and strengthening;    Selinda BIRCH Julieth Tugman PT, DPT, GCS  Leovanni Bjorkman 09/29/2024, 5:59 PM  "

## 2024-10-01 ENCOUNTER — Ambulatory Visit

## 2024-10-01 DIAGNOSIS — M6281 Muscle weakness (generalized): Secondary | ICD-10-CM

## 2024-10-01 DIAGNOSIS — R2681 Unsteadiness on feet: Secondary | ICD-10-CM | POA: Diagnosis not present

## 2024-10-01 NOTE — Therapy (Signed)
 " OUTPATIENT PHYSICAL THERAPY BALANCE TREATMENT   Patient Name: Kenneth Pruitt MRN: [id number] DOB:1951/09/12, 74 y.o., male Today's Date: 10/01/2024  END OF SESSION:  PT End of Session - 10/01/24 1417     Visit Number 3    Number of Visits 17    Date for Recertification  11/19/24    Authorization Type eval: 09/24/24    PT Start Time 1410    PT Stop Time 1445    PT Time Calculation (min) 35 min    Equipment Utilized During Treatment Gait belt    Activity Tolerance Patient tolerated treatment well    Behavior During Therapy WFL for tasks assessed/performed          Past Medical History:  Diagnosis Date   Actinic keratosis    Arthritis    Coronary artery disease    Depression    Hyperlipidemia    Skin cancer    left forearm treated years ago by Dr. Oneil Rolling   Past Surgical History:  Procedure Laterality Date   ANKLE FRACTURE SURGERY Right    ARTHRODESIS ANT INTERBODY INC DISCECTOMY, CERVICAL BELOW C2    05/2019   CARDIAC CATHETERIZATION N/A 04/26/2015   Procedure: Left Heart Cath;  Surgeon: Denyse DELENA Bathe, MD;  Location: [facility]IVE [facility];  Service: Cardiovascular;  Laterality: N/A;   CLAVICLE SURGERY Right 05/04/2021   CORONARY ANGIOPLASTY     CORONARY STENT INTERVENTION N/A 03/27/2018   Procedure: CORONARY STENT INTERVENTION;  Surgeon: Florencio Cara BIRCH, MD;  Location: [facility];  Service: Cardiovascular;  Laterality: N/A;   EYE SURGERY Bilateral    cataract   FOOT SURGERY Left 08/22/2022   FOOT SURGERY Right 10/31/2022   LEFT HEART CATH AND CORONARY ANGIOGRAPHY Left 03/27/2018   Procedure: Left Heart Cath with possible coronary intervention;  Surgeon: Bathe Denyse DELENA, MD;  Location: [facility] INV[facility]IVE CV [facility];  Service: Cardiovascular;  Laterality: Left;   LUMBAR FUSION  05/08/2022   TOTAL KNEE ARTHROPLASTY Right 10/08/2018   Procedure: TOTAL KNEE ARTHROPLASTY;  Surgeon: Leora Lynwood SAUNDERS, MD;  Location: [facility] OR[facility];  Service: Orthopedics;  Laterality:  Right;   Patient Active Problem List   Diagnosis Date Noted   Recurrent major depressive disorder, in partial remission 02/11/2024   Proteinuria 01/29/2024   Senile purpura 11/28/2023   History of cardiac catheterization 07/16/2022   Fracture of clavicle 07/16/2022   Postoperative retention of urine 07/16/2022   Hammer toe 06/04/2022   Foot pain 06/04/2022   Acquired hallux rigidus 06/04/2022   Contusion of buttock 01/15/2022   S/P cervical spinal fusion 12/12/2021   Neuropathic pain, leg, right 12/12/2021   Insomnia due to medical condition 11/14/2021   Clavicle pain 09/26/2021   Lumbar radiculopathy 08/31/2021   Family history of prostate cancer 06/08/2020   MDD (major depressive disorder), recurrent, in full remission 12/31/2019   Bereavement 12/31/2019   GAD (generalized anxiety disorder) 08/05/2019   Insomnia due to mental disorder 08/05/2019   Orthostasis 05/28/2019   Allergic rhinitis 05/26/2019   OSA on CPAP 05/19/2019   Chronic kidney disease (CKD), stage III (moderate) (HCC) 05/19/2019   Weakness of right leg 05/10/2019   Weakness of right arm 05/10/2019   S/P TKR (total knee replacement) using cement, right 10/08/2018   Hypokalemia 09/30/2018   CAD (coronary artery disease) 03/27/2018   Unstable angina (HCC) 03/24/2018   Skin lesions 10/16/2017   BPH (benign prostatic hyperplasia) 10/16/2017   Advanced care planning/counseling discussion 02/05/2017   Polycythemia, secondary 08/28/2016  Knee pain, right 08/06/2016   Elevated PSA 08/06/2016   Hypogonadism in male 11/17/2015   Hyperlipidemia 11/17/2015   Essential hypertension 11/17/2015   Erectile dysfunction 03/07/2015   Arthritis of knee 12/18/2011   Primary localized osteoarthrosis, lower leg 11/21/2011   PCP: Vicci Duwaine SQUIBB, DO  REFERRING PROVIDER: Marjorie Leisure, MD   REFERRING DIAG:  G95.9 (ICD-10-CM) - Myelopathy (HCC)  [phone] (ICD-10-CM) - Hip flexor tendinitis   RATIONALE FOR EVALUATION  AND TREATMENT: Rehabilitation  THERAPY DIAG: Unsteadiness on feet  Muscle weakness (generalized)  ONSET DATE: 2020  FOLLOW-UP APPT SCHEDULED WITH REFERRING PROVIDER: No   FROM INITIAL EVALUATION SUBJECTIVE:                                                                                                                                                                                         SUBJECTIVE STATEMENT:  RLE weakness and unsteadiness  PERTINENT HISTORY:  Pt states that he has been active for multiple years with triathalons and long distance cycling. In the summer of 2020 he began falling and losing and his balance. He was referred to [facility] neurosurgery and saw Dr. 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 He states his UE strength improved after the surgery but his RLE strength never improved. He is unsteady, drags his RLE occasionally, and wobbles. Pt reports >20 falls in the last 6 months. He underwent PT 3-4 years ago without significant improvement in his RLE strength. Pt also has a hx of a lower lumbar fusion due to compression on L5 and he still gets some pain from the R lateral lower leg to the top of the foot.   Pain: Yes, R lower leg to top of the foot Numbness/Tingling: Yes, tingling and numbness in the R lower leg and foot Focal Weakness: Yes, R hip flexor weakness and R dorsiflexor weakness; Recent changes in overall health/medication: No Prior history of physical therapy for balance:  Yes, after cervical fusion; Dominant hand: right Imaging: Yes, see history; Red flags: Negative for bowel/bladder changes, saddle paresthesia, h/o compression fx, h/o abdominal aneurysm, abdominal pain, chills/fever, night sweats, nausea, vomiting, and unexplained weight gain/loss.  PRECAUTIONS: None  WEIGHT BEARING RESTRICTIONS: No  FALLS: Has patient fallen in last 6 months? Yes. Number of falls >20 falls,   Living  Environment Lives with: lives with their spouse Lives in: House/apartment, townhouse Stairs: No  Prior level of function: Independent  Occupational demands: Retired financial planner  Hobbies: Cycling  Patient Goals: Increase RLE strength   OBJECTIVE:   Patient Surveys  ABC: To be completed LEFS: To  be completed  Cognition Patient is oriented to person, place, and time.  Recent memory is intact.  Remote memory is intact.  Attention span and concentration are intact.  Expressive speech is intact.  Patient's fund of knowledge is within normal limits for educational level.    Gross Musculoskeletal Assessment Tremor: None Bulk: Normal Tone: Not tested  Posture: Deferred  AROM Deferred  LE MMT: MMT (out of 5) Right  Left   Hip flexion 4 5  Hip extension 3+ 3+  Hip abduction 3+ 4  Hip adduction 4+ 5  Hip internal rotation 4+ 5  Hip external rotation 4+ 4+  Knee flexion 5 4  Knee extension 5 5  Ankle dorsiflexion 4+ 4+  Ankle plantarflexion 10 reps 10 reps  Ankle inversion 5 5  Ankle eversion 5 5  (* = pain; Blank rows = not tested)  Reflexes R/L Knee Jerk (L3/4): 2+/2+  Ankle Jerk (S1/2): 1+/1+   Sensation Grossly diminished throughout RLE light touch throughout as determined by testing dermatomes L2-S1. Proprioception, stereognosis, and hot/cold testing deferred on this date.  Cranial Nerves Deferred  Coordination/Cerebellar Deferred  Bed mobility: Deferred  Transfers: Assistive device utilized: None  Sit to stand: Complete Independence Stand to sit: Complete Independence Chair to chair: Complete Independence Floor: Deferred  Curb:  Deferred  Stairs: Level of Assistance: Modified independence Stair Negotiation Technique: Alternating Pattern  with Bilateral Rails Number of Stairs: 4 x 2   Height of Stairs: 6  Comments: Mild instability noted requiring rail assistance but no overt LOB requiring external assistance  Gait: Full gait  assessment deferred, some mild instability noted but easily self-corrected;  Functional Outcome Measures  09/29/24 Comments  BERG 52/56   DGI    DGI 22/24   LEFS 45/80   5TSTS 11.6s   6 Minute Walk Test    ABC 68.1%   (Blank rows = not tested)   TODAY'S TREATMENT    SUBJECTIVE: Pt reports that he is doing well today. No changes since the last therapy session. No falls since the last therapy session. No specific questions or concerns.    PAIN: Denies   Therapeutic Activity  12 forward step-ups leading with RLE without UE support 2 x 10; Total Gym L22 double leg squats x 10; TG L22 single leg squats 2 x 10 BLE;   Ther-ex  Supine SLR with 5# AW 2 x 15 L sidelying R clams with manual resistance from therapist 2 x 10; L sidelying R hip straight leg abduction with manual resistance from therapist 2 x 10;   PATIENT EDUCATION:  Education details: Plan of care, additional examination results, exercise form/technique, and HEP; Person educated: Patient Education method: Programmer, Multimedia, Demonstration, Verbal cues, and Handouts Education comprehension: verbalized understanding, returned demonstration, and needs further education   HOME EXERCISE PROGRAM:  Access Code: [email or web address] URL: [email or web address] Date: 09/29/2024 Prepared by: Selinda Eck  Exercises - Seated Gluteal Sets  - 1 x daily - 7 x weekly - 3 sets - 10 reps - 3s hold - Supine Straight Leg Raises (Mirrored)  - 1 x daily - 7 x weekly - 3 sets - 10 reps - 3s hold - Side Leg Lifts (Mirrored)  - 1 x daily - 7 x weekly - 3 sets - 10 reps - 3s hold   ASSESSMENT:  CLINICAL IMPRESSION: Progressed RLE strength exercises during session today with patient. He has particular difficulty with sidelying R hip clams and straight leg abductions. No HEP HEP modifications at this time.  Pt encouraged to follow-up as scheduled. He will benefit from PT services to address deficits in strength, balance, and mobility in  order to improve function at home and decrease his risk for falls.    OBJECTIVE IMPAIRMENTS: decreased balance, difficulty walking, and decreased strength.   ACTIVITY LIMITATIONS: squatting and stairs  PARTICIPATION LIMITATIONS: community activity and cycling  PERSONAL FACTORS: Age, Time since onset of injury/illness/exacerbation, and 3+ comorbidities: Depression, OA, GAD, and neuropathic pain are also affecting patient's functional outcome.   REHAB POTENTIAL: Fair    CLINICAL DECISION MAKING: Evolving/moderate complexity  EVALUATION COMPLEXITY: Moderate   GOALS: Goals reviewed with patient? No  SHORT TERM GOALS: Target date: 10/22/2024  Pt will be independent with HEP in order to improve strength and balance to decrease fall risk and improve function at home and with leisure activities such as cycling Baseline:  Goal status: INITIAL   LONG TERM GOALS: Target date: 11/19/2024  Pt will increase LEFS by at least 9 points in order to demonstrate significant improvement in lower extremity function.   Baseline: 09/29/24: 45/80 Goal status: INITIAL  2.  Pt will improve BERG by at least 3 points in order to demonstrate clinically significant improvement in balance.   Baseline: 09/29/24: 52/56 Goal status: DISCONTINUED  3.  Pt will improve ABC by at least 13% in order to demonstrate clinically significant improvement in balance confidence.      Baseline: 09/29/24: 68.1% Goal status: INITIAL  4.  Pt will increase strength of R hip flexion and abduction to at least 4+/5 grade in order to demonstrate improvement in strength and function.     Baseline: flexion: 4/5, abduction: 3+/5; Goal status: INITIAL   PLAN: PT FREQUENCY: 1-2x/week  PT DURATION: 8 weeks  PLANNED INTERVENTIONS: Therapeutic exercises, Therapeutic activity, Neuromuscular re-education, Balance training, Gait training, Patient/Family education, Self Care, Joint mobilization, Joint manipulation, Vestibular training,  Canalith repositioning, Orthotic/Fit training, DME instructions, Dry Needling, Electrical stimulation, Spinal manipulation, Spinal mobilization, Cryotherapy, Moist heat, Taping, Traction, Ultrasound, Ionotophoresis 4mg /ml Dexamethasone , Manual therapy, and Re-evaluation.  PLAN FOR NEXT SESSION: gait assessment, consider digital dynamometry and Mini BESTest, progress balance and strengthening;    Selinda BIRCH Raykwon Hobbs PT, DPT, GCS  Roxsana Riding 10/01/2024, 4:12 PM  "

## 2024-10-04 NOTE — Therapy (Signed)
 " OUTPATIENT PHYSICAL THERAPY BALANCE TREATMENT   Patient Name: Kenneth Pruitt MRN: [id number] DOB:09-22-1950, 74 y.o., male Today's Date: 10/07/2024  END OF SESSION:  PT End of Session - 10/06/24 1413     Visit Number 4    Number of Visits 17    Date for Recertification  11/19/24    Authorization Type eval: 09/24/24    PT Start Time 1400    PT Stop Time 1445    PT Time Calculation (min) 45 min    Equipment Utilized During Treatment Gait belt    Activity Tolerance Patient tolerated treatment well    Behavior During Therapy WFL for tasks assessed/performed         Past Medical History:  Diagnosis Date   Actinic keratosis    Arthritis    Coronary artery disease    Depression    Hyperlipidemia    Skin cancer    left forearm treated years ago by Dr. Oneil Rolling   Past Surgical History:  Procedure Laterality Date   ANKLE FRACTURE SURGERY Right    ARTHRODESIS ANT INTERBODY INC DISCECTOMY, CERVICAL BELOW C2    05/2019   CARDIAC CATHETERIZATION N/A 04/26/2015   Procedure: Left Heart Cath;  Surgeon: Denyse DELENA Bathe, MD;  Location: [facility]IVE [facility];  Service: Cardiovascular;  Laterality: N/A;   CLAVICLE SURGERY Right 05/04/2021   CORONARY ANGIOPLASTY     CORONARY STENT INTERVENTION N/A 03/27/2018   Procedure: CORONARY STENT INTERVENTION;  Surgeon: Florencio Cara BIRCH, MD;  Location: [facility];  Service: Cardiovascular;  Laterality: N/A;   EYE SURGERY Bilateral    cataract   FOOT SURGERY Left 08/22/2022   FOOT SURGERY Right 10/31/2022   LEFT HEART CATH AND CORONARY ANGIOGRAPHY Left 03/27/2018   Procedure: Left Heart Cath with possible coronary intervention;  Surgeon: Bathe Denyse DELENA, MD;  Location: [facility] INVASIVE CV LAB;  Service: Cardiovascular;  Laterality: Left;   LUMBAR FUSION  05/08/2022   TOTAL KNEE ARTHROPLASTY Right 10/08/2018   Procedure: TOTAL KNEE ARTHROPLASTY;  Surgeon: Leora Lynwood SAUNDERS, MD;  Location: [facility] ORS;  Service: Orthopedics;  Laterality: Right;    Patient Active Problem List   Diagnosis Date Noted   Recurrent major depressive disorder, in partial remission 02/11/2024   Proteinuria 01/29/2024   Senile purpura 11/28/2023   History of cardiac catheterization 07/16/2022   Fracture of clavicle 07/16/2022   Postoperative retention of urine 07/16/2022   Hammer toe 06/04/2022   Foot pain 06/04/2022   Acquired hallux rigidus 06/04/2022   Contusion of buttock 01/15/2022   S/P cervical spinal fusion 12/12/2021   Neuropathic pain, leg, right 12/12/2021   Insomnia due to medical condition 11/14/2021   Clavicle pain 09/26/2021   Lumbar radiculopathy 08/31/2021   Family history of prostate cancer 06/08/2020   MDD (major depressive disorder), recurrent, in full remission 12/31/2019   Bereavement 12/31/2019   GAD (generalized anxiety disorder) 08/05/2019   Insomnia due to mental disorder 08/05/2019   Orthostasis 05/28/2019   Allergic rhinitis 05/26/2019   OSA on CPAP 05/19/2019   Chronic kidney disease (CKD), stage III (moderate) (HCC) 05/19/2019   Weakness of right leg 05/10/2019   Weakness of right arm 05/10/2019   S/P TKR (total knee replacement) using cement, right 10/08/2018   Hypokalemia 09/30/2018   CAD (coronary artery disease) 03/27/2018   Unstable angina (HCC) 03/24/2018   Skin lesions 10/16/2017   BPH (benign prostatic hyperplasia) 10/16/2017   Advanced care planning/counseling discussion 02/05/2017   Polycythemia, secondary 08/28/2016  Knee pain, right 08/06/2016   Elevated PSA 08/06/2016   Hypogonadism in male 11/17/2015   Hyperlipidemia 11/17/2015   Essential hypertension 11/17/2015   Erectile dysfunction 03/07/2015   Arthritis of knee 12/18/2011   Primary localized osteoarthrosis, lower leg 11/21/2011   PCP: Vicci Duwaine SQUIBB, DO  REFERRING PROVIDER: Marjorie Leisure, MD   REFERRING DIAG:  G95.9 (ICD-10-CM) - Myelopathy (HCC)  [phone] (ICD-10-CM) - Hip flexor tendinitis   RATIONALE FOR EVALUATION AND  TREATMENT: Rehabilitation  THERAPY DIAG: Unsteadiness on feet  Muscle weakness (generalized)  ONSET DATE: 2020  FOLLOW-UP APPT SCHEDULED WITH REFERRING PROVIDER: No   FROM INITIAL EVALUATION SUBJECTIVE:                                                                                                                                                                                         SUBJECTIVE STATEMENT:  RLE weakness and unsteadiness  PERTINENT HISTORY:  Pt states that he has been active for multiple years with triathalons and long distance cycling. In the summer of 2020 he began falling and losing and his balance. He was referred to [facility] neurosurgery and saw Dr. 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 He states his UE strength improved after the surgery but his RLE strength never improved. He is unsteady, drags his RLE occasionally, and wobbles. Pt reports >20 falls in the last 6 months. He underwent PT 3-4 years ago without significant improvement in his RLE strength. Pt also has a hx of a lower lumbar fusion due to compression on L5 and he still gets some pain from the R lateral lower leg to the top of the foot.   Pain: Yes, R lower leg to top of the foot Numbness/Tingling: Yes, tingling and numbness in the R lower leg and foot Focal Weakness: Yes, R hip flexor weakness and R dorsiflexor weakness; Recent changes in overall health/medication: No Prior history of physical therapy for balance:  Yes, after cervical fusion; Dominant hand: right Imaging: Yes, see history; Red flags: Negative for bowel/bladder changes, saddle paresthesia, h/o compression fx, h/o abdominal aneurysm, abdominal pain, chills/fever, night sweats, nausea, vomiting, and unexplained weight gain/loss.  PRECAUTIONS: None  WEIGHT BEARING RESTRICTIONS: No  FALLS: Has patient fallen in last 6 months? Yes. Number of falls >20 falls,   Living  Environment Lives with: lives with their spouse Lives in: House/apartment, townhouse Stairs: No  Prior level of function: Independent  Occupational demands: Retired financial planner  Hobbies: Cycling  Patient Goals: Increase RLE strength   OBJECTIVE:   Patient Surveys  ABC: To be completed LEFS: To  be completed  Cognition Patient is oriented to person, place, and time.  Recent memory is intact.  Remote memory is intact.  Attention span and concentration are intact.  Expressive speech is intact.  Patient's fund of knowledge is within normal limits for educational level.    Gross Musculoskeletal Assessment Tremor: None Bulk: Normal Tone: Not tested  Posture: Deferred  AROM Deferred  LE MMT: MMT (out of 5) Right  Left   Hip flexion 4 5  Hip extension 3+ 3+  Hip abduction 3+ 4  Hip adduction 4+ 5  Hip internal rotation 4+ 5  Hip external rotation 4+ 4+  Knee flexion 5 4  Knee extension 5 5  Ankle dorsiflexion 4+ 4+  Ankle plantarflexion 10 reps 10 reps  Ankle inversion 5 5  Ankle eversion 5 5  (* = pain; Blank rows = not tested)  Reflexes R/L Knee Jerk (L3/4): 2+/2+  Ankle Jerk (S1/2): 1+/1+   Sensation Grossly diminished throughout RLE light touch throughout as determined by testing dermatomes L2-S1. Proprioception, stereognosis, and hot/cold testing deferred on this date.  Cranial Nerves Deferred  Coordination/Cerebellar Deferred  Bed mobility: Deferred  Transfers: Assistive device utilized: None  Sit to stand: Complete Independence Stand to sit: Complete Independence Chair to chair: Complete Independence Floor: Deferred  Curb:  Deferred  Stairs: Level of Assistance: Modified independence Stair Negotiation Technique: Alternating Pattern  with Bilateral Rails Number of Stairs: 4 x 2   Height of Stairs: 6  Comments: Mild instability noted requiring rail assistance but no overt LOB requiring external assistance  Gait: Full gait  assessment deferred, some mild instability noted but easily self-corrected;  Functional Outcome Measures  09/29/24 Comments  BERG 52/56   DGI    DGI 22/24   LEFS 45/80   5TSTS 11.6s   6 Minute Walk Test    ABC 68.1%   (Blank rows = not tested)   TODAY'S TREATMENT    SUBJECTIVE: Pt reports that he is doing well today. No changes since the last therapy session. He reports one fall since the last therapy session but no significant injury. No specific questions or concerns.    PAIN: Denies   Therapeutic Activity  NuStep L2-6 BLE only x 10 minutes at start of session for strengthening during interval history; 6 lateral step downs 2 x 10 BLE; 6 step heel raises 2 x 20 biasing RLE; Prostretch calf stretch 2 x 30s; Resisted side stepping with blue tband around knees x multiple lengths;   Ther-ex  Supine SLR with 5# AW 2 x 15 L sidelying R clams with manual resistance from therapist 2 x 10; L sidelying R hip straight leg abduction with manual resistance from therapist 2 x 10;   PATIENT EDUCATION:  Education details: Exercise form/technique; Person educated: Patient Education method: Programmer, Multimedia, Facilities Manager, Verbal cues, and Handouts Education comprehension: verbalized understanding, returned demonstration, and needs further education   HOME EXERCISE PROGRAM:  Access Code: [email or web address] URL: [email or web address] Date: 09/29/2024 Prepared by: Selinda Eck  Exercises - Seated Gluteal Sets  - 1 x daily - 7 x weekly - 3 sets - 10 reps - 3s hold - Supine Straight Leg Raises (Mirrored)  - 1 x daily - 7 x weekly - 3 sets - 10 reps - 3s hold - Side Leg Lifts (Mirrored)  - 1 x daily - 7 x weekly - 3 sets - 10 reps - 3s hold   ASSESSMENT:  CLINICAL IMPRESSION: Progressed RLE strength exercises during session today with patient. He  continues with significant difficulty performing sidelying R hip clams and straight leg abductions. No HEP HEP modifications at this  time. Pt encouraged to follow-up as scheduled. He will benefit from PT services to address deficits in strength, balance, and mobility in order to improve function at home and decrease his risk for falls.    OBJECTIVE IMPAIRMENTS: decreased balance, difficulty walking, and decreased strength.   ACTIVITY LIMITATIONS: squatting and stairs  PARTICIPATION LIMITATIONS: community activity and cycling  PERSONAL FACTORS: Age, Time since onset of injury/illness/exacerbation, and 3+ comorbidities: Depression, OA, GAD, and neuropathic pain are also affecting patient's functional outcome.   REHAB POTENTIAL: Fair    CLINICAL DECISION MAKING: Evolving/moderate complexity  EVALUATION COMPLEXITY: Moderate   GOALS: Goals reviewed with patient? No  SHORT TERM GOALS: Target date: 10/22/2024  Pt will be independent with HEP in order to improve strength and balance to decrease fall risk and improve function at home and with leisure activities such as cycling Baseline:  Goal status: INITIAL   LONG TERM GOALS: Target date: 11/19/2024  Pt will increase LEFS by at least 9 points in order to demonstrate significant improvement in lower extremity function.   Baseline: 09/29/24: 45/80 Goal status: INITIAL  2.  Pt will improve BERG by at least 3 points in order to demonstrate clinically significant improvement in balance.   Baseline: 09/29/24: 52/56 Goal status: DISCONTINUED  3.  Pt will improve ABC by at least 13% in order to demonstrate clinically significant improvement in balance confidence.      Baseline: 09/29/24: 68.1% Goal status: INITIAL  4.  Pt will increase strength of R hip flexion and abduction to at least 4+/5 grade in order to demonstrate improvement in strength and function.     Baseline: flexion: 4/5, abduction: 3+/5; Goal status: INITIAL   PLAN: PT FREQUENCY: 1-2x/week  PT DURATION: 8 weeks  PLANNED INTERVENTIONS: Therapeutic exercises, Therapeutic activity, Neuromuscular  re-education, Balance training, Gait training, Patient/Family education, Self Care, Joint mobilization, Joint manipulation, Vestibular training, Canalith repositioning, Orthotic/Fit training, DME instructions, Dry Needling, Electrical stimulation, Spinal manipulation, Spinal mobilization, Cryotherapy, Moist heat, Taping, Traction, Ultrasound, Ionotophoresis 4mg /ml Dexamethasone , Manual therapy, and Re-evaluation.  PLAN FOR NEXT SESSION: gait assessment, consider digital dynamometry and Mini BESTest, progress balance and strengthening;    Selinda BIRCH Nishita Isaacks PT, DPT, GCS  Reata Petrov 10/07/2024, 9:50 PM  "

## 2024-10-06 ENCOUNTER — Ambulatory Visit

## 2024-10-06 DIAGNOSIS — R2681 Unsteadiness on feet: Secondary | ICD-10-CM

## 2024-10-06 DIAGNOSIS — M6281 Muscle weakness (generalized): Secondary | ICD-10-CM

## 2024-10-08 ENCOUNTER — Ambulatory Visit

## 2024-10-08 DIAGNOSIS — M6281 Muscle weakness (generalized): Secondary | ICD-10-CM

## 2024-10-08 DIAGNOSIS — R2681 Unsteadiness on feet: Secondary | ICD-10-CM | POA: Diagnosis not present

## 2024-10-08 NOTE — Therapy (Signed)
 " OUTPATIENT PHYSICAL THERAPY BALANCE TREATMENT   Patient Name: Kenneth Pruitt MRN: [id number] DOB:Sep 09, 1951, 74 y.o., male Today's Date: 10/08/2024  END OF SESSION:  PT End of Session - 10/08/24 1409     Visit Number 5    Number of Visits 17    Date for Recertification  11/19/24    Authorization Type eval: 09/24/24    PT Start Time 1410    PT Stop Time 1440    PT Time Calculation (min) 30 min    Equipment Utilized During Treatment Gait belt    Activity Tolerance Patient tolerated treatment well    Behavior During Therapy WFL for tasks assessed/performed         Past Medical History:  Diagnosis Date   Actinic keratosis    Arthritis    Coronary artery disease    Depression    Hyperlipidemia    Skin cancer    left forearm treated years ago by Dr. Oneil Rolling   Past Surgical History:  Procedure Laterality Date   ANKLE FRACTURE SURGERY Right    ARTHRODESIS ANT INTERBODY INC DISCECTOMY, CERVICAL BELOW C2    05/2019   CARDIAC CATHETERIZATION N/A 04/26/2015   Procedure: Left Heart Cath;  Surgeon: Denyse DELENA Bathe, MD;  Location: [facility]IVE [facility];  Service: Cardiovascular;  Laterality: N/A;   CLAVICLE SURGERY Right 05/04/2021   CORONARY ANGIOPLASTY     CORONARY STENT INTERVENTION N/A 03/27/2018   Procedure: CORONARY STENT INTERVENTION;  Surgeon: Florencio Cara BIRCH, MD;  Location: [facility];  Service: Cardiovascular;  Laterality: N/A;   EYE SURGERY Bilateral    cataract   FOOT SURGERY Left 08/22/2022   FOOT SURGERY Right 10/31/2022   LEFT HEART CATH AND CORONARY ANGIOGRAPHY Left 03/27/2018   Procedure: Left Heart Cath with possible coronary intervention;  Surgeon: Bathe Denyse DELENA, MD;  Location: [facility] INVASIVE CV [facility];  Service: Cardiovascular;  Laterality: Left;   LUMBAR FUSION  05/08/2022   TOTAL KNEE ARTHROPLASTY Right 10/08/2018   Procedure: TOTAL KNEE ARTHROPLASTY;  Surgeon: Leora Lynwood SAUNDERS, MD;  Location: [facility] OR[facility];  Service: Orthopedics;  Laterality: Right;    Patient Active Problem List   Diagnosis Date Noted   Recurrent major depressive disorder, in partial remission 02/11/2024   Proteinuria 01/29/2024   Senile purpura 11/28/2023   History of cardiac catheterization 07/16/2022   Fracture of clavicle 07/16/2022   Postoperative retention of urine 07/16/2022   Hammer toe 06/04/2022   Foot pain 06/04/2022   Acquired hallux rigidus 06/04/2022   Contusion of buttock 01/15/2022   S/P cervical spinal fusion 12/12/2021   Neuropathic pain, leg, right 12/12/2021   Insomnia due to medical condition 11/14/2021   Clavicle pain 09/26/2021   Lumbar radiculopathy 08/31/2021   Family history of prostate cancer 06/08/2020   MDD (major depressive disorder), recurrent, in full remission 12/31/2019   Bereavement 12/31/2019   GAD (generalized anxiety disorder) 08/05/2019   Insomnia due to mental disorder 08/05/2019   Orthostasis 05/28/2019   Allergic rhinitis 05/26/2019   OSA on CPAP 05/19/2019   Chronic kidney disease (CKD), stage III (moderate) (HCC) 05/19/2019   Weakness of right leg 05/10/2019   Weakness of right arm 05/10/2019   S/P TKR (total knee replacement) using cement, right 10/08/2018   Hypokalemia 09/30/2018   CAD (coronary artery disease) 03/27/2018   Unstable angina (HCC) 03/24/2018   Skin lesions 10/16/2017   BPH (benign prostatic hyperplasia) 10/16/2017   Advanced care planning/counseling discussion 02/05/2017   Polycythemia, secondary 08/28/2016  Knee pain, right 08/06/2016   Elevated PSA 08/06/2016   Hypogonadism in male 11/17/2015   Hyperlipidemia 11/17/2015   Essential hypertension 11/17/2015   Erectile dysfunction 03/07/2015   Arthritis of knee 12/18/2011   Primary localized osteoarthrosis, lower leg 11/21/2011   PCP: Vicci Duwaine SQUIBB, DO  REFERRING PROVIDER: Marjorie Leisure, MD   REFERRING DIAG:  G95.9 (ICD-10-CM) - Myelopathy (HCC)  [phone] (ICD-10-CM) - Hip flexor tendinitis   RATIONALE FOR EVALUATION AND  TREATMENT: Rehabilitation  THERAPY DIAG: Unsteadiness on feet  Muscle weakness (generalized)  ONSET DATE: 2020  FOLLOW-UP APPT SCHEDULED WITH REFERRING PROVIDER: No   FROM INITIAL EVALUATION SUBJECTIVE:                                                                                                                                                                                         SUBJECTIVE STATEMENT:  RLE weakness and unsteadiness  PERTINENT HISTORY:  Pt states that he has been active for multiple years with triathalons and long distance cycling. In the summer of 2020 he began falling and losing and his balance. He was referred to [facility] neurosurgery and saw Dr. 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 He states his UE strength improved after the surgery but his RLE strength never improved. He is unsteady, drags his RLE occasionally, and wobbles. Pt reports >20 falls in the last 6 months. He underwent PT 3-4 years ago without significant improvement in his RLE strength. Pt also has a hx of a lower lumbar fusion due to compression on L5 and he still gets some pain from the R lateral lower leg to the top of the foot.   Pain: Yes, R lower leg to top of the foot Numbness/Tingling: Yes, tingling and numbness in the R lower leg and foot Focal Weakness: Yes, R hip flexor weakness and R dorsiflexor weakness; Recent changes in overall health/medication: No Prior history of physical therapy for balance:  Yes, after cervical fusion; Dominant hand: right Imaging: Yes, see history; Red flags: Negative for bowel/bladder changes, saddle paresthesia, h/o compression fx, h/o abdominal aneurysm, abdominal pain, chills/fever, night sweats, nausea, vomiting, and unexplained weight gain/loss.  PRECAUTIONS: None  WEIGHT BEARING RESTRICTIONS: No  FALLS: Has patient fallen in last 6 months? Yes. Number of falls >20 falls,   Living  Environment Lives with: lives with their spouse Lives in: House/apartment, townhouse Stairs: No  Prior level of function: Independent  Occupational demands: Retired financial planner  Hobbies: Cycling  Patient Goals: Increase RLE strength   OBJECTIVE:   Patient Surveys  ABC: To be completed LEFS: To  be completed  Cognition Patient is oriented to person, place, and time.  Recent memory is intact.  Remote memory is intact.  Attention span and concentration are intact.  Expressive speech is intact.  Patient's fund of knowledge is within normal limits for educational level.    Gross Musculoskeletal Assessment Tremor: None Bulk: Normal Tone: Not tested  Posture: Deferred  AROM Deferred  LE MMT: MMT (out of 5) Right  Left   Hip flexion 4 5  Hip extension 3+ 3+  Hip abduction 3+ 4  Hip adduction 4+ 5  Hip internal rotation 4+ 5  Hip external rotation 4+ 4+  Knee flexion 5 4  Knee extension 5 5  Ankle dorsiflexion 4+ 4+  Ankle plantarflexion 10 reps 10 reps  Ankle inversion 5 5  Ankle eversion 5 5  (* = pain; Blank rows = not tested)  Reflexes R/L Knee Jerk (L3/4): 2+/2+  Ankle Jerk (S1/2): 1+/1+   Sensation Grossly diminished throughout RLE light touch throughout as determined by testing dermatomes L2-S1. Proprioception, stereognosis, and hot/cold testing deferred on this date.  Cranial Nerves Deferred  Coordination/Cerebellar Deferred  Bed mobility: Deferred  Transfers: Assistive device utilized: None  Sit to stand: Complete Independence Stand to sit: Complete Independence Chair to chair: Complete Independence Floor: Deferred  Curb:  Deferred  Stairs: Level of Assistance: Modified independence Stair Negotiation Technique: Alternating Pattern  with Bilateral Rails Number of Stairs: 4 x 2   Height of Stairs: 6  Comments: Mild instability noted requiring rail assistance but no overt LOB requiring external assistance  Gait: Full gait  assessment deferred, some mild instability noted but easily self-corrected;  Functional Outcome Measures  09/29/24 Comments  BERG 52/56   DGI    DGI 22/24   LEFS 45/80   5TSTS 11.6s   6 Minute Walk Test    ABC 68.1%   (Blank rows = not tested)   TODAY'S TREATMENT    SUBJECTIVE: Pt reports that he is doing well today. No changes since the last therapy session. No falls since the last appt. No specific questions or concerns.    PAIN: Denies   Therapeutic Activity  NuStep L2-6 BLE only x 8 minutes at start of session for strengthening during interval history; 12 forward step-ups leading with RLE without UE support x 20, x 15; Total Gym L22 double leg squats x 10; TG L22 R single leg squats 2 x 20;   Ther-ex  L sidelying R clams with manual resistance from therapist x 10; L sidelying R reverse clams with manual resistance from therapist x 10; L sidelying R hip straight leg abduction with manual resistance from therapist x 10;   Not performed Supine SLR with 5# AW 2 x 15 6 lateral step downs 2 x 10 BLE; 6 step heel raises 2 x 20 biasing RLE; Prostretch calf stretch 2 x 30s; Resisted side stepping with blue tband around knees x multiple lengths;   PATIENT EDUCATION:  Education details: Exercise form/technique; Person educated: Patient Education method: Programmer, Multimedia, Demonstration, Verbal cues, and Handouts Education comprehension: verbalized understanding, returned demonstration, and needs further education   HOME EXERCISE PROGRAM:  Access Code: [email or web address] URL: [email or web address] Date: 09/29/2024 Prepared by: Selinda Eck  Exercises - Seated Gluteal Sets  - 1 x daily - 7 x weekly - 3 sets - 10 reps - 3s hold - Supine Straight Leg Raises (Mirrored)  - 1 x daily - 7 x weekly - 3 sets - 10 reps - 3s hold - Side  Leg Lifts (Mirrored)  - 1 x daily - 7 x weekly - 3 sets - 10 reps - 3s hold   ASSESSMENT:  CLINICAL IMPRESSION: Progressed RLE  strength exercises during session today with patient. He continues with significant difficulty performing sidelying R hip clams and straight leg abductions. No HEP modifications at this time. Pt encouraged to follow-up as scheduled. He will benefit from PT services to address deficits in strength, balance, and mobility in order to improve function at home and decrease his risk for falls.    OBJECTIVE IMPAIRMENTS: decreased balance, difficulty walking, and decreased strength.   ACTIVITY LIMITATIONS: squatting and stairs  PARTICIPATION LIMITATIONS: community activity and cycling  PERSONAL FACTORS: Age, Time since onset of injury/illness/exacerbation, and 3+ comorbidities: Depression, OA, GAD, and neuropathic pain are also affecting patient's functional outcome.   REHAB POTENTIAL: Fair    CLINICAL DECISION MAKING: Evolving/moderate complexity  EVALUATION COMPLEXITY: Moderate   GOALS: Goals reviewed with patient? No  SHORT TERM GOALS: Target date: 10/22/2024  Pt will be independent with HEP in order to improve strength and balance to decrease fall risk and improve function at home and with leisure activities such as cycling Baseline:  Goal status: INITIAL   LONG TERM GOALS: Target date: 11/19/2024  Pt will increase LEFS by at least 9 points in order to demonstrate significant improvement in lower extremity function.   Baseline: 09/29/24: 45/80 Goal status: INITIAL  2.  Pt will improve BERG by at least 3 points in order to demonstrate clinically significant improvement in balance.   Baseline: 09/29/24: 52/56 Goal status: DISCONTINUED  3.  Pt will improve ABC by at least 13% in order to demonstrate clinically significant improvement in balance confidence.      Baseline: 09/29/24: 68.1% Goal status: INITIAL  4.  Pt will increase strength of R hip flexion and abduction to at least 4+/5 grade in order to demonstrate improvement in strength and function.     Baseline: flexion: 4/5,  abduction: 3+/5; Goal status: INITIAL   PLAN: PT FREQUENCY: 1-2x/week  PT DURATION: 8 weeks  PLANNED INTERVENTIONS: Therapeutic exercises, Therapeutic activity, Neuromuscular re-education, Balance training, Gait training, Patient/Family education, Self Care, Joint mobilization, Joint manipulation, Vestibular training, Canalith repositioning, Orthotic/Fit training, DME instructions, Dry Needling, Electrical stimulation, Spinal manipulation, Spinal mobilization, Cryotherapy, Moist heat, Taping, Traction, Ultrasound, Ionotophoresis 4mg /ml Dexamethasone , Manual therapy, and Re-evaluation.  PLAN FOR NEXT SESSION: gait assessment, consider digital dynamometry and Mini BESTest, progress balance and strengthening;    Selinda BIRCH Zehava Turski PT, DPT, GCS  Cade Dashner 10/08/2024, 4:15 PM  "

## 2024-10-13 ENCOUNTER — Ambulatory Visit

## 2024-10-13 NOTE — Therapy (Incomplete)
 " OUTPATIENT PHYSICAL THERAPY BALANCE TREATMENT   Patient Name: Kenneth Pruitt MRN: [id number] DOB:[birth year], 74 y.o., male Today's Date: 10/13/2024  END OF SESSION:   Past Medical History:  Diagnosis Date   Actinic keratosis    Arthritis    Coronary artery disease    Depression    Hyperlipidemia    Skin cancer    left forearm treated years ago by Dr. Oneil Rolling   Past Surgical History:  Procedure Laterality Date   ANKLE FRACTURE SURGERY Right    ARTHRODESIS ANT INTERBODY INC DISCECTOMY, CERVICAL BELOW C2    05/2019   CARDIAC CATHETERIZATION N/A 04/26/2015   Procedure: Left Heart Cath;  Surgeon: Denyse DELENA Bathe, MD;  Location: [facility] INVASIVE CV LAB;  Service: Cardiovascular;  Laterality: N/A;   CLAVICLE SURGERY Right 05/04/2021   CORONARY ANGIOPLASTY     CORONARY STENT INTERVENTION N/A 03/27/2018   Procedure: CORONARY STENT INTERVENTION;  Surgeon: Florencio Cara BIRCH, MD;  Location: [facility] INVASIVE CV LAB;  Service: Cardiovascular;  Laterality: N/A;   EYE SURGERY Bilateral    cataract   FOOT SURGERY Left 08/22/2022   FOOT SURGERY Right 10/31/2022   LEFT HEART CATH AND CORONARY ANGIOGRAPHY Left 03/27/2018   Procedure: Left Heart Cath with possible coronary intervention;  Surgeon: Bathe Denyse DELENA, MD;  Location: [facility] INVASIVE CV LAB;  Service: Cardiovascular;  Laterality: Left;   LUMBAR FUSION  05/08/2022   TOTAL KNEE ARTHROPLASTY Right 10/08/2018   Procedure: TOTAL KNEE ARTHROPLASTY;  Surgeon: Leora Lynwood SAUNDERS, MD;  Location: [facility] ORS;  Service: Orthopedics;  Laterality: Right;   Patient Active Problem List   Diagnosis Date Noted   Recurrent major depressive disorder, in partial remission 02/11/2024   Proteinuria 01/29/2024   Senile purpura 11/28/2023   History of cardiac catheterization 07/16/2022   Fracture of clavicle 07/16/2022   Postoperative retention of urine 07/16/2022   Hammer toe 06/04/2022   Foot pain 06/04/2022   Acquired hallux rigidus 06/04/2022    Contusion of buttock 01/15/2022   S/P cervical spinal fusion 12/12/2021   Neuropathic pain, leg, right 12/12/2021   Insomnia due to medical condition 11/14/2021   Clavicle pain 09/26/2021   Lumbar radiculopathy 08/31/2021   Family history of prostate cancer 06/08/2020   MDD (major depressive disorder), recurrent, in full remission 12/31/2019   Bereavement 12/31/2019   GAD (generalized anxiety disorder) 08/05/2019   Insomnia due to mental disorder 08/05/2019   Orthostasis 05/28/2019   Allergic rhinitis 05/26/2019   OSA on CPAP 05/19/2019   Chronic kidney disease (CKD), stage III (moderate) (HCC) 05/19/2019   Weakness of right leg 05/10/2019   Weakness of right arm 05/10/2019   S/P TKR (total knee replacement) using cement, right 10/08/2018   Hypokalemia 09/30/2018   CAD (coronary artery disease) 03/27/2018   Unstable angina (HCC) 03/24/2018   Skin lesions 10/16/2017   BPH (benign prostatic hyperplasia) 10/16/2017   Advanced care planning/counseling discussion 02/05/2017   Polycythemia, secondary 08/28/2016   Knee pain, right 08/06/2016   Elevated PSA 08/06/2016   Hypogonadism in male 11/17/2015   Hyperlipidemia 11/17/2015   Essential hypertension 11/17/2015   Erectile dysfunction 03/07/2015   Arthritis of knee 12/18/2011   Primary localized osteoarthrosis, lower leg 11/21/2011   PCP: Vicci Duwaine SQUIBB, DO  REFERRING PROVIDER: Marjorie Leisure, MD   REFERRING DIAG:  G95.9 (ICD-10-CM) - Myelopathy (HCC)  M76.899 (ICD-10-CM) - Hip flexor tendinitis   RATIONALE FOR EVALUATION AND TREATMENT: Rehabilitation  THERAPY DIAG: Unsteadiness on feet  Muscle weakness (generalized)  ONSET  DATE: 2020  FOLLOW-UP APPT SCHEDULED WITH REFERRING PROVIDER: No   FROM INITIAL EVALUATION SUBJECTIVE:                                                                                                                                                                                          SUBJECTIVE STATEMENT:  RLE weakness and unsteadiness  PERTINENT HISTORY:  Pt states that he has been active for multiple years with triathalons and long distance cycling. In the summer of 2020 he began falling and losing and his balance. He was referred to [facility] neurosurgery and saw Dr. 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 He states his UE strength improved after the surgery but his RLE strength never improved. He is unsteady, drags his RLE occasionally, and wobbles. Pt reports >20 falls in the last 6 months. He underwent PT 3-4 years ago without significant improvement in his RLE strength. Pt also has a hx of a lower lumbar fusion due to compression on L5 and he still gets some pain from the R lateral lower leg to the top of the foot.   Pain: Yes, R lower leg to top of the foot Numbness/Tingling: Yes, tingling and numbness in the R lower leg and foot Focal Weakness: Yes, R hip flexor weakness and R dorsiflexor weakness; Recent changes in overall health/medication: No Prior history of physical therapy for balance:  Yes, after cervical fusion; Dominant hand: right Imaging: Yes, see history; Red flags: Negative for bowel/bladder changes, saddle paresthesia, h/o compression fx, h/o abdominal aneurysm, abdominal pain, chills/fever, night sweats, nausea, vomiting, and unexplained weight gain/loss.  PRECAUTIONS: None  WEIGHT BEARING RESTRICTIONS: No  FALLS: Has patient fallen in last 6 months? Yes. Number of falls >20 falls,   Living Environment Lives with: lives with their spouse Lives in: House/apartment, townhouse Stairs: No  Prior level of function: Independent  Occupational demands: Retired financial planner  Hobbies: Cycling  Patient Goals: Increase RLE strength   OBJECTIVE:   Patient Surveys  ABC: To be completed LEFS: To be completed  Cognition Patient is oriented to person, place, and time.  Recent  memory is intact.  Remote memory is intact.  Attention span and concentration are intact.  Expressive speech is intact.  Patient's fund of knowledge is within normal limits for educational level.    Gross Musculoskeletal Assessment Tremor: None Bulk: Normal Tone: Not tested  Posture: Deferred  AROM Deferred  LE MMT: MMT (out of 5) Right  Left   Hip flexion 4 5  Hip extension 3+ 3+  Hip abduction 3+ 4  Hip  adduction 4+ 5  Hip internal rotation 4+ 5  Hip external rotation 4+ 4+  Knee flexion 5 4  Knee extension 5 5  Ankle dorsiflexion 4+ 4+  Ankle plantarflexion 10 reps 10 reps  Ankle inversion 5 5  Ankle eversion 5 5  (* = pain; Blank rows = not tested)  Reflexes R/L Knee Jerk (L3/4): 2+/2+  Ankle Jerk (S1/2): 1+/1+   Sensation Grossly diminished throughout RLE light touch throughout as determined by testing dermatomes L2-S1. Proprioception, stereognosis, and hot/cold testing deferred on this date.  Cranial Nerves Deferred  Coordination/Cerebellar Deferred  Bed mobility: Deferred  Transfers: Assistive device utilized: None  Sit to stand: Complete Independence Stand to sit: Complete Independence Chair to chair: Complete Independence Floor: Deferred  Curb:  Deferred  Stairs: Level of Assistance: Modified independence Stair Negotiation Technique: Alternating Pattern  with Bilateral Rails Number of Stairs: 4 x 2   Height of Stairs: 6  Comments: Mild instability noted requiring rail assistance but no overt LOB requiring external assistance  Gait: Full gait assessment deferred, some mild instability noted but easily self-corrected;  Functional Outcome Measures  09/29/24 Comments  BERG 52/56   DGI    DGI 22/24   LEFS 45/80   5TSTS 11.6s   6 Minute Walk Test    ABC 68.1%   (Blank rows = not tested)   TODAY'S TREATMENT    SUBJECTIVE: Pt reports that he is doing well today. No changes since the last therapy session. No falls since the last  appt. No specific questions or concerns.    PAIN: Denies   Therapeutic Activity  NuStep L2-6 BLE only x 8 minutes at start of session for strengthening during interval history; 12 forward step-ups leading with RLE without UE support x 20, x 15; Total Gym L22 double leg squats x 10; TG L22 R single leg squats 2 x 20;   Ther-ex  L sidelying R clams with manual resistance from therapist x 10; L sidelying R reverse clams with manual resistance from therapist x 10; L sidelying R hip straight leg abduction with manual resistance from therapist x 10;   Not performed Supine SLR with 5# AW 2 x 15 6 lateral step downs 2 x 10 BLE; 6 step heel raises 2 x 20 biasing RLE; Prostretch calf stretch 2 x 30s; Resisted side stepping with blue tband around knees x multiple lengths;   PATIENT EDUCATION:  Education details: Exercise form/technique; Person educated: Patient Education method: Programmer, Multimedia, Demonstration, Verbal cues, and Handouts Education comprehension: verbalized understanding, returned demonstration, and needs further education   HOME EXERCISE PROGRAM:  Access Code: [email or web address] URL: [email or web address] Date: 09/29/2024 Prepared by: Selinda Eck  Exercises - Seated Gluteal Sets  - 1 x daily - 7 x weekly - 3 sets - 10 reps - 3s hold - Supine Straight Leg Raises (Mirrored)  - 1 x daily - 7 x weekly - 3 sets - 10 reps - 3s hold - Side Leg Lifts (Mirrored)  - 1 x daily - 7 x weekly - 3 sets - 10 reps - 3s hold   ASSESSMENT:  CLINICAL IMPRESSION: Progressed RLE strength exercises during session today with patient. He continues with significant difficulty performing sidelying R hip clams and straight leg abductions. No HEP modifications at this time. Pt encouraged to follow-up as scheduled. He will benefit from PT services to address deficits in strength, balance, and mobility in order to improve function at home and decrease his risk for falls.    OBJECTIVE  IMPAIRMENTS: decreased balance, difficulty walking, and decreased strength.   ACTIVITY LIMITATIONS: squatting and stairs  PARTICIPATION LIMITATIONS: community activity and cycling  PERSONAL FACTORS: Age, Time since onset of injury/illness/exacerbation, and 3+ comorbidities: Depression, OA, GAD, and neuropathic pain are also affecting patient's functional outcome.   REHAB POTENTIAL: Fair    CLINICAL DECISION MAKING: Evolving/moderate complexity  EVALUATION COMPLEXITY: Moderate   GOALS: Goals reviewed with patient? No  SHORT TERM GOALS: Target date: 10/22/2024  Pt will be independent with HEP in order to improve strength and balance to decrease fall risk and improve function at home and with leisure activities such as cycling Baseline:  Goal status: INITIAL   LONG TERM GOALS: Target date: 11/19/2024  Pt will increase LEFS by at least 9 points in order to demonstrate significant improvement in lower extremity function.   Baseline: 09/29/24: 45/80 Goal status: INITIAL  2.  Pt will improve BERG by at least 3 points in order to demonstrate clinically significant improvement in balance.   Baseline: 09/29/24: 52/56 Goal status: DISCONTINUED  3.  Pt will improve ABC by at least 13% in order to demonstrate clinically significant improvement in balance confidence.      Baseline: 09/29/24: 68.1% Goal status: INITIAL  4.  Pt will increase strength of R hip flexion and abduction to at least 4+/5 grade in order to demonstrate improvement in strength and function.     Baseline: flexion: 4/5, abduction: 3+/5; Goal status: INITIAL   PLAN: PT FREQUENCY: 1-2x/week  PT DURATION: 8 weeks  PLANNED INTERVENTIONS: Therapeutic exercises, Therapeutic activity, Neuromuscular re-education, Balance training, Gait training, Patient/Family education, Self Care, Joint mobilization, Joint manipulation, Vestibular training, Canalith repositioning, Orthotic/Fit training, DME instructions, Dry Needling,  Electrical stimulation, Spinal manipulation, Spinal mobilization, Cryotherapy, Moist heat, Taping, Traction, Ultrasound, Ionotophoresis 4mg /ml Dexamethasone , Manual therapy, and Re-evaluation.  PLAN FOR NEXT SESSION: gait assessment, consider digital dynamometry and Mini BESTest, progress balance and strengthening;    Selinda BIRCH Kesean Serviss PT, DPT, GCS  Duston Smolenski 10/13/2024, 10:49 AM  "

## 2024-10-14 ENCOUNTER — Ambulatory Visit: Payer: Self-pay

## 2024-10-14 NOTE — Telephone Encounter (Signed)
 FYI Only or Action Required?: FYI only for provider: appointment scheduled on 10/15/24, UC refused.  Patient was last seen in primary care on 06/25/2024 by Fernand Denyse LABOR, MD.  Called Nurse Triage reporting Abdominal Pain.  Symptoms began several days ago.  Interventions attempted: Rest, hydration, or home remedies.  Symptoms are: gradually worsening.  Triage Disposition: See HCP Within 4 Hours (Or PCP Triage)  Patient/caregiver understands and will follow disposition?: No, wishes to speak with PCP  Reason for Disposition  [1] MILD-MODERATE pain AND [2] constant AND [3] present > 2 hours  Answer Assessment - Initial Assessment Questions Spoke with patient's wife, Dorothyann, who states that patient has been having heartburn like abdominal pain intermittently for the last few weeks. It has become more constant over the last 3-4 days and he is also experiencing nausea, she believes vomiting as well. Office visit or UC advised, would like to see PCP-soonest available tomorrow.   1. LOCATION: Where does it hurt?      Mid abdomen  2. RADIATION: Does the pain shoot anywhere else? (e.g., chest, back)     No  3. ONSET: When did the pain begin? (Minutes, hours or days ago)      Has been intermittent for a few weeks, constant over the last 3-4 days  4. SUDDEN: Gradual or sudden onset?     Gradual  5. PATTERN Does the pain come and go, or is it constant?     Constant  6. SEVERITY: How bad is the pain?  (e.g., Scale 1-10; mild, moderate, or severe)     Moderate per patient's wife  7. RECURRENT SYMPTOM: Have you ever had this type of stomach pain before? If Yes, ask: When was the last time? and What happened that time?      No  8. CAUSE: What do you think is causing the stomach pain? (e.g., gallstones, recent abdominal surgery)     Not sure  9. RELIEVING/AGGRAVATING FACTORS: What makes it better or worse? (e.g., antacids, bending or twisting motion, bowel  movement)     Unknown  10. OTHER SYMPTOMS: Do you have any other symptoms? (e.g., back pain, diarrhea, fever, urination pain, vomiting)       Nausea  Protocols used: Abdominal Pain - Male-A-AH  Reason for Triage: Nausea, abdominal pain, not feeling well. Pt's wife stated pt has not been himself. Transferred to NT

## 2024-10-15 ENCOUNTER — Other Ambulatory Visit: Payer: Self-pay | Admitting: Family Medicine

## 2024-10-15 ENCOUNTER — Encounter: Payer: Self-pay | Admitting: Family Medicine

## 2024-10-15 ENCOUNTER — Ambulatory Visit: Admitting: Family Medicine

## 2024-10-15 ENCOUNTER — Ambulatory Visit

## 2024-10-15 VITALS — BP 120/67 | HR 67 | Temp 97.9°F | Resp 17 | Ht 72.99 in | Wt 193.8 lb

## 2024-10-15 DIAGNOSIS — R2681 Unsteadiness on feet: Secondary | ICD-10-CM

## 2024-10-15 DIAGNOSIS — N401 Enlarged prostate with lower urinary tract symptoms: Secondary | ICD-10-CM

## 2024-10-15 DIAGNOSIS — I499 Cardiac arrhythmia, unspecified: Secondary | ICD-10-CM

## 2024-10-15 DIAGNOSIS — M6281 Muscle weakness (generalized): Secondary | ICD-10-CM

## 2024-10-15 DIAGNOSIS — R42 Dizziness and giddiness: Secondary | ICD-10-CM | POA: Diagnosis not present

## 2024-10-15 MED ORDER — ONDANSETRON 4 MG PO TBDP: 4.0000 mg | ORAL_TABLET | Freq: Three times a day (TID) | ORAL | 3 refills | Status: AC | PRN

## 2024-10-15 NOTE — Progress Notes (Signed)
 "  BP 120/67 (BP Location: Left Arm, Patient Position: Sitting, Cuff Size: Normal)   Pulse 67   Temp 97.9 F (36.6 C) (Oral)   Resp 17   Ht 6' 0.99 (1.854 m)   Wt 193 lb 12.8 oz (87.9 kg)   SpO2 94%   BMI 25.57 kg/m    Subjective:    Patient ID: Kenneth Pruitt, male    DOB: [birth year], 74 y.o.   MRN: [id number]  HPI: Kenneth Pruitt is a 74 y.o. male  Chief Complaint  Patient presents with   Nausea    Feels nauseas a lot sort of dizziness happens every afternoon but subsides after he takes a nap. 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   Dizziness    Feels nauseas a lot sort of dizziness happens every afternoon but subsides after he takes a nap. 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   DIZZINESS- just recently moved a few weeks ago, has been under a lot of stress Duration: 1-2 weeks Description of symptoms: nauseous, sort of dizziness, off balance, light headed Duration of episode: until he takes a nap or lays down Dizziness frequency: every afternoon Provoking factors: unknown Aggravating factors:  after lunch, but no set time Triggered by rolling over in bed: no Triggered by bending over: no Aggravated by head movement: no Aggravated by exertion, coughing, loud noises: no Recent head injury: no Recent or current viral symptoms: no History of vasovagal episodes: no Nausea: yes Vomiting: no Tinnitus: no Hearing loss: no Aural fullness: no Headache: yes Photophobia/phonophobia: no Unsteady gait: no changes Postural instability: no changes Diplopia, dysarthria, dysphagia or weakness: no Related to exertion: no Pallor: no Diaphoresis: no Dyspnea: no Chest pain: no   Relevant past medical, surgical, family and social history reviewed and updated as indicated. Interim medical history since our last visit reviewed. Allergies and medications reviewed and updated.  Review of  Systems  Constitutional:  Positive for fatigue. Negative for activity change, appetite change, chills, diaphoresis, fever and unexpected weight change.  Respiratory: Negative.    Cardiovascular: Negative.   Gastrointestinal:  Positive for nausea. Negative for abdominal distention, abdominal pain, anal bleeding, blood in stool, constipation, diarrhea, rectal pain and vomiting.  Neurological:  Positive for dizziness. Negative for tremors, seizures, syncope, facial asymmetry, speech difficulty, weakness, light-headedness, numbness and headaches.  Psychiatric/Behavioral: Negative.      Per HPI unless specifically indicated above     Objective:    BP 120/67 (BP Location: Left Arm, Patient Position: Sitting, Cuff Size: Normal)   Pulse 67   Temp 97.9 F (36.6 C) (Oral)   Resp 17   Ht 6' 0.99 (1.854 m)   Wt 193 lb 12.8 oz (87.9 kg)   SpO2 94%   BMI 25.57 kg/m   Wt Readings from Last 3 Encounters:  10/15/24 193 lb 12.8 oz (87.9 kg)  06/25/24 193 lb 6.4 oz (87.7 kg)  06/12/24 195 lb (88.5 kg)    Physical Exam Vitals and nursing note reviewed.  Constitutional:      General: He is not in acute distress.    Appearance: Normal appearance. He is well-developed and normal weight. He is not ill-appearing, toxic-appearing or diaphoretic.  HENT:     Head: Normocephalic and atraumatic.     Right Ear: External ear normal. A middle ear effusion is present.  Left Ear: External ear normal.     Nose: Nose normal.     Mouth/Throat:     Mouth: Mucous membranes are moist.     Pharynx: Oropharynx is clear.  Eyes:     General: No scleral icterus.       Right eye: No discharge.        Left eye: No discharge.     Extraocular Movements: Extraocular movements intact.     Left eye: Nystagmus present.     Conjunctiva/sclera: Conjunctivae normal.     Pupils: Pupils are equal, round, and reactive to light.  Cardiovascular:     Rate and Rhythm: Normal rate. Rhythm irregular.     Pulses: Normal  pulses.     Heart sounds: Normal heart sounds. No murmur heard.    No friction rub. No gallop.  Pulmonary:     Effort: Pulmonary effort is normal. No respiratory distress.     Breath sounds: Normal breath sounds. No stridor. No wheezing, rhonchi or rales.  Chest:     Chest wall: No tenderness.  Musculoskeletal:        General: Normal range of motion.     Cervical back: Normal range of motion and neck supple.  Skin:    General: Skin is warm and dry.     Capillary Refill: Capillary refill takes less than 2 seconds.     Coloration: Skin is not jaundiced or pale.     Findings: No bruising, erythema, lesion or rash.  Neurological:     General: No focal deficit present.     Mental Status: He is alert and oriented to person, place, and time. Mental status is at baseline.  Psychiatric:        Mood and Affect: Mood normal.        Behavior: Behavior normal.        Thought Content: Thought content normal.        Judgment: Judgment normal.     Results for orders placed or performed in visit on 06/11/24  Testosterone    Collection Time: 06/11/24  8:04 AM  Result Value Ref Range   Testosterone  268 264 - 916 ng/dL  PSA   Collection Time: 06/11/24  8:04 AM  Result Value Ref Range   Prostate Specific Ag, Serum 3.6 0.0 - 4.0 ng/mL  Hemoglobin and Hematocrit, Blood   Collection Time: 06/11/24  8:04 AM  Result Value Ref Range   Hemoglobin 16.7 13.0 - 17.7 g/dL   Hematocrit 49.4 62.4 - 51.0 %      Assessment & Plan:   Problem List Items Addressed This Visit   None Visit Diagnoses       Dizziness    -  Primary   Concern for vertigo. Will treat with epley's manuver and check labs. Call if not getting better or getting worse.   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   Relevant  Orders   EKG 12-Lead        Follow up plan: Return for As scheduled.      "

## 2024-10-15 NOTE — Therapy (Signed)
 " OUTPATIENT PHYSICAL THERAPY BALANCE TREATMENT   Patient Name: Kenneth Pruitt MRN: [id number] DOB:[birth year], 74 y.o., male Today's Date: 10/15/2024  END OF SESSION:  PT End of Session - 10/15/24 1400     Visit Number 6    Number of Visits 17    Date for Recertification  11/19/24    Authorization Type eval: 09/24/24    PT Start Time 1400    PT Stop Time 1441    PT Time Calculation (min) 41 min    Equipment Utilized During Treatment Gait belt    Activity Tolerance Patient tolerated treatment well    Behavior During Therapy WFL for tasks assessed/performed          Past Medical History:  Diagnosis Date   Actinic keratosis    Arthritis    Coronary artery disease    Depression    Hyperlipidemia    Skin cancer    left forearm treated years ago by Dr. Oneil Rolling   Past Surgical History:  Procedure Laterality Date   ANKLE FRACTURE SURGERY Right    ARTHRODESIS ANT INTERBODY INC DISCECTOMY, CERVICAL BELOW C2    05/2019   CARDIAC CATHETERIZATION N/A 04/26/2015   Procedure: Left Heart Cath;  Surgeon: Denyse DELENA Bathe, MD;  Location: [facility] INVASIVE CV LAB;  Service: Cardiovascular;  Laterality: N/A;   CLAVICLE SURGERY Right 05/04/2021   CORONARY ANGIOPLASTY     CORONARY STENT INTERVENTION N/A 03/27/2018   Procedure: CORONARY STENT INTERVENTION;  Surgeon: Florencio Cara BIRCH, MD;  Location: [facility] INVASIVE CV LAB;  Service: Cardiovascular;  Laterality: N/A;   EYE SURGERY Bilateral    cataract   FOOT SURGERY Left 08/22/2022   FOOT SURGERY Right 10/31/2022   LEFT HEART CATH AND CORONARY ANGIOGRAPHY Left 03/27/2018   Procedure: Left Heart Cath with possible coronary intervention;  Surgeon: Bathe Denyse DELENA, MD;  Location: [facility] INVASIVE CV LAB;  Service: Cardiovascular;  Laterality: Left;   LUMBAR FUSION  05/08/2022   TOTAL KNEE ARTHROPLASTY Right 10/08/2018   Procedure: TOTAL KNEE ARTHROPLASTY;  Surgeon: Leora Lynwood SAUNDERS, MD;  Location: [facility] ORS;  Service: Orthopedics;  Laterality:  Right;   Patient Active Problem List   Diagnosis Date Noted   Recurrent major depressive disorder, in partial remission 02/11/2024   Proteinuria 01/29/2024   Senile purpura 11/28/2023   History of cardiac catheterization 07/16/2022   Fracture of clavicle 07/16/2022   Postoperative retention of urine 07/16/2022   Hammer toe 06/04/2022   Foot pain 06/04/2022   Acquired hallux rigidus 06/04/2022   Contusion of buttock 01/15/2022   S/P cervical spinal fusion 12/12/2021   Neuropathic pain, leg, right 12/12/2021   Insomnia due to medical condition 11/14/2021   Clavicle pain 09/26/2021   Lumbar radiculopathy 08/31/2021   Family history of prostate cancer 06/08/2020   MDD (major depressive disorder), recurrent, in full remission 12/31/2019   Bereavement 12/31/2019   GAD (generalized anxiety disorder) 08/05/2019   Insomnia due to mental disorder 08/05/2019   Orthostasis 05/28/2019   Allergic rhinitis 05/26/2019   OSA on CPAP 05/19/2019   Chronic kidney disease (CKD), stage III (moderate) (HCC) 05/19/2019   Weakness of right leg 05/10/2019   Weakness of right arm 05/10/2019   S/P TKR (total knee replacement) using cement, right 10/08/2018   Hypokalemia 09/30/2018   CAD (coronary artery disease) 03/27/2018   Unstable angina (HCC) 03/24/2018   Skin lesions 10/16/2017   BPH (benign prostatic hyperplasia) 10/16/2017   Advanced care planning/counseling discussion 02/05/2017   Polycythemia, secondary 08/28/2016  Knee pain, right 08/06/2016   Elevated PSA 08/06/2016   Hypogonadism in male 11/17/2015   Hyperlipidemia 11/17/2015   Essential hypertension 11/17/2015   Erectile dysfunction 03/07/2015   Arthritis of knee 12/18/2011   Primary localized osteoarthrosis, lower leg 11/21/2011   PCP: Vicci Duwaine SQUIBB, DO  REFERRING PROVIDER: Marjorie Leisure, MD   REFERRING DIAG:  G95.9 (ICD-10-CM) - Myelopathy (HCC)  [phone] (ICD-10-CM) - Hip flexor tendinitis   RATIONALE FOR EVALUATION  AND TREATMENT: Rehabilitation  THERAPY DIAG: Unsteadiness on feet  Muscle weakness (generalized)  ONSET DATE: 2020  FOLLOW-UP APPT SCHEDULED WITH REFERRING PROVIDER: No   FROM INITIAL EVALUATION SUBJECTIVE:                                                                                                                                                                                         SUBJECTIVE STATEMENT:  RLE weakness and unsteadiness  PERTINENT HISTORY:  Pt states that he has been active for multiple years with triathalons and long distance cycling. In the summer of 2020 he began falling and losing and his balance. He was referred to [facility] neurosurgery and saw Dr. 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 He states his UE strength improved after the surgery but his RLE strength never improved. He is unsteady, drags his RLE occasionally, and wobbles. Pt reports >20 falls in the last 6 months. He underwent PT 3-4 years ago without significant improvement in his RLE strength. Pt also has a hx of a lower lumbar fusion due to compression on L5 and he still gets some pain from the R lateral lower leg to the top of the foot.   Pain: Yes, R lower leg to top of the foot Numbness/Tingling: Yes, tingling and numbness in the R lower leg and foot Focal Weakness: Yes, R hip flexor weakness and R dorsiflexor weakness; Recent changes in overall health/medication: No Prior history of physical therapy for balance:  Yes, after cervical fusion; Dominant hand: right Imaging: Yes, see history; Red flags: Negative for bowel/bladder changes, saddle paresthesia, h/o compression fx, h/o abdominal aneurysm, abdominal pain, chills/fever, night sweats, nausea, vomiting, and unexplained weight gain/loss.  PRECAUTIONS: None  WEIGHT BEARING RESTRICTIONS: No  FALLS: Has patient fallen in last 6 months? Yes. Number of falls >20 falls,   Living  Environment Lives with: lives with their spouse Lives in: House/apartment, townhouse Stairs: No  Prior level of function: Independent  Occupational demands: Retired financial planner  Hobbies: Cycling  Patient Goals: Increase RLE strength   OBJECTIVE:   Patient Surveys  ABC: To be completed LEFS: To  be completed  Cognition Patient is oriented to person, place, and time.  Recent memory is intact.  Remote memory is intact.  Attention span and concentration are intact.  Expressive speech is intact.  Patient's fund of knowledge is within normal limits for educational level.    Gross Musculoskeletal Assessment Tremor: None Bulk: Normal Tone: Not tested  Posture: Deferred  AROM Deferred  LE MMT: MMT (out of 5) Right  Left   Hip flexion 4 5  Hip extension 3+ 3+  Hip abduction 3+ 4  Hip adduction 4+ 5  Hip internal rotation 4+ 5  Hip external rotation 4+ 4+  Knee flexion 5 4  Knee extension 5 5  Ankle dorsiflexion 4+ 4+  Ankle plantarflexion 10 reps 10 reps  Ankle inversion 5 5  Ankle eversion 5 5  (* = pain; Blank rows = not tested)  Reflexes R/L Knee Jerk (L3/4): 2+/2+  Ankle Jerk (S1/2): 1+/1+   Sensation Grossly diminished throughout RLE light touch throughout as determined by testing dermatomes L2-S1. Proprioception, stereognosis, and hot/cold testing deferred on this date.  Cranial Nerves Deferred  Coordination/Cerebellar Deferred  Bed mobility: Deferred  Transfers: Assistive device utilized: None  Sit to stand: Complete Independence Stand to sit: Complete Independence Chair to chair: Complete Independence Floor: Deferred  Curb:  Deferred  Stairs: Level of Assistance: Modified independence Stair Negotiation Technique: Alternating Pattern  with Bilateral Rails Number of Stairs: 4 x 2   Height of Stairs: 6  Comments: Mild instability noted requiring rail assistance but no overt LOB requiring external assistance  Gait: Full gait  assessment deferred, some mild instability noted but easily self-corrected;  Functional Outcome Measures  09/29/24 Comments  BERG 52/56   DGI    DGI 22/24   LEFS 45/80   5TSTS 11.6s   6 Minute Walk Test    ABC 68.1%   (Blank rows = not tested)   TODAY'S TREATMENT 10/15/24  SUBJECTIVE: Pt reports that he is doing well today. No changes since the last therapy session. No falls since the last appt. No specific questions or concerns.   PAIN: Denies  Therapeutic Activity  NuStep L2-6 BLE only x 8 minutes at start of session for strengthening during interval history; 6 lateral step downs 2 x 10 BLE  6 fwd step downs 2 x 10 BLE with BUE support  Total Gym L22 double leg squats 2 x 10; TG L22 R/L single leg squats 2 x 20; Sidestepping with GTB around knees ~12' x 4    Not performed Supine SLR with 5# AW 2 x 15 6 lateral step downs 2 x 10 BLE; 6 step heel raises 2 x 20 biasing RLE; Prostretch calf stretch 2 x 30s; Resisted side stepping with blue tband around knees x multiple lengths;   PATIENT EDUCATION:  Education details: Exercise form/technique; Person educated: Patient Education method: Programmer, Multimedia, Demonstration, Verbal cues, and Handouts Education comprehension: verbalized understanding, returned demonstration, and needs further education   HOME EXERCISE PROGRAM:  Access Code: [email or web address] URL: [email or web address] Date: 09/29/2024 Prepared by: Selinda Eck  Exercises - Seated Gluteal Sets  - 1 x daily - 7 x weekly - 3 sets - 10 reps - 3s hold - Supine Straight Leg Raises (Mirrored)  - 1 x daily - 7 x weekly - 3 sets - 10 reps - 3s hold - Side Leg Lifts (Mirrored)  - 1 x daily - 7 x weekly - 3 sets - 10 reps - 3s hold   ASSESSMENT:  CLINICAL  IMPRESSION:  Progressed RLE strength exercises during session today with patient. Continues to have difficulty with hip abductor activities with resistance. No HEP modifications at this time. Pt encouraged  to follow-up as scheduled. He will benefit from PT services to address deficits in strength, balance, and mobility in order to improve function at home and decrease his risk for falls.    OBJECTIVE IMPAIRMENTS: decreased balance, difficulty walking, and decreased strength.   ACTIVITY LIMITATIONS: squatting and stairs  PARTICIPATION LIMITATIONS: community activity and cycling  PERSONAL FACTORS: Age, Time since onset of injury/illness/exacerbation, and 3+ comorbidities: Depression, OA, GAD, and neuropathic pain are also affecting patient's functional outcome.   REHAB POTENTIAL: Fair    CLINICAL DECISION MAKING: Evolving/moderate complexity  EVALUATION COMPLEXITY: Moderate   GOALS: Goals reviewed with patient? No  SHORT TERM GOALS: Target date: 10/22/2024  Pt will be independent with HEP in order to improve strength and balance to decrease fall risk and improve function at home and with leisure activities such as cycling Baseline:  Goal status: INITIAL   LONG TERM GOALS: Target date: 11/19/2024  Pt will increase LEFS by at least 9 points in order to demonstrate significant improvement in lower extremity function.   Baseline: 09/29/24: 45/80 Goal status: INITIAL  2.  Pt will improve BERG by at least 3 points in order to demonstrate clinically significant improvement in balance.   Baseline: 09/29/24: 52/56 Goal status: DISCONTINUED  3.  Pt will improve ABC by at least 13% in order to demonstrate clinically significant improvement in balance confidence.      Baseline: 09/29/24: 68.1% Goal status: INITIAL  4.  Pt will increase strength of R hip flexion and abduction to at least 4+/5 grade in order to demonstrate improvement in strength and function.     Baseline: flexion: 4/5, abduction: 3+/5; Goal status: INITIAL   PLAN: PT FREQUENCY: 1-2x/week  PT DURATION: 8 weeks  PLANNED INTERVENTIONS: Therapeutic exercises, Therapeutic activity, Neuromuscular re-education, Balance training,  Gait training, Patient/Family education, Self Care, Joint mobilization, Joint manipulation, Vestibular training, Canalith repositioning, Orthotic/Fit training, DME instructions, Dry Needling, Electrical stimulation, Spinal manipulation, Spinal mobilization, Cryotherapy, Moist heat, Taping, Traction, Ultrasound, Ionotophoresis 4mg /ml Dexamethasone , Manual therapy, and Re-evaluation.  PLAN FOR NEXT SESSION: gait assessment, consider digital dynamometry and Mini BESTest, progress balance and strengthening;    Maryanne Finder, PT, DPT Physical Therapist - [place]  [facility] Clint Biello A [place] 10/15/2024, 2:01 PM  "

## 2024-10-16 ENCOUNTER — Ambulatory Visit: Payer: Self-pay | Admitting: Family Medicine

## 2024-10-16 LAB — CBC WITH DIFFERENTIAL/PLATELET
Basophils Absolute: 0 10*3/uL (ref 0.0–0.2)
Basos: 0 %
EOS (ABSOLUTE): 0.1 10*3/uL (ref 0.0–0.4)
Eos: 1 %
Hematocrit: 55.2 % — ABNORMAL HIGH (ref 37.5–51.0)
Hemoglobin: 19 g/dL — ABNORMAL HIGH (ref 13.0–17.7)
Immature Grans (Abs): 0 10*3/uL (ref 0.0–0.1)
Immature Granulocytes: 0 %
Lymphocytes Absolute: 1.4 10*3/uL (ref 0.7–3.1)
Lymphs: 17 %
MCH: 32.7 pg (ref 26.6–33.0)
MCHC: 34.4 g/dL (ref 31.5–35.7)
MCV: 95 fL (ref 79–97)
Monocytes Absolute: 0.8 10*3/uL (ref 0.1–0.9)
Monocytes: 10 %
Neutrophils Absolute: 5.7 10*3/uL (ref 1.4–7.0)
Neutrophils: 72 %
Platelets: 229 10*3/uL (ref 150–450)
RBC: 5.81 x10E6/uL — ABNORMAL HIGH (ref 4.14–5.80)
RDW: 14.1 % (ref 11.6–15.4)
WBC: 8 10*3/uL (ref 3.4–10.8)

## 2024-10-16 LAB — COMPREHENSIVE METABOLIC PANEL WITH GFR
ALT: 25 [IU]/L (ref 0–44)
AST: 24 [IU]/L (ref 0–40)
Albumin: 4.3 g/dL (ref 3.8–4.8)
Alkaline Phosphatase: 64 [IU]/L (ref 47–123)
BUN/Creatinine Ratio: 13 (ref 10–24)
BUN: 17 mg/dL (ref 8–27)
Bilirubin Total: 0.6 mg/dL (ref 0.0–1.2)
CO2: 22 mmol/L (ref 20–29)
Calcium: 10.3 mg/dL — ABNORMAL HIGH (ref 8.6–10.2)
Chloride: 97 mmol/L (ref 96–106)
Creatinine, Ser: 1.35 mg/dL — ABNORMAL HIGH (ref 0.76–1.27)
Globulin, Total: 2.1 g/dL (ref 1.5–4.5)
Glucose: 113 mg/dL — ABNORMAL HIGH (ref 70–99)
Potassium: 3.7 mmol/L (ref 3.5–5.2)
Sodium: 138 mmol/L (ref 134–144)
Total Protein: 6.4 g/dL (ref 6.0–8.5)
eGFR: 55 mL/min/{1.73_m2} — ABNORMAL LOW

## 2024-10-16 LAB — TSH: TSH: 1.33 u[IU]/mL (ref 0.450–4.500)

## 2024-10-16 LAB — MAGNESIUM: Magnesium: 1.9 mg/dL (ref 1.6–2.3)

## 2024-10-16 LAB — PHOSPHORUS: Phosphorus: 3.4 mg/dL (ref 2.8–4.1)

## 2024-10-16 LAB — VITAMIN D 25 HYDROXY (VIT D DEFICIENCY, FRACTURES): Vit D, 25-Hydroxy: 53.3 ng/mL (ref 30.0–100.0)

## 2024-10-16 NOTE — Telephone Encounter (Signed)
 Requested Prescriptions  Pending Prescriptions Disp Refills   dutasteride  (AVODART ) 0.5 MG capsule [Pharmacy Med Name: DUTASTERIDE  0.5 MG CAP] 90 capsule 1    Sig: TAKE 1 CAPSULE BY MOUTH EVERY EVENING     Urology: 5-alpha Reductase Inhibitors Passed - 10/16/2024 12:03 PM      Passed - PSA in normal range and within 360 days    Prostate Specific Ag, Serum  Date Value Ref Range Status  06/11/2024 3.6 0.0 - 4.0 ng/mL Final    Comment:    Roche ECLIA methodology. According to the American Urological Association, Serum PSA should decrease and remain at undetectable levels after radical prostatectomy. The AUA defines biochemical recurrence as an initial PSA value 0.2 ng/mL or greater followed by a subsequent confirmatory PSA value 0.2 ng/mL or greater. Values obtained with different assay methods or kits cannot be used interchangeably. Results cannot be interpreted as absolute evidence of the presence or absence of malignant disease.          Passed - Valid encounter within last 12 months    Recent Outpatient Visits           Yesterday Dizziness   [place] [facility] [place], [name], DO   4 months ago Mixed hyperlipidemia   [facility] [place], [name], DO   10 months ago Encounter for [employer] annual wellness exam   [facility] [place], [place], DO   11 months ago [employer]   [place] [facility] [place], Bandon, DO   11 months ago Boil    [facility] Melvin Pao, NP       Future Appointments             In 5 months Hester Alm BROCKS, MD [facility]   In 8 months [name], Glendia BROCKS, MD [facility]

## 2024-10-20 ENCOUNTER — Ambulatory Visit

## 2024-10-20 DIAGNOSIS — R2681 Unsteadiness on feet: Secondary | ICD-10-CM

## 2024-10-20 DIAGNOSIS — M6281 Muscle weakness (generalized): Secondary | ICD-10-CM

## 2024-10-22 ENCOUNTER — Ambulatory Visit

## 2024-10-27 ENCOUNTER — Ambulatory Visit

## 2024-10-29 ENCOUNTER — Ambulatory Visit

## 2024-11-02 ENCOUNTER — Ambulatory Visit

## 2024-11-03 ENCOUNTER — Ambulatory Visit

## 2024-11-05 ENCOUNTER — Ambulatory Visit

## 2024-11-30 ENCOUNTER — Encounter: Admitting: Family Medicine

## 2024-12-11 ENCOUNTER — Other Ambulatory Visit

## 2025-01-28 ENCOUNTER — Ambulatory Visit: Admitting: Psychiatry

## 2025-04-06 ENCOUNTER — Ambulatory Visit: Admitting: Dermatology

## 2025-06-04 ENCOUNTER — Other Ambulatory Visit

## 2025-06-14 ENCOUNTER — Ambulatory Visit: Admitting: Urology
